# Patient Record
Sex: Male | Born: 1955 | Race: Black or African American | Hispanic: No | Marital: Married | Smoking: Former smoker
Health system: Southern US, Community
[De-identification: ages and names within clinical notes are randomized; demographics above are authoritative.]

## PROBLEM LIST (undated history)

## (undated) DIAGNOSIS — C801 Malignant (primary) neoplasm, unspecified: Secondary | ICD-10-CM

## (undated) DIAGNOSIS — N400 Enlarged prostate without lower urinary tract symptoms: Secondary | ICD-10-CM

## (undated) DIAGNOSIS — F419 Anxiety disorder, unspecified: Secondary | ICD-10-CM

## (undated) DIAGNOSIS — N390 Urinary tract infection, site not specified: Secondary | ICD-10-CM

## (undated) DIAGNOSIS — I10 Essential (primary) hypertension: Secondary | ICD-10-CM

## (undated) DIAGNOSIS — E785 Hyperlipidemia, unspecified: Secondary | ICD-10-CM

## (undated) DIAGNOSIS — N529 Male erectile dysfunction, unspecified: Secondary | ICD-10-CM

## (undated) DIAGNOSIS — G4730 Sleep apnea, unspecified: Secondary | ICD-10-CM

## (undated) DIAGNOSIS — R011 Cardiac murmur, unspecified: Secondary | ICD-10-CM

## (undated) DIAGNOSIS — E119 Type 2 diabetes mellitus without complications: Secondary | ICD-10-CM

## (undated) HISTORY — DX: Malignant (primary) neoplasm, unspecified: C80.1

## (undated) HISTORY — DX: Urinary tract infection, site not specified: N39.0

## (undated) HISTORY — DX: Cardiac murmur, unspecified: R01.1

## (undated) HISTORY — PX: COLONOSCOPY: SHX174

## (undated) HISTORY — DX: Anxiety disorder, unspecified: F41.9

## (undated) HISTORY — DX: Hyperlipidemia, unspecified: E78.5

---

## 1987-06-01 HISTORY — PX: FOOT BONE EXCISION: SUR493

## 2004-03-07 ENCOUNTER — Emergency Department (HOSPITAL_COMMUNITY): Admission: EM | Admit: 2004-03-07 | Discharge: 2004-03-07 | Payer: Self-pay

## 2004-05-31 DIAGNOSIS — C801 Malignant (primary) neoplasm, unspecified: Secondary | ICD-10-CM

## 2004-05-31 HISTORY — DX: Malignant (primary) neoplasm, unspecified: C80.1

## 2004-05-31 HISTORY — PX: BRAIN SURGERY: SHX531

## 2004-12-02 ENCOUNTER — Ambulatory Visit (HOSPITAL_BASED_OUTPATIENT_CLINIC_OR_DEPARTMENT_OTHER): Admission: RE | Admit: 2004-12-02 | Discharge: 2004-12-02 | Payer: Self-pay | Admitting: Family Medicine

## 2004-12-13 ENCOUNTER — Ambulatory Visit: Payer: Self-pay | Admitting: Internal Medicine

## 2005-04-30 ENCOUNTER — Ambulatory Visit: Admission: RE | Admit: 2005-04-30 | Discharge: 2005-07-21 | Payer: Self-pay | Admitting: Radiation Oncology

## 2005-07-16 ENCOUNTER — Ambulatory Visit (HOSPITAL_COMMUNITY): Admission: RE | Admit: 2005-07-16 | Discharge: 2005-07-16 | Payer: Self-pay | Admitting: Radiation Oncology

## 2005-08-16 ENCOUNTER — Ambulatory Visit: Admission: RE | Admit: 2005-08-16 | Discharge: 2005-08-27 | Payer: Self-pay | Admitting: Radiation Oncology

## 2005-09-16 ENCOUNTER — Ambulatory Visit: Admission: RE | Admit: 2005-09-16 | Discharge: 2005-10-11 | Payer: Self-pay | Admitting: Radiation Oncology

## 2005-09-17 ENCOUNTER — Ambulatory Visit (HOSPITAL_COMMUNITY): Admission: RE | Admit: 2005-09-17 | Discharge: 2005-09-17 | Payer: Self-pay | Admitting: Radiation Oncology

## 2005-09-17 LAB — CBC WITH DIFFERENTIAL/PLATELET
BASO%: 0.3 % (ref 0.0–2.0)
Eosinophils Absolute: 0.1 10*3/uL (ref 0.0–0.5)
MCH: 26.7 pg — ABNORMAL LOW (ref 28.0–33.4)
MCHC: 32.9 g/dL (ref 32.0–35.9)
MCV: 81.1 fL — ABNORMAL LOW (ref 81.6–98.0)
NEUT#: 3.3 10*3/uL (ref 1.5–6.5)
NEUT%: 65.4 % (ref 40.0–75.0)
RBC: 5.39 10*6/uL (ref 4.20–5.71)
RDW: 14.1 % (ref 11.2–14.6)

## 2005-09-27 LAB — COMPREHENSIVE METABOLIC PANEL
Alkaline Phosphatase: 40 U/L (ref 39–117)
CO2: 25 mEq/L (ref 19–32)
Chloride: 105 mEq/L (ref 96–112)
Glucose, Bld: 89 mg/dL (ref 70–99)
Sodium: 140 mEq/L (ref 135–145)
Total Bilirubin: 0.5 mg/dL (ref 0.3–1.2)

## 2005-09-28 LAB — CBC WITH DIFFERENTIAL/PLATELET
Basophils Absolute: 0 10*3/uL (ref 0.0–0.1)
EOS%: 0.9 % (ref 0.0–7.0)
Eosinophils Absolute: 0 10*3/uL (ref 0.0–0.5)
HCT: 44.2 % (ref 38.7–49.9)
LYMPH%: 22.9 % (ref 14.0–48.0)
MONO#: 0.3 10*3/uL (ref 0.1–0.9)
MONO%: 6.3 % (ref 0.0–13.0)
NEUT#: 3.8 10*3/uL (ref 1.5–6.5)
Platelets: 273 10*3/uL (ref 145–400)
WBC: 5.4 10*3/uL (ref 4.0–10.0)

## 2005-10-26 ENCOUNTER — Ambulatory Visit: Admission: RE | Admit: 2005-10-26 | Discharge: 2005-12-03 | Payer: Self-pay | Admitting: Radiation Oncology

## 2005-10-26 LAB — CBC WITH DIFFERENTIAL/PLATELET
EOS%: 1.1 % (ref 0.0–7.0)
LYMPH%: 20.9 % (ref 14.0–48.0)
MCH: 26.7 pg — ABNORMAL LOW (ref 28.0–33.4)
MCV: 80.8 fL — ABNORMAL LOW (ref 81.6–98.0)
MONO#: 0.6 10*3/uL (ref 0.1–0.9)
RBC: 5.28 10*6/uL (ref 4.20–5.71)
RDW: 14.9 % — ABNORMAL HIGH (ref 11.2–14.6)
WBC: 6.2 10*3/uL (ref 4.0–10.0)
lymph#: 1.3 10*3/uL (ref 0.9–3.3)

## 2005-11-22 LAB — COMPREHENSIVE METABOLIC PANEL
ALT: 30 U/L (ref 0–40)
Albumin: 4.7 g/dL (ref 3.5–5.2)
CO2: 27 mEq/L (ref 19–32)
Chloride: 104 mEq/L (ref 96–112)
Glucose, Bld: 91 mg/dL (ref 70–99)
Sodium: 141 mEq/L (ref 135–145)
Total Bilirubin: 0.4 mg/dL (ref 0.3–1.2)
Total Protein: 7 g/dL (ref 6.0–8.3)

## 2005-11-22 LAB — CBC WITH DIFFERENTIAL/PLATELET
BASO%: 0.3 % (ref 0.0–2.0)
Basophils Absolute: 0 10*3/uL (ref 0.0–0.1)
HCT: 44.2 % (ref 38.7–49.9)
MCH: 27.1 pg — ABNORMAL LOW (ref 28.0–33.4)
MCHC: 33.6 g/dL (ref 32.0–35.9)
MONO%: 10.4 % (ref 0.0–13.0)
RBC: 5.47 10*6/uL (ref 4.20–5.71)
RDW: 14.9 % — ABNORMAL HIGH (ref 11.2–14.6)
WBC: 4.9 10*3/uL (ref 4.0–10.0)
lymph#: 1.4 10*3/uL (ref 0.9–3.3)

## 2005-11-29 ENCOUNTER — Ambulatory Visit (HOSPITAL_COMMUNITY): Admission: RE | Admit: 2005-11-29 | Discharge: 2005-11-29 | Payer: Self-pay | Admitting: Radiation Oncology

## 2005-11-29 LAB — CBC WITH DIFFERENTIAL/PLATELET
EOS%: 0.8 % (ref 0.0–7.0)
MCHC: 33.9 g/dL (ref 32.0–35.9)
MCV: 80.4 fL — ABNORMAL LOW (ref 81.6–98.0)
MONO#: 0.5 10*3/uL (ref 0.1–0.9)
Platelets: 275 10*3/uL (ref 145–400)
lymph#: 1.3 10*3/uL (ref 0.9–3.3)

## 2005-12-21 ENCOUNTER — Ambulatory Visit: Admission: RE | Admit: 2005-12-21 | Discharge: 2006-01-19 | Payer: Self-pay | Admitting: Radiation Oncology

## 2005-12-21 LAB — HEPATIC FUNCTION PANEL
ALT: 30 U/L (ref 0–40)
AST: 21 U/L (ref 0–37)
Albumin: 4.7 g/dL (ref 3.5–5.2)
Bilirubin, Direct: 0.1 mg/dL (ref 0.0–0.3)
Total Bilirubin: 0.4 mg/dL (ref 0.3–1.2)

## 2005-12-21 LAB — CBC WITH DIFFERENTIAL/PLATELET
Basophils Absolute: 0 10*3/uL (ref 0.0–0.1)
Eosinophils Absolute: 0.1 10*3/uL (ref 0.0–0.5)
HGB: 15 g/dL (ref 13.0–17.1)
LYMPH%: 25.8 % (ref 14.0–48.0)
MCV: 81 fL — ABNORMAL LOW (ref 81.6–98.0)
NEUT#: 3.2 10*3/uL (ref 1.5–6.5)
NEUT%: 64.2 % (ref 40.0–75.0)
Platelets: 334 10*3/uL (ref 145–400)
RBC: 5.44 10*6/uL (ref 4.20–5.71)
RDW: 14.6 % (ref 11.2–14.6)
WBC: 5.1 10*3/uL (ref 4.0–10.0)
lymph#: 1.3 10*3/uL (ref 0.9–3.3)

## 2005-12-28 LAB — CBC WITH DIFFERENTIAL/PLATELET
Basophils Absolute: 0 10*3/uL (ref 0.0–0.1)
HCT: 42.7 % (ref 38.7–49.9)
LYMPH%: 25.4 % (ref 14.0–48.0)
MCH: 27.1 pg — ABNORMAL LOW (ref 28.0–33.4)
MONO#: 0.5 10*3/uL (ref 0.1–0.9)
NEUT#: 3.1 10*3/uL (ref 1.5–6.5)
NEUT%: 62.9 % (ref 40.0–75.0)
RDW: 14.9 % — ABNORMAL HIGH (ref 11.2–14.6)
WBC: 5 10*3/uL (ref 4.0–10.0)
lymph#: 1.3 10*3/uL (ref 0.9–3.3)

## 2006-07-02 ENCOUNTER — Emergency Department (HOSPITAL_COMMUNITY): Admission: EM | Admit: 2006-07-02 | Discharge: 2006-07-02 | Payer: Self-pay | Admitting: Emergency Medicine

## 2008-05-31 HISTORY — PX: OTHER SURGICAL HISTORY: SHX169

## 2009-03-20 DIAGNOSIS — E291 Testicular hypofunction: Secondary | ICD-10-CM | POA: Insufficient documentation

## 2009-03-24 DIAGNOSIS — M25561 Pain in right knee: Secondary | ICD-10-CM | POA: Insufficient documentation

## 2009-06-23 DIAGNOSIS — R3121 Asymptomatic microscopic hematuria: Secondary | ICD-10-CM | POA: Insufficient documentation

## 2010-06-17 DIAGNOSIS — Z008 Encounter for other general examination: Secondary | ICD-10-CM | POA: Insufficient documentation

## 2010-06-21 ENCOUNTER — Encounter: Payer: Self-pay | Admitting: Radiation Oncology

## 2010-12-25 ENCOUNTER — Ambulatory Visit: Payer: Self-pay | Admitting: Internal Medicine

## 2010-12-30 ENCOUNTER — Ambulatory Visit: Payer: Self-pay | Admitting: Internal Medicine

## 2011-01-29 ENCOUNTER — Ambulatory Visit: Payer: Self-pay | Admitting: Internal Medicine

## 2011-02-03 ENCOUNTER — Ambulatory Visit: Payer: Self-pay | Admitting: Internal Medicine

## 2011-03-01 ENCOUNTER — Ambulatory Visit: Payer: Self-pay | Admitting: Internal Medicine

## 2011-04-01 ENCOUNTER — Ambulatory Visit: Payer: Self-pay | Admitting: Internal Medicine

## 2011-05-01 ENCOUNTER — Ambulatory Visit: Payer: Self-pay | Admitting: Internal Medicine

## 2011-06-01 ENCOUNTER — Ambulatory Visit: Payer: Self-pay | Admitting: Internal Medicine

## 2011-07-06 ENCOUNTER — Ambulatory Visit: Payer: Self-pay | Admitting: Internal Medicine

## 2011-07-06 LAB — CBC CANCER CENTER
Basophil #: 0 x10 3/mm (ref 0.0–0.1)
Eosinophil #: 0 x10 3/mm (ref 0.0–0.7)
HCT: 49.4 % (ref 40.0–52.0)
Lymphocyte #: 1.9 x10 3/mm (ref 1.0–3.6)
MCHC: 32.3 g/dL (ref 32.0–36.0)
MCV: 79 fL — ABNORMAL LOW (ref 80–100)
Neutrophil #: 3.2 x10 3/mm (ref 1.4–6.5)
RDW: 15.1 % — ABNORMAL HIGH (ref 11.5–14.5)

## 2011-07-30 ENCOUNTER — Ambulatory Visit: Payer: Self-pay | Admitting: Oncology

## 2011-07-30 ENCOUNTER — Ambulatory Visit: Payer: Self-pay | Admitting: Internal Medicine

## 2011-08-06 LAB — CBC CANCER CENTER
Basophil %: 1.5 %
Eosinophil %: 0.5 %
HGB: 16.2 g/dL (ref 13.0–18.0)
Lymphocyte #: 2.1 x10 3/mm (ref 1.0–3.6)
MCH: 26 pg (ref 26.0–34.0)
MCV: 79 fL — ABNORMAL LOW (ref 80–100)
Monocyte #: 0.6 x10 3/mm (ref 0.0–0.7)
Neutrophil %: 58.9 %
RBC: 6.23 10*6/uL — ABNORMAL HIGH (ref 4.40–5.90)
WBC: 7 x10 3/mm (ref 3.8–10.6)

## 2011-08-28 ENCOUNTER — Emergency Department (INDEPENDENT_AMBULATORY_CARE_PROVIDER_SITE_OTHER)
Admission: EM | Admit: 2011-08-28 | Discharge: 2011-08-28 | Disposition: A | Discharge status: Home / Self Care | Source: Home / Self Care | Attending: Family Medicine | Admitting: Family Medicine

## 2011-08-28 ENCOUNTER — Encounter (HOSPITAL_COMMUNITY): Payer: Self-pay

## 2011-08-28 DIAGNOSIS — J069 Acute upper respiratory infection, unspecified: Secondary | ICD-10-CM

## 2011-08-28 LAB — POCT URINALYSIS DIP (DEVICE)
Bilirubin Urine: NEGATIVE
Glucose, UA: NEGATIVE mg/dL
Ketones, ur: NEGATIVE mg/dL
Specific Gravity, Urine: 1.025 (ref 1.005–1.030)

## 2011-08-28 NOTE — ED Provider Notes (Signed)
History     CSN: [id number]  Arrival date & time 08/28/11  1412   First MD Initiated Contact with Patient 08/28/11 1415      Chief Complaint  Patient presents with  . Influenza    (Consider location/radiation/quality/duration/timing/severity/associated sxs/prior treatment) HPI Comments: Leeandre presents for evaluation of feeling hot and cold, runny nose, minimal cough, and body aches. All week. He and his wife report, that they've been using over-the-counter preparations without much relief.  Patient is a 56 y.o. male presenting with URI. The history is provided by the patient.  URI The primary symptoms include fever, cough and myalgias. The current episode started 6 to 7 days ago. This is a new problem. The problem has not changed since onset. Symptoms associated with the illness include chills, congestion and rhinorrhea.    History reviewed. No pertinent past medical history.  Past Surgical History  Procedure Date  . Brain surgery     History reviewed. No pertinent family history.  History  Substance Use Topics  . Smoking status: Never Smoker   . Smokeless tobacco: Not on file  . Alcohol Use: No      Review of Systems  Constitutional: Positive for fever and chills.  HENT: Positive for congestion and rhinorrhea.   Eyes: Negative.   Respiratory: Positive for cough.   Cardiovascular: Negative.   Gastrointestinal: Negative.   Genitourinary: Negative.   Musculoskeletal: Positive for myalgias.  Skin: Negative.   Neurological: Negative.     Allergies  Review of patient's allergies indicates no known allergies.  Home Medications   Current Outpatient Rx  Name Route Sig Dispense Refill  . ASPIRIN 81 MG PO TABS Oral Take 81 mg by mouth daily.    . FENOFIBRATE 54 MG PO TABS Oral Take 54 mg by mouth daily.    . OMEGA-3 FATTY ACIDS 1000 MG PO CAPS Oral Take 2 g by mouth daily.    Marland Kitchen SIMVASTATIN 10 MG PO TABS Oral Take 10 mg by mouth at bedtime.    Marland Kitchen VALSARTAN 160 MG  PO TABS Oral Take 160 mg by mouth daily.      BP 125/79  Pulse 65  Temp(Src) 98.7 F (37.1 C) (Oral)  Resp 18  SpO2 100%  Physical Exam  Nursing note and vitals reviewed. Constitutional: He is oriented to person, place, and time. He appears well-developed and well-nourished.  HENT:  Head: Normocephalic and atraumatic.  Right Ear: Tympanic membrane is retracted.  Left Ear: Tympanic membrane is retracted.  Mouth/Throat: Uvula is midline, oropharynx is clear and moist and mucous membranes are normal.  Eyes: EOM are normal.  Neck: Normal range of motion.  Cardiovascular: Normal rate, regular rhythm, S1 normal, S2 normal and normal heart sounds.   No murmur heard. Pulmonary/Chest: Effort normal and breath sounds normal. He has no decreased breath sounds. He has no wheezes. He has no rhonchi.  Musculoskeletal: Normal range of motion.  Neurological: He is alert and oriented to person, place, and time.  Skin: Skin is warm and dry.  Psychiatric: His behavior is normal.    ED Course  Procedures (including critical care time)  Labs Reviewed  POCT URINALYSIS DIP (DEVICE) - Abnormal; Notable for the following:    Hgb urine dipstick MODERATE (*)    All other components within normal limits   No results found.   1. URI (upper respiratory infection)       MDM  Labs reviewed; advised to follow up with PCP regarding urinary sx; last prostate  exam was a year ago and he was told it was normal; continue supportive care for URI       Renaee Munda, MD 08/28/11 1640

## 2011-08-28 NOTE — Discharge Instructions (Signed)
Your urine showed a "moderate amount of hemoglobin." This may mean nothing. If your increased urination persists, please follow up with your PCP or urologist for for further evaluation. For your upper respiratory infection, I recommend aggressive fever control with acetaminophen (Tylenol) and/or ibuprofen. You may use these together, alternating them every 4 hours, or individually, every 8 hours. For example, take acetaminophen 500 to 1000 mg at 12 noon, then 600 to 800 mg of ibuprofen at 4 pm, then acetaminophen at 8 pm, etc. Also, stay hydrated with clear liquids. Return to care should your symptoms not improve, or worsen in any way.

## 2011-08-28 NOTE — ED Notes (Signed)
Pt has had cough, fever, runny nose, chills and bodyaches since Monday

## 2011-08-30 ENCOUNTER — Ambulatory Visit: Payer: Self-pay | Admitting: Internal Medicine

## 2011-08-30 ENCOUNTER — Ambulatory Visit: Payer: Self-pay | Admitting: Oncology

## 2011-09-06 ENCOUNTER — Encounter (HOSPITAL_COMMUNITY): Payer: Self-pay | Admitting: *Deleted

## 2011-09-06 ENCOUNTER — Emergency Department (HOSPITAL_COMMUNITY)
Admission: EM | Admit: 2011-09-06 | Discharge: 2011-09-06 | Disposition: A | Discharge status: Home / Self Care | Attending: Emergency Medicine | Admitting: Emergency Medicine

## 2011-09-06 DIAGNOSIS — S61012A Laceration without foreign body of left thumb without damage to nail, initial encounter: Secondary | ICD-10-CM

## 2011-09-06 DIAGNOSIS — I10 Essential (primary) hypertension: Secondary | ICD-10-CM | POA: Insufficient documentation

## 2011-09-06 DIAGNOSIS — Z7982 Long term (current) use of aspirin: Secondary | ICD-10-CM | POA: Insufficient documentation

## 2011-09-06 DIAGNOSIS — S61209A Unspecified open wound of unspecified finger without damage to nail, initial encounter: Secondary | ICD-10-CM | POA: Insufficient documentation

## 2011-09-06 DIAGNOSIS — W278XXA Contact with other nonpowered hand tool, initial encounter: Secondary | ICD-10-CM | POA: Insufficient documentation

## 2011-09-06 HISTORY — DX: Essential (primary) hypertension: I10

## 2011-09-06 MED ORDER — TETANUS-DIPHTH-ACELL PERTUSSIS 5-2.5-18.5 LF-MCG/0.5 IM SUSP
0.5000 mL | Freq: Once | INTRAMUSCULAR | Status: AC
  Administered 2011-09-06: 0.5 mL via INTRAMUSCULAR
  Filled 2011-09-06: qty 0.5

## 2011-09-06 NOTE — ED Notes (Signed)
First meeting with patient. Patient resting with NAD and waiting discharge.

## 2011-09-06 NOTE — ED Notes (Signed)
Patient complaining of laceration to left thumb; patient states that he was cutting a box open when the knife slipped and cut his finger at 10 am this morning.  Gauze removed by PA (now at bedside).  Patient does not know last tetanus shot date.  1 inch laceration noted to left thumb; no active bleeding noted.  Laceration is scabbed over and closed-up.  Patient alert and oriented x4; PERRL present.  Will continue to monitor.

## 2011-09-06 NOTE — ED Notes (Signed)
Dermabond placed at bedside for PA use; PA notified and aware.

## 2011-09-06 NOTE — Discharge Instructions (Signed)
Stitches, Staples, or Skin Adhesive Strips   Stitches (sutures), staples, and skin adhesive strips hold the skin together as it heals. They will usually be in place for 7 days or less.  HOME CARE   Wash your hands with soap and water before and after you touch your wound.   Only take medicine as told by your doctor.   Cover your wound only if your doctor told you to. Otherwise, leave it open to air.   Do not get your stitches wet or dirty. If they get dirty, dab them gently with a clean washcloth. Wet the washcloth with soapy water. Do not rub. Pat them dry gently.   Do not put medicine or medicated cream on your stitches unless your doctor told you to.   Do not take out your own stitches or staples. Skin adhesive strips will fall off by themselves.   Do not pick at the wound. Picking can cause an infection.   Do not miss your follow-up appointment.   If you have problems or questions, call your doctor.  GET HELP RIGHT AWAY IF:    You have a temperature by mouth above 102 F (38.9 C), not controlled by medicine.   You have chills.   You have redness or pain around your stitches.   There is puffiness (swelling) around your stitches.   You notice fluid (drainage) from your stitches.   There is a bad smell coming from your wound.  MAKE SURE YOU:   Understand these instructions.   Will watch your condition.   Will get help if you are not doing well or get worse.  Document Released: 03/14/2009 Document Revised: 05/06/2011 Document Reviewed: 03/14/2009  [employer] Patient Information 2012 [employer].

## 2011-09-06 NOTE — ED Provider Notes (Signed)
History     CSN: [id number]  Arrival date & time 09/06/11  2047   First MD Initiated Contact with Patient 09/06/11 2239      Chief Complaint  Patient presents with  . Laceration    (Consider location/radiation/quality/duration/timing/severity/associated sxs/prior treatment) HPI Comments: Patient here with left thumb laceration - states that the site has continued to bleed since then - occurred about 14 hours ago - denies decrease in sensation or movement - states had a meeting this morning so he did not come in sooner - tetanus is unknown  Patient is a 56 y.o. male presenting with skin laceration. The history is provided by the patient. No language interpreter was used.  Laceration  The incident occurred 6 to 12 hours ago. Pain location: left thumb. The laceration is 1 cm in size. The laceration mechanism was a a razor. The pain is at a severity of 2/10. The pain is mild. The pain has been constant since onset. He reports no foreign bodies present. His tetanus status is out of date.    Past Medical History  Diagnosis Date  . Hypertension     Past Surgical History  Procedure Date  . Brain surgery     No family history on file.  History  Substance Use Topics  . Smoking status: Never Smoker   . Smokeless tobacco: Not on file  . Alcohol Use: No      Review of Systems  Skin: Positive for wound.  All other systems reviewed and are negative.    Allergies  Review of patient's allergies indicates no known allergies.  Home Medications   Current Outpatient Rx  Name Route Sig Dispense Refill  . ASPIRIN 81 MG PO TABS Oral Take 81 mg by mouth daily.    . FENOFIBRATE 54 MG PO TABS Oral Take 54 mg by mouth daily.    . OMEGA-3 FATTY ACIDS 1000 MG PO CAPS Oral Take 2 g by mouth daily.    Marland Kitchen SIMVASTATIN 10 MG PO TABS Oral Take 10 mg by mouth at bedtime.    Marland Kitchen VALSARTAN 160 MG PO TABS Oral Take 160 mg by mouth daily.      BP 145/83  Pulse 82  Temp(Src) 98.6 F (37 C) (Oral)   Resp 17  SpO2 95%  Physical Exam  Nursing note and vitals reviewed. Constitutional: He is oriented to person, place, and time. He appears well-developed and well-nourished. No distress.  HENT:  Head: Normocephalic and atraumatic.  Right Ear: External ear normal.  Left Ear: External ear normal.  Nose: Nose normal.  Mouth/Throat: Oropharynx is clear and moist. No oropharyngeal exudate.  Eyes: Conjunctivae are normal. Pupils are equal, round, and reactive to light. No scleral icterus.  Neck: Normal range of motion. Neck supple.  Cardiovascular: Normal rate, regular rhythm and normal heart sounds.  Exam reveals no gallop and no friction rub.   No murmur heard. Pulmonary/Chest: Effort normal and breath sounds normal. No respiratory distress. He has no wheezes. He has no rales. He exhibits no tenderness.  Abdominal: Soft. Bowel sounds are normal. He exhibits no distension. There is no tenderness.  Musculoskeletal: Normal range of motion. He exhibits no edema and no tenderness.  Lymphadenopathy:    He has no cervical adenopathy.  Neurological: He is alert and oriented to person, place, and time. No cranial nerve deficit.  Skin: Skin is warm and dry. No rash noted. No erythema. No pallor.       1cm laceration to left thumb  pad - hemostatic at this time.  Psychiatric: He has a normal mood and affect. His behavior is normal. Judgment and thought content normal.    ED Course  Procedures (including critical care time)  Labs Reviewed - No data to display No results found.  LACERATION REPAIR Performed by: Patrecia Pour. Authorized by: Patrecia Pour Consent: Verbal consent obtained. Risks and benefits: risks, benefits and alternatives were discussed Consent given by: patient Patient identity confirmed: provided demographic data Prepped and Draped in normal sterile fashion Wound explored  Laceration Location: left thumb  Laceration Length: 1cm  No Foreign Bodies seen or  palpated  Anesthesia: none  Local anesthetic: none  Anesthetic total: none  Irrigation method: syringe Amount of cleaning: standard  Skin closure: dermabond and steristrips  Number of sutures: 4 1/8" steristrips  Technique: N/A  Patient tolerance: Patient tolerated the procedure well with no immediate complications.  Left thumb laceration    MDM  Patient with delayed closure of left thumb laceration - because of this and the fact that the wound is hemostatic I chose to place dermabond over the wound and place steristrips to help keep the area closed - tetanus updated.        Izola Price Bricelyn, [place] 09/06/11 2328

## 2011-09-06 NOTE — ED Notes (Signed)
The pt cut his lt thumb with a razor blade this am 1000am.  The cut has continued to bleed since then

## 2011-09-07 NOTE — ED Provider Notes (Signed)
Medical screening examination/treatment/procedure(s) were performed by non-physician practitioner and as supervising physician I was immediately available for consultation/collaboration.  Serapio Edelson R. Ryliegh Mcduffey, MD 09/07/11 2304 

## 2011-11-05 ENCOUNTER — Ambulatory Visit: Payer: Self-pay | Admitting: Oncology

## 2011-11-05 LAB — CANCER CENTER HEMOGLOBIN: HGB: 17.4 g/dL (ref 13.0–18.0)

## 2011-11-29 ENCOUNTER — Ambulatory Visit: Payer: Self-pay | Admitting: Oncology

## 2011-12-29 ENCOUNTER — Ambulatory Visit (INDEPENDENT_AMBULATORY_CARE_PROVIDER_SITE_OTHER): Admitting: *Deleted

## 2011-12-29 DIAGNOSIS — H3560 Retinal hemorrhage, unspecified eye: Secondary | ICD-10-CM | POA: Insufficient documentation

## 2011-12-29 DIAGNOSIS — H34219 Partial retinal artery occlusion, unspecified eye: Secondary | ICD-10-CM

## 2011-12-29 NOTE — Procedures (Unsigned)
CAROTID DUPLEX EXAM  INDICATION:  Retinal hemorrhage, left eye.  HISTORY: Diabetes:  No. Cardiac:  No. Hypertension:  Yes. Smoking:  Previously. Previous Surgery:  Brain surgery, cancerous tumor removed 2006. CV History:  Asymptomatic. Amaurosis Fugax No, Paresthesias No, Hemiparesis No                                      RIGHT             LEFT Brachial systolic pressure:         110               120 Brachial Doppler waveforms:         Within normal limits                Within normal limits Vertebral direction of flow:        Antegrade         Antegrade DUPLEX VELOCITIES (cm/sec) CCA peak systolic                   140               146 ECA peak systolic                   88                91 ICA peak systolic                   70                96 ICA end diastolic                   30                27 PLAQUE MORPHOLOGY:                  Heterogeneous     Heterogeneous PLAQUE AMOUNT:                      Minimal           Mild PLAQUE LOCATION:                    ICA, ECA          CCA  IMPRESSION: 1. 1-39% bilateral internal carotid artery stenosis. 2. Vertebral artery flow antegrade bilaterally.  ___________________________________________ Di Kindle. Edilia Bo, M.D.  SS/MEDQ  D:  12/29/2011  T:  12/29/2011  Job:  [id number]

## 2012-02-14 ENCOUNTER — Ambulatory Visit: Payer: Self-pay | Admitting: Oncology

## 2012-02-14 LAB — CBC CANCER CENTER
Eosinophil #: 0.1 x10 3/mm (ref 0.0–0.7)
HCT: 45.4 % (ref 40.0–52.0)
HGB: 14.5 g/dL (ref 13.0–18.0)
Lymphocyte %: 32.4 %
Monocyte #: 0.5 x10 3/mm (ref 0.2–1.0)
RDW: 15.6 % — ABNORMAL HIGH (ref 11.5–14.5)
WBC: 5.7 x10 3/mm (ref 3.8–10.6)

## 2012-02-29 ENCOUNTER — Ambulatory Visit: Payer: Self-pay | Admitting: Oncology

## 2012-03-10 ENCOUNTER — Other Ambulatory Visit: Payer: Self-pay | Admitting: Internal Medicine

## 2012-03-10 DIAGNOSIS — E041 Nontoxic single thyroid nodule: Secondary | ICD-10-CM

## 2012-03-15 ENCOUNTER — Other Ambulatory Visit

## 2012-03-17 ENCOUNTER — Ambulatory Visit
Admission: RE | Admit: 2012-03-17 | Discharge: 2012-03-17 | Disposition: A | Discharge status: Home / Self Care | Source: Ambulatory Visit | Attending: Internal Medicine | Admitting: Internal Medicine

## 2012-03-17 DIAGNOSIS — E041 Nontoxic single thyroid nodule: Secondary | ICD-10-CM

## 2012-03-20 ENCOUNTER — Other Ambulatory Visit: Payer: Self-pay | Admitting: Internal Medicine

## 2012-03-20 DIAGNOSIS — E041 Nontoxic single thyroid nodule: Secondary | ICD-10-CM

## 2012-03-30 ENCOUNTER — Other Ambulatory Visit (HOSPITAL_COMMUNITY)
Admission: RE | Admit: 2012-03-30 | Discharge: 2012-03-30 | Disposition: A | Discharge status: Home / Self Care | Source: Ambulatory Visit | Attending: Interventional Radiology | Admitting: Interventional Radiology

## 2012-03-30 ENCOUNTER — Ambulatory Visit
Admission: RE | Admit: 2012-03-30 | Discharge: 2012-03-30 | Disposition: A | Discharge status: Home / Self Care | Source: Ambulatory Visit | Attending: Internal Medicine | Admitting: Internal Medicine

## 2012-03-30 DIAGNOSIS — E049 Nontoxic goiter, unspecified: Secondary | ICD-10-CM | POA: Insufficient documentation

## 2012-03-30 DIAGNOSIS — E041 Nontoxic single thyroid nodule: Secondary | ICD-10-CM

## 2012-04-26 ENCOUNTER — Encounter (INDEPENDENT_AMBULATORY_CARE_PROVIDER_SITE_OTHER): Payer: Self-pay | Admitting: Surgery

## 2012-04-26 ENCOUNTER — Ambulatory Visit (INDEPENDENT_AMBULATORY_CARE_PROVIDER_SITE_OTHER): Admitting: Surgery

## 2012-04-26 VITALS — BP 128/88 | HR 66 | Temp 97.1°F | Resp 16 | Ht 71.0 in | Wt 240.6 lb

## 2012-04-26 DIAGNOSIS — D440 Neoplasm of uncertain behavior of thyroid gland: Secondary | ICD-10-CM | POA: Insufficient documentation

## 2012-04-26 DIAGNOSIS — D449 Neoplasm of uncertain behavior of unspecified endocrine gland: Secondary | ICD-10-CM

## 2012-04-26 NOTE — Progress Notes (Signed)
General Surgery - [facility]  Chief Complaint  Patient presents with  . New Evaluation    Evaluate left thyroid nodule with atypia - referral from Dr. Daniel Paterson    HISTORY: Patient is a 56-year-old male referred by his primary care physician for newly diagnosed thyroid neoplasm. Patient had undergone a carotid duplex study with an incidental finding of a thyroid nodule. In October 2013 he underwent a thyroid ultrasound. This showed a complex lesion in the left thyroid lobe measuring 3.6 cm in maximum dimension. Right thyroid lobe was normal. Patient underwent ultrasound-guided fine needle aspiration biopsy. This showed nuclear overlap with cytologic atypia. Patient is now referred for consideration of excision of left thyroid lobe for definitive diagnosis.  Patient has no prior history of thyroid disease. He is never been on thyroid medication. There is no family history of thyroid disease and specifically no history of thyroid cancer. There is no history of other endocrinopathy.  Patient has had no prior neck surgery. He has undergone a craniotomy in the past but at [facility] in 2006. He continues to do well from that procedure.  Past Medical History  Diagnosis Date  . Hypertension   . Cancer 2006    Brain tumor  . Heart murmur     History of heart murmur  . Hyperlipidemia      Current Outpatient Prescriptions  Medication Sig Dispense Refill  . aspirin 81 MG tablet Take 81 mg by mouth daily.      . fenofibrate 54 MG tablet Take 54 mg by mouth daily.      . fish oil-omega-3 fatty acids 1000 MG capsule Take 2 g by mouth daily.      . simvastatin (ZOCOR) 10 MG tablet Take 10 mg by mouth at bedtime.      . valsartan (DIOVAN) 160 MG tablet Take 160 mg by mouth daily.         No Known Allergies   Family History  Problem Relation Age of Onset  . Cancer Mother     Breast Cancer  . Cancer Brother 77     History   Social History   . Marital Status: Married    Spouse Name: N/A    Number of Children: N/A  . Years of Education: N/A   Social History Main Topics  . Smoking status: Former Smoker    Quit date: 04/26/1996  . Smokeless tobacco: None  . Alcohol Use: No  . Drug Use: None  . Sexually Active: None   Other Topics Concern  . None   Social History Narrative  . None     REVIEW OF SYSTEMS - PERTINENT POSITIVES ONLY: Denies tremors. Denies palpitations. Denies masses. Denies compressive symptoms.  EXAM: Filed Vitals:   04/26/12 0937  BP: 128/88  Pulse: 66  Temp: 97.1 F (36.2 C)  Resp: 16    HEENT: Well-healed craniotomy incision left parietal scalp; normocephalic; pupils equal and reactive; sclerae clear; dentition good; mucous membranes moist NECK:  Palpable soft mass mid to inferior left thyroid lobe, approximately 2-3 cm in size, mobile with swallowing; symmetric on extension; no palpable anterior or posterior cervical lymphadenopathy; no supraclavicular masses; no tenderness CHEST: clear to auscultation bilaterally without rales, rhonchi, or wheezes CARDIAC: regular rate and rhythm without significant murmur; peripheral pulses are full EXT:  non-tender without edema; no deformity NEURO: no gross focal deficits; no sign of tremor   LABORATORY RESULTS: See [facility] (CHL-Epic) for most recent results     RADIOLOGY RESULTS: See [facility] (CHL-Epic) for most recent results   IMPRESSION: Left thyroid nodule with cytologic atypia, 3.6 cm  PLAN: The patient and I discussed all of the above findings and reviewed the above studies together. I have recommended left thyroid lobectomy for definitive diagnosis. I believe there is approximately a 25% chance of malignancy. We discussed the procedure, the hospital stay, in the postoperative recovery. We discussed the potential for completion thyroidectomy in the event of malignancy. We discussed potential complications including injury to  recurrent laryngeal nerve and injury to parathyroid glands. Patient understands and wishes to proceed with surgery in the near future.  The risks and benefits of the procedure have been discussed at length with the patient.  The patient understands the proposed procedure, potential alternative treatments, and the course of recovery to be expected.  All of the patient's questions have been answered at this time.  The patient wishes to proceed with surgery.  Dyanne Yorks M. Trejan Buda, MD, FACS General & Endocrine [facility]   Visit Diagnoses: 1. Neoplasm of uncertain behavior of thyroid gland, left lobe     Primary Care Physician: PATERSON,DANIEL G, MD   

## 2012-04-26 NOTE — Patient Instructions (Signed)
  THYROID & PARATHYROID SURGERY - POST OP INSTRUCTIONS  Always review your discharge instruction sheet from the facility where your surgery was performed.  A prescription for pain medication may be given to you upon discharge.  Take your pain medication as prescribed.  If narcotic pain medicine is not needed, then you may take acetaminophen (Tylenol) or ibuprofen (Advil) as needed.  Take your usually prescribed medications unless otherwise directed.  If you need a refill on your pain medication, please contact your pharmacy. They will contact our office to request authorization.  Prescriptions will not be processed after 5 pm or on weekends.  Start with a light diet upon arrival home, such as soup and crackers or toast.  Be sure to drink plenty of fluids daily.  Resume your normal diet the day after surgery.  Most patients will experience some swelling and bruising on the chest and neck area.  Ice packs will help.  Swelling and bruising can take several days to resolve.   It is common to experience some constipation if taking pain medication after surgery.  Increasing fluid intake and taking a stool softener will usually help or prevent this problem.  A mild laxative (Milk of Magnesia or Miralax) should be taken according to package directions if there are no bowel movements after 48 hours.  You may remove your bandages 24-48 hours after surgery, and you may shower at that time.  You have steri-strips (small skin tapes) in place directly over the incision.  These strips should be left on the skin for 7-10 days and then removed.  You may resume regular (light) daily activities beginning the next day-such as daily self-care, walking, climbing stairs-gradually increasing activities as tolerated.  You may have sexual intercourse when it is comfortable.  Refrain from any heavy lifting or straining until approved by your doctor.  You may drive when you no longer are taking prescription pain medication,  you can comfortably wear a seatbelt, and you can safely maneuver your car and apply brakes.  You should see your doctor in the office for a follow-up appointment approximately two to three weeks after your surgery.  Make sure that you call for this appointment within a day or two after you arrive home to insure a convenient appointment time.  WHEN TO CALL YOUR DOCTOR: 1. Fever greater than 101.5 2. Inability to urinate 3. Nausea and/or vomiting - persistent 4. Extreme swelling or bruising 5. Continued bleeding from incision 6. Increased pain, redness, or drainage from the incision 7. Difficulty swallowing or breathing 8. Muscle cramping or spasms 9. Numbness or tingling in hands or around lips  The clinic staff is available to answer your questions during regular business hours.  Please don't hesitate to call and ask to speak to one of the nurses if you have concerns.  [email or web address]  [facility]     Office: [phone]  

## 2012-05-05 ENCOUNTER — Telehealth (INDEPENDENT_AMBULATORY_CARE_PROVIDER_SITE_OTHER): Payer: Self-pay

## 2012-05-05 ENCOUNTER — Encounter (HOSPITAL_COMMUNITY): Payer: Self-pay | Admitting: Pharmacy Technician

## 2012-05-05 NOTE — Telephone Encounter (Signed)
Pt notified of po appt.

## 2012-05-10 ENCOUNTER — Encounter (HOSPITAL_COMMUNITY): Payer: Self-pay

## 2012-05-10 ENCOUNTER — Encounter (HOSPITAL_COMMUNITY)
Admission: RE | Admit: 2012-05-10 | Discharge: 2012-05-10 | Disposition: A | Discharge status: Home / Self Care | Source: Ambulatory Visit | Attending: Surgery | Admitting: Surgery

## 2012-05-10 ENCOUNTER — Ambulatory Visit (HOSPITAL_COMMUNITY)
Admission: RE | Admit: 2012-05-10 | Discharge: 2012-05-10 | Disposition: A | Discharge status: Home / Self Care | Source: Ambulatory Visit | Attending: Surgery | Admitting: Surgery

## 2012-05-10 DIAGNOSIS — E785 Hyperlipidemia, unspecified: Secondary | ICD-10-CM | POA: Insufficient documentation

## 2012-05-10 DIAGNOSIS — Z7982 Long term (current) use of aspirin: Secondary | ICD-10-CM | POA: Insufficient documentation

## 2012-05-10 DIAGNOSIS — I10 Essential (primary) hypertension: Secondary | ICD-10-CM | POA: Insufficient documentation

## 2012-05-10 DIAGNOSIS — Z79899 Other long term (current) drug therapy: Secondary | ICD-10-CM | POA: Insufficient documentation

## 2012-05-10 DIAGNOSIS — Z01810 Encounter for preprocedural cardiovascular examination: Secondary | ICD-10-CM | POA: Insufficient documentation

## 2012-05-10 DIAGNOSIS — Z01812 Encounter for preprocedural laboratory examination: Secondary | ICD-10-CM | POA: Insufficient documentation

## 2012-05-10 DIAGNOSIS — E041 Nontoxic single thyroid nodule: Secondary | ICD-10-CM | POA: Insufficient documentation

## 2012-05-10 HISTORY — DX: Sleep apnea, unspecified: G47.30

## 2012-05-10 LAB — BASIC METABOLIC PANEL
BUN: 14 mg/dL (ref 6–23)
CO2: 26 mEq/L (ref 19–32)
Calcium: 9.7 mg/dL (ref 8.4–10.5)
Glucose, Bld: 156 mg/dL — ABNORMAL HIGH (ref 70–99)
Sodium: 139 mEq/L (ref 135–145)

## 2012-05-10 LAB — CBC
HCT: 45.6 % (ref 39.0–52.0)
Hemoglobin: 15.6 g/dL (ref 13.0–17.0)
MCH: 27.1 pg (ref 26.0–34.0)
MCV: 79.2 fL (ref 78.0–100.0)
RBC: 5.76 MIL/uL (ref 4.22–5.81)

## 2012-05-10 LAB — SURGICAL PCR SCREEN: MRSA, PCR: NEGATIVE

## 2012-05-10 NOTE — Patient Instructions (Addendum)
20 Randall Lewis  05/10/2012   Your procedure is scheduled on: 05/12/12  Report to [facility] at 0700AM.  Call this number if you have problems the morning of surgery 336-: [phone]   Remember: please bring CPAP mask and tubing   Do not eat food or drink liquids After Midnight.     Take these medicines the morning of surgery with A SIP OF WATER: zocor   Do not wear jewelry, make-up or nail polish.  Do not wear lotions, powders, or perfumes. You may wear deodorant.  Do not shave 48 hours prior to surgery. Men may shave face and neck.  Do not bring valuables to the hospital.  Contacts, dentures or bridgework may not be worn into surgery.  Leave suitcase in the car. After surgery it may be brought to your room.  For patients admitted to the hospital, checkout time is 11:00 AM the day of discharge.     Special Instructions: Shower using CHG 2 nights before surgery and the night before surgery.  If you shower the day of surgery use CHG.  Use special wash - you have one bottle of CHG for all showers.  You should use approximately 1/3 of the bottle for each shower.   Please read over the following fact sheets that you were given: MRSA Information.  [name], RN  pre op nurse call if needed [phone]    FAILURE TO FOLLOW THESE INSTRUCTIONS MAY RESULT IN CANCELLATION OF YOUR SURGERY   Patient Signature: ___________________________________________

## 2012-05-11 NOTE — Progress Notes (Signed)
Quick Note:  These results are acceptable for scheduled surgery.  Randall Vickers M. Brindley Madarang, MD, FACS [facility] Office: [phone]   ______ 

## 2012-05-12 ENCOUNTER — Ambulatory Visit (HOSPITAL_COMMUNITY): Admitting: Anesthesiology

## 2012-05-12 ENCOUNTER — Encounter (HOSPITAL_COMMUNITY): Payer: Self-pay | Admitting: Anesthesiology

## 2012-05-12 ENCOUNTER — Observation Stay (HOSPITAL_COMMUNITY)
Admission: RE | Admit: 2012-05-12 | Discharge: 2012-05-13 | Disposition: A | Discharge status: Home / Self Care | Source: Ambulatory Visit | Attending: Surgery | Admitting: Surgery

## 2012-05-12 ENCOUNTER — Encounter (HOSPITAL_COMMUNITY)
Admission: RE | Disposition: A | Discharge status: Home / Self Care | Payer: Self-pay | Source: Ambulatory Visit | Attending: Surgery

## 2012-05-12 ENCOUNTER — Encounter (HOSPITAL_COMMUNITY): Payer: Self-pay | Admitting: *Deleted

## 2012-05-12 DIAGNOSIS — D440 Neoplasm of uncertain behavior of thyroid gland: Secondary | ICD-10-CM | POA: Diagnosis present

## 2012-05-12 DIAGNOSIS — Z7982 Long term (current) use of aspirin: Secondary | ICD-10-CM | POA: Insufficient documentation

## 2012-05-12 DIAGNOSIS — Z85841 Personal history of malignant neoplasm of brain: Secondary | ICD-10-CM | POA: Insufficient documentation

## 2012-05-12 DIAGNOSIS — D34 Benign neoplasm of thyroid gland: Principal | ICD-10-CM | POA: Insufficient documentation

## 2012-05-12 DIAGNOSIS — Z79899 Other long term (current) drug therapy: Secondary | ICD-10-CM | POA: Insufficient documentation

## 2012-05-12 DIAGNOSIS — E785 Hyperlipidemia, unspecified: Secondary | ICD-10-CM | POA: Insufficient documentation

## 2012-05-12 DIAGNOSIS — I10 Essential (primary) hypertension: Secondary | ICD-10-CM | POA: Insufficient documentation

## 2012-05-12 HISTORY — PX: THYROID LOBECTOMY: SHX420

## 2012-05-12 SURGERY — LOBECTOMY, THYROID
Anesthesia: General | Site: Neck | Laterality: Left | Wound class: Clean

## 2012-05-12 MED ORDER — PROPOFOL 10 MG/ML IV BOLUS
INTRAVENOUS | Status: DC | PRN
  Administered 2012-05-12: 180 mg via INTRAVENOUS

## 2012-05-12 MED ORDER — MIDAZOLAM HCL 5 MG/5ML IJ SOLN
INTRAMUSCULAR | Status: DC | PRN
  Administered 2012-05-12: 2 mg via INTRAVENOUS

## 2012-05-12 MED ORDER — IRBESARTAN 150 MG PO TABS
150.0000 mg | ORAL_TABLET | Freq: Every day | ORAL | Status: DC
  Administered 2012-05-12 – 2012-05-13 (×2): 150 mg via ORAL
  Filled 2012-05-12 (×2): qty 1

## 2012-05-12 MED ORDER — CEFAZOLIN SODIUM-DEXTROSE 2-3 GM-% IV SOLR
2.0000 g | INTRAVENOUS | Status: AC
  Administered 2012-05-12: 2 g via INTRAVENOUS

## 2012-05-12 MED ORDER — HYDROMORPHONE HCL PF 1 MG/ML IJ SOLN
1.0000 mg | INTRAMUSCULAR | Status: DC | PRN
  Administered 2012-05-12 (×2): 1 mg via INTRAVENOUS
  Filled 2012-05-12 (×2): qty 1

## 2012-05-12 MED ORDER — HYDROMORPHONE HCL PF 1 MG/ML IJ SOLN
0.2500 mg | INTRAMUSCULAR | Status: DC | PRN
  Administered 2012-05-12 (×4): 0.5 mg via INTRAVENOUS

## 2012-05-12 MED ORDER — SUCCINYLCHOLINE CHLORIDE 20 MG/ML IJ SOLN
INTRAMUSCULAR | Status: DC | PRN
  Administered 2012-05-12: 100 mg via INTRAVENOUS

## 2012-05-12 MED ORDER — ROCURONIUM BROMIDE 100 MG/10ML IV SOLN
INTRAVENOUS | Status: DC | PRN
  Administered 2012-05-12: 30 mg via INTRAVENOUS
  Administered 2012-05-12: 10 mg via INTRAVENOUS

## 2012-05-12 MED ORDER — HYDROCODONE-ACETAMINOPHEN 5-325 MG PO TABS
1.0000 | ORAL_TABLET | ORAL | Status: DC | PRN
  Administered 2012-05-12 – 2012-05-13 (×2): 2 via ORAL
  Filled 2012-05-12 (×2): qty 2

## 2012-05-12 MED ORDER — FENTANYL CITRATE 0.05 MG/ML IJ SOLN
INTRAMUSCULAR | Status: DC | PRN
  Administered 2012-05-12: 100 ug via INTRAVENOUS
  Administered 2012-05-12 (×2): 50 ug via INTRAVENOUS

## 2012-05-12 MED ORDER — MENTHOL 3 MG MT LOZG
1.0000 | LOZENGE | OROMUCOSAL | Status: DC | PRN
Start: 2012-05-12 — End: 2012-05-13
  Administered 2012-05-13: 3 mg via ORAL
  Filled 2012-05-12: qty 9

## 2012-05-12 MED ORDER — MEPERIDINE HCL 50 MG/ML IJ SOLN: 6.2500 mg | INTRAMUSCULAR | Status: DC | PRN

## 2012-05-12 MED ORDER — NEOSTIGMINE METHYLSULFATE 1 MG/ML IJ SOLN
INTRAMUSCULAR | Status: DC | PRN
  Administered 2012-05-12: 4 mg via INTRAVENOUS

## 2012-05-12 MED ORDER — ACETAMINOPHEN 10 MG/ML IV SOLN
INTRAVENOUS | Status: DC | PRN
  Administered 2012-05-12: 1000 mg via INTRAVENOUS

## 2012-05-12 MED ORDER — ACETAMINOPHEN 10 MG/ML IV SOLN
INTRAVENOUS | Status: AC
  Filled 2012-05-12: qty 100

## 2012-05-12 MED ORDER — PROMETHAZINE HCL 25 MG/ML IJ SOLN: 6.2500 mg | INTRAMUSCULAR | Status: DC | PRN

## 2012-05-12 MED ORDER — ONDANSETRON HCL 4 MG/2ML IJ SOLN: 4.0000 mg | Freq: Four times a day (QID) | INTRAMUSCULAR | Status: DC | PRN

## 2012-05-12 MED ORDER — LACTATED RINGERS IV SOLN
INTRAVENOUS | Status: DC | PRN
  Administered 2012-05-12 (×2): via INTRAVENOUS

## 2012-05-12 MED ORDER — HYDROMORPHONE HCL PF 1 MG/ML IJ SOLN
INTRAMUSCULAR | Status: AC
  Administered 2012-05-12: 1 mg via INTRAVENOUS
  Filled 2012-05-12: qty 1

## 2012-05-12 MED ORDER — CEFAZOLIN SODIUM-DEXTROSE 2-3 GM-% IV SOLR
INTRAVENOUS | Status: AC
  Filled 2012-05-12: qty 50

## 2012-05-12 MED ORDER — INFLUENZA VIRUS VACC SPLIT PF IM SUSP
0.5000 mL | INTRAMUSCULAR | Status: AC
  Administered 2012-05-13: 0.5 mL via INTRAMUSCULAR
  Filled 2012-05-12: qty 0.5

## 2012-05-12 MED ORDER — LACTATED RINGERS IV SOLN: INTRAVENOUS | Status: DC

## 2012-05-12 MED ORDER — ONDANSETRON HCL 4 MG/2ML IJ SOLN
INTRAMUSCULAR | Status: DC | PRN
  Administered 2012-05-12: 4 mg via INTRAVENOUS

## 2012-05-12 MED ORDER — GLYCOPYRROLATE 0.2 MG/ML IJ SOLN
INTRAMUSCULAR | Status: DC | PRN
  Administered 2012-05-12: .7 mg via INTRAVENOUS

## 2012-05-12 MED ORDER — ONDANSETRON HCL 4 MG PO TABS: 4.0000 mg | ORAL_TABLET | Freq: Four times a day (QID) | ORAL | Status: DC | PRN

## 2012-05-12 MED ORDER — KCL IN DEXTROSE-NACL 20-5-0.45 MEQ/L-%-% IV SOLN
INTRAVENOUS | Status: DC
  Administered 2012-05-12: 14:00:00 via INTRAVENOUS
  Administered 2012-05-13: 75 mL via INTRAVENOUS
  Filled 2012-05-12 (×3): qty 1000

## 2012-05-12 MED ORDER — HYDROCODONE-ACETAMINOPHEN 5-325 MG PO TABS: 1.0000 | ORAL_TABLET | ORAL | Status: DC | PRN

## 2012-05-12 MED ORDER — LIDOCAINE HCL (CARDIAC) 20 MG/ML IV SOLN
INTRAVENOUS | Status: DC | PRN
  Administered 2012-05-12: 50 mg via INTRAVENOUS

## 2012-05-12 MED ORDER — BUPIVACAINE HCL (PF) 0.25 % IJ SOLN
INTRAMUSCULAR | Status: AC
  Filled 2012-05-12: qty 30

## 2012-05-12 MED ORDER — 0.9 % SODIUM CHLORIDE (POUR BTL) OPTIME
TOPICAL | Status: DC | PRN
  Administered 2012-05-12: 1000 mL

## 2012-05-12 SURGICAL SUPPLY — 40 items
APL SKNCLS STERI-STRIP NONHPOA (GAUZE/BANDAGES/DRESSINGS) ×1
ATTRACTOMAT 16X20 MAGNETIC DRP (DRAPES) ×2 IMPLANT
BENZOIN TINCTURE PRP APPL 2/3 (GAUZE/BANDAGES/DRESSINGS) ×2 IMPLANT
BLADE HEX COATED 2.75 (ELECTRODE) ×2 IMPLANT
BLADE SURG 15 STRL LF DISP TIS (BLADE) ×1 IMPLANT
BLADE SURG 15 STRL SS (BLADE) ×2
CANISTER SUCTION 2500CC (MISCELLANEOUS) ×2 IMPLANT
CHLORAPREP W/TINT 10.5 ML (MISCELLANEOUS) ×2 IMPLANT
CLIP TI MEDIUM 6 (CLIP) ×4 IMPLANT
CLIP TI WIDE RED SMALL 6 (CLIP) ×4 IMPLANT
CLOTH BEACON ORANGE TIMEOUT ST (SAFETY) ×2 IMPLANT
CLSR STERI-STRIP ANTIMIC 1/2X4 (GAUZE/BANDAGES/DRESSINGS) ×1 IMPLANT
DISSECTOR ROUND CHERRY 3/8 STR (MISCELLANEOUS) IMPLANT
DRAPE PED LAPAROTOMY (DRAPES) ×2 IMPLANT
DRESSING SURGICEL FIBRLLR 1X2 (HEMOSTASIS) ×1 IMPLANT
DRSG SURGICEL FIBRILLAR 1X2 (HEMOSTASIS) ×2
ELECT REM PT RETURN 9FT ADLT (ELECTROSURGICAL) ×2
ELECTRODE REM PT RTRN 9FT ADLT (ELECTROSURGICAL) ×1 IMPLANT
GAUZE SPONGE 4X4 16PLY XRAY LF (GAUZE/BANDAGES/DRESSINGS) ×2 IMPLANT
GLOVE SURG ORTHO 8.0 STRL STRW (GLOVE) ×2 IMPLANT
GOWN STRL NON-REIN LRG LVL3 (GOWN DISPOSABLE) ×2 IMPLANT
GOWN STRL REIN XL XLG (GOWN DISPOSABLE) ×4 IMPLANT
KIT BASIN OR (CUSTOM PROCEDURE TRAY) ×2 IMPLANT
NS IRRIG 1000ML POUR BTL (IV SOLUTION) ×2 IMPLANT
PACK BASIC VI WITH GOWN DISP (CUSTOM PROCEDURE TRAY) ×2 IMPLANT
PENCIL BUTTON HOLSTER BLD 10FT (ELECTRODE) ×2 IMPLANT
SHEARS HARMONIC 9CM CVD (BLADE) ×2 IMPLANT
SPONGE GAUZE 4X4 12PLY (GAUZE/BANDAGES/DRESSINGS) ×1 IMPLANT
STAPLER VISISTAT 35W (STAPLE) ×1 IMPLANT
STRIP CLOSURE SKIN 1/2X4 (GAUZE/BANDAGES/DRESSINGS) ×2 IMPLANT
SUT MNCRL AB 4-0 PS2 18 (SUTURE) ×2 IMPLANT
SUT SILK 2 0 (SUTURE) ×2
SUT SILK 2-0 18XBRD TIE 12 (SUTURE) ×1 IMPLANT
SUT SILK 3 0 (SUTURE)
SUT SILK 3-0 18XBRD TIE 12 (SUTURE) IMPLANT
SUT VIC AB 3-0 SH 18 (SUTURE) ×2 IMPLANT
SYR BULB IRRIGATION 50ML (SYRINGE) ×2 IMPLANT
TAPE CLOTH SURG 4X10 WHT LF (GAUZE/BANDAGES/DRESSINGS) ×1 IMPLANT
TOWEL OR 17X26 10 PK STRL BLUE (TOWEL DISPOSABLE) ×2 IMPLANT
YANKAUER SUCT BULB TIP 10FT TU (MISCELLANEOUS) ×2 IMPLANT

## 2012-05-12 NOTE — Transfer of Care (Signed)
Immediate Anesthesia Transfer of Care Note  Patient: Randall Lewis  Procedure(s) Performed: Procedure(s) (LRB) with comments: THYROID LOBECTOMY (Left) - left thyroid lobectomy  Patient Location: PACU  Anesthesia Type:General  Level of Consciousness: awake, alert  and oriented  Airway & Oxygen Therapy: Patient Spontanous Breathing and Patient connected to face mask oxygen  Post-op Assessment: Report given to PACU RN and Post -op Vital signs reviewed and stable  Post vital signs: Reviewed and stable  Complications: No apparent anesthesia complications

## 2012-05-12 NOTE — Anesthesia Preprocedure Evaluation (Addendum)
Anesthesia Evaluation  Patient identified by MRN, date of birth, ID band Patient awake    Reviewed: Allergy & Precautions, H&P , NPO status , Patient's Chart, lab work & pertinent test results  Airway Mallampati: II TM Distance: >3 FB Neck ROM: Full    Dental No notable dental hx.    Pulmonary neg pulmonary ROS, sleep apnea , former smoker,  breath sounds clear to auscultation  Pulmonary exam normal       Cardiovascular hypertension, Pt. on medications negative cardio ROS  Rhythm:Regular Rate:Normal     Neuro/Psych H/o brain tumor. In remission now negative neurological ROS  negative psych ROS   GI/Hepatic negative GI ROS, Neg liver ROS,   Endo/Other  negative endocrine ROS  Renal/GU negative Renal ROS  negative genitourinary   Musculoskeletal negative musculoskeletal ROS (+)   Abdominal   Peds negative pediatric ROS (+)  Hematology negative hematology ROS (+)   Anesthesia Other Findings   Reproductive/Obstetrics negative OB ROS                          Anesthesia Physical Anesthesia Plan  ASA: II  Anesthesia Plan: General   Post-op Pain Management:    Induction: Intravenous  Airway Management Planned: Oral ETT  Additional Equipment:   Intra-op Plan:   Post-operative Plan: Extubation in OR  Informed Consent: I have reviewed the patients History and Physical, chart, labs and discussed the procedure including the risks, benefits and alternatives for the proposed anesthesia with the patient or authorized representative who has indicated his/her understanding and acceptance.   Dental advisory given  Plan Discussed with: CRNA  Anesthesia Plan Comments:         Anesthesia Quick Evaluation

## 2012-05-12 NOTE — Progress Notes (Signed)
Placed patient on one of our Cpap machines.  Mode is auto 20-4 with a 2lpm bleed in.  Patient says that it feels comfortable.   Patient has own mask and tubing.

## 2012-05-12 NOTE — Anesthesia Postprocedure Evaluation (Signed)
  Anesthesia Post-op Note  Patient: Randall Lewis  Procedure(s) Performed: Procedure(s) (LRB): THYROID LOBECTOMY (Left)  Patient Location: PACU  Anesthesia Type: General  Level of Consciousness: awake and alert   Airway and Oxygen Therapy: Patient Spontanous Breathing  Post-op Pain: mild  Post-op Assessment: Post-op Vital signs reviewed, Patient's Cardiovascular Status Stable, Respiratory Function Stable, Patent Airway and No signs of Nausea or vomiting  Last Vitals:  Filed Vitals:   05/12/12 1315  BP: 135/79  Pulse: 51  Temp: 36.4 C  Resp: 16    Post-op Vital Signs: stable   Complications: No apparent anesthesia complications

## 2012-05-12 NOTE — Op Note (Signed)
NAME:  Randall Lewis, FACE NO.:  [id number]  MEDICAL RECORD NO.:  [id number]  LOCATION:  1534                         FACILITY:  [facility]  PHYSICIAN:  Velora Heckler, MD      DATE OF BIRTH:  [birth year]  DATE OF PROCEDURE:  05/12/2012                               OPERATIVE REPORT   PREOPERATIVE DIAGNOSIS:  Left thyroid nodule with atypia.  POSTOPERATIVE DIAGNOSIS:  Left thyroid nodule with atypia.  PROCEDURE:  Left thyroid lobectomy.  SURGEON:  Velora Heckler, MD, FACS  ANESTHESIA:  General.  ESTIMATED BLOOD LOSS:  Minimal.  PREPARATION:  ChloraPrep.  COMPLICATIONS:  None.  INDICATIONS:  The patient is a 56 year old male referred by Dr. Jarome Matin with a newly diagnosed thyroid neoplasm.  The patient had undergone a screening carotid duplex study.  This indicated evidence of a thyroid mass.  In October 2013, he underwent thyroid ultrasound showing a complex lesion in the inferior pole of the left thyroid lobe measuring 3.6 cm in maximum dimension.  Right thyroid lobe was normal. Ultrasound-guided fine-needle aspiration biopsy was performed, which showed nuclear overlap with cytologic atypia.  The patient now comes to Surgery for resection for definitive diagnosis.  BODY OF REPORT:  Procedures done in OR #1 at the [facility].  The patient was brought to the operating room, placed in a supine position on the operating room table.  Following administration of general anesthesia, the patient was positioned and then prepped and draped in the usual strict aseptic fashion.  After ascertaining that an adequate level of anesthesia had been achieved, a Kocher incision was made with a #15 blade.  Dissection was carried through subcutaneous tissues and platysma.  Hemostasis was obtained with the electrocautery. Skin flaps were elevated cephalad and caudad from the thyroid notch to the sternal notch.  A Mahorner self-retaining retractor was placed  for exposure.  Strap muscles were incised in the midline and reflected to the left.  Left thyroid lobe was exposed.  It was gently mobilized. Overlying strap muscles were somewhat adherent to the capsule probably related to the previous biopsy.  Strap muscles were reflected laterally and venous tributaries were divided between Ligaclips with the Harmonic scalpel.  Superior pole was dissected out and superior pole vessels were divided individually between small and medium Ligaclips with the Harmonic scalpel.  Inferior venous tributaries were divided between Ligaclips with the Harmonic scalpel.  Gland was rolled anteriorly. Branches of the inferior thyroid artery were divided between small Ligaclips.  Recurrent laryngeal nerve was identified and preserved. Ligament of Allyson Sabal was released with the electrocautery and the gland was mobilized anteriorly onto the trachea.  Isthmus was mobilized across the midline.  There was no significant pyramidal lobe present.  The thyroid tissue was divided with the Harmonic scalpel at the junction of the isthmus and right thyroid lobe.  The specimen was submitted in its entirety to Pathology for review.  Palpation of the right lobe shows no evidence of nodule or other abnormality.  Left neck was irrigated with warm saline.  Good hemostasis was noted. Fibrillar was placed throughout the operative field.  Strap muscles were reapproximated in the midline with  interrupted 3-0 Vicryl sutures. Platysma was closed with interrupted 3-0 Vicryl sutures.  Skin was closed with a running 4-0 Monocryl subcuticular suture.  Wound was washed and dried and benzoin and Steri-Strips were applied.  Sterile dressings were applied.  The patient was awakened from anesthesia and brought to the recovery room.  The patient tolerated the procedure well.  Velora Heckler, MD, [facility] Office: [phone]    TMG/MEDQ  D:  05/12/2012  T:  05/12/2012   Job:  [id number]  cc:   Barry Dienes. Eloise Harman, M.D. Fax: [phone]

## 2012-05-12 NOTE — Brief Op Note (Signed)
05/12/2012  10:49 AM  PATIENT:  Randall Lewis  56 y.o. male  PRE-OPERATIVE DIAGNOSIS:  thyroid neoplasm of uncertain behavior, left thyroid nodule  POST-OPERATIVE DIAGNOSIS:  same  PROCEDURE:  Procedure(s) (LRB) with comments: THYROID LOBECTOMY (Left) - left thyroid lobectomy  SURGEON:  Surgeon(s) and Role:    * Velora Heckler, MD - Primary  ANESTHESIA:   general  EBL:     BLOOD ADMINISTERED:none  DRAINS: none   LOCAL MEDICATIONS USED:  NONE  SPECIMEN:  Excision  DISPOSITION OF SPECIMEN:  PATHOLOGY  COUNTS:  YES  TOURNIQUET:  * No tourniquets in log *  DICTATION: .Other Dictation: Dictation Number [id number]  PLAN OF CARE: Admit for overnight observation  PATIENT DISPOSITION:  PACU - hemodynamically stable.   Delay start of Pharmacological VTE agent (>24hrs) due to surgical blood loss or risk of bleeding: yes  Velora Heckler, MD, FACS General & Endocrine Surgery [facility] Office: [phone]

## 2012-05-12 NOTE — H&P (View-Only) (Signed)
General Surgery [facility]  Chief Complaint  Patient presents with  . New Evaluation    Evaluate left thyroid nodule with atypia - referral from Dr. Jarome Matin    HISTORY: Patient is a 56 year old male referred by his primary care physician for newly diagnosed thyroid neoplasm. Patient had undergone a carotid duplex study with an incidental finding of a thyroid nodule. In October 2013 he underwent a thyroid ultrasound. This showed a complex lesion in the left thyroid lobe measuring 3.6 cm in maximum dimension. Right thyroid lobe was normal. Patient underwent ultrasound-guided fine needle aspiration biopsy. This showed nuclear overlap with cytologic atypia. Patient is now referred for consideration of excision of left thyroid lobe for definitive diagnosis.  Patient has no prior history of thyroid disease. He is never been on thyroid medication. There is no family history of thyroid disease and specifically no history of thyroid cancer. There is no history of other endocrinopathy.  Patient has had no prior neck surgery. He has undergone a craniotomy in the past but at [facility] in 2006. He continues to do well from that procedure.  Past Medical History  Diagnosis Date  . Hypertension   . Cancer 2006    Brain tumor  . Heart murmur     History of heart murmur  . Hyperlipidemia      Current Outpatient Prescriptions  Medication Sig Dispense Refill  . aspirin 81 MG tablet Take 81 mg by mouth daily.      . fenofibrate 54 MG tablet Take 54 mg by mouth daily.      . fish oil-omega-3 fatty acids 1000 MG capsule Take 2 g by mouth daily.      . simvastatin (ZOCOR) 10 MG tablet Take 10 mg by mouth at bedtime.      . valsartan (DIOVAN) 160 MG tablet Take 160 mg by mouth daily.         No Known Allergies   Family History  Problem Relation Age of Onset  . Cancer Mother     Breast Cancer  . Cancer Brother 30     History   Social History   . Marital Status: Married    Spouse Name: N/A    Number of Children: N/A  . Years of Education: N/A   Social History Main Topics  . Smoking status: Former Smoker    Quit date: 04/26/1996  . Smokeless tobacco: None  . Alcohol Use: No  . Drug Use: None  . Sexually Active: None   Other Topics Concern  . None   Social History Narrative  . None     REVIEW OF SYSTEMS - PERTINENT POSITIVES ONLY: Denies tremors. Denies palpitations. Denies masses. Denies compressive symptoms.  EXAM: Filed Vitals:   04/26/12 0937  BP: 128/88  Pulse: 66  Temp: 97.1 F (36.2 C)  Resp: 16    HEENT: Well-healed craniotomy incision left parietal scalp; normocephalic; pupils equal and reactive; sclerae clear; dentition good; mucous membranes moist NECK:  Palpable soft mass mid to inferior left thyroid lobe, approximately 2-3 cm in size, mobile with swallowing; symmetric on extension; no palpable anterior or posterior cervical lymphadenopathy; no supraclavicular masses; no tenderness CHEST: clear to auscultation bilaterally without rales, rhonchi, or wheezes CARDIAC: regular rate and rhythm without significant murmur; peripheral pulses are full EXT:  non-tender without edema; no deformity NEURO: no gross focal deficits; no sign of tremor   LABORATORY RESULTS: See [facility] (CHL-Epic) for most recent results  RADIOLOGY RESULTS: See [facility] (CHL-Epic) for most recent results   IMPRESSION: Left thyroid nodule with cytologic atypia, 3.6 cm  PLAN: The patient and I discussed all of the above findings and reviewed the above studies together. I have recommended left thyroid lobectomy for definitive diagnosis. I believe there is approximately a 25% chance of malignancy. We discussed the procedure, the hospital stay, in the postoperative recovery. We discussed the potential for completion thyroidectomy in the event of malignancy. We discussed potential complications including injury to  recurrent laryngeal nerve and injury to parathyroid glands. Patient understands and wishes to proceed with surgery in the near future.  The risks and benefits of the procedure have been discussed at length with the patient.  The patient understands the proposed procedure, potential alternative treatments, and the course of recovery to be expected.  All of the patient's questions have been answered at this time.  The patient wishes to proceed with surgery.  Velora Heckler, MD, FACS General & Endocrine Surgery [facility]   Visit Diagnoses: 1. Neoplasm of uncertain behavior of thyroid gland, left lobe     Primary Care Physician: Garlan Fillers, MD

## 2012-05-12 NOTE — Interval H&P Note (Signed)
History and Physical Interval Note:  05/12/2012 9:17 AM  Randall Lewis  has presented today for surgery, with the diagnosis of thyroid neoplasm of uncertain behavior.  The various methods of treatment have been discussed with the patient and family. After consideration of risks, benefits and other options for treatment, the patient has consented to    Procedure(s) (LRB) with comments: THYROID LOBECTOMY (Left) as a surgical intervention .    The patient's history has been reviewed, patient examined, no change in status, stable for surgery.  I have reviewed the patient's chart and labs.  Questions were answered to the patient's satisfaction.    Velora Heckler, MD, FACS General & Endocrine Surgery [facility] Office: [phone]   Louann Hopson Judie Petit

## 2012-05-13 NOTE — Progress Notes (Signed)
1 Day Post-Op  Subjective: Throat is a little sore.  Swallowing okay.  Objective: Vital signs in last 24 hours: Temp:  [97.3 F (36.3 C)-98.6 F (37 C)] 98 F (36.7 C) (12/14 0630) Pulse Rate:  [48-66] 66  (12/14 0630) Resp:  [6-20] 18  (12/14 0630) BP: (130-156)/(73-90) 156/90 mmHg (12/14 0630) SpO2:  [92 %-100 %] 97 % (12/14 0630) Weight:  [240 lb (108.863 kg)] 240 lb (108.863 kg) (12/13 1345) Last BM Date: 05/11/12  Intake/Output from previous day: 12/13 0701 - 12/14 0700 In: 2235 [P.O.:120; I.V.:2115] Out: 2500 [Urine:2500] Intake/Output this shift:    PE: General- In NAD Neck-wound is clean, minimal swelling  Lab Results:   Basename 05/10/12 1455  WBC 5.7  HGB 15.6  HCT 45.6  PLT 234   BMET  Basename 05/10/12 1455  NA 139  K 3.3*  CL 102  CO2 26  GLUCOSE 156*  BUN 14  CREATININE 1.12  CALCIUM 9.7   PT/INR No results found for this basename: LABPROT:2,INR:2 in the last 72 hours Comprehensive Metabolic Panel:    Component Value Date/Time   NA 139 05/10/2012 1455   K 3.3* 05/10/2012 1455   CL 102 05/10/2012 1455   CO2 26 05/10/2012 1455   BUN 14 05/10/2012 1455   CREATININE 1.12 05/10/2012 1455   GLUCOSE 156* 05/10/2012 1455   CALCIUM 9.7 05/10/2012 1455   AST 21 12/21/2005 1607   ALT 30 12/21/2005 1607   ALKPHOS 42 12/21/2005 1607   BILITOT 0.4 12/21/2005 1607   PROT 6.9 12/21/2005 1607   ALBUMIN 4.7 12/21/2005 1607     Studies/Results: No results found.  Anti-infectives: Anti-infectives     Start     Dose/Rate Route Frequency Ordered Stop   05/12/12 0704   ceFAZolin (ANCEF) IVPB 2 g/50 mL premix        2 g 100 mL/hr over 30 Minutes Intravenous On call to O.R. 05/12/12 0704 05/12/12 0936          Assessment Principal Problem:  *Neoplasm of uncertain behavior of thyroid gland, left lobe s/p left thyroid lobectomy 05/12/12-doing well    LOS: 1 day   Plan: Discharge.  Instructions given.   Randall Lewis 05/13/2012

## 2012-05-13 NOTE — Discharge Summary (Signed)
Physician Discharge Summary  Patient ID: Randall Lewis MRN: [id number] DOB/AGE: 07/08/1955 56 y.o.  Admit date: 05/12/2012 Discharge date: 05/13/2012  Admission Diagnoses:Neoplasm of uncertain behavior of thyroid gland, left lobe  Discharge Diagnoses:  Principal Problem:  *Neoplasm of uncertain behavior of thyroid gland, left lobe   Discharged Condition: good  Hospital Course: He underwent left thyroid lobectomy and tolerated it well.  He was able to be discharged on POD#1.  Discharge instructions were given to him.  Consults: None  Significant Diagnostic Studies: none   Treatments: surgery: Left thyroid lobectomy 05/12/12.  Discharge Exam: Blood pressure 156/90, pulse 66, temperature 98 F (36.7 C), temperature source Oral, resp. rate 18, height 5\' 11"  (1.803 m), weight 240 lb (108.863 kg), SpO2 97.00%.   Disposition: 01-Home or Self Care  Discharge Orders    Future Appointments: Provider: Department: Dept Phone: Center:   06/07/2012 11:15 AM Velora Heckler, MD [facility] [phone] None       Medication List     As of 05/13/2012 10:09 AM    TAKE these medications         aspirin 81 MG tablet   Take 81 mg by mouth every morning.      fenofibrate 54 MG tablet   Take 54 mg by mouth every morning.      fish oil-omega-3 fatty acids 1000 MG capsule   Take 3 g by mouth daily.      HYDROcodone-acetaminophen 5-325 MG per tablet   Commonly known as: NORCO/VICODIN   Take 1-2 tablets by mouth every 4 (four) hours as needed for pain.      simvastatin 10 MG tablet   Commonly known as: ZOCOR   Take 10 mg by mouth every morning.      valsartan 160 MG tablet   Commonly known as: DIOVAN   Take 160 mg by mouth every morning.         Signed: Adolph Pollack 05/13/2012, 10:09 AM

## 2012-05-13 NOTE — Progress Notes (Signed)
Patient is alert and oriented, vital signs are stable, incision to neck within normal limits, daughter at bedside and supportive, patient's pain in under control, discharge instructions reviewed with patient and questions and concerns answered, patient is to follow up [employer], Myrtie Hawk RN 05-13-2012 11:35am

## 2012-05-15 ENCOUNTER — Encounter (HOSPITAL_COMMUNITY): Payer: Self-pay | Admitting: Surgery

## 2012-05-16 NOTE — Progress Notes (Signed)
Quick Note:  Please contact patient and notify of benign pathology results.  Arleene Settle M. Becky Berberian, MD, FACS [facility] Office: [phone]   ______ 

## 2012-05-17 ENCOUNTER — Telehealth (INDEPENDENT_AMBULATORY_CARE_PROVIDER_SITE_OTHER): Payer: Self-pay

## 2012-05-17 NOTE — Telephone Encounter (Signed)
Pt notified of path result per Dr Gerkin's request. 

## 2012-06-07 ENCOUNTER — Ambulatory Visit (INDEPENDENT_AMBULATORY_CARE_PROVIDER_SITE_OTHER): Admitting: Surgery

## 2012-06-07 ENCOUNTER — Encounter (INDEPENDENT_AMBULATORY_CARE_PROVIDER_SITE_OTHER): Payer: Self-pay | Admitting: Surgery

## 2012-06-07 VITALS — BP 140/88 | HR 68 | Temp 97.6°F | Resp 16 | Ht 71.0 in | Wt 245.4 lb

## 2012-06-07 DIAGNOSIS — D440 Neoplasm of uncertain behavior of thyroid gland: Secondary | ICD-10-CM

## 2012-06-07 DIAGNOSIS — D449 Neoplasm of uncertain behavior of unspecified endocrine gland: Secondary | ICD-10-CM

## 2012-06-07 NOTE — Progress Notes (Signed)
General Surgery [facility]  Visit Diagnoses: 1. Neoplasm of uncertain behavior of thyroid gland, left lobe     HISTORY: Patient returns for her first postoperative visit having undergone thyroid lobectomy on 05/12/2012. Final pathology shows a follicular adenoma. There was no evidence of malignancy.  EXAM: Surgical incision is healing nicely. Minimal soft tissue swelling. No sign of seroma. No sign of infection. Voice quality is normal.  IMPRESSION: Status post thyroid lobectomy with benign final pathology  PLAN: Patient will have a TSH level checked next week. He is not currently taking thyroid hormone supplementation. He will begin applying topical creams to his incision. He will return in 6 weeks for a final wound check.  Velora Heckler, MD, FACS General & Endocrine Surgery [facility]

## 2012-06-07 NOTE — Patient Instructions (Signed)
  COCOA BUTTER & VITAMIN E CREAM  ([employer] or other brand)  Apply cocoa butter/vitamin E cream to your incision 2 - 3 times daily.  Massage cream into incision for one minute with each application.  Use sunscreen (50 SPF or higher) for first 6 months after surgery if area is exposed to sun.  You may substitute Mederma or other scar reducing creams as desired.   

## 2012-06-16 ENCOUNTER — Telehealth (INDEPENDENT_AMBULATORY_CARE_PROVIDER_SITE_OTHER): Payer: Self-pay

## 2012-06-16 NOTE — Telephone Encounter (Signed)
TSH normal to Dr Gerrit Friends for review.

## 2012-07-07 ENCOUNTER — Encounter (INDEPENDENT_AMBULATORY_CARE_PROVIDER_SITE_OTHER): Payer: Self-pay

## 2012-07-18 ENCOUNTER — Encounter (INDEPENDENT_AMBULATORY_CARE_PROVIDER_SITE_OTHER): Payer: Self-pay | Admitting: Surgery

## 2012-07-18 ENCOUNTER — Ambulatory Visit (INDEPENDENT_AMBULATORY_CARE_PROVIDER_SITE_OTHER): Admitting: Surgery

## 2012-07-18 VITALS — BP 138/76 | HR 72 | Temp 97.1°F | Resp 20 | Ht 71.0 in | Wt 245.8 lb

## 2012-07-18 DIAGNOSIS — D449 Neoplasm of uncertain behavior of unspecified endocrine gland: Secondary | ICD-10-CM

## 2012-07-18 DIAGNOSIS — D440 Neoplasm of uncertain behavior of thyroid gland: Secondary | ICD-10-CM

## 2012-07-18 NOTE — Progress Notes (Signed)
General Surgery [facility]  Visit Diagnoses: 1. Neoplasm of uncertain behavior of thyroid gland, left lobe     HISTORY: Patient is a 57 year old male who underwent thyroid lobectomy. 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 Follow-up TSH level approximately 6 weeks after surgery remains normal at 3.360. At this point he does not require thyroid hormone supplementation.  He returns today for final wound check.  EXAM: Surgical incision is healed nicely with a good cosmetic result. Minimal soft tissue swelling. Voice quality is normal.  IMPRESSION: Status post thyroid lobectomy for benign follicular adenoma  PLAN: Patient will continue to apply topical creams to his incisions. He should have a TSH level checked in 6 months. His primary care physician will then monitor his TSH level a newly.  Patient will return as needed.  Velora Heckler, MD, FACS General & Endocrine Surgery [facility]

## 2012-07-18 NOTE — Patient Instructions (Signed)
  COCOA BUTTER & VITAMIN E CREAM  ([employer] or other brand)  Apply cocoa butter/vitamin E cream to your incision 2 - 3 times daily.  Massage cream into incision for one minute with each application.  Use sunscreen (50 SPF or higher) for first 6 months after surgery if area is exposed to sun.  You may substitute Mederma or other scar reducing creams as desired.   

## 2013-02-19 ENCOUNTER — Ambulatory Visit: Payer: Self-pay | Admitting: Oncology

## 2013-02-20 LAB — CBC CANCER CENTER
HGB: 16.8 g/dL (ref 13.0–18.0)
Lymphocyte #: 2 x10 3/mm (ref 1.0–3.6)
Lymphocyte %: 32 %
MCHC: 32.9 g/dL (ref 32.0–36.0)
MCV: 79 fL — ABNORMAL LOW (ref 80–100)
Neutrophil %: 59.7 %

## 2013-02-28 ENCOUNTER — Ambulatory Visit: Payer: Self-pay | Admitting: Oncology

## 2013-03-31 ENCOUNTER — Ambulatory Visit: Payer: Self-pay | Admitting: Oncology

## 2013-08-16 ENCOUNTER — Other Ambulatory Visit: Payer: Self-pay | Admitting: Internal Medicine

## 2013-08-16 DIAGNOSIS — R3129 Other microscopic hematuria: Secondary | ICD-10-CM

## 2013-08-16 DIAGNOSIS — D4959 Neoplasm of unspecified behavior of other genitourinary organ: Secondary | ICD-10-CM

## 2013-08-20 ENCOUNTER — Inpatient Hospital Stay
Admission: RE | Admit: 2013-08-20 | Discharge: 2013-08-20 | Disposition: A | Discharge status: Home / Self Care | Payer: Self-pay | Source: Ambulatory Visit | Attending: Internal Medicine | Admitting: Internal Medicine

## 2013-08-20 ENCOUNTER — Other Ambulatory Visit: Payer: Self-pay | Admitting: Internal Medicine

## 2013-08-20 ENCOUNTER — Ambulatory Visit
Admission: RE | Admit: 2013-08-20 | Discharge: 2013-08-20 | Disposition: A | Discharge status: Home / Self Care | Source: Ambulatory Visit | Attending: Internal Medicine | Admitting: Internal Medicine

## 2013-08-20 DIAGNOSIS — D4959 Neoplasm of unspecified behavior of other genitourinary organ: Secondary | ICD-10-CM

## 2013-08-20 DIAGNOSIS — R319 Hematuria, unspecified: Secondary | ICD-10-CM

## 2013-08-20 DIAGNOSIS — R3129 Other microscopic hematuria: Secondary | ICD-10-CM

## 2013-08-20 IMAGING — CT CT ABD-PEL WO/W CM
2 of 9 series · 13 of 46 positions shown, 18 images · IV contrast (READICAT/WATER & [ID] OMNI 300)
Comparison: [DATE]

CLINICAL DATA: Renal neoplasm.  Microscopic hematuria.

EXAM:
CT ABDOMEN AND PELVIS WITHOUT AND WITH CONTRAST
TECHNIQUE: Multidetector CT imaging of the abdomen and pelvis was performed
without contrast material in one or both body regions, followed by
contrast material(s) and further sections in one or both body
regions.
CONTRAST:  125mL OMNIPAQUE IOHEXOL 300 MG/ML  SOLN

[Series 4: arterial/nephrographic · axial · arterial · 0.88mm/px · z∈[-412,-4]mm · 11 of 341 slices shown, 15 images]
[im 33/341  soft-tissue]
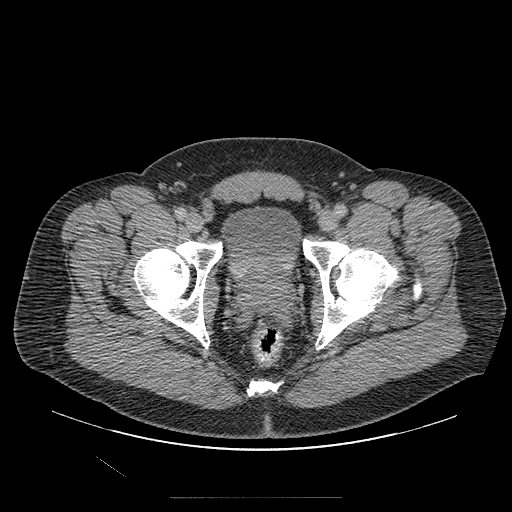
[im 33/341  bone]
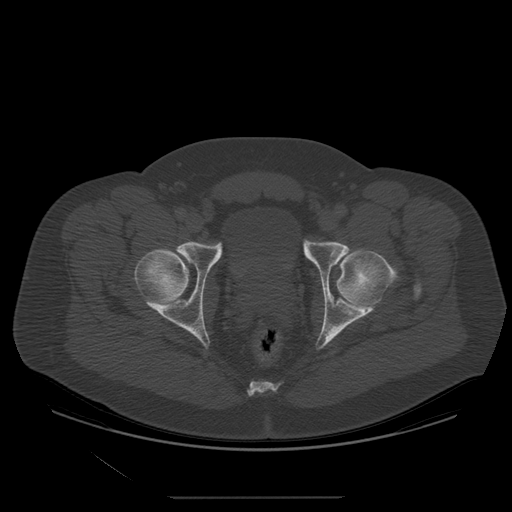
[im 65/341  soft-tissue]
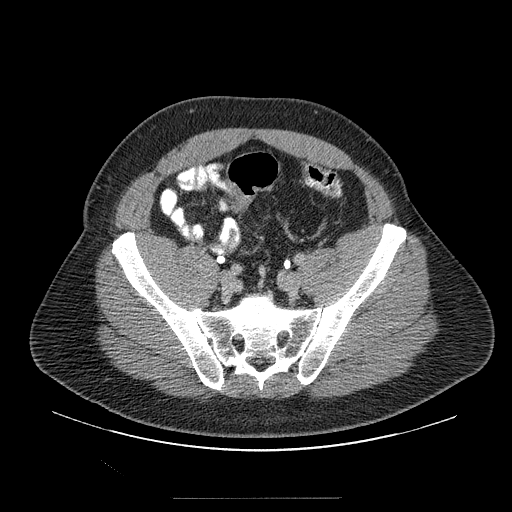
[im 98/341  soft-tissue]
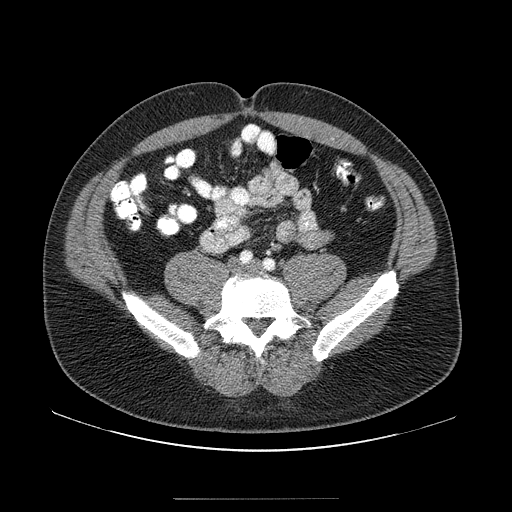
[im 130/341  soft-tissue]
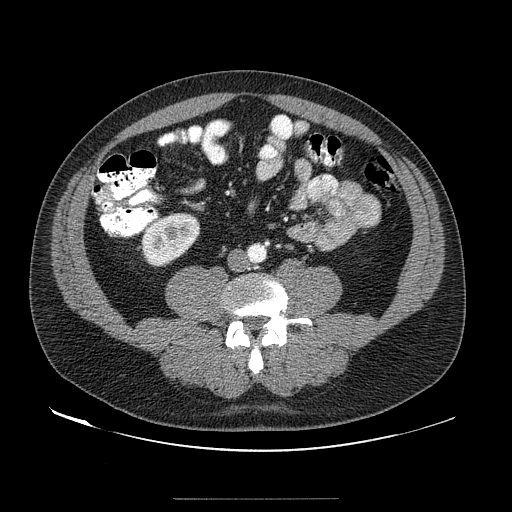
[im 179/341  soft-tissue]
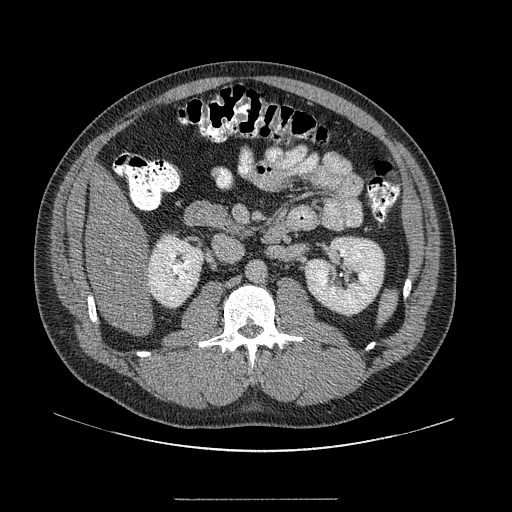
[im 211/341  soft-tissue]
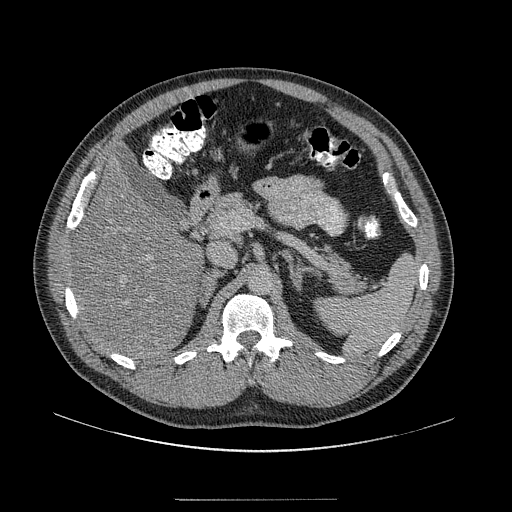
[im 243/341  soft-tissue]
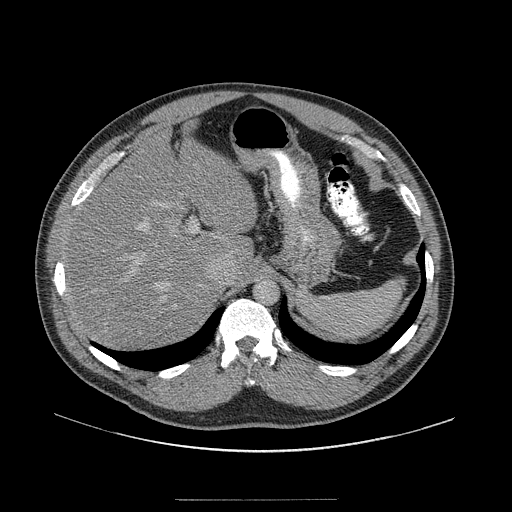
[im 276/341  soft-tissue]
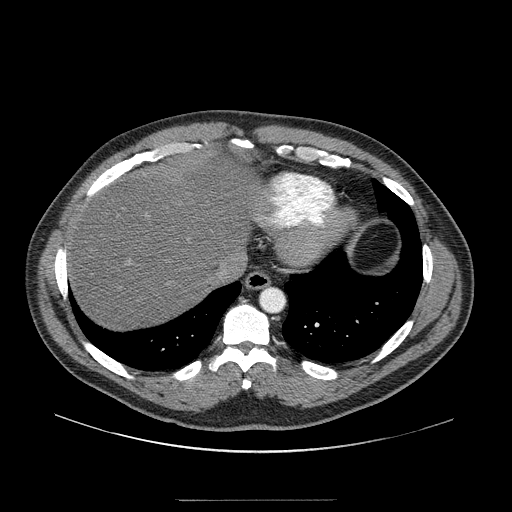
[im 276/341  lung]
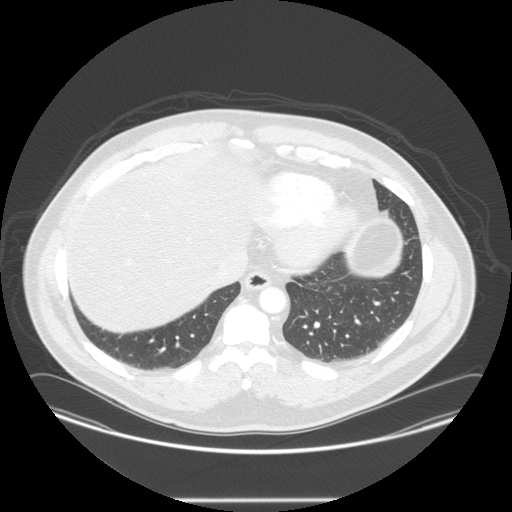
[im 292/341  lung]
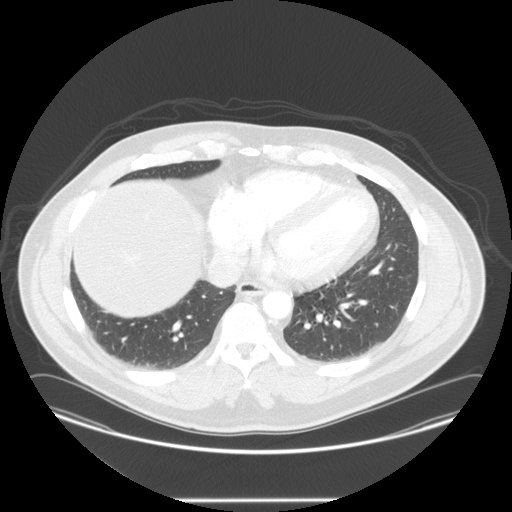
[im 308/341  soft-tissue]
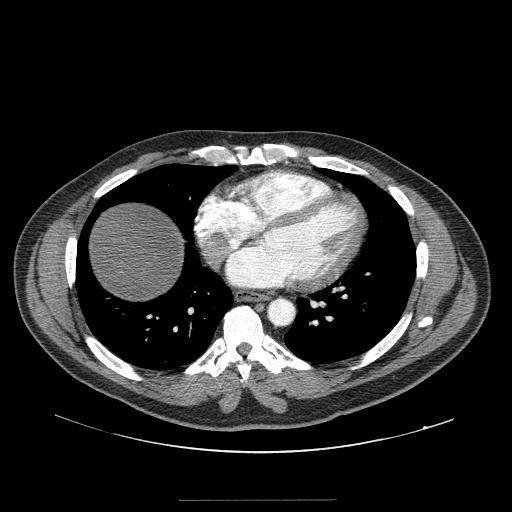
[im 308/341  lung]
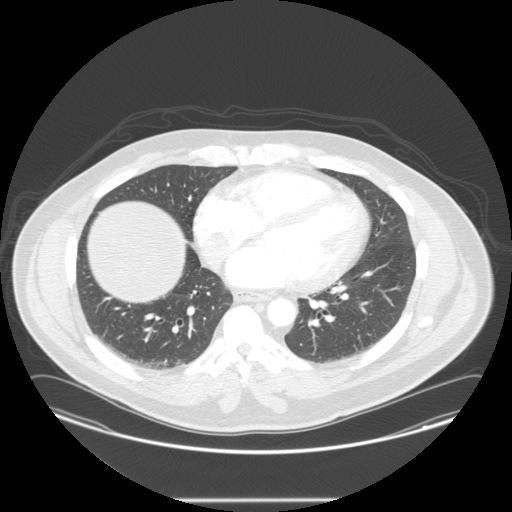
[im 308/341  bone]
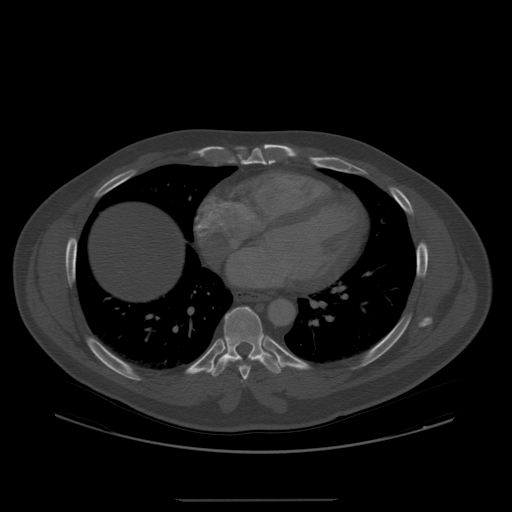
[im 324/341  lung]
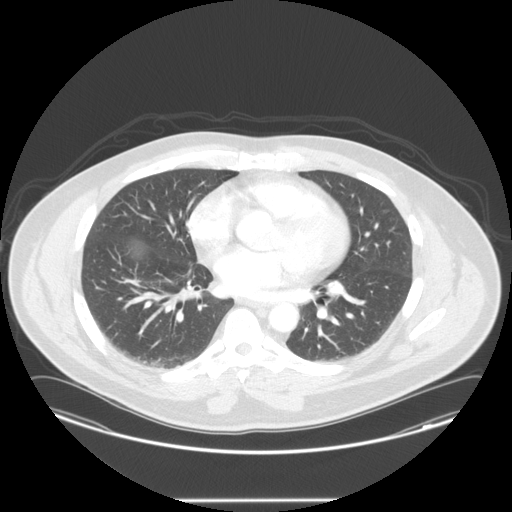

[Series 301: cor wo · coronal · 0.83mm/px · 2 of 130 slices shown, 3 images]
[im 44/130  soft-tissue]
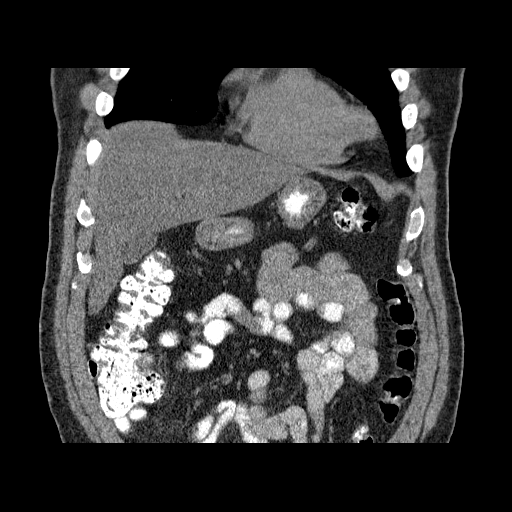
[im 44/130  bone]
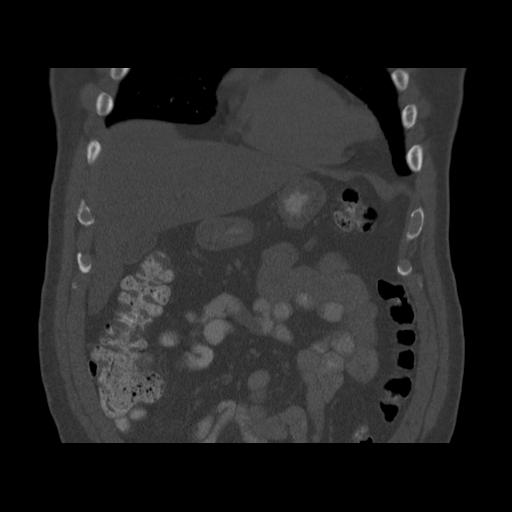
[im 87/130  soft-tissue]
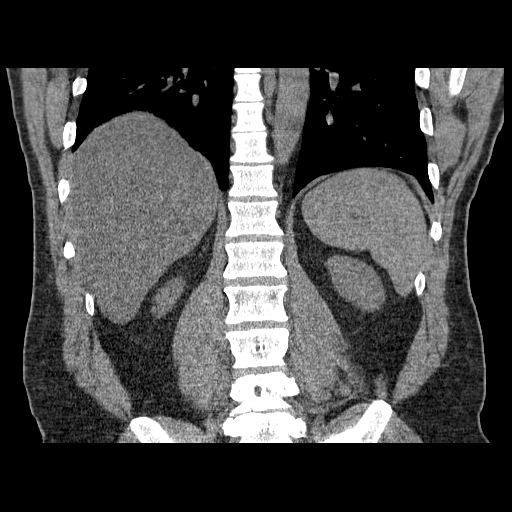

[13 of 46 positions shown; findings below may reference images not displayed]

FINDINGS: Lower Chest: Clear lung bases. Mild cardiomegaly, without
pericardial or pleural effusion. Fluid level in the lower thoracic
esophagus.

Abdomen/Pelvis: No renal calculi or other calcified lesions on
unenhanced imaging of the abdomen.

No hypervascular lesions within the liver, pancreas, or kidneys on
early post-contrast images.

Late portal venous/nephrographic phase images demonstrate moderate
to marked hepatic steatosis and hepatomegaly. 22.2 cm craniocaudal.
No focal liver lesion.

Normal spleen, stomach, pancreas, gallbladder, biliary tract,
adrenal glands. An upper/interpolar right renal lesion measures
cm and slightly greater than fluid density. Twenty-one HU on image
233/series 4. Similar in size to on the prior exam.

Interpolar right-sided renal lesion posteriorly measures 1.5 cm.
Approximately 34 HU prior to contrast on image 93/series 2. Between
62 and 67 HU after contrast on images 96/series 4 and 233/series 4.
This lesion is similar in size on [DATE].

Lower pole right-sided renal lesion measures 63 HU on image
248/series 4. Too small to entirely characterize, at approximately
1.1 cm.

A too small to characterize interpolar left renal lesion.

No retroperitoneal or retrocrural adenopathy. Normal colon,
appendix, and terminal ileum. Normal small bowel without abdominal
ascites. No pelvic adenopathy. Normal urinary bladder. Moderate
prostatomegaly.

Bones/Musculoskeletal: Near complete fusion of the bilateral
sacroiliac joints. Likely degenerative. Congenitally short lumbar
pedicles. Degenerative disc disease at the L4-5 level.
IMPRESSION: 1. Interi and lower polar right renal lesions which are enhancing,
suspicious for solid neoplasms. Given presence on multiple exams,
including back to [DATE], benign or indolent lesions are
favored. Consider dedicated definitive characterization with pre and
post contrast abdominal MRI. Alternatively, given the clinical
history of brain tumor and chemotherapy/radiation therapy, imaging
surveillance with CT may be reasonable.
2. Hepatic steatosis and hepatomegaly.
3. Esophageal air fluid level suggests dysmotility or
gastroesophageal reflux.
4. An upper to interpolar right renal lesion is favored to represent
a minimally complex cyst. This would also be better evaluated by on
MRI.

## 2013-08-20 MED ORDER — IOHEXOL 300 MG/ML  SOLN
125.0000 mL | Freq: Once | INTRAMUSCULAR | Status: AC | PRN
  Administered 2013-08-20: 125 mL via INTRAVENOUS

## 2013-08-23 ENCOUNTER — Other Ambulatory Visit: Payer: Self-pay | Admitting: Internal Medicine

## 2013-08-23 DIAGNOSIS — D49519 Neoplasm of unspecified behavior of unspecified kidney: Secondary | ICD-10-CM

## 2013-08-27 ENCOUNTER — Ambulatory Visit
Admission: RE | Admit: 2013-08-27 | Discharge: 2013-08-27 | Disposition: A | Discharge status: Home / Self Care | Source: Ambulatory Visit | Attending: Internal Medicine | Admitting: Internal Medicine

## 2013-08-27 DIAGNOSIS — D49519 Neoplasm of unspecified behavior of unspecified kidney: Secondary | ICD-10-CM

## 2013-08-27 MED ORDER — GADOBENATE DIMEGLUMINE 529 MG/ML IV SOLN
20.0000 mL | Freq: Once | INTRAVENOUS | Status: AC | PRN
  Administered 2013-08-27: 20 mL via INTRAVENOUS

## 2014-03-26 ENCOUNTER — Ambulatory Visit: Payer: Self-pay | Admitting: Oncology

## 2014-03-29 ENCOUNTER — Ambulatory Visit: Payer: Self-pay | Admitting: Oncology

## 2014-03-31 ENCOUNTER — Ambulatory Visit: Payer: Self-pay | Admitting: Oncology

## 2014-09-25 DIAGNOSIS — R202 Paresthesia of skin: Secondary | ICD-10-CM | POA: Insufficient documentation

## 2015-03-27 ENCOUNTER — Other Ambulatory Visit: Payer: Self-pay | Admitting: Oncology

## 2015-03-27 ENCOUNTER — Other Ambulatory Visit: Payer: Self-pay | Admitting: *Deleted

## 2015-03-27 DIAGNOSIS — C711 Malignant neoplasm of frontal lobe: Secondary | ICD-10-CM

## 2015-03-27 DIAGNOSIS — C719 Malignant neoplasm of brain, unspecified: Secondary | ICD-10-CM

## 2015-03-31 ENCOUNTER — Inpatient Hospital Stay: Admitting: Oncology

## 2015-03-31 ENCOUNTER — Inpatient Hospital Stay

## 2015-04-04 ENCOUNTER — Ambulatory Visit
Admission: RE | Admit: 2015-04-04 | Discharge: 2015-04-04 | Disposition: A | Discharge status: Home / Self Care | Source: Ambulatory Visit | Attending: Oncology | Admitting: Oncology

## 2015-04-04 DIAGNOSIS — C711 Malignant neoplasm of frontal lobe: Secondary | ICD-10-CM | POA: Diagnosis not present

## 2015-04-09 ENCOUNTER — Ambulatory Visit: Admitting: Oncology

## 2015-04-09 ENCOUNTER — Other Ambulatory Visit

## 2015-04-16 ENCOUNTER — Other Ambulatory Visit

## 2015-04-16 ENCOUNTER — Ambulatory Visit: Admitting: Oncology

## 2015-05-07 ENCOUNTER — Inpatient Hospital Stay: Admitting: Oncology

## 2015-05-07 ENCOUNTER — Inpatient Hospital Stay

## 2015-05-14 ENCOUNTER — Inpatient Hospital Stay: Attending: Oncology

## 2015-05-14 ENCOUNTER — Inpatient Hospital Stay (HOSPITAL_BASED_OUTPATIENT_CLINIC_OR_DEPARTMENT_OTHER): Admitting: Oncology

## 2015-05-14 VITALS — BP 149/81 | HR 60 | Temp 96.5°F | Resp 16 | Wt 245.2 lb

## 2015-05-14 DIAGNOSIS — Z803 Family history of malignant neoplasm of breast: Secondary | ICD-10-CM | POA: Insufficient documentation

## 2015-05-14 DIAGNOSIS — E785 Hyperlipidemia, unspecified: Secondary | ICD-10-CM | POA: Insufficient documentation

## 2015-05-14 DIAGNOSIS — Z808 Family history of malignant neoplasm of other organs or systems: Secondary | ICD-10-CM | POA: Diagnosis not present

## 2015-05-14 DIAGNOSIS — Z7984 Long term (current) use of oral hypoglycemic drugs: Secondary | ICD-10-CM | POA: Insufficient documentation

## 2015-05-14 DIAGNOSIS — Z79899 Other long term (current) drug therapy: Secondary | ICD-10-CM | POA: Insufficient documentation

## 2015-05-14 DIAGNOSIS — Z85841 Personal history of malignant neoplasm of brain: Secondary | ICD-10-CM | POA: Insufficient documentation

## 2015-05-14 DIAGNOSIS — Z7982 Long term (current) use of aspirin: Secondary | ICD-10-CM | POA: Diagnosis not present

## 2015-05-14 DIAGNOSIS — Z9221 Personal history of antineoplastic chemotherapy: Secondary | ICD-10-CM | POA: Diagnosis not present

## 2015-05-14 DIAGNOSIS — G4730 Sleep apnea, unspecified: Secondary | ICD-10-CM | POA: Diagnosis not present

## 2015-05-14 DIAGNOSIS — D751 Secondary polycythemia: Secondary | ICD-10-CM | POA: Insufficient documentation

## 2015-05-14 DIAGNOSIS — Z87891 Personal history of nicotine dependence: Secondary | ICD-10-CM

## 2015-05-14 DIAGNOSIS — R011 Cardiac murmur, unspecified: Secondary | ICD-10-CM

## 2015-05-14 DIAGNOSIS — C711 Malignant neoplasm of frontal lobe: Secondary | ICD-10-CM

## 2015-05-14 DIAGNOSIS — I10 Essential (primary) hypertension: Secondary | ICD-10-CM | POA: Insufficient documentation

## 2015-05-14 DIAGNOSIS — Z923 Personal history of irradiation: Secondary | ICD-10-CM | POA: Insufficient documentation

## 2015-05-14 LAB — CBC WITH DIFFERENTIAL/PLATELET
BASOS ABS: 0 10*3/uL (ref 0–0.1)
Basophils Relative: 1 %
Eosinophils Absolute: 0 10*3/uL (ref 0–0.7)
Eosinophils Relative: 1 %
HEMATOCRIT: 53.1 % — AB (ref 40.0–52.0)
Hemoglobin: 17.2 g/dL (ref 13.0–18.0)
LYMPHS PCT: 32 %
Lymphs Abs: 1.9 10*3/uL (ref 1.0–3.6)
MCH: 25.3 pg — ABNORMAL LOW (ref 26.0–34.0)
MCHC: 32.3 g/dL (ref 32.0–36.0)
MCV: 78.4 fL — AB (ref 80.0–100.0)
MONO ABS: 0.3 10*3/uL (ref 0.2–1.0)
Monocytes Relative: 6 %
NEUTROS ABS: 3.5 10*3/uL (ref 1.4–6.5)
Neutrophils Relative %: 60 %
Platelets: 200 10*3/uL (ref 150–440)
RBC: 6.78 MIL/uL — AB (ref 4.40–5.90)
RDW: 15.8 % — ABNORMAL HIGH (ref 11.5–14.5)
WBC: 5.8 10*3/uL (ref 3.8–10.6)

## 2015-05-31 NOTE — Progress Notes (Signed)
[place]  [phone] [phone]  ID: Randall Lewis OB: 01/15/1956  MR#: [id number]  [id number]  Patient Care Team: Leanna Battles, MD as PCP - General (Internal Medicine)  CHIEF COMPLAINT:  Chief Complaint  Patient presents with  . Anaplastic oligodendroglioma    INTERVAL HISTORY:  Patient returns to clinic today for routine yearly evaluation and discussion of his MRI results.  He continues to feel well and remains asymptomatic.  He denies any recent fevers or illnesses.  He has a good appetite and denies weight loss.  He has no neurologic complaints.  He denies any chest pain or shortness of breath.  He denies any nausea, vomiting, constipation, or diarrhea.  He has no urinary complaints.  Patient offers no specific complaints today.   REVIEW OF SYSTEMS:   Review of Systems  Constitutional: Negative for fever and malaise/fatigue.  Respiratory: Negative.   Cardiovascular: Negative.   Gastrointestinal: Negative.   Musculoskeletal: Negative.   Neurological: Negative.  Negative for weakness.    As per HPI. Otherwise, a complete review of systems is negatve.  PAST MEDICAL HISTORY: Past Medical History  Diagnosis Date  . Hypertension   . Heart murmur     History of heart murmur  . Hyperlipidemia   . Sleep apnea   . 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 Brain surgery    . Foot bone excision  1989  . Right knee meniscus repair  2010  . Thyroid lobectomy  05/12/2012    Procedure: THYROID LOBECTOMY;  Surgeon: Earnstine Regal, MD;  Location: [facility];  Service: General;  Laterality: Left;  left thyroid lobectomy    FAMILY HISTORY Family History  Problem Relation Age of Onset  . Cancer Mother     Breast Cancer  . Cancer Brother 58  . Birth defects Son        ADVANCED DIRECTIVES:    HEALTH  MAINTENANCE: Social History  Substance Use Topics  . Smoking status: Former Smoker -- 1.00 packs/day for 23 years    Types: Cigarettes    Quit date: 04/26/1996  . Smokeless tobacco: Never Used  . Alcohol Use: No     Colonoscopy:  PAP:  Bone density:  Lipid panel:  Allergies  Allergen Reactions  . Gadolinium Derivatives Nausea And Vomiting    Per pt , always get sick with gado and never offered anti nausea meds.    Current Outpatient Prescriptions  Medication Sig Dispense Refill  . aspirin 81 MG tablet Take 81 mg by mouth every morning.     . fish oil-omega-3 fatty acids 1000 MG capsule Take 3 g by mouth daily.     . INVOKANA 100 MG TABS tablet     . metFORMIN (GLUCOPHAGE-XR) 500 MG 24 hr tablet     . simvastatin (ZOCOR) 10 MG tablet Take 10 mg by mouth every morning. Reported on 05/14/2015    . valsartan-hydrochlorothiazide (DIOVAN-HCT) 320-12.5 MG tablet     . Vitamin D, Ergocalciferol, (DRISDOL) 50000 UNITS CAPS capsule     . fenofibrate 54 MG tablet Take 54 mg by mouth every morning. Reported on 05/14/2015    . valsartan (DIOVAN) 160 MG tablet Take 160 mg by mouth every morning. Reported on 05/14/2015     No current facility-administered medications for this visit.    OBJECTIVE: Filed Vitals:   05/14/15 1550  BP: 149/81  Pulse: 60  Temp: 96.5 F (35.8 C)  Resp: 16     Body mass index is 34.21 kg/(m^2).    ECOG FS:0 - Asymptomatic  General: Well-developed, well-nourished, no acute distress. Eyes: Pink conjunctiva, anicteric sclera. Lungs: Clear to auscultation bilaterally. Heart: Regular rate and rhythm. No rubs, murmurs, or gallops. Abdomen: Soft, nontender, nondistended. No organomegaly noted, normoactive bowel sounds. Musculoskeletal: No edema, cyanosis, or clubbing. Neuro: Alert, answering all questions appropriately. Cranial nerves grossly intact. Skin: No rashes or petechiae noted. Psych: Normal affect.   LAB RESULTS:  Lab Results  Component Value  Date   NA 139 05/10/2012   K 3.3* 05/10/2012   CL 102 05/10/2012   CO2 26 05/10/2012   GLUCOSE 156* 05/10/2012   BUN 14 05/10/2012   CREATININE 1.12 05/10/2012   CALCIUM 9.7 05/10/2012   PROT 6.9 12/21/2005   ALBUMIN 4.7 12/21/2005   AST 21 12/21/2005   ALT 30 12/21/2005   ALKPHOS 42 12/21/2005   BILITOT 0.4 12/21/2005   GFRNONAA 72* 05/10/2012   GFRAA 84* 05/10/2012    Lab Results  Component Value Date   WBC 5.8 05/14/2015   NEUTROABS 3.5 05/14/2015   HGB 17.2 05/14/2015   HCT 53.1* 05/14/2015   MCV 78.4* 05/14/2015   PLT 200 05/14/2015     STUDIES:  April 04, 2015: Noncontrast MRI the brain: Noncontrast examination demonstrating no features strongly suggestive of recurrent tumor. See discussion above.  No results found.  ASSESSMENT: Anaplastic oligodendroglioma  PLAN:    1.  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  MRI of the brain reviewed independently and reported as above with no evidence of recurrence. Patient is now 10 years removed from completing his treatments, therefore no further imaging or follow-up is necessary.  2. Secondary polycythemia: Patient's hemoglobin is within normal limits today at 17.2.  He does not require phlebotomy. Secondary to testosterone injections which patient is no longer taking.   Patient expressed understanding and was in agreement with this plan. He also understands that He can call clinic at any time with any questions, concerns, or complaints.    [name], MD   05/31/2015 11:31 AM

## 2015-07-10 ENCOUNTER — Other Ambulatory Visit: Payer: Self-pay | Admitting: Urology

## 2015-07-23 NOTE — Patient Instructions (Addendum)
YOUR PROCEDURE IS SCHEDULED ON : 07/29/15  REPORT TO [facility] MAIN ENTRANCE FOLLOW SIGNS TO EAST ELEVATOR - GO TO 3rd FLOOR CHECK IN AT 3 EAST NURSES STATION (SHORT STAY) AT: 5:30 AM  CALL THIS NUMBER IF YOU HAVE PROBLEMS THE MORNING OF SURGERY [phone]  REMEMBER:ONLY 1 PER PERSON MAY GO TO SHORT STAY WITH YOU TO GET READY THE MORNING OF YOUR SURGERY  DO NOT EAT FOOD OR DRINK LIQUIDS AFTER MIDNIGHT  TAKE THESE MEDICINES THE MORNING OF SURGERY: SIMVASTATIN  BRING C-PAP TUBING AND MASK TO HOSPITAL  YOU MAY NOT HAVE ANY METAL ON YOUR BODY INCLUDING HAIR PINS AND PIERCING'S. DO NOT WEAR JEWELRY, MAKEUP, LOTIONS, POWDERS OR PERFUMES. DO NOT WEAR NAIL POLISH. DO NOT SHAVE 48 HRS PRIOR TO SURGERY. MEN MAY SHAVE FACE AND NECK.  DO NOT Gans. [facility] IS NOT RESPONSIBLE FOR VALUABLES.  CONTACTS, DENTURES OR PARTIALS MAY NOT BE WORN TO SURGERY. LEAVE SUITCASE IN CAR. CAN BE BROUGHT TO ROOM AFTER SURGERY.  PATIENTS DISCHARGED THE DAY OF SURGERY WILL NOT BE ALLOWED TO DRIVE HOME.  PLEASE READ OVER THE FOLLOWING INSTRUCTION SHEETS _________________________________________________________________________________                                          [facility] - PREPARING FOR SURGERY  Before surgery, you can play an important role.  Because skin is not sterile, your skin needs to be as free of germs as possible.  You can reduce the number of germs on your skin by washing with CHG (chlorahexidine gluconate) soap before surgery.  CHG is an antiseptic cleaner which kills germs and bonds with the skin to continue killing germs even after washing. Please DO NOT use if you have an allergy to CHG or antibacterial soaps.  If your skin becomes reddened/irritated stop using the CHG and inform your nurse when you arrive at Short Stay. Do not shave (including legs and underarms) for at least 48 hours prior to the first CHG shower.  You may shave your  face. Please follow these instructions carefully:   1.  Shower with CHG Soap the night before surgery and the  morning of Surgery.   2.  If you choose to wash your hair, wash your hair first as usual with your  normal  Shampoo.   3.  After you shampoo, rinse your hair and body thoroughly to remove the  shampoo.                                         4.  Use CHG as you would any other liquid soap.  You can apply chg directly  to the skin and wash . Gently wash with scrungie or clean wascloth    5.  Apply the CHG Soap to your body ONLY FROM THE NECK DOWN.   Do not use on open                           Wound or open sores. Avoid contact with eyes, ears mouth and genitals (private parts).                        Genitals (  private parts) with your normal soap.              6.  Wash thoroughly, paying special attention to the area where your surgery  will be performed.   7.  Thoroughly rinse your body with warm water from the neck down.   8.  DO NOT shower/wash with your normal soap after using and rinsing off  the CHG Soap .                9.  Pat yourself dry with a clean towel.             10.  Wear clean night clothes to bed after shower             11.  Place clean sheets on your bed the night of your first shower and do not  sleep with pets.  Day of Surgery : Do not apply any lotions/deodorants the morning of surgery.  Please wear clean clothes to the hospital/surgery center.  FAILURE TO FOLLOW THESE INSTRUCTIONS MAY RESULT IN THE CANCELLATION OF YOUR SURGERY    PATIENT SIGNATURE_________________________________  ______________________________________________________________________

## 2015-07-24 ENCOUNTER — Encounter (HOSPITAL_COMMUNITY)
Admission: RE | Admit: 2015-07-24 | Discharge: 2015-07-24 | Disposition: A | Discharge status: Home / Self Care | Source: Ambulatory Visit | Attending: Urology | Admitting: Urology

## 2015-07-24 ENCOUNTER — Other Ambulatory Visit: Payer: Self-pay | Admitting: Urology

## 2015-07-24 ENCOUNTER — Encounter (HOSPITAL_COMMUNITY): Payer: Self-pay

## 2015-07-24 DIAGNOSIS — N529 Male erectile dysfunction, unspecified: Secondary | ICD-10-CM | POA: Diagnosis not present

## 2015-07-24 DIAGNOSIS — I10 Essential (primary) hypertension: Secondary | ICD-10-CM | POA: Insufficient documentation

## 2015-07-24 DIAGNOSIS — Z01818 Encounter for other preprocedural examination: Secondary | ICD-10-CM | POA: Insufficient documentation

## 2015-07-24 DIAGNOSIS — Z01812 Encounter for preprocedural laboratory examination: Secondary | ICD-10-CM | POA: Diagnosis not present

## 2015-07-24 DIAGNOSIS — R9431 Abnormal electrocardiogram [ECG] [EKG]: Secondary | ICD-10-CM | POA: Diagnosis not present

## 2015-07-24 HISTORY — DX: Type 2 diabetes mellitus without complications: E11.9

## 2015-07-24 HISTORY — DX: Benign prostatic hyperplasia without lower urinary tract symptoms: N40.0

## 2015-07-24 HISTORY — DX: Male erectile dysfunction, unspecified: N52.9

## 2015-07-24 LAB — BASIC METABOLIC PANEL
ANION GAP: 8 (ref 5–15)
BUN: 15 mg/dL (ref 6–20)
CALCIUM: 9.1 mg/dL (ref 8.9–10.3)
CO2: 27 mmol/L (ref 22–32)
Chloride: 101 mmol/L (ref 101–111)
Creatinine, Ser: 1.07 mg/dL (ref 0.61–1.24)
Glucose, Bld: 87 mg/dL (ref 65–99)
POTASSIUM: 4.2 mmol/L (ref 3.5–5.1)
Sodium: 136 mmol/L (ref 135–145)

## 2015-07-24 LAB — CBC
HEMATOCRIT: 53.7 % — AB (ref 39.0–52.0)
HEMOGLOBIN: 17.7 g/dL — AB (ref 13.0–17.0)
MCH: 26.3 pg (ref 26.0–34.0)
MCHC: 33 g/dL (ref 30.0–36.0)
MCV: 79.9 fL (ref 78.0–100.0)
Platelets: 196 10*3/uL (ref 150–400)
RBC: 6.72 MIL/uL — AB (ref 4.22–5.81)
RDW: 14.8 % (ref 11.5–15.5)
WBC: 5.7 10*3/uL (ref 4.0–10.5)

## 2015-07-25 LAB — HEMOGLOBIN A1C
HEMOGLOBIN A1C: 6.7 % — AB (ref 4.8–5.6)
Mean Plasma Glucose: 146 mg/dL

## 2015-07-28 MED ORDER — GENTAMICIN SULFATE 40 MG/ML IJ SOLN
460.0000 mg | INTRAVENOUS | Status: AC
  Administered 2015-07-29: 460 mg via INTRAVENOUS
  Filled 2015-07-28: qty 11.5

## 2015-07-28 MED ORDER — VANCOMYCIN HCL 10 G IV SOLR
1500.0000 mg | INTRAVENOUS | Status: AC
  Administered 2015-07-29 (×2): 1500 mg via INTRAVENOUS
  Filled 2015-07-28: qty 1500

## 2015-07-28 NOTE — H&P (Signed)
Active Problems Problems  1. Asymptomatic microscopic hematuria (R31.21) 2. Benign prostate hyperplasia (N40.0) 3. Erectile dysfunction due to arterial insufficiency (N52.01) 4. Hypogonadism, testicular (E29.1) 5. Increased urinary frequency (R35.0) 6. 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 He is doing well but has some chronic frequency with occasional hesitancy. He has a new finding of 3-10 RBC's on his UA today. On reviewed of his past imaging, he has a history of enhancing right renal lesions that were stable on scans in 2015.   Past Medical History Problems  1. 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 History of cardiac murmur (Z86.79) 3. History of depression (Z86.59) 4. History of diabetes mellitus (Z86.39) 5. History of hypercholesterolemia (Z86.39) 6. History of hypertension (Z86.79) 7. History of sleep apnea (Z86.69) 8. 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 History of Brain Surgery 2. History of Foot Surgery 3. History of Lymphadenectomy  Current Meds 1. Aspirin 81 MG TABS;  Therapy: (Recorded:05Dec2016) to Recorded 2. Fish Oil CAPS;  Therapy: (Recorded:05Dec2016) to Recorded 3. Invokana 100 MG Oral Tablet;  Therapy: (Recorded:05Dec2016) to Recorded 4. MetFORMIN HCl - 500 MG Oral Tablet;  Therapy: (Recorded:05Dec2016) to Recorded 5. Simvastatin 20 MG Oral Tablet;  Therapy: (Recorded:05Dec2016) to Recorded 6. Valsartan 320 MG Oral Tablet;  Therapy: (Recorded:05Dec2016) to Recorded 7. Vitamin D TABS;  Therapy: (Recorded:05Dec2016) to Recorded  Allergies Medication  1. Contrast Media Ready-Box MISC  Family History Problems  1. Family history of Emphysema/COPD : Father 2. Family history of diabetes mellitus (Z83.3) : Brother 3. Family history of myocardial infarction  (Z82.49) : Brother  Social History Problems  1. Denied: History of Alcohol use 2. Caffeine use (F15.90)   2-3 per day 3. Former smoker [phone])   1 ppd for 23 years, quit 20 years ago. 4. Married 5. Number of children   2 daughters 6. Occupation   Clinical biochemist  Past and social history reviewed and updated.   Review of Systems  Genitourinary: urinary frequency.  Gastrointestinal: no diarrhea and no constipation.  Constitutional: no recent weight loss.  Cardiovascular: no chest pain.  Respiratory: no shortness of breath.    Vitals Vital Signs [Data Includes: Last 1 Day]  Recorded: 23Feb2017 08:31AM  Blood Pressure: 118 / 70 Temperature: 97.9 F Heart Rate: 56  Physical Exam Constitutional: Well nourished and well developed . No acute distress.  Pulmonary: No respiratory distress and normal respiratory rhythm and effort.  Cardiovascular: Heart rate and rhythm are normal . No peripheral edema.  Abdomen: The abdomen is mildly obese. The abdomen is soft and nontender. No masses are palpated. No CVA tenderness. No hernias are palpable. No hepatosplenomegaly noted.  Genitourinary: Examination of the penis demonstrates balanitis (minimal erythema dorsally), but no discharge, no masses, no lesions and a normal meatus. The penis is uncircumcised. The scrotum is without lesions. The right epididymis is palpably normal and non-tender. The left epididymis is palpably normal and non-tender. The right testis is atrophic, but non-tender and without masses. The left testis is atrophic, but non-tender and without masses. the atrophy is mild.  Lymphatics: The femoral and inguinal nodes are not enlarged or tender.  Skin: Normal skin turgor, no visible rash and no visible skin lesions.  Neuro/Psych:. Mood and affect are appropriate.    Results/Data   The following images/tracing/specimen were independently visualized:  I have reviewed  his CT films from today. He has a right renal cyst  and small stable hyperdense right lower pole lesions that were evaluated with CT and MRI in 2015 and are unchanged.  The following clinical lab reports were reviewed:  UA reviewed. Selected Results  BUN & CREATININE [id number] 08:59AM Irine Seal  SPECIMEN TYPE: BLOOD   Test Name Result Flag Reference  CREATININE 1.10 mg/dL  0.50-1.50  BUN 16 mg/dL  6-30  Est GFR, African American 85 mL/min  >=60  Est GFR, NonAfrican American 74 mL/min  >=60  PERFORMED AT:        [facility]                      [place]                      , [place] [address]   THE ESTIMATED GFR IS A CALCULATION VALID FOR ADULTS (>=39 YEARS OLD) THAT USES THE CKD-EPI ALGORITHM TO ADJUST FOR AGE AND SEX. IT IS   NOT TO BE USED FOR CHILDREN, PREGNANT WOMEN, HOSPITALIZED PATIENTS,    PATIENTS ON DIALYSIS, OR WITH RAPIDLY CHANGING KIDNEY FUNCTION. ACCORDING TO THE NKDEP, EGFR >89 IS NORMAL, 60-89 SHOWS MILD IMPAIRMENT, 30-59 SHOWS MODERATE IMPAIRMENT, 15-29 SHOWS SEVERE IMPAIRMENT AND <15 IS ESRD.   UA With REFLEX [id number] 08:23AM Irine Seal  SPECIMEN TYPE: CLEAN CATCH   Test Name Result Flag Reference  COLOR YELLOW  YELLOW  ** PLEASE NOTE CHANGE IN UNIT OF MEASURE AND REFERENCE RANGE(S). **  APPEARANCE CLEAR  CLEAR  SPECIFIC GRAVITY 1.020  1.001-1.035  pH 6.0  5.0-8.0  GLUCOSE 3+ A NEGATIVE  BILIRUBIN NEGATIVE  NEGATIVE  KETONE NEGATIVE  NEGATIVE  BLOOD 2+ A NEGATIVE  PROTEIN NEGATIVE  NEGATIVE  NITRITE NEGATIVE  NEGATIVE  LEUKOCYTE ESTERASE NEGATIVE  NEGATIVE  SQUAMOUS EPITHELIAL/HPF NONE SEEN HPF  <=5  WBC NONE SEEN WBC/HPF  <=5  RBC 3-10 RBC/HPF A <=2  BACTERIA NONE SEEN HPF  NONE SEEN  CRYSTALS NONE SEEN HPF  NONE SEEN  CASTS NONE SEEN LPF  NONE SEEN  Other     SPERMATOZOA PRESENT.  Yeast NONE SEEN HPF  NONE SEEN    24 Jul 2015 8:23 AM  UA With REFLEX    COLOR YELLOW     APPEARANCE CLEAR     SPECIFIC GRAVITY 1.020     pH 6.0     GLUCOSE 3+     BILIRUBIN NEGATIVE      KETONE NEGATIVE     BLOOD 2+     PROTEIN NEGATIVE     NITRITE NEGATIVE     LEUKOCYTE ESTERASE NEGATIVE     WBC NONE SEEN     RBC 3-10     SQUAMOUS EPITHELIAL/HPF NONE SEEN     BACTERIA NONE SEEN     CRYSTALS NONE SEEN     CASTS NONE SEEN     Yeast NONE SEEN     Other  Assessment Assessed  1. Asymptomatic microscopic hematuria (R31.21) 2. Erectile dysfunction due to arterial insufficiency (N52.01) 3. 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 Benign prostate hyperplasia (N40.0)  He had a new finding of microhematuria today and CT scanning shows an enlarged prostate but no bladder lesions. He has stable right renal lesions.  He is otherwise ok to proceed with the IPP placement.   Plan Asymptomatic microscopic hematuria  1. AU CT-HEMATURIA PROTOCOL; Status:In Progress - Specimen/Data Collected;  Done:  [id number] 09:46AM 2. VENIPUNCTURE; Status:Complete;   Done: [id number]  I will add cystoscopy to the procedure list for tuesday to complete the hematuria w/u and reviewed that procedure with him.   I reviewed the risks and postoperative restrictions for the IPP placement as well.  We will proceed on Tuesday.   Discussion/Summary CC: Dr. Janie Morning

## 2015-07-29 ENCOUNTER — Encounter (HOSPITAL_COMMUNITY)
Admission: RE | Disposition: A | Discharge status: Home / Self Care | Payer: Self-pay | Source: Ambulatory Visit | Attending: Urology

## 2015-07-29 ENCOUNTER — Ambulatory Visit (HOSPITAL_COMMUNITY): Admitting: Anesthesiology

## 2015-07-29 ENCOUNTER — Observation Stay (HOSPITAL_COMMUNITY)
Admission: RE | Admit: 2015-07-29 | Discharge: 2015-07-30 | Disposition: A | Discharge status: Home / Self Care | Source: Ambulatory Visit | Attending: Urology | Admitting: Urology

## 2015-07-29 ENCOUNTER — Encounter (HOSPITAL_COMMUNITY): Payer: Self-pay | Admitting: Anesthesiology

## 2015-07-29 DIAGNOSIS — R3129 Other microscopic hematuria: Secondary | ICD-10-CM | POA: Diagnosis not present

## 2015-07-29 DIAGNOSIS — Z87891 Personal history of nicotine dependence: Secondary | ICD-10-CM | POA: Insufficient documentation

## 2015-07-29 DIAGNOSIS — Z7984 Long term (current) use of oral hypoglycemic drugs: Secondary | ICD-10-CM | POA: Diagnosis not present

## 2015-07-29 DIAGNOSIS — G4730 Sleep apnea, unspecified: Secondary | ICD-10-CM | POA: Diagnosis not present

## 2015-07-29 DIAGNOSIS — D3001 Benign neoplasm of right kidney: Secondary | ICD-10-CM | POA: Insufficient documentation

## 2015-07-29 DIAGNOSIS — Z7982 Long term (current) use of aspirin: Secondary | ICD-10-CM | POA: Diagnosis not present

## 2015-07-29 DIAGNOSIS — Z85841 Personal history of malignant neoplasm of brain: Secondary | ICD-10-CM | POA: Insufficient documentation

## 2015-07-29 DIAGNOSIS — E7800 Pure hypercholesterolemia, unspecified: Secondary | ICD-10-CM | POA: Insufficient documentation

## 2015-07-29 DIAGNOSIS — R350 Frequency of micturition: Secondary | ICD-10-CM | POA: Insufficient documentation

## 2015-07-29 DIAGNOSIS — E291 Testicular hypofunction: Secondary | ICD-10-CM | POA: Insufficient documentation

## 2015-07-29 DIAGNOSIS — Z23 Encounter for immunization: Secondary | ICD-10-CM | POA: Insufficient documentation

## 2015-07-29 DIAGNOSIS — N401 Enlarged prostate with lower urinary tract symptoms: Secondary | ICD-10-CM | POA: Insufficient documentation

## 2015-07-29 DIAGNOSIS — Z79899 Other long term (current) drug therapy: Secondary | ICD-10-CM | POA: Diagnosis not present

## 2015-07-29 DIAGNOSIS — E119 Type 2 diabetes mellitus without complications: Secondary | ICD-10-CM | POA: Insufficient documentation

## 2015-07-29 DIAGNOSIS — N5201 Erectile dysfunction due to arterial insufficiency: Principal | ICD-10-CM | POA: Insufficient documentation

## 2015-07-29 DIAGNOSIS — N529 Male erectile dysfunction, unspecified: Secondary | ICD-10-CM | POA: Diagnosis present

## 2015-07-29 DIAGNOSIS — I10 Essential (primary) hypertension: Secondary | ICD-10-CM | POA: Diagnosis not present

## 2015-07-29 HISTORY — PX: CYSTOSCOPY: SHX5120

## 2015-07-29 HISTORY — PX: PENILE PROSTHESIS IMPLANT: SHX240

## 2015-07-29 LAB — GLUCOSE, CAPILLARY
GLUCOSE-CAPILLARY: 127 mg/dL — AB (ref 65–99)
GLUCOSE-CAPILLARY: 117 mg/dL — AB (ref 65–99)
GLUCOSE-CAPILLARY: 137 mg/dL — AB (ref 65–99)
GLUCOSE-CAPILLARY: 157 mg/dL — AB (ref 65–99)
Glucose-Capillary: 128 mg/dL — ABNORMAL HIGH (ref 65–99)

## 2015-07-29 SURGERY — INSERTION, PENILE PROSTHESIS
Anesthesia: General

## 2015-07-29 MED ORDER — INSULIN ASPART 100 UNIT/ML ~~LOC~~ SOLN
0.0000 [IU] | Freq: Three times a day (TID) | SUBCUTANEOUS | Status: DC
  Administered 2015-07-29 – 2015-07-30 (×2): 2 [IU] via SUBCUTANEOUS

## 2015-07-29 MED ORDER — PROPOFOL 10 MG/ML IV BOLUS
INTRAVENOUS | Status: DC | PRN
  Administered 2015-07-29: 200 mg via INTRAVENOUS
  Administered 2015-07-29: 50 mg via INTRAVENOUS
  Administered 2015-07-29: 100 mg via INTRAVENOUS

## 2015-07-29 MED ORDER — VALSARTAN-HYDROCHLOROTHIAZIDE 320-12.5 MG PO TABS: 1.0000 | ORAL_TABLET | Freq: Every day | ORAL | Status: DC

## 2015-07-29 MED ORDER — EPHEDRINE SULFATE 50 MG/ML IJ SOLN
INTRAMUSCULAR | Status: DC | PRN
  Administered 2015-07-29: 25 mg via INTRAVENOUS

## 2015-07-29 MED ORDER — SODIUM CHLORIDE 0.9 % IR SOLN
Status: DC | PRN
  Administered 2015-07-29: 1000 mL

## 2015-07-29 MED ORDER — MIDAZOLAM HCL 2 MG/2ML IJ SOLN
INTRAMUSCULAR | Status: AC
  Filled 2015-07-29: qty 2

## 2015-07-29 MED ORDER — SENNA 8.6 MG PO TABS
1.0000 | ORAL_TABLET | Freq: Two times a day (BID) | ORAL | Status: DC
  Administered 2015-07-29 – 2015-07-30 (×3): 8.6 mg via ORAL
  Filled 2015-07-29 (×3): qty 1

## 2015-07-29 MED ORDER — ONDANSETRON HCL 4 MG/2ML IJ SOLN
INTRAMUSCULAR | Status: AC
  Filled 2015-07-29: qty 2

## 2015-07-29 MED ORDER — STERILE WATER FOR IRRIGATION IR SOLN
Status: DC | PRN
  Administered 2015-07-29: 5 mL

## 2015-07-29 MED ORDER — MEPERIDINE HCL 50 MG/ML IJ SOLN: 6.2500 mg | INTRAMUSCULAR | Status: DC | PRN

## 2015-07-29 MED ORDER — FENTANYL CITRATE (PF) 100 MCG/2ML IJ SOLN
25.0000 ug | INTRAMUSCULAR | Status: DC | PRN
  Administered 2015-07-29 (×4): 25 ug via INTRAVENOUS

## 2015-07-29 MED ORDER — ONDANSETRON HCL 4 MG/2ML IJ SOLN
INTRAMUSCULAR | Status: DC | PRN
  Administered 2015-07-29: 4 mg via INTRAVENOUS

## 2015-07-29 MED ORDER — LIDOCAINE HCL (CARDIAC) 20 MG/ML IV SOLN
INTRAVENOUS | Status: DC | PRN
  Administered 2015-07-29: 100 mg via INTRAVENOUS

## 2015-07-29 MED ORDER — NEOSTIGMINE METHYLSULFATE 10 MG/10ML IV SOLN
INTRAVENOUS | Status: AC
  Filled 2015-07-29: qty 1

## 2015-07-29 MED ORDER — PROMETHAZINE HCL 25 MG/ML IJ SOLN: 6.2500 mg | INTRAMUSCULAR | Status: DC | PRN

## 2015-07-29 MED ORDER — SODIUM CHLORIDE 0.9 % IR SOLN
Status: DC | PRN
  Administered 2015-07-29: 200 mL

## 2015-07-29 MED ORDER — CHLORHEXIDINE GLUCONATE 4 % EX LIQD: Freq: Once | CUTANEOUS | Status: DC

## 2015-07-29 MED ORDER — INFLUENZA VAC SPLIT QUAD 0.5 ML IM SUSY
0.5000 mL | PREFILLED_SYRINGE | INTRAMUSCULAR | Status: AC
  Administered 2015-07-30: 0.5 mL via INTRAMUSCULAR
  Filled 2015-07-29 (×2): qty 0.5

## 2015-07-29 MED ORDER — EPHEDRINE SULFATE 50 MG/ML IJ SOLN
INTRAMUSCULAR | Status: AC
  Filled 2015-07-29: qty 1

## 2015-07-29 MED ORDER — DOCUSATE SODIUM 100 MG PO CAPS
100.0000 mg | ORAL_CAPSULE | Freq: Two times a day (BID) | ORAL | Status: DC
  Administered 2015-07-29 – 2015-07-30 (×3): 100 mg via ORAL
  Filled 2015-07-29 (×3): qty 1

## 2015-07-29 MED ORDER — IRBESARTAN 300 MG PO TABS
300.0000 mg | ORAL_TABLET | Freq: Every day | ORAL | Status: DC
Start: 2015-07-29 — End: 2015-07-30
  Administered 2015-07-29 – 2015-07-30 (×2): 300 mg via ORAL
  Filled 2015-07-29 (×2): qty 1

## 2015-07-29 MED ORDER — FENTANYL CITRATE (PF) 250 MCG/5ML IJ SOLN
INTRAMUSCULAR | Status: AC
  Filled 2015-07-29: qty 5

## 2015-07-29 MED ORDER — MORPHINE SULFATE (PF) 2 MG/ML IV SOLN
2.0000 mg | INTRAVENOUS | Status: DC | PRN
  Administered 2015-07-29 (×2): 2 mg via INTRAVENOUS
  Filled 2015-07-29 (×2): qty 1

## 2015-07-29 MED ORDER — HYDROCHLOROTHIAZIDE 12.5 MG PO CAPS
12.5000 mg | ORAL_CAPSULE | Freq: Every day | ORAL | Status: DC
  Administered 2015-07-29 – 2015-07-30 (×2): 12.5 mg via ORAL
  Filled 2015-07-29 (×2): qty 1

## 2015-07-29 MED ORDER — DEXTROSE-NACL 5-0.45 % IV SOLN
INTRAVENOUS | Status: DC
  Administered 2015-07-29 – 2015-07-30 (×3): via INTRAVENOUS

## 2015-07-29 MED ORDER — LACTATED RINGERS IV SOLN: INTRAVENOUS | Status: DC

## 2015-07-29 MED ORDER — OXYBUTYNIN CHLORIDE 5 MG PO TABS: 5.0000 mg | ORAL_TABLET | Freq: Three times a day (TID) | ORAL | Status: DC | PRN

## 2015-07-29 MED ORDER — OXYCODONE HCL 5 MG PO TABS
5.0000 mg | ORAL_TABLET | ORAL | Status: DC | PRN
  Administered 2015-07-29: 5 mg via ORAL
  Administered 2015-07-29 – 2015-07-30 (×3): 10 mg via ORAL
  Filled 2015-07-29 (×2): qty 2
  Filled 2015-07-29: qty 1
  Filled 2015-07-29: qty 2

## 2015-07-29 MED ORDER — LACTATED RINGERS IV SOLN
INTRAVENOUS | Status: DC
  Administered 2015-07-29 (×2): via INTRAVENOUS

## 2015-07-29 MED ORDER — INSULIN ASPART 100 UNIT/ML ~~LOC~~ SOLN: 0.0000 [IU] | Freq: Every day | SUBCUTANEOUS | Status: DC

## 2015-07-29 MED ORDER — FENTANYL CITRATE (PF) 100 MCG/2ML IJ SOLN
INTRAMUSCULAR | Status: DC | PRN
  Administered 2015-07-29 (×5): 50 ug via INTRAVENOUS

## 2015-07-29 MED ORDER — SODIUM CHLORIDE 0.9 % IR SOLN
Status: AC
  Filled 2015-07-29 (×2): qty 1

## 2015-07-29 MED ORDER — DEXAMETHASONE SODIUM PHOSPHATE 10 MG/ML IJ SOLN
INTRAMUSCULAR | Status: AC
  Filled 2015-07-29: qty 1

## 2015-07-29 MED ORDER — MIDAZOLAM HCL 5 MG/5ML IJ SOLN
INTRAMUSCULAR | Status: DC | PRN
  Administered 2015-07-29: 2 mg via INTRAVENOUS

## 2015-07-29 MED ORDER — ONDANSETRON HCL 4 MG/2ML IJ SOLN: 4.0000 mg | INTRAMUSCULAR | Status: DC | PRN

## 2015-07-29 MED ORDER — PROPOFOL 10 MG/ML IV BOLUS
INTRAVENOUS | Status: AC
  Filled 2015-07-29: qty 40

## 2015-07-29 MED ORDER — FENTANYL CITRATE (PF) 100 MCG/2ML IJ SOLN
INTRAMUSCULAR | Status: AC
  Filled 2015-07-29: qty 2

## 2015-07-29 MED ORDER — GLYCOPYRROLATE 0.2 MG/ML IJ SOLN
INTRAMUSCULAR | Status: AC
  Filled 2015-07-29: qty 3

## 2015-07-29 MED ORDER — AMOXICILLIN-POT CLAVULANATE 875-125 MG PO TABS
1.0000 | ORAL_TABLET | Freq: Two times a day (BID) | ORAL | Status: DC
  Administered 2015-07-29 – 2015-07-30 (×2): 1 via ORAL
  Filled 2015-07-29 (×2): qty 1

## 2015-07-29 SURGICAL SUPPLY — 64 items
APL SKNCLS STERI-STRIP NONHPOA (GAUZE/BANDAGES/DRESSINGS)
BAG URINE DRAINAGE (UROLOGICAL SUPPLIES) ×3 IMPLANT
BAG URO CATCHER STRL LF (MISCELLANEOUS) ×1 IMPLANT
BANDAGE COBAN STERILE 2 (GAUZE/BANDAGES/DRESSINGS) ×2 IMPLANT
BENZOIN TINCTURE PRP APPL 2/3 (GAUZE/BANDAGES/DRESSINGS) ×1 IMPLANT
BLADE HEX COATED 2.75 (ELECTRODE) ×3 IMPLANT
BLADE SURG 15 STRL LF DISP TIS (BLADE) ×2 IMPLANT
BLADE SURG 15 STRL SS (BLADE) ×6
BNDG GAUZE ELAST 4 BULKY (GAUZE/BANDAGES/DRESSINGS) ×2 IMPLANT
CATH FOLEY 2WAY SLVR  5CC 16FR (CATHETERS) ×2
CATH FOLEY 2WAY SLVR 5CC 16FR (CATHETERS) ×1 IMPLANT
CATH ROBINSON RED A/P 16FR (CATHETERS) IMPLANT
CATH URET 5FR 28IN OPEN ENDED (CATHETERS) IMPLANT
CHLORAPREP W/TINT 26ML (MISCELLANEOUS) ×2 IMPLANT
CLOSURE WOUND 1/2 X4 (GAUZE/BANDAGES/DRESSINGS)
CLOTH BEACON ORANGE TIMEOUT ST (SAFETY) ×3 IMPLANT
COVER MAYO STAND STRL (DRAPES) ×3 IMPLANT
COVER SURGICAL LIGHT HANDLE (MISCELLANEOUS) ×3 IMPLANT
DISSECTOR ROUND CHERRY 3/8 STR (MISCELLANEOUS) ×3 IMPLANT
DRAIN CHANNEL 10F 3/8 F FF (DRAIN) ×2 IMPLANT
DRAPE LAPAROTOMY T 102X78X121 (DRAPES) ×3 IMPLANT
DRAPE UTILITY XL STRL (DRAPES) ×3 IMPLANT
DRSG TEGADERM 4X4.75 (GAUZE/BANDAGES/DRESSINGS) ×5 IMPLANT
ELECT REM PT RETURN 9FT ADLT (ELECTROSURGICAL) ×3
ELECTRODE REM PT RTRN 9FT ADLT (ELECTROSURGICAL) ×1 IMPLANT
EVACUATOR SILICONE 100CC (DRAIN) ×2 IMPLANT
GAUZE SPONGE 4X4 12PLY STRL (GAUZE/BANDAGES/DRESSINGS) IMPLANT
GLOVE SURG SS PI 8.0 STRL IVOR (GLOVE) IMPLANT
GOWN STRL REUS W/TWL XL LVL3 (GOWN DISPOSABLE) ×6 IMPLANT
KIT BASIN OR (CUSTOM PROCEDURE TRAY) ×3 IMPLANT
KIT TITAN ASSEMBLY (Erectile Restoration) ×2 IMPLANT
KIT TITAN ASSEMBLY STANDARD (Erectile Restoration) ×1 IMPLANT
KIT TITAN ASSEMBLY STD (Erectile Restoration) IMPLANT
LIQUID BAND (GAUZE/BANDAGES/DRESSINGS) ×2 IMPLANT
MANIFOLD NEPTUNE II (INSTRUMENTS) ×3 IMPLANT
NS IRRIG 1000ML POUR BTL (IV SOLUTION) ×1 IMPLANT
PACK CYSTO (CUSTOM PROCEDURE TRAY) ×3 IMPLANT
PACK GENERAL/GYN (CUSTOM PROCEDURE TRAY) ×3 IMPLANT
PLUG CATH AND CAP STER (CATHETERS) ×3 IMPLANT
PROS TITAN INFRA 0 ANG 20CM (Erectile Restoration) ×3 IMPLANT
PROSTHESIS TTN INFR 0 ANG 20CM (Erectile Restoration) IMPLANT
RESERVOIR TITAN 12.5CC W/VLV ×2 IMPLANT
RETRACTOR WILSON SYSTEM (INSTRUMENTS) IMPLANT
SET CYSTO W/LG BORE CLAMP LF (SET/KITS/TRAYS/PACK) ×2 IMPLANT
SOL PREP POV-IOD 4OZ 10% (MISCELLANEOUS) ×3 IMPLANT
SOL PREP PROV IODINE SCRUB 4OZ (MISCELLANEOUS) ×1 IMPLANT
SPONGE DRAIN TRACH 4X4 STRL 2S (GAUZE/BANDAGES/DRESSINGS) ×2 IMPLANT
SPONGE LAP 4X18 X RAY DECT (DISPOSABLE) IMPLANT
STRIP CLOSURE SKIN 1/2X4 (GAUZE/BANDAGES/DRESSINGS) ×1 IMPLANT
SUPPORT SCROTAL LG STRP (MISCELLANEOUS) ×1 IMPLANT
SUPPORTER ATHLETIC LG (MISCELLANEOUS)
SURGILUBE 3G PEEL PACK STRL (MISCELLANEOUS) ×5 IMPLANT
SUT CHROMIC 3 0 SH 27 (SUTURE) ×5 IMPLANT
SUT ETHILON 3 0 PS 1 (SUTURE) ×2 IMPLANT
SUT MNCRL AB 4-0 PS2 18 (SUTURE) ×3 IMPLANT
SUT PDS AB 2-0 CT2 27 (SUTURE) ×12 IMPLANT
SUT VIC AB 2-0 UR6 27 (SUTURE) ×4 IMPLANT
SYR 20CC LL (SYRINGE) ×3 IMPLANT
SYR 50ML LL SCALE MARK (SYRINGE) ×6 IMPLANT
TOWEL OR 17X26 10 PK STRL BLUE (TOWEL DISPOSABLE) ×3 IMPLANT
TOWEL OR NON WOVEN STRL DISP B (DISPOSABLE) ×3 IMPLANT
TUBING CONNECTING 10 (TUBING) ×1 IMPLANT
TUBING CONNECTING 10' (TUBING)
WATER STERILE IRR 1500ML POUR (IV SOLUTION) ×3 IMPLANT

## 2015-07-29 NOTE — Anesthesia Procedure Notes (Signed)
Procedure Name: LMA Insertion Date/Time: 07/29/2015 7:34 AM Performed by: Wanita Chamberlain Pre-anesthesia Checklist: Patient identified, Emergency Drugs available, Suction available, Patient being monitored and Timeout performed Patient Re-evaluated:Patient Re-evaluated prior to inductionOxygen Delivery Method: Circle system utilized Preoxygenation: Pre-oxygenation with 100% oxygen Intubation Type: IV induction Ventilation: Two handed mask ventilation required LMA: LMA inserted LMA Size: 5.0 Number of attempts: 1 Placement Confirmation: breath sounds checked- equal and bilateral,  CO2 detector,  positive ETCO2 and ETT inserted through vocal cords under direct vision Tube secured with: Tape Dental Injury: Teeth and Oropharynx as per pre-operative assessment

## 2015-07-29 NOTE — Anesthesia Preprocedure Evaluation (Addendum)
Anesthesia Evaluation  Patient identified by MRN, date of birth, ID band Patient awake    Reviewed: Allergy & Precautions, H&P , NPO status , Patient's Chart, lab work & pertinent test results  Airway Mallampati: II  TM Distance: <3 FB Neck ROM: Full    Dental no notable dental hx. (+) Teeth Intact, Dental Advisory Given   Pulmonary neg pulmonary ROS, sleep apnea and Continuous Positive Airway Pressure Ventilation , former smoker,    Pulmonary exam normal breath sounds clear to auscultation       Cardiovascular hypertension, Pt. on medications negative cardio ROS Normal cardiovascular exam Rhythm:Regular Rate:Normal     Neuro/Psych H/o brain tumor. In remission now negative neurological ROS  negative psych ROS   GI/Hepatic negative GI ROS, Neg liver ROS,   Endo/Other  diabetes, Type 2, Oral Hypoglycemic Agents  Renal/GU negative Renal ROS  negative genitourinary   Musculoskeletal negative musculoskeletal ROS (+)   Abdominal   Peds negative pediatric ROS (+)  Hematology negative hematology ROS (+)   Anesthesia Other Findings   Reproductive/Obstetrics negative OB ROS                           Anesthesia Physical  Anesthesia Plan  ASA: II  Anesthesia Plan: General   Post-op Pain Management:    Induction: Intravenous  Airway Management Planned: Oral ETT and LMA  Additional Equipment:   Intra-op Plan:   Post-operative Plan: Extubation in OR  Informed Consent: I have reviewed the patients History and Physical, chart, labs and discussed the procedure including the risks, benefits and alternatives for the proposed anesthesia with the patient or authorized representative who has indicated his/her understanding and acceptance.   Dental advisory given  Plan Discussed with: CRNA  Anesthesia Plan Comments:         Anesthesia Quick Evaluation

## 2015-07-29 NOTE — Progress Notes (Signed)
Patient ambulated around 150 feet this shift- tolerated well. Pain well managed. Also sat up in chair for a while this afternoon. O2 Sats stable on 2L/[place].

## 2015-07-29 NOTE — Interval H&P Note (Signed)
History and Physical Interval Note:  07/29/2015 7:20 AM  Randall Lewis  has presented today for surgery, with the diagnosis of ERECTILE DYSFUNCTION  The various methods of treatment have been discussed with the patient and family. After consideration of risks, benefits and other options for treatment, the patient has consented to  Procedure(s): PENILE PROSTHESIS (N/A) FLEXIBLE CYSTOSCOPY (N/A) as a surgical intervention .  The patient's history has been reviewed, patient examined, no change in status, stable for surgery.  I have reviewed the patient's chart and labs.  Questions were answered to the patient's satisfaction.     [name]

## 2015-07-29 NOTE — Anesthesia Postprocedure Evaluation (Signed)
Anesthesia Post Note  Patient: Randall Lewis  Procedure(s) Performed: Procedure(s) (LRB): PENILE PROSTHESIS (N/A) FLEXIBLE CYSTOSCOPY (N/A)  Patient location during evaluation: PACU Anesthesia Type: General Level of consciousness: awake and alert Pain management: pain level controlled Vital Signs Assessment: post-procedure vital signs reviewed and stable Respiratory status: spontaneous breathing, nonlabored ventilation, respiratory function stable and patient connected to nasal cannula oxygen Cardiovascular status: blood pressure returned to baseline and stable Postop Assessment: no signs of nausea or vomiting Anesthetic complications: no    Last Vitals:  Filed Vitals:   07/29/15 1215 07/29/15 1231  BP: 142/65 137/78  Pulse: 66 65  Temp: 36.4 C 36.5 C  Resp: 18 14    Last Pain:  Filed Vitals:   07/29/15 1307  PainSc: 5                  Montez Hageman

## 2015-07-29 NOTE — Transfer of Care (Signed)
Immediate Anesthesia Transfer of Care Note  Patient: Randall Lewis  Procedure(s) Performed: Procedure(s): PENILE PROSTHESIS (N/A) FLEXIBLE CYSTOSCOPY (N/A)  Patient Location: PACU  Anesthesia Type:General  Level of Consciousness: awake, alert , oriented and patient cooperative  Airway & Oxygen Therapy: Patient Spontanous Breathing and Patient connected to face mask oxygen  Post-op Assessment: Report given to RN and Post -op Vital signs reviewed and stable  Post vital signs: Reviewed and stable BP 139/62 75-16 Sa O2 99%  Last Vitals:  Filed Vitals:   07/29/15 0545  BP: 138/83  Pulse: 61  Temp: 36.4 C  Resp: 16    Complications: No apparent anesthesia complications

## 2015-07-29 NOTE — Op Note (Signed)
Preoperative diagnosis:  1. Erectile dysfunction, vasculogenic 2. Microscopic Hematuria  Postoperative diagnosis: 1. Same 2. Same  Procedure(s): 1. Inflatable penile prosthesis 2. Cystoscopy  Surgeon: Dr.[name] Jeffie Pollock, MD Resident: Dr. Jearld Adjutant, MD  Anesthesia: General  Complications: None  EBL: 25 ml  Specimens: None  Intraoperative findings: Cystoscopy showed bilobar hypertrophy with small median lobe. There was mild trabeculation of the bladder with no lesions. Bilateral UOs were in place and anatomic.  Proximal corpora measured 12 cm, distal corpora measured 10 cm bilaterally.  Indication: 60 y.o. male with ED and microscopic hematuria here for IPP and cystoscopy.  Description of procedure: The patient was brought into the OR and placed in supine position. Anesthesia was induced and the patient completed his Vancomycin and Gent antibiotics. The patient was prepped and draped for flexible cystoscopy. We performed a time-out prior to starting the procedure.  We performed flexible cystoscopy with findings described above. At the conclusion of the cystoscopy we re-prepped and draped the patient with an extended prep including betadine and chlorhexidine to ensure maximal prevention of infection. A foley catheter was placed in the field.   We began by making a suprpubic incision and carried this down to the pubic bone. We were able to bluntly dissect out the corpora bilaterally and expose the tunica. 2 2-0 PDS stay sutures were placed in each corpora and an incision was made between the sutures. Gentle dilation of the corpora was performed with the [facility] and we were able to measure the corpora as described above.  We turned our attention to the reservoir and made a 2 cm vertical incision in the fascia covering the rectus. We were then able to make a pocket between the rectus and the peritoneum for placement of the reservoir in a midline location. The reservoir was inflated with  about 85 ml of sterile water and the fascia closed over top with 2-0 vicryl.   We turned our attention to the left corpora and placed a 20 cm prosthesis with 1 1/2 cm rear tip extender via a Furlow and keith needle through the glans paying careful attention not to injure the urethra. This was repeated on the right side. Both prosthetics seated well and the penis had good shape on test inflation. The corporotomies were then closed with the previously placed PDS.   A dartos pouch was created with blunt dissection to the right hemiscrotum and the pump was placed in the scrotum with the deflation button palpable facing out. The tubing was then attached between the reservoir and prosthesis and the pump was used to inflate the prosthesis that worked Magazine features editor. The prosthesis was then deflated and left partially full for recovery.  We made a separate stab incision to place a JP drain. We then closed the overlying scarpa's with chromic and we closed the skin with monocryl in a subcuticular fashion. This was dressed with Dermabond, Dermabond was used on the 2 glanular wounds from the keith needles. The penis and scrotum were then wrapped in [facility] fashion to prevent swelling.  Plan Admit to urology overnight for observation.

## 2015-07-30 DIAGNOSIS — N5201 Erectile dysfunction due to arterial insufficiency: Secondary | ICD-10-CM | POA: Diagnosis not present

## 2015-07-30 LAB — BASIC METABOLIC PANEL
ANION GAP: 9 (ref 5–15)
BUN: 10 mg/dL (ref 6–20)
CO2: 28 mmol/L (ref 22–32)
Calcium: 9 mg/dL (ref 8.9–10.3)
Chloride: 102 mmol/L (ref 101–111)
Creatinine, Ser: 0.91 mg/dL (ref 0.61–1.24)
GFR calc Af Amer: >60 mL/min (ref >60)
GFR calc non Af Amer: >60 mL/min (ref >60)
GLUCOSE: 143 mg/dL — AB (ref 65–99)
POTASSIUM: 3.7 mmol/L (ref 3.5–5.1)
Sodium: 139 mmol/L (ref 135–145)

## 2015-07-30 LAB — HEMOGLOBIN AND HEMATOCRIT, BLOOD
HCT: 51.1 % (ref 39.0–52.0)
Hemoglobin: 17.3 g/dL — ABNORMAL HIGH (ref 13.0–17.0)

## 2015-07-30 LAB — GLUCOSE, CAPILLARY: GLUCOSE-CAPILLARY: 130 mg/dL — AB (ref 65–99)

## 2015-07-30 MED ORDER — OXYCODONE HCL 5 MG PO TABS: 5.0000 mg | ORAL_TABLET | Freq: Four times a day (QID) | ORAL | Status: DC | PRN

## 2015-07-30 MED ORDER — SENNA 8.6 MG PO TABS: 1.0000 | ORAL_TABLET | Freq: Two times a day (BID) | ORAL | Status: DC

## 2015-07-30 MED ORDER — AMOXICILLIN-POT CLAVULANATE 875-125 MG PO TABS: 1.0000 | ORAL_TABLET | Freq: Two times a day (BID) | ORAL | Status: DC

## 2015-07-30 MED ORDER — DOCUSATE SODIUM 100 MG PO CAPS: 100.0000 mg | ORAL_CAPSULE | Freq: Two times a day (BID) | ORAL | Status: DC

## 2015-07-30 NOTE — Discharge Summary (Signed)
Date of admission: 07/29/2015  Date of discharge: 07/30/2015  Admission diagnosis: Erectile dysfunction   Discharge diagnosis: Same  Secondary diagnoses: Micro Hematuria  History and Physical: For full details, please see admission history and physical. Briefly, Randall Lewis is a 60 y.o. year old patient with ED here for IPP and flexible cystoscopy.   Hospital Course: The patient underwent IPP and cysto on 07/29/15. He had a normal post operative course. JP output, initially high, came down appropriately and was removed prior to discharge. The patient's foley was removed POD#1 and he voided appropriately. His pain was controlled and he tolerated a regular diet and so was discharged on POD#1.  Laboratory values:   Recent Labs  07/30/15 0447  HGB 17.3*  HCT 51.1    Recent Labs  07/30/15 0447  CREATININE 0.91    Disposition: Home  Discharge instruction: The patient was instructed to be ambulatory but told to refrain from heavy lifting, strenuous activity, or driving.   Discharge medications:    Medication List    TAKE these medications        amoxicillin-clavulanate 875-125 MG tablet  Commonly known as:  AUGMENTIN  Take 1 tablet by mouth every 12 (twelve) hours.     aspirin 81 MG tablet  Take 81 mg by mouth every morning.     docusate sodium 100 MG capsule  Commonly known as:  COLACE  Take 1 capsule (100 mg total) by mouth 2 (two) times daily.     fenofibrate 54 MG tablet  Take 54 mg by mouth every morning. Reported on 05/14/2015     fish oil-omega-3 fatty acids 1000 MG capsule  Take 3 g by mouth daily.     INVOKANA 100 MG Tabs tablet  Generic drug:  canagliflozin  Take 100 mg by mouth daily.     metFORMIN 500 MG 24 hr tablet  Commonly known as:  GLUCOPHAGE-XR  Take 500 mg by mouth daily.     oxyCODONE 5 MG immediate release tablet  Commonly known as:  Oxy IR/ROXICODONE  Take 1-2 tablets (5-10 mg total) by mouth every 6 (six) hours as needed for moderate  pain.     senna 8.6 MG Tabs tablet  Commonly known as:  SENOKOT  Take 1 tablet (8.6 mg total) by mouth 2 (two) times daily.     simvastatin 20 MG tablet  Commonly known as:  ZOCOR  Take 20 mg by mouth daily.     valsartan-hydrochlorothiazide 320-12.5 MG tablet  Commonly known as:  DIOVAN-HCT  Take 1 tablet by mouth daily.     Vitamin D (Ergocalciferol) 50000 units Caps capsule  Commonly known as:  DRISDOL  Take 50,000 Units by mouth every 7 (seven) days.        Followup: as scheduled next week

## 2015-07-30 NOTE — Care Management Note (Signed)
Case Management Note  Patient Details  Name: Randall Lewis MRN: [id number] Date of Birth: 1956-01-15  Subjective/Objective: 60 y/o m admitted w/erectile dysfunction. From home.                   Action/Plan:d/c home no needs or orders.   Expected Discharge Date:                  Expected Discharge Plan:  Home/Self Care  In-House Referral:     Discharge planning Services  CM Consult  Post Acute Care Choice:    Choice offered to:     DME Arranged:    DME Agency:     HH Arranged:    [place] Agency:     Status of Service:  Completed, signed off  Medicare Important Message Given:    Date Medicare IM Given:    Medicare IM give by:    Date Additional Medicare IM Given:    Additional Medicare Important Message give by:     If discussed at [place] of Stay Meetings, dates discussed:    Additional Comments:  Dessa Phi, RN 07/30/2015, 9:48 AM

## 2015-07-30 NOTE — Progress Notes (Signed)
Patient voided 150cc of clear yellow urine after foley catheter removal. Denies his bladder feeling full. Patient is stable for discharge. Discharge instructions and medications have been reviewed with the patient and all questions answered.   Othella Boyer [facility] 07/30/2015 11:57 AM

## 2015-08-02 ENCOUNTER — Encounter (HOSPITAL_COMMUNITY): Payer: Self-pay

## 2015-08-02 ENCOUNTER — Emergency Department (HOSPITAL_COMMUNITY)
Admission: EM | Admit: 2015-08-02 | Discharge: 2015-08-02 | Disposition: A | Discharge status: Home / Self Care | Attending: Emergency Medicine | Admitting: Emergency Medicine

## 2015-08-02 DIAGNOSIS — E119 Type 2 diabetes mellitus without complications: Secondary | ICD-10-CM | POA: Diagnosis not present

## 2015-08-02 DIAGNOSIS — Z7982 Long term (current) use of aspirin: Secondary | ICD-10-CM | POA: Diagnosis not present

## 2015-08-02 DIAGNOSIS — Z9889 Other specified postprocedural states: Secondary | ICD-10-CM | POA: Insufficient documentation

## 2015-08-02 DIAGNOSIS — Z86011 Personal history of benign neoplasm of the brain: Secondary | ICD-10-CM | POA: Insufficient documentation

## 2015-08-02 DIAGNOSIS — R339 Retention of urine, unspecified: Secondary | ICD-10-CM | POA: Insufficient documentation

## 2015-08-02 DIAGNOSIS — Z7984 Long term (current) use of oral hypoglycemic drugs: Secondary | ICD-10-CM | POA: Insufficient documentation

## 2015-08-02 DIAGNOSIS — Z9981 Dependence on supplemental oxygen: Secondary | ICD-10-CM | POA: Insufficient documentation

## 2015-08-02 DIAGNOSIS — G4730 Sleep apnea, unspecified: Secondary | ICD-10-CM | POA: Diagnosis not present

## 2015-08-02 DIAGNOSIS — R338 Other retention of urine: Secondary | ICD-10-CM

## 2015-08-02 DIAGNOSIS — I10 Essential (primary) hypertension: Secondary | ICD-10-CM | POA: Diagnosis not present

## 2015-08-02 DIAGNOSIS — Z87891 Personal history of nicotine dependence: Secondary | ICD-10-CM | POA: Diagnosis not present

## 2015-08-02 DIAGNOSIS — Z79899 Other long term (current) drug therapy: Secondary | ICD-10-CM | POA: Insufficient documentation

## 2015-08-02 DIAGNOSIS — N4889 Other specified disorders of penis: Secondary | ICD-10-CM | POA: Diagnosis not present

## 2015-08-02 DIAGNOSIS — R011 Cardiac murmur, unspecified: Secondary | ICD-10-CM | POA: Diagnosis not present

## 2015-08-02 DIAGNOSIS — Z87438 Personal history of other diseases of male genital organs: Secondary | ICD-10-CM | POA: Insufficient documentation

## 2015-08-02 DIAGNOSIS — E785 Hyperlipidemia, unspecified: Secondary | ICD-10-CM | POA: Insufficient documentation

## 2015-08-02 LAB — URINALYSIS, ROUTINE W REFLEX MICROSCOPIC
BILIRUBIN URINE: NEGATIVE
KETONES UR: NEGATIVE mg/dL
Leukocytes, UA: NEGATIVE
Nitrite: NEGATIVE
PH: 5.5 (ref 5.0–8.0)
Protein, ur: NEGATIVE mg/dL
SPECIFIC GRAVITY, URINE: 1.026 (ref 1.005–1.030)

## 2015-08-02 LAB — URINE MICROSCOPIC-ADD ON

## 2015-08-02 MED ORDER — LIDOCAINE HCL 2 % EX GEL
1.0000 "application " | Freq: Once | CUTANEOUS | Status: AC
  Administered 2015-08-02: 1 via URETHRAL
  Filled 2015-08-02: qty 11

## 2015-08-02 MED ORDER — TAMSULOSIN HCL 0.4 MG PO CAPS: 0.4000 mg | ORAL_CAPSULE | Freq: Every day | ORAL | Status: DC

## 2015-08-02 NOTE — Discharge Instructions (Signed)
Acute Urinary Retention, Male °Acute urinary retention is the temporary inability to urinate. °This is a common problem in older men. As men age their prostates become larger and block the flow of urine from the bladder. This is usually a problem that has come on gradually.  °HOME CARE INSTRUCTIONS °If you are sent home with a Foley catheter and a drainage system, you will need to discuss the best course of action with your health care provider. While the catheter is in, maintain a good intake of fluids. Keep the drainage bag emptied and lower than your catheter. This is so that contaminated urine will not flow back into your bladder, which could lead to a urinary tract infection. °There are two main types of drainage bags. One is a large bag that usually is used at night. It has a good capacity that will allow you to sleep through the night without having to empty it. The second type is called a leg bag. It has a smaller capacity, so it needs to be emptied more frequently. However, the main advantage is that it can be attached by a leg strap and can go underneath your clothing, allowing you the freedom to move about or leave your home. °Only take over-the-counter or prescription medicines for pain, discomfort, or fever as directed by your health care provider.  °SEEK MEDICAL CARE IF: °· You develop a low-grade fever. °· You experience spasms or leakage of urine with the spasms. °SEEK IMMEDIATE MEDICAL CARE IF:  °· You develop chills or fever. °· Your catheter stops draining urine. °· Your catheter falls out. °· You start to develop increased bleeding that does not respond to rest and increased fluid intake. °MAKE SURE YOU: °· Understand these instructions. °· Will watch your condition. °· Will get help right away if you are not doing well or get worse. °  °This information is not intended to replace advice given to you by your health care provider. Make sure you discuss any questions you have with your health care  provider. °  °Document Released: 08/23/2000 Document Revised: 10/01/2014 Document Reviewed: 10/26/2012 °Elsevier Interactive Patient Education ©2016 [employer] ° °

## 2015-08-02 NOTE — ED Notes (Signed)
He states he underwent penile prosthesis implantation earlier this week.  He states the operation and its aftermath have gone well.  He states that he was constipated, however; and took a saline laxative, which made him have a formed b.m.  He states he has been unable to void since having this b.m. And is quite uncomfortable.  He is pacing in the room.

## 2015-08-02 NOTE — ED Provider Notes (Signed)
CSN: [id number]     Arrival date & time 08/02/15  1516 History   First MD Initiated Contact with Patient 08/02/15 1546     Chief Complaint  Patient presents with  . Urinary Retention     (Consider location/radiation/quality/duration/timing/severity/associated sxs/prior Treatment) HPI Comments: Started 1 day ago, unable to pass urine after a bowel movement  Patient is a 60 y.o. male presenting with male genitourinary complaint. The history is provided by the patient.  Male GU Problem Presenting symptoms: no dysuria   Presenting symptoms comment:  Urinary retention Context: spontaneously   Relieved by:  Nothing Worsened by:  Nothing tried Ineffective treatments:  None tried Associated symptoms: penile swelling (mildly swelling since surgery)   Associated symptoms: no abdominal pain, no fever, no hematuria and no vomiting   Risk factors comment:  Penile prosthesis implant surgery   Past Medical History  Diagnosis Date  . Hypertension   . Heart murmur     History of heart murmur  . Hyperlipidemia   . BPH (benign prostatic hyperplasia)   . ED (erectile dysfunction)   . Diabetes mellitus without complication ([place])   . Sleep apnea     USES C-PAP  . 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 Foot bone excision  1989  . Right knee meniscus repair  2010  . Thyroid lobectomy  05/12/2012    Procedure: THYROID LOBECTOMY;  Surgeon: Earnstine Regal, MD;  Location: [facility] ORS;  Service: General;  Laterality: Left;  left thyroid lobectomy  . Brain surgery  2006    BRAIN TUMOR  - CANCER  . Penile prosthesis implant N/A 07/29/2015    Procedure: PENILE PROSTHESIS;  Surgeon: Irine Seal, MD;  Location: [facility] ORS;  Service: Urology;  Laterality: N/A;  . Cystoscopy N/A 07/29/2015    Procedure: FLEXIBLE CYSTOSCOPY;  Surgeon: Irine Seal, MD;  Location: [facility] ORS;  Service: Urology;  Laterality: N/A;   Family  History  Problem Relation Age of Onset  . Cancer Mother     Breast Cancer  . Cancer Brother 5  . Birth defects Son    Social History  Substance Use Topics  . Smoking status: Former Smoker -- 1.00 packs/day for 23 years    Types: Cigarettes    Quit date: 04/26/1996  . Smokeless tobacco: Never Used  . Alcohol Use: No    Review of Systems  Constitutional: Negative for fever.  Gastrointestinal: Negative for vomiting and abdominal pain.  Genitourinary: Positive for penile swelling (mildly swelling since surgery). Negative for dysuria and hematuria.  All other systems reviewed and are negative.     Allergies  Gadolinium derivatives  Home Medications   Prior to Admission medications   Medication Sig Start Date End Date Taking? Authorizing Provider  amoxicillin-clavulanate (AUGMENTIN) 875-125 MG tablet Take 1 tablet by mouth every 12 (twelve) hours. 07/30/15   Christell Faith, MD  aspirin 81 MG tablet Take 81 mg by mouth every morning.     Historical Provider, MD  docusate sodium (COLACE) 100 MG capsule Take 1 capsule (100 mg total) by mouth 2 (two) times daily. 07/30/15   Christell Faith, MD  fenofibrate 54 MG tablet Take 54 mg by mouth every morning. Reported on 05/14/2015    Historical Provider, MD  fish oil-omega-3 fatty acids 1000 MG capsule Take 3 g by mouth daily.     Historical Provider, MD  Anastasio Auerbach  100 MG TABS tablet Take 100 mg by mouth daily.  04/12/15   Historical Provider, MD  metFORMIN (GLUCOPHAGE-XR) 500 MG 24 hr tablet Take 500 mg by mouth daily.  04/09/15   Historical Provider, MD  oxyCODONE (OXY IR/ROXICODONE) 5 MG immediate release tablet Take 1-2 tablets (5-10 mg total) by mouth every 6 (six) hours as needed for moderate pain. 07/30/15   Christell Faith, MD  senna (SENOKOT) 8.6 MG TABS tablet Take 1 tablet (8.6 mg total) by mouth 2 (two) times daily. 07/30/15   Christell Faith, MD  simvastatin (ZOCOR) 20 MG tablet Take 20 mg by mouth daily. 05/16/15   Historical Provider, MD   valsartan-hydrochlorothiazide (DIOVAN-HCT) 320-12.5 MG tablet Take 1 tablet by mouth daily.  04/19/15   Historical Provider, MD  Vitamin D, Ergocalciferol, (DRISDOL) 50000 UNITS CAPS capsule Take 50,000 Units by mouth every 7 (seven) days.  04/19/15   Historical Provider, MD   BP 149/96 mmHg  Pulse 101  Temp(Src) 98.2 F (36.8 C) (Oral)  Resp 18  SpO2 98% Physical Exam  Constitutional: He is oriented to person, place, and time. He appears well-developed and well-nourished. No distress.  HENT:  Head: Normocephalic and atraumatic.  Eyes: Conjunctivae are normal.  Neck: Neck supple. No tracheal deviation present.  Cardiovascular: Normal rate and regular rhythm.   Pulmonary/Chest: Effort normal. No respiratory distress.  Abdominal: Soft. He exhibits no distension.  Genitourinary:  Penis s/p palpable implantation. Minimal scrotal edema, no blood at meatus. Suprapubic surgical incision c/d/i.  Neurological: He is alert and oriented to person, place, and time.  Skin: Skin is warm and dry.  Psychiatric: He has a normal mood and affect.  Vitals reviewed.   ED Course  Procedures (including critical care time) Labs Review Labs Reviewed  URINALYSIS, ROUTINE W REFLEX MICROSCOPIC (NOT AT [facility]) - Abnormal; Notable for the following:    Glucose, UA >1000 (*)    Hgb urine dipstick MODERATE (*)    All other components within normal limits  URINE MICROSCOPIC-ADD ON - Abnormal; Notable for the following:    Squamous Epithelial / LPF 0-5 (*)    Bacteria, UA RARE (*)    All other components within normal limits  URINE CULTURE    Imaging Review No results found. I have personally reviewed and evaluated these images and lab results as part of my medical decision-making.   EKG Interpretation None      MDM   Final diagnoses:  Acute urinary retention   60 y.o. male presents with Acute urinary retention after having a bowel movement yesterday. He has not made any urine since that time.  He has been placing ice on his groin for a recent surgery placing a penile implant. The Foley was removed after procedure without any difficulty and patient was spontaneously voiding at that time. Foley catheter placed here without difficulty with good relief of symptoms. Acute retention is likely related to local inflammation following surgical manipulation and cystoscopy. Will start on Flomax and the Foley in place until follow-up on Tuesday. Patient recommended to get in touch with his urology team regarding this issue. Urine does not appear infected, sent for culture so this can be followed up on an outpatient basis. Plan to follow up with PCP as needed and return precautions discussed for worsening or new concerning symptoms.    Leo Grosser, MD 08/02/15 [phone]

## 2015-08-04 LAB — URINE CULTURE: Culture: NO GROWTH

## 2015-10-23 DIAGNOSIS — Z0000 Encounter for general adult medical examination without abnormal findings: Secondary | ICD-10-CM | POA: Insufficient documentation

## 2015-10-30 DIAGNOSIS — D751 Secondary polycythemia: Secondary | ICD-10-CM | POA: Insufficient documentation

## 2015-11-04 ENCOUNTER — Other Ambulatory Visit: Payer: Self-pay | Admitting: Internal Medicine

## 2015-11-04 DIAGNOSIS — Z136 Encounter for screening for cardiovascular disorders: Secondary | ICD-10-CM

## 2015-11-17 ENCOUNTER — Ambulatory Visit
Admission: RE | Admit: 2015-11-17 | Discharge: 2015-11-17 | Disposition: A | Discharge status: Home / Self Care | Source: Ambulatory Visit | Attending: Internal Medicine | Admitting: Internal Medicine

## 2015-11-17 DIAGNOSIS — Z136 Encounter for screening for cardiovascular disorders: Secondary | ICD-10-CM

## 2016-04-27 ENCOUNTER — Other Ambulatory Visit (HOSPITAL_COMMUNITY): Payer: Self-pay | Admitting: Internal Medicine

## 2016-04-27 DIAGNOSIS — H3560 Retinal hemorrhage, unspecified eye: Secondary | ICD-10-CM

## 2016-04-28 ENCOUNTER — Ambulatory Visit (HOSPITAL_COMMUNITY)
Admission: RE | Admit: 2016-04-28 | Discharge: 2016-04-28 | Disposition: A | Discharge status: Home / Self Care | Source: Ambulatory Visit | Attending: Vascular Surgery | Admitting: Vascular Surgery

## 2016-04-28 DIAGNOSIS — H3560 Retinal hemorrhage, unspecified eye: Secondary | ICD-10-CM | POA: Diagnosis not present

## 2016-04-28 DIAGNOSIS — I6523 Occlusion and stenosis of bilateral carotid arteries: Secondary | ICD-10-CM | POA: Diagnosis not present

## 2016-04-28 LAB — VAS US CAROTID
LCCADDIAS: -21 cm/s
LCCAPSYS: 129 cm/s
LEFT ECA DIAS: -11 cm/s
Left CCA dist sys: -106 cm/s
Left CCA prox dias: 22 cm/s
Left ICA dist dias: -23 cm/s
Left ICA dist sys: -61 cm/s
Left ICA prox dias: 13 cm/s
Left ICA prox sys: 62 cm/s
RCCADSYS: -48 cm/s
RCCAPSYS: 149 cm/s
RIGHT CCA MID DIAS: 31 cm/s
RIGHT ECA DIAS: -23 cm/s
Right CCA prox dias: 25 cm/s

## 2016-05-13 ENCOUNTER — Ambulatory Visit

## 2016-05-17 ENCOUNTER — Ambulatory Visit: Admitting: Oncology

## 2016-11-01 DIAGNOSIS — F4321 Adjustment disorder with depressed mood: Secondary | ICD-10-CM | POA: Insufficient documentation

## 2017-07-05 DIAGNOSIS — M25511 Pain in right shoulder: Secondary | ICD-10-CM | POA: Insufficient documentation

## 2017-11-11 DIAGNOSIS — Z1389 Encounter for screening for other disorder: Secondary | ICD-10-CM | POA: Insufficient documentation

## 2018-11-21 DIAGNOSIS — E786 Lipoprotein deficiency: Secondary | ICD-10-CM | POA: Insufficient documentation

## 2019-03-23 DIAGNOSIS — Z85841 Personal history of malignant neoplasm of brain: Secondary | ICD-10-CM | POA: Insufficient documentation

## 2019-03-23 DIAGNOSIS — D496 Neoplasm of unspecified behavior of brain: Secondary | ICD-10-CM | POA: Insufficient documentation

## 2019-04-01 ENCOUNTER — Encounter (HOSPITAL_COMMUNITY): Payer: Self-pay

## 2019-04-01 ENCOUNTER — Emergency Department (HOSPITAL_COMMUNITY)
Admission: EM | Admit: 2019-04-01 | Discharge: 2019-04-02 | Disposition: A | Discharge status: Home / Self Care | Attending: Emergency Medicine | Admitting: Emergency Medicine

## 2019-04-01 ENCOUNTER — Other Ambulatory Visit: Payer: Self-pay

## 2019-04-01 DIAGNOSIS — R42 Dizziness and giddiness: Secondary | ICD-10-CM | POA: Diagnosis present

## 2019-04-01 DIAGNOSIS — Z7982 Long term (current) use of aspirin: Secondary | ICD-10-CM | POA: Diagnosis not present

## 2019-04-01 DIAGNOSIS — D751 Secondary polycythemia: Secondary | ICD-10-CM

## 2019-04-01 DIAGNOSIS — Z79899 Other long term (current) drug therapy: Secondary | ICD-10-CM | POA: Insufficient documentation

## 2019-04-01 DIAGNOSIS — I10 Essential (primary) hypertension: Secondary | ICD-10-CM | POA: Diagnosis not present

## 2019-04-01 DIAGNOSIS — Z85841 Personal history of malignant neoplasm of brain: Secondary | ICD-10-CM | POA: Diagnosis not present

## 2019-04-01 DIAGNOSIS — H81399 Other peripheral vertigo, unspecified ear: Secondary | ICD-10-CM

## 2019-04-01 DIAGNOSIS — E119 Type 2 diabetes mellitus without complications: Secondary | ICD-10-CM | POA: Insufficient documentation

## 2019-04-01 DIAGNOSIS — Z87891 Personal history of nicotine dependence: Secondary | ICD-10-CM | POA: Insufficient documentation

## 2019-04-01 DIAGNOSIS — N289 Disorder of kidney and ureter, unspecified: Secondary | ICD-10-CM

## 2019-04-01 DIAGNOSIS — Z7984 Long term (current) use of oral hypoglycemic drugs: Secondary | ICD-10-CM | POA: Insufficient documentation

## 2019-04-01 LAB — BASIC METABOLIC PANEL
Anion gap: 10 (ref 5–15)
BUN: 20 mg/dL (ref 8–23)
CO2: 26 mmol/L (ref 22–32)
Calcium: 9.5 mg/dL (ref 8.9–10.3)
Chloride: 102 mmol/L (ref 98–111)
Creatinine, Ser: 1.42 mg/dL — ABNORMAL HIGH (ref 0.61–1.24)
GFR calc Af Amer: >60 mL/min (ref >60)
GFR calc non Af Amer: 53 mL/min — ABNORMAL LOW (ref >60)
Glucose, Bld: 127 mg/dL — ABNORMAL HIGH (ref 70–99)
Potassium: 3.8 mmol/L (ref 3.5–5.1)
Sodium: 138 mmol/L (ref 135–145)

## 2019-04-01 LAB — CBG MONITORING, ED: Glucose-Capillary: 128 mg/dL — ABNORMAL HIGH (ref 70–99)

## 2019-04-01 LAB — CBC
HCT: 54.6 % — ABNORMAL HIGH (ref 39.0–52.0)
Hemoglobin: 18.6 g/dL — ABNORMAL HIGH (ref 13.0–17.0)
MCH: 27 pg (ref 26.0–34.0)
MCHC: 34.1 g/dL (ref 30.0–36.0)
MCV: 79.2 fL — ABNORMAL LOW (ref 80.0–100.0)
Platelets: 217 10*3/uL (ref 150–400)
RBC: 6.89 MIL/uL — ABNORMAL HIGH (ref 4.22–5.81)
RDW: 17 % — ABNORMAL HIGH (ref 11.5–15.5)
WBC: 6.4 10*3/uL (ref 4.0–10.5)
nRBC: 0 % (ref 0.0–0.2)

## 2019-04-01 MED ORDER — SODIUM CHLORIDE 0.9% FLUSH: 3.0000 mL | Freq: Once | INTRAVENOUS | Status: DC

## 2019-04-01 NOTE — ED Triage Notes (Signed)
Pt states that for the past 45 minutes he has been having dizziness, felt like he was going to have a syncopal episode and some nausea.

## 2019-04-02 MED ORDER — MECLIZINE HCL 25 MG PO TABS: 25.0000 mg | ORAL_TABLET | Freq: Three times a day (TID) | ORAL | 0 refills | Status: DC | PRN

## 2019-04-02 NOTE — Discharge Instructions (Addendum)
Your blood work showed an elevation of creatinine (a blood test of your kidneys to 1.42, and an elevation of your hemoglobin (the part of your blood that carries oxygen) to 18.6. Please discuss these results with your primary care provider. You may need additional testing, or referral to a specialist.

## 2019-04-02 NOTE — ED Provider Notes (Signed)
[facility] EMERGENCY DEPARTMENT Provider Note   CSN: [id number] Arrival date & time: 04/01/19  2214 He has history of hypertension, diabetes, hyperlipidemia and comes in following an episode of dizziness at home.   History   Chief Complaint Chief Complaint  Patient presents with  . Dizziness    HPI Randall Lewis is a 63 y.o. male.   The history is provided by the patient.  Dizziness He has history of hypertension, diabetes, hyperlipidemia and comes in following an episode of dizziness at home.  He was watching television at about 9:30 PM when he noted dizziness and that the television seemed to be moving.  Dizziness was worse with head movement, better if he stayed still.  There was associated nausea.  When he tried to walk, he noticed he was off balance.  Symptoms resolved at about 1:30 AM.  He denies headache and denies ear pain or tinnitus or hearing loss.  He has never had similar symptoms before.  He does have history of a brain tumor which was treated with radiation and has been cured.  Past Medical History:  Diagnosis Date  . BPH (benign prostatic hyperplasia)   . Cancer [facility]) 2006   Brain tumor, in remission after chemo radiation and surgery  . Diabetes mellitus without complication ([place])   . ED (erectile dysfunction)   . Heart murmur    History of heart murmur  . Hyperlipidemia   . Hypertension   . Sleep apnea    USES C-PAP    Patient Active Problem List   Diagnosis Date Noted  . Erectile dysfunction 07/29/2015  . 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 BRAIN SURGERY  2006   BRAIN TUMOR  - CANCER  . CYSTOSCOPY N/A 07/29/2015   Procedure: FLEXIBLE CYSTOSCOPY;  Surgeon: Irine Seal, MD;  Location: [facility] ORS;  Service: Urology;  Laterality: N/A;  . FOOT BONE EXCISION  1989  . PENILE PROSTHESIS  IMPLANT N/A 07/29/2015   Procedure: PENILE PROSTHESIS;  Surgeon: Irine Seal, MD;  Location: [facility];  Service: Urology;  Laterality: N/A;  . right knee meniscus repair  2010  . THYROID LOBECTOMY  05/12/2012   Procedure: THYROID LOBECTOMY;  Surgeon: Earnstine Regal, MD;  Location: [facility];  Service: General;  Laterality: Left;  left thyroid lobectomy        Home Medications    Prior to Admission medications   Medication Sig Start Date End Date Taking? Authorizing Provider  amoxicillin-clavulanate (AUGMENTIN) 875-125 MG tablet Take 1 tablet by mouth every 12 (twelve) hours. Patient taking differently: Take 1 tablet by mouth every 12 (twelve) hours. Started 03/01 for for 9 days 07/30/15   Christell Faith, MD  aspirin 81 MG tablet Take 81 mg by mouth every morning.     [provider]  docusate sodium (COLACE) 100 MG capsule Take 1 capsule (100 mg total) by mouth 2 (two) times daily. 07/30/15   Christell Faith, MD  fish oil-omega-3 fatty acids 1000 MG capsule Take 2 g by mouth daily.     [provider]  INVOKANA 100 MG TABS tablet Take 100 mg by mouth daily.  04/12/15   [provider]  metFORMIN (GLUCOPHAGE-XR) 500 MG 24 hr tablet Take 500 mg by mouth daily.  04/09/15   [provider]  oxyCODONE (OXY IR/ROXICODONE) 5 MG immediate  release tablet Take 1-2 tablets (5-10 mg total) by mouth every 6 (six) hours as needed for moderate pain. 07/30/15   Christell Faith, MD  senna (SENOKOT) 8.6 MG TABS tablet Take 1 tablet (8.6 mg total) by mouth 2 (two) times daily. 07/30/15   Christell Faith, MD  simvastatin (ZOCOR) 20 MG tablet Take 20 mg by mouth daily. 05/16/15   [provider]  tamsulosin (FLOMAX) 0.4 MG CAPS capsule Take 1 capsule (0.4 mg total) by mouth daily. 08/02/15   Leo Grosser, MD  valsartan-hydrochlorothiazide (DIOVAN-HCT) 320-12.5 MG tablet Take 1 tablet by mouth daily.  04/19/15   [provider]  Vitamin D, Ergocalciferol, (DRISDOL) 50000  UNITS CAPS capsule Take 50,000 Units by mouth every Friday.  04/19/15   [provider]    Family History Family History  Problem Relation Age of Onset  . Cancer Mother        Breast Cancer  . Cancer Brother 45  . Birth defects Son     Social History Social History   Tobacco Use  . Smoking status: Former Smoker    Packs/day: 1.00    Years: 23.00    Pack years: 23.00    Types: Cigarettes    Quit date: 04/26/1996    Years since quitting: 22.9  . Smokeless tobacco: Never Used  Substance Use Topics  . Alcohol use: No  . Drug use: No     Allergies   Gadolinium derivatives   Review of Systems Review of Systems  Neurological: Positive for dizziness.  All other systems reviewed and are negative.    Physical Exam Updated Vital Signs BP 127/77 (BP Location: Left Arm)   Pulse (!) 57   Temp 97.6 F (36.4 C) (Oral)   Resp 18   SpO2 100%   Physical Exam Vitals signs and nursing note reviewed.    63 year old male, resting comfortably and in no acute distress. Vital signs are normal. Oxygen saturation is 100%, which is normal. Head is normocephalic and atraumatic. PERRLA, EOMI. Oropharynx is clear. Neck is nontender and supple without adenopathy or JVD.  There are no carotid bruits. Back is nontender and there is no CVA tenderness. Lungs are clear without rales, wheezes, or rhonchi. Chest is nontender. Heart has regular rate and rhythm without murmur. Abdomen is soft, flat, nontender without masses or hepatosplenomegaly and peristalsis is normoactive. Extremities have no cyanosis or edema, full range of motion is present. Skin is warm and dry without rash. Neurologic: Mental status is normal, cranial nerves are intact, there are no motor or sensory deficits.  There is no nystagmus.  Dizziness is not reproduced by passive head movement.  ED Treatments / Results  Labs (all labs ordered are listed, but only abnormal results are displayed) Labs Reviewed   BASIC METABOLIC PANEL - Abnormal; Notable for the following components:      Result Value   Glucose, Bld 127 (*)    Creatinine, Ser 1.42 (*)    GFR calc non Af Amer 53 (*)    All other components within normal limits  CBC - Abnormal; Notable for the following components:   RBC 6.89 (*)    Hemoglobin 18.6 (*)    HCT 54.6 (*)    MCV 79.2 (*)    RDW 17.0 (*)    All other components within normal limits  CBG MONITORING, ED - Abnormal; Notable for the following components:   Glucose-Capillary 128 (*)    All other components within normal  limits  URINALYSIS, ROUTINE W REFLEX MICROSCOPIC  CBG MONITORING, ED    EKG EKG Interpretation  Date/Time:  Sunday April 01 2019 22:29:40 EST Ventricular Rate:  53 PR Interval:  194 QRS Duration: 88 QT Interval:  436 QTC Calculation: 409 R Axis:   5 Text Interpretation: Sinus bradycardia Nonspecific ST and T wave abnormality Abnormal ECG When compared with ECG of 07/24/2015, No significant change was found Confirmed by Xanthe Couillard ([id number]) on 04/02/2019 1:33:22 AM  Procedures Procedures   Medications Ordered in ED Medications  sodium chloride flush (NS) 0.9 % injection 3 mL (has no administration in time range)     Initial Impression / Assessment and Plan / ED Course  I have reviewed the triage vital signs and the nursing notes.  Pertinent lab results that were available during my care of the patient were reviewed by me and considered in my medical decision making (see chart for details).  Dizziness which appears to be peripheral vertigo based on history.  Symptoms have resolved, and no abnormal findings on neurologic exam in the ED.  ECG is unchanged from baseline.  Labs do show an increase in creatinine over baseline, and an increase in hemoglobin.  Last creatinine was in March 2017 and had been normal, creatinine today is elevated to 1.42.  Hemoglobin is elevated to 18.6, and prior hemoglobins have been elevated to the range of 17.3 with  last one on record from March 2017.  Polycythemia would put him at increased risk for stroke.  He will need to follow-up with his PCP regarding his polycythemia and renal insufficiency.  However, since symptoms have resolved, no indication for imaging today.  He is discharged with prescription for meclizine.  Final Clinical Impressions(s) / ED Diagnoses   Final diagnoses:  Peripheral vertigo, unspecified laterality  Polycythemia  Renal insufficiency    ED Discharge Orders         Ordered    meclizine (ANTIVERT) 25 MG tablet  3 times daily PRN     11 /02/20 [id number]           Delora Fuel, MD [id number]

## 2019-04-06 ENCOUNTER — Ambulatory Visit (HOSPITAL_COMMUNITY): Admission: RE | Admit: 2019-04-06 | Source: Ambulatory Visit

## 2019-04-06 ENCOUNTER — Other Ambulatory Visit (HOSPITAL_COMMUNITY): Payer: Self-pay | Admitting: Internal Medicine

## 2019-04-06 ENCOUNTER — Other Ambulatory Visit: Payer: Self-pay | Admitting: Internal Medicine

## 2019-04-06 DIAGNOSIS — R42 Dizziness and giddiness: Secondary | ICD-10-CM

## 2019-04-10 ENCOUNTER — Ambulatory Visit (HOSPITAL_COMMUNITY)
Admission: RE | Admit: 2019-04-10 | Discharge: 2019-04-10 | Disposition: A | Discharge status: Home / Self Care | Source: Ambulatory Visit | Attending: Internal Medicine | Admitting: Internal Medicine

## 2019-04-10 ENCOUNTER — Other Ambulatory Visit: Payer: Self-pay

## 2019-04-10 DIAGNOSIS — R42 Dizziness and giddiness: Secondary | ICD-10-CM | POA: Insufficient documentation

## 2019-04-10 MED ORDER — GADOBUTROL 1 MMOL/ML IV SOLN
10.0000 mL | Freq: Once | INTRAVENOUS | Status: AC | PRN
  Administered 2019-04-10: 10 mL via INTRAVENOUS

## 2019-04-12 ENCOUNTER — Encounter: Payer: Self-pay | Admitting: Hematology

## 2019-04-12 ENCOUNTER — Telehealth: Payer: Self-pay | Admitting: Hematology

## 2019-04-12 NOTE — Telephone Encounter (Signed)
Received a new hem referral from Dr. Philip Aspen at [facility] for polycythemia. [name] is a former pt of Dr. Grayland Ormond who wanted to be seen in [facility] for care. He has been scheduled to see Dr. Irene Limbo on 11/30 ay 10am. Pt has been made aware to arrive 15 minutes early. Letter mailed.

## 2019-04-12 NOTE — Telephone Encounter (Signed)
error 

## 2019-04-30 ENCOUNTER — Other Ambulatory Visit: Payer: Self-pay

## 2019-04-30 ENCOUNTER — Inpatient Hospital Stay

## 2019-04-30 ENCOUNTER — Telehealth: Payer: Self-pay | Admitting: Hematology

## 2019-04-30 ENCOUNTER — Inpatient Hospital Stay: Attending: Hematology | Admitting: Hematology

## 2019-04-30 VITALS — BP 109/75 | HR 70 | Temp 98.0°F | Resp 18 | Ht 71.0 in | Wt 243.5 lb

## 2019-04-30 DIAGNOSIS — R011 Cardiac murmur, unspecified: Secondary | ICD-10-CM | POA: Diagnosis not present

## 2019-04-30 DIAGNOSIS — E119 Type 2 diabetes mellitus without complications: Secondary | ICD-10-CM | POA: Diagnosis not present

## 2019-04-30 DIAGNOSIS — Z7982 Long term (current) use of aspirin: Secondary | ICD-10-CM | POA: Insufficient documentation

## 2019-04-30 DIAGNOSIS — Z803 Family history of malignant neoplasm of breast: Secondary | ICD-10-CM | POA: Diagnosis not present

## 2019-04-30 DIAGNOSIS — D751 Secondary polycythemia: Secondary | ICD-10-CM | POA: Diagnosis present

## 2019-04-30 DIAGNOSIS — Z87891 Personal history of nicotine dependence: Secondary | ICD-10-CM | POA: Insufficient documentation

## 2019-04-30 DIAGNOSIS — Z79899 Other long term (current) drug therapy: Secondary | ICD-10-CM | POA: Diagnosis not present

## 2019-04-30 DIAGNOSIS — N400 Enlarged prostate without lower urinary tract symptoms: Secondary | ICD-10-CM | POA: Diagnosis not present

## 2019-04-30 DIAGNOSIS — E785 Hyperlipidemia, unspecified: Secondary | ICD-10-CM | POA: Insufficient documentation

## 2019-04-30 DIAGNOSIS — G4730 Sleep apnea, unspecified: Secondary | ICD-10-CM | POA: Diagnosis not present

## 2019-04-30 LAB — CMP (CANCER CENTER ONLY)
ALT: 38 U/L (ref 0–44)
AST: 24 U/L (ref 15–41)
Albumin: 4.3 g/dL (ref 3.5–5.0)
Alkaline Phosphatase: 62 U/L (ref 38–126)
Anion gap: 12 (ref 5–15)
BUN: 19 mg/dL (ref 8–23)
CO2: 30 mmol/L (ref 22–32)
Calcium: 10.3 mg/dL (ref 8.9–10.3)
Chloride: 100 mmol/L (ref 98–111)
Creatinine: 1.28 mg/dL — ABNORMAL HIGH (ref 0.61–1.24)
GFR, Est AFR Am: >60 mL/min (ref >60)
GFR, Estimated: 60 mL/min — ABNORMAL LOW (ref >60)
Glucose, Bld: 108 mg/dL — ABNORMAL HIGH (ref 70–99)
Potassium: 4.5 mmol/L (ref 3.5–5.1)
Sodium: 142 mmol/L (ref 135–145)
Total Bilirubin: 0.8 mg/dL (ref 0.3–1.2)
Total Protein: 7.5 g/dL (ref 6.5–8.1)

## 2019-04-30 LAB — CBC WITH DIFFERENTIAL/PLATELET
Abs Immature Granulocytes: 0.01 10*3/uL (ref 0.00–0.07)
Basophils Absolute: 0 10*3/uL (ref 0.0–0.1)
Basophils Relative: 0 %
Eosinophils Absolute: 0 10*3/uL (ref 0.0–0.5)
Eosinophils Relative: 1 %
HCT: 56.6 % — ABNORMAL HIGH (ref 39.0–52.0)
Hemoglobin: 18.4 g/dL — ABNORMAL HIGH (ref 13.0–17.0)
Immature Granulocytes: 0 %
Lymphocytes Relative: 34 %
Lymphs Abs: 2.2 10*3/uL (ref 0.7–4.0)
MCH: 25.8 pg — ABNORMAL LOW (ref 26.0–34.0)
MCHC: 32.5 g/dL (ref 30.0–36.0)
MCV: 79.3 fL — ABNORMAL LOW (ref 80.0–100.0)
Monocytes Absolute: 0.5 10*3/uL (ref 0.1–1.0)
Monocytes Relative: 7 %
Neutro Abs: 3.7 10*3/uL (ref 1.7–7.7)
Neutrophils Relative %: 58 %
Platelets: 231 10*3/uL (ref 150–400)
RBC: 7.14 MIL/uL — ABNORMAL HIGH (ref 4.22–5.81)
RDW: 17.1 % — ABNORMAL HIGH (ref 11.5–15.5)
WBC: 6.5 10*3/uL (ref 4.0–10.5)
nRBC: 0 % (ref 0.0–0.2)

## 2019-04-30 NOTE — Patient Instructions (Signed)
Thank you for choosing [facility] to provide your oncology and hematology care.   Should you have questions after your visit to the [facility] ([facility]), please contact this office at [phone] between 8:30 AM and 4:30 PM.  Voice mails left after 4:00 PM may not be returned until the following business day.  Calls received after 4:30 PM will be answered by an off-site Nurse Triage Line.    Prescription Refills:  Please have your pharmacy contact us directly for most prescription requests.  Contact the office directly for refills of narcotics (pain medications). Allow 48-72 hours for refills.  Appointments: Please contact the [facility] scheduling department [phone] for questions regarding [facility] appointment scheduling.  Contact the schedulers with any scheduling changes so that your appointment can be rescheduled in a timely manner.   Central Scheduling for [facility] [phone] - Call to schedule procedures such as PET scans, CT scans, MRI, Ultrasound, etc.  To afford each patient quality time with our providers, please arrive 30 minutes before your scheduled appointment time.  If you arrive late for your appointment, you may be asked to reschedule.  We strive to give you quality time with our providers, and arriving late affects you and other patients whose appointments are after yours. If you are a no show for multiple scheduled visits, you may be dismissed from the clinic at the providers discretion.     Resources: [facility] Social Workers [phone] for additional information on assistance programs --Anne Cunningham/Abigail Elmore  [place] DSS  [phone]: Information regarding food stamps, Medicaid, and utility assistance [employer] [phone]   [place] [employer]'s shared-ride transportation service for eligible riders who have a disability that prevents them from riding the fixed route bus.   Medicare Rights Center 800-333-4114 Helps people with  Medicare understand their rights and benefits, navigate the Medicare system, and secure the quality healthcare they deserve [employer] 800-227-2345 Assists patients locate various types of support and financial assistance Cancer Care: 1-800-813-HOPE (4673) Provides financial assistance, online support groups, medication/co-pay assistance.   Transportation Assistance for appointments at [facility]: Transportation Coordinator [phone]  Again, thank you for choosing  Cancer Center for your care.       

## 2019-04-30 NOTE — Telephone Encounter (Signed)
Scheduled appt per 11/30 los.  Spoke with patient and he is aware of his appt date and time,

## 2019-04-30 NOTE — Progress Notes (Signed)
HEMATOLOGY/ONCOLOGY CONSULTATION NOTE  Date of Service: 04/30/2019  Patient Care Team: Leanna Battles, MD as PCP - General (Internal Medicine)  CHIEF COMPLAINTS/PURPOSE OF CONSULTATION:  Polycythemia  HISTORY OF PRESENTING ILLNESS:   Randall Lewis is a wonderful 63 y.o. 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 The pt reports that he is doing well overall.   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 This continued to be closely monitored until 2016. Pt took testosterone injections during his treatment, around 2015-2016, and he was asked to discontinue. Dr. Sharlett Iles is currently in charge of follow-up. Recently, he was set to have repeat scans. Due to his dizziness they also did lab work, which is where is polycythemia was picked up. Pt denies any issues with seizures or taking any seizure medication. He is no longer experiencing vertigo and the cause was never deduced. He took Meclizine one time but has not needed it since.   Pt was diagnosed with sleep apnea around 2003-2005 and continues to use his CPAP every night. Pt does not use it when he naps, which he does occasionally for 15-20 minutes throughout the day. His last sleep study was conducted within the last year and his sleep settings have been regularly updated.   Pt quit smoking in 1997 and had previously smoked for 23 years. He has not been diagnosed with COPD or Emphysema and does not need to use any inhalers. He denies having any heart issues but his brother did pass away from a heart attack at age 59. Pt's diabetes has been well-controlled and he has been taking Invokana for about 4-5 years. Pt has continued to take his water pills. Pt drinks a few caffeinated sodas per day or 1-2 cups of coffee and about 5 cups of water per day.   He has not felt any differently in the last 6-12  months. Pt denies any chest pain, shortness of breath, or discoloration of limbs. He does experience some itching, that has not worsened recently. He denies any sudden vision changes. Pt's weight has been fairly steady.   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 MGMT IMMUNOHISTOCHEMISTRY:NEGATIVE (1% OF TUMOR CELLS EXHIBIT STAINING)."  Most recent lab results (04/06/2019) of CBC is as follows: WBC at 4.51K, Lymphs Abs at 1.5K, Eos Abs at 0.0K, Baso Abs at 0.0K, Lymphs Rel at 33.8, Eos Rel at 0.5, Baso Rel at 0.7, RBC at 7.0, Hgb at 18.0, HCT at 57.9, MCV at 82.6, MCH at 25.7, MCHC at 31.1, RDW at 13.6, PLT at 198K, MPV at 7.3, Neutro Abs at 2.6K, Neutro Rel at 58.2, Mono Abs at 0.3K, Mono Rel at 6.8, Glucose at 190,BUN at 17, Creatinine at 1.0, GFR Est AFR Am at 91.6, Sodium at 139, Potassium at 4.0, Chloride at 104, CO2 at 20, Calcium at 9.0.  On review of systems, pt reports vertigo, itching, and denies chest pain, SOB, discoloration of limbs, sudden vision changes, mouth sores, unexpected weight loss, abdominal pain and any other symptoms.   On PMHx the pt reports Sleep Apnea, Diabetes, HTN, Anaplastic Oligodendroglioma (2006). On Social Hx the pt reports he is a previous smoker of 23 years (quit in 1997)  On Family Hx the pt reports a brother who passed from a heart attack  at 45  MEDICAL HISTORY:  Past Medical History:  Diagnosis Date  . BPH (benign prostatic hyperplasia)   . Cancer [facility]) 2006   Brain tumor, in remission after chemo radiation and surgery  . Diabetes mellitus without complication ([place])   . ED (erectile dysfunction)   . Heart murmur    History of heart murmur  . Hyperlipidemia   . Hypertension   . Sleep apnea    USES C-PAP    SURGICAL HISTORY: Past Surgical History:  Procedure Laterality Date  . BRAIN SURGERY  2006    BRAIN TUMOR  - CANCER  . CYSTOSCOPY N/A 07/29/2015   Procedure: FLEXIBLE CYSTOSCOPY;  Surgeon: Irine Seal, MD;  Location: [facility];  Service: Urology;  Laterality: N/A;  . FOOT BONE EXCISION  1989  . PENILE PROSTHESIS IMPLANT N/A 07/29/2015   Procedure: PENILE PROSTHESIS;  Surgeon: Irine Seal, MD;  Location: [facility];  Service: Urology;  Laterality: N/A;  . right knee meniscus repair  2010  . THYROID LOBECTOMY  05/12/2012   Procedure: THYROID LOBECTOMY;  Surgeon: Earnstine Regal, MD;  Location: [facility];  Service: General;  Laterality: Left;  left thyroid lobectomy    SOCIAL HISTORY: Social History   Socioeconomic History  . Marital status: Married    Spouse name: Not on file  . Number of children: Not on file  . Years of education: Not on file  . Highest education level: Not on file  Occupational History  . Not on file  Social Needs  . Financial resource strain: Not on file  . Food insecurity    Worry: Not on file    Inability: Not on file  . Transportation needs    Medical: Not on file    Non-medical: Not on file  Tobacco Use  . Smoking status: Former Smoker    Packs/day: 1.00    Years: 23.00    Pack years: 23.00    Types: Cigarettes    Quit date: 04/26/1996    Years since quitting: 23.0  . Smokeless tobacco: Never Used  Substance and Sexual Activity  . Alcohol use: No  . Drug use: No  . Sexual activity: Not on file  Lifestyle  . Physical activity    Days per week: Not on file    Minutes per session: Not on file  . Stress: Not on file  Relationships  . Social Herbalist on phone: Not on file    Gets together: Not on file    Attends religious service: Not on file    Active member of club or organization: Not on file    Attends meetings of clubs or organizations: Not on file    Relationship status: Not on file  . Intimate partner violence    Fear of current or ex partner: Not on file    Emotionally abused: Not on file    Physically abused: Not on file     Forced sexual activity: Not on file  Other Topics Concern  . Not on file  Social History Narrative  . Not on file    FAMILY HISTORY: Family History  Problem Relation Age of Onset  . Cancer Mother        Breast Cancer  . Cancer Brother 44  . Birth defects Son     ALLERGIES:  is allergic to gadolinium derivatives.  MEDICATIONS:  Current Outpatient Medications  Medication Sig Dispense Refill  . aspirin 81 MG tablet Take 81 mg by mouth every  morning.     . docusate sodium (COLACE) 100 MG capsule Take 1 capsule (100 mg total) by mouth 2 (two) times daily. 10 capsule 0  . fish oil-omega-3 fatty acids 1000 MG capsule Take 2 g by mouth daily.     . INVOKANA 100 MG TABS tablet Take 100 mg by mouth daily.     . meclizine (ANTIVERT) 25 MG tablet Take 1 tablet (25 mg total) by mouth 3 (three) times daily as needed for dizziness. 30 tablet 0  . metFORMIN (GLUCOPHAGE-XR) 500 MG 24 hr tablet Take 500 mg by mouth daily.     Marland Kitchen oxyCODONE (OXY IR/ROXICODONE) 5 MG immediate release tablet Take 1-2 tablets (5-10 mg total) by mouth every 6 (six) hours as needed for moderate pain. 20 tablet 0  . senna (SENOKOT) 8.6 MG TABS tablet Take 1 tablet (8.6 mg total) by mouth 2 (two) times daily. 120 each 0  . simvastatin (ZOCOR) 20 MG tablet Take 20 mg by mouth daily.    . tamsulosin (FLOMAX) 0.4 MG CAPS capsule Take 1 capsule (0.4 mg total) by mouth daily. 14 capsule 0  . valsartan-hydrochlorothiazide (DIOVAN-HCT) 320-12.5 MG tablet Take 1 tablet by mouth daily.     . Vitamin D, Ergocalciferol, (DRISDOL) 50000 UNITS CAPS capsule Take 50,000 Units by mouth every Friday.      No current facility-administered medications for this visit.     REVIEW OF SYSTEMS:    10 Point review of Systems was done is negative except as noted above.  PHYSICAL EXAMINATION: ECOG PERFORMANCE STATUS: 0 - Asymptomatic  . Vitals:   04/30/19 1015  BP: 109/75  Pulse: 70  Resp: 18  Temp: 98 F (36.7 C)  SpO2: 98%   Filed  Weights   04/30/19 1015  Weight: 243 lb 8 oz (110.5 kg)   .Body mass index is 33.96 kg/m.  GENERAL:alert, in no acute distress and comfortable SKIN: no acute rashes, no significant lesions EYES: conjunctiva are pink and non-injected, sclera anicteric OROPHARYNX: MMM, no exudates, no oropharyngeal erythema or ulceration NECK: supple, no JVD LYMPH:  no palpable lymphadenopathy in the cervical, axillary or inguinal regions LUNGS: clear to auscultation b/l with normal respiratory effort HEART: regular rate & rhythm ABDOMEN:  normoactive bowel sounds , non tender, not distended. Extremity: no pedal edema PSYCH: alert & oriented x 3 with fluent speech NEURO: no focal motor/sensory deficits  LABORATORY DATA:  I have reviewed the data as listed  . CBC Latest Ref Rng & Units 04/30/2019 04/01/2019 07/30/2015  WBC 4.0 - 10.5 K/uL 6.5 6.4 -  Hemoglobin 13.0 - 17.0 g/dL 18.4(H) 18.6(H) 17.3(H)  Hematocrit 39.0 - 52.0 % 56.6(H) 54.6(H) 51.1  Platelets 150 - 400 K/uL 231 217 -    . CMP Latest Ref Rng & Units 04/30/2019 04/01/2019 07/30/2015  Glucose 70 - 99 mg/dL 108(H) 127(H) 143(H)  BUN 8 - 23 mg/dL 19 20 10   Creatinine 0.61 - 1.24 mg/dL 1.28(H) 1.42(H) 0.91  Sodium 135 - 145 mmol/L 142 138 139  Potassium 3.5 - 5.1 mmol/L 4.5 3.8 3.7  Chloride 98 - 111 mmol/L 100 102 102  CO2 22 - 32 mmol/L 30 26 28   Calcium 8.9 - 10.3 mg/dL 10.3 9.5 9.0  Total Protein 6.5 - 8.1 g/dL 7.5 - -  Total Bilirubin 0.3 - 1.2 mg/dL 0.8 - -  Alkaline Phos 38 - 126 U/L 62 - -  AST 15 - 41 U/L 24 - -  ALT 0 - 44 U/L 38 - -  11/07/2018 04/06/2019  RBC  6.8 7  Hgb  17.9 18  HCT  57.3 57.9   Component     Latest Ref Rng & Units 05/14/2015 07/24/2015 07/30/2015 04/01/2019  RBC     4.22 - 5.81 MIL/uL 6.78 (H) 6.72 (H)  6.89 (H)  Hemoglobin     13.0 - 17.0 g/dL 17.2 17.7 (H) 17.3 (H) 18.6 (H)  HCT     39.0 - 52.0 % 53.1 (H) 53.7 (H) 51.1 54.6 (H)   04/15/2005 MRI Function Brain ([facility]):      RADIOGRAPHIC  STUDIES: I have personally reviewed the radiological images as listed and agreed with the findings in the report. Mr Jeri Cos Wo Contrast  Result Date: 04/10/2019 CLINICAL DATA:  Brain tumor follow-up EXAM: MRI HEAD WITHOUT AND WITH CONTRAST TECHNIQUE: Multiplanar, multiecho pulse sequences of the brain and surrounding structures were obtained without and with intravenous contrast. CONTRAST:  76mL GADAVIST GADOBUTROL 1 MMOL/ML IV SOLN COMPARISON:  Brain MRI 04/04/2015 FINDINGS: Brain: No acute abnormality. The inferior left frontal lobe resection site is unchanged with unchanged surrounding hyperintense T2-weighted signal. The remainder of the brain parenchyma shows normal signal. Multifocal microhemorrhage at the resection site is again demonstrated. There is no abnormal contrast enhancement at the resection site. Vascular: Normal flow voids Skull and upper cervical spine: Remote left temporal craniotomy. Sinuses/Orbits: Negative Other: None IMPRESSION: Unchanged examination without residual or recurrent tumor. Electronically Signed   By: Ulyses Jarred M.D.   On: 04/10/2019 21:51    ASSESSMENT & PLAN:   63 yo with   1) Polycythemia PLAN: -Discussed patient's most recent labs from 04/06/2019, WBC at 4.51K, Lymphs Abs at 1.5K, Eos Abs at 0.0K, Baso Abs at 0.0K, Lymphs Rel at 33.8, Eos Rel at 0.5, Baso Rel at 0.7, RBC at 7.0, Hgb at 18.0, HCT at 57.9, MCV at 82.6, MCH at 25.7, MCHC at 31.1, RDW at 13.6, PLT at 198K, MPV at 7.3, Neutro Abs at 2.6K, Neutro Rel at 58.2, Mono Abs at 0.3K, Mono Rel at 6.8, Glucose at 190,BUN at 17, Creatinine at 1.0, GFR Est AFR Am at 91.6, Sodium at 139, Potassium at 4.0, Chloride at 104, CO2 at 20, Calcium at 9.0. -Discussed that Polycythemia could be due to a reactive process or a primary bone marrow problem -Discussed the increased risk of blood clots due to Polycythemia Vera if there were the final diagnosis  -Does not appear to be a primary bone marrow problem - most  likely due to dehydration or medication related (Invokana, diuretics) -Pt has no splenomegaly  -Recommended that the pt drink at least 48-64 oz of water each day.  -Will order JAK2 mutation testing to evaluate for clonal erythropoiesis. -Will get additional labs today  -Will see back in 2 weeks via phone  FOLLOW UP: -Labs today -Phone visit with Dr Irene Limbo in 2 weeks  . Orders Placed This Encounter  Procedures  . CBC with Differential/Platelet    Standing Status:   Future    Number of Occurrences:   1    Standing Expiration Date:   06/03/2020  . CMP ([place] only)    Standing Status:   Future    Number of Occurrences:   1    Standing Expiration Date:   04/29/2020  . JAK2 (including V617F and Exon 12), MPL, and CALR-Next Generation Sequencing    Standing Status:   Future    Number of Occurrences:   1    Standing Expiration Date:  04/29/2020  . Erythropoietin    Standing Status:   Future    Number of Occurrences:   1    Standing Expiration Date:   04/29/2020    All of the patients questions were answered with apparent satisfaction. The patient knows to call the clinic with any problems, questions or concerns.  I spent 30 counseling the patient face to face. The total time spent in the appointment was 45 minutes and more than 50% was on counseling and direct patient cares.    Sullivan Lone MD [place] AAHIVMS [facility] Hematology/Oncology Physician [facility]  (Office):       [phone] (Work cell):  [phone] (Fax):           [phone]  04/30/2019 5:01 PM  I, Yevette Edwards, am acting as a scribe for Dr. Sullivan Lone.   .I have reviewed the above documentation for accuracy and completeness, and I agree with the above. Brunetta Genera MD

## 2019-05-01 LAB — ERYTHROPOIETIN: Erythropoietin: 11.6 m[IU]/mL (ref 2.6–18.5)

## 2019-05-09 LAB — JAK2 (INCLUDING V617F AND EXON 12), MPL,& CALR-NEXT GEN SEQ

## 2019-05-15 ENCOUNTER — Telehealth: Payer: Self-pay | Admitting: Hematology

## 2019-05-15 NOTE — Telephone Encounter (Signed)
Confirmed telephone visit on 12/16 and verified pt's info

## 2019-05-16 ENCOUNTER — Telehealth: Payer: Self-pay | Admitting: Hematology

## 2019-05-16 ENCOUNTER — Inpatient Hospital Stay: Attending: Hematology | Admitting: Hematology

## 2019-05-16 DIAGNOSIS — Z923 Personal history of irradiation: Secondary | ICD-10-CM | POA: Insufficient documentation

## 2019-05-16 DIAGNOSIS — Z9221 Personal history of antineoplastic chemotherapy: Secondary | ICD-10-CM | POA: Diagnosis not present

## 2019-05-16 DIAGNOSIS — Z87891 Personal history of nicotine dependence: Secondary | ICD-10-CM | POA: Insufficient documentation

## 2019-05-16 DIAGNOSIS — D751 Secondary polycythemia: Secondary | ICD-10-CM | POA: Insufficient documentation

## 2019-05-16 NOTE — Progress Notes (Signed)
HEMATOLOGY/ONCOLOGY CONSULTATION NOTE  Date of Service: 05/16/2019  Patient Care Team: Leanna Battles, MD as PCP - General (Internal Medicine)  CHIEF COMPLAINTS/PURPOSE OF CONSULTATION:  Polycythemia  HISTORY OF PRESENTING ILLNESS:   Randall Lewis is a wonderful 63 y.o. 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 The pt reports that he is doing well overall.   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 This continued to be closely monitored until 2016. Pt took testosterone injections during his treatment, around 2015-2016, and he was asked to discontinue. Dr. Sharlett Iles is currently in charge of follow-up. Recently, he was set to have repeat scans. Due to his dizziness they also did lab work, which is where is polycythemia was picked up. Pt denies any issues with seizures or taking any seizure medication. He is no longer experiencing vertigo and the cause was never deduced. He took Meclizine one time but has not needed it since.   Pt was diagnosed with sleep apnea around 2003-2005 and continues to use his CPAP every night. Pt does not use it when he naps, which he does occasionally for 15-20 minutes throughout the day. His last sleep study was conducted within the last year and his sleep settings have been regularly updated.   Pt quit smoking in 1997 and had previously smoked for 23 years. He has not been diagnosed with COPD or Emphysema and does not need to use any inhalers. He denies having any heart issues but his brother did pass away from a heart attack at age 88. Pt's diabetes has been well-controlled and he has been taking Invokana for about 4-5 years. Pt has continued to take his water pills. Pt drinks a few caffeinated sodas per day or 1-2 cups of coffee and about 5 cups of water per day.   He has not felt any differently in the last 6-12  months. Pt denies any chest pain, shortness of breath, or discoloration of limbs. He does experience some itching, that has not worsened recently. He denies any sudden vision changes. Pt's weight has been fairly steady.   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 MGMT IMMUNOHISTOCHEMISTRY:NEGATIVE (1% OF TUMOR CELLS EXHIBIT STAINING)."  Most recent lab results (04/06/2019) of CBC is as follows: WBC at 4.51K, Lymphs Abs at 1.5K, Eos Abs at 0.0K, Baso Abs at 0.0K, Lymphs Rel at 33.8, Eos Rel at 0.5, Baso Rel at 0.7, RBC at 7.0, Hgb at 18.0, HCT at 57.9, MCV at 82.6, MCH at 25.7, MCHC at 31.1, RDW at 13.6, PLT at 198K, MPV at 7.3, Neutro Abs at 2.6K, Neutro Rel at 58.2, Mono Abs at 0.3K, Mono Rel at 6.8, Glucose at 190,BUN at 17, Creatinine at 1.0, GFR Est AFR Am at 91.6, Sodium at 139, Potassium at 4.0, Chloride at 104, CO2 at 20, Calcium at 9.0.  On review of systems, pt reports vertigo, itching, and denies chest pain, SOB, discoloration of limbs, sudden vision changes, mouth sores, unexpected weight loss, abdominal pain and any other symptoms.   On PMHx the pt reports Sleep Apnea, Diabetes, HTN, Anaplastic Oligodendroglioma (2006). On Social Hx the pt reports he is a previous smoker of 23 years (quit in 1997)  On Family Hx the pt reports a brother who passed from a heart attack  at 48  INTERVAL HISTORY:   I connected with  Randall Lewis on 05/16/19 by telephone and verified that I am speaking with the correct person using two identifiers.   I discussed the limitations of evaluation and management by telemedicine. The patient expressed understanding and agreed to proceed.  Other persons participating in the visit and their role in the encounter:     -Yevette Edwards, Medical Scribe  Patient's location: Home Provider's location: [facility] at [place] is a wonderful 63 y.o. male who is here for evaluation and management of polycythemia. The patient's last visit with [place] was on 04/30/2019. The pt reports that he is doing well overall.  The pt reports that he has felt well in the interim. He has had no new concerns. Pt had his latest sleep study done this year and has no history of COPD and Emphysema. He unsure if he's ever had a lung function test performed. Pt has continued to take Asprin.   Of note since the patient's last visit, pt has had JAK2 Mutation Testing completed on 04/30/2019 with results revealing "No Mutations Identified".  Lab results (04/30/19) of CBC w/diff and CMP is as follows: all values are WNL except for RBC at 7.14, Hgb at 18.4, HCT at 56.6, MCV at 79.3, MCH at 25.8, RDW at 17.1, Glucose at 108, Creatinine at 1.28. 04/30/2019 Erythropoietin at 11.6  On review of systems, pt denies any other symptoms.   MEDICAL HISTORY:  Past Medical History:  Diagnosis Date  . BPH (benign prostatic hyperplasia)   . Cancer [facility]) 2006   Brain tumor, in remission after chemo radiation and surgery  . Diabetes mellitus without complication ([place])   . ED (erectile dysfunction)   . Heart murmur    History of heart murmur  . Hyperlipidemia   . Hypertension   . Sleep apnea    USES C-PAP    SURGICAL HISTORY: Past Surgical History:  Procedure Laterality Date  . BRAIN SURGERY  2006   BRAIN TUMOR  - CANCER  . CYSTOSCOPY N/A 07/29/2015   Procedure: FLEXIBLE CYSTOSCOPY;  Surgeon: Irine Seal, MD;  Location: [facility];  Service: Urology;  Laterality: N/A;  . FOOT BONE EXCISION  1989  . PENILE PROSTHESIS IMPLANT N/A 07/29/2015   Procedure: PENILE PROSTHESIS;  Surgeon: Irine Seal, MD;  Location: [facility];  Service: Urology;  Laterality: N/A;  . right knee meniscus repair  2010  . THYROID LOBECTOMY  05/12/2012   Procedure: THYROID LOBECTOMY;  Surgeon: Earnstine Regal, MD;  Location: [facility];  Service: General;   Laterality: Left;  left thyroid lobectomy    SOCIAL HISTORY: Social History   Socioeconomic History  . Marital status: Married    Spouse name: Not on file  . Number of children: Not on file  . Years of education: Not on file  . Highest education level: Not on file  Occupational History  . Not on file  Tobacco Use  . Smoking status: Former Smoker    Packs/day: 1.00    Years: 23.00    Pack years: 23.00    Types: Cigarettes    Quit date: 04/26/1996    Years since quitting: 23.0  . Smokeless tobacco: Never Used  Substance and Sexual Activity  . Alcohol use: No  . Drug use: No  . Sexual activity: Not on file  Other Topics Concern  . Not on file  Social History Narrative  . Not on file  Social Determinants of Health   Financial Resource Strain:   . Difficulty of Paying Living Expenses: Not on file  Food Insecurity:   . Worried About Charity fundraiser in the Last Year: Not on file  . Ran Out of Food in the Last Year: Not on file  Transportation Needs:   . Lack of Transportation (Medical): Not on file  . Lack of Transportation (Non-Medical): Not on file  Physical Activity:   . Days of Exercise per Week: Not on file  . Minutes of Exercise per Session: Not on file  Stress:   . Feeling of Stress : Not on file  Social Connections:   . Frequency of Communication with Friends and Family: Not on file  . Frequency of Social Gatherings with Friends and Family: Not on file  . Attends Religious Services: Not on file  . Active Member of Clubs or Organizations: Not on file  . Attends Archivist Meetings: Not on file  . Marital Status: Not on file  Intimate Partner Violence:   . Fear of Current or Ex-Partner: Not on file  . Emotionally Abused: Not on file  . Physically Abused: Not on file  . Sexually Abused: Not on file    FAMILY HISTORY: Family History  Problem Relation Age of Onset  . Cancer Mother        Breast Cancer  . Cancer Brother 34  . Birth defects  Son     ALLERGIES:  is allergic to gadolinium derivatives.  MEDICATIONS:  Current Outpatient Medications  Medication Sig Dispense Refill  . aspirin 81 MG tablet Take 81 mg by mouth every morning.     . docusate sodium (COLACE) 100 MG capsule Take 1 capsule (100 mg total) by mouth 2 (two) times daily. 10 capsule 0  . fish oil-omega-3 fatty acids 1000 MG capsule Take 2 g by mouth daily.     . INVOKANA 100 MG TABS tablet Take 100 mg by mouth daily.     . meclizine (ANTIVERT) 25 MG tablet Take 1 tablet (25 mg total) by mouth 3 (three) times daily as needed for dizziness. 30 tablet 0  . metFORMIN (GLUCOPHAGE-XR) 500 MG 24 hr tablet Take 500 mg by mouth daily.     Marland Kitchen oxyCODONE (OXY IR/ROXICODONE) 5 MG immediate release tablet Take 1-2 tablets (5-10 mg total) by mouth every 6 (six) hours as needed for moderate pain. 20 tablet 0  . senna (SENOKOT) 8.6 MG TABS tablet Take 1 tablet (8.6 mg total) by mouth 2 (two) times daily. 120 each 0  . simvastatin (ZOCOR) 20 MG tablet Take 20 mg by mouth daily.    . tamsulosin (FLOMAX) 0.4 MG CAPS capsule Take 1 capsule (0.4 mg total) by mouth daily. 14 capsule 0  . valsartan-hydrochlorothiazide (DIOVAN-HCT) 320-12.5 MG tablet Take 1 tablet by mouth daily.     . Vitamin D, Ergocalciferol, (DRISDOL) 50000 UNITS CAPS capsule Take 50,000 Units by mouth every Friday.      No current facility-administered medications for this visit.    REVIEW OF SYSTEMS:   A 10+ POINT REVIEW OF SYSTEMS WAS OBTAINED including neurology, dermatology, psychiatry, cardiac, respiratory, lymph, extremities, GI, GU, Musculoskeletal, constitutional, breasts, reproductive, HEENT.  All pertinent positives are noted in the HPI.  All others are negative.   PHYSICAL EXAMINATION: ECOG PERFORMANCE STATUS: 0 - Asymptomatic  . There were no vitals filed for this visit. There were no vitals filed for this visit. .There is no height or weight on  file to calculate BMI.  Telehealth  visit  LABORATORY DATA:  I have reviewed the data as listed  . CBC Latest Ref Rng & Units 04/30/2019 04/01/2019 07/30/2015  WBC 4.0 - 10.5 K/uL 6.5 6.4 -  Hemoglobin 13.0 - 17.0 g/dL 18.4(H) 18.6(H) 17.3(H)  Hematocrit 39.0 - 52.0 % 56.6(H) 54.6(H) 51.1  Platelets 150 - 400 K/uL 231 217 -    . CMP Latest Ref Rng & Units 04/30/2019 04/01/2019 07/30/2015  Glucose 70 - 99 mg/dL 108(H) 127(H) 143(H)  BUN 8 - 23 mg/dL 19 20 10   Creatinine 0.61 - 1.24 mg/dL 1.28(H) 1.42(H) 0.91  Sodium 135 - 145 mmol/L 142 138 139  Potassium 3.5 - 5.1 mmol/L 4.5 3.8 3.7  Chloride 98 - 111 mmol/L 100 102 102  CO2 22 - 32 mmol/L 30 26 28   Calcium 8.9 - 10.3 mg/dL 10.3 9.5 9.0  Total Protein 6.5 - 8.1 g/dL 7.5 - -  Total Bilirubin 0.3 - 1.2 mg/dL 0.8 - -  Alkaline Phos 38 - 126 U/L 62 - -  AST 15 - 41 U/L 24 - -  ALT 0 - 44 U/L 38 - -    04/30/2019 JAK2, MPL, CALR:     11/07/2018 04/06/2019  RBC  6.8 7  Hgb  17.9 18  HCT  57.3 57.9   Component     Latest Ref Rng & Units 05/14/2015 07/24/2015 07/30/2015 04/01/2019  RBC     4.22 - 5.81 MIL/uL 6.78 (H) 6.72 (H)  6.89 (H)  Hemoglobin     13.0 - 17.0 g/dL 17.2 17.7 (H) 17.3 (H) 18.6 (H)  HCT     39.0 - 52.0 % 53.1 (H) 53.7 (H) 51.1 54.6 (H)   04/15/2005 MRI Function Brain ([facility]):      RADIOGRAPHIC STUDIES: I have personally reviewed the radiological images as listed and agreed with the findings in the report. No results found.  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 PLAN: -Discussed pt labwork, 04/30/19;  all values are WNL except for RBC at 7.14, Hgb at 18.4, HCT at 56.6, MCV at 79.3, MCH at 25.8, RDW at 17.1, Glucose at 108, Creatinine at 1.28. -Discussed 04/30/2019 Erythropoietin WNL at 11.6 -Discussed 04/30/2019 JAK2, MPL, CALR mutation testing revealed "No Mutations Identified".  -Labs suggest Secondary Polycythemia  -Most likely cause of secondary  polycythemia is medication (Invokana, diuretics) and dehydration -Recommend pt stay well hydrated and drink at least 2 liters of water per day -Recommend pt be switched from Invokana to another blood glucose controlling medication -Recommend pt be taken off diuretics HCTif possible  -Recommend pt be considered for PFTs -- will defer to PCP -Recommended pt f/u with Dr. Philip Aspen to discuss medication alternatives -Will see back in 3 months with labs   FOLLOW UP: RTC with Dr Irene Limbo with labs in 3 months   Orders Placed This Encounter  Procedures  . CBC with Differential/Platelet    Standing Status:   Future    Standing Expiration Date:   06/19/2020  . CMP ([place] only)    Standing Status:   Future    Standing Expiration Date:   05/15/2020    The total time spent in the appt was 15 minutes and more than 50% was on counseling and direct patient cares.  All of the patient's questions were answered with apparent satisfaction. The patient knows to call the clinic with any problems,  questions or concerns.    Sullivan Lone MD [place] AAHIVMS [facility] Hematology/Oncology Physician [facility]  (Office):       [phone] (Work cell):  [phone] (Fax):           [phone]  05/16/2019 4:26 PM  I, Yevette Edwards, am acting as a scribe for Dr. Sullivan Lone.   .I have reviewed the above documentation for accuracy and completeness, and I agree with the above. Brunetta Genera MD

## 2019-05-16 NOTE — Telephone Encounter (Signed)
Scheduled appt per 12/16 los.  Spoke with pt and he is aware of his appt date and time.

## 2019-08-04 ENCOUNTER — Ambulatory Visit: Attending: Internal Medicine

## 2019-08-04 DIAGNOSIS — Z23 Encounter for immunization: Secondary | ICD-10-CM | POA: Insufficient documentation

## 2019-08-04 NOTE — Progress Notes (Signed)
   Covid-19 Vaccination Clinic  Name:  YOUSUF Lewis    MRN: [id number] DOB: [birth year]  08/04/2019  [name] was observed post Covid-19 immunization for 15 minutes without incident. He was provided with Vaccine Information Sheet and instruction to access the V-Safe system.   [name] was instructed to call 911 with any severe reactions post vaccine: Marland Kitchen Difficulty breathing  . Swelling of face and throat  . A fast heartbeat  . A bad rash all over body  . Dizziness and weakness   Immunizations Administered    Name Date Dose VIS Date Route   Pfizer COVID-19 Vaccine 08/04/2019 10:36 AM 0.3 mL 05/11/2019 Intramuscular   Manufacturer: [place]   Lot: [id number]   [place]: [id number]

## 2019-08-14 ENCOUNTER — Inpatient Hospital Stay: Attending: Hematology

## 2019-08-14 ENCOUNTER — Inpatient Hospital Stay (HOSPITAL_BASED_OUTPATIENT_CLINIC_OR_DEPARTMENT_OTHER): Admitting: Hematology

## 2019-08-14 ENCOUNTER — Other Ambulatory Visit: Payer: Self-pay

## 2019-08-14 VITALS — BP 116/71 | HR 61 | Temp 97.9°F | Resp 18 | Ht 71.0 in | Wt 242.5 lb

## 2019-08-14 DIAGNOSIS — R011 Cardiac murmur, unspecified: Secondary | ICD-10-CM | POA: Diagnosis not present

## 2019-08-14 DIAGNOSIS — E119 Type 2 diabetes mellitus without complications: Secondary | ICD-10-CM | POA: Diagnosis not present

## 2019-08-14 DIAGNOSIS — E785 Hyperlipidemia, unspecified: Secondary | ICD-10-CM | POA: Insufficient documentation

## 2019-08-14 DIAGNOSIS — Z7982 Long term (current) use of aspirin: Secondary | ICD-10-CM | POA: Diagnosis not present

## 2019-08-14 DIAGNOSIS — N400 Enlarged prostate without lower urinary tract symptoms: Secondary | ICD-10-CM | POA: Insufficient documentation

## 2019-08-14 DIAGNOSIS — Z87891 Personal history of nicotine dependence: Secondary | ICD-10-CM | POA: Diagnosis not present

## 2019-08-14 DIAGNOSIS — I10 Essential (primary) hypertension: Secondary | ICD-10-CM | POA: Insufficient documentation

## 2019-08-14 DIAGNOSIS — Z79899 Other long term (current) drug therapy: Secondary | ICD-10-CM | POA: Diagnosis not present

## 2019-08-14 DIAGNOSIS — D751 Secondary polycythemia: Secondary | ICD-10-CM

## 2019-08-14 DIAGNOSIS — G4730 Sleep apnea, unspecified: Secondary | ICD-10-CM | POA: Insufficient documentation

## 2019-08-14 LAB — CMP (CANCER CENTER ONLY)
ALT: 31 U/L (ref 0–44)
AST: 22 U/L (ref 15–41)
Albumin: 4.2 g/dL (ref 3.5–5.0)
Alkaline Phosphatase: 61 U/L (ref 38–126)
Anion gap: 13 (ref 5–15)
BUN: 18 mg/dL (ref 8–23)
CO2: 29 mmol/L (ref 22–32)
Calcium: 10 mg/dL (ref 8.9–10.3)
Chloride: 100 mmol/L (ref 98–111)
Creatinine: 1.23 mg/dL (ref 0.61–1.24)
GFR, Est AFR Am: >60 mL/min (ref >60)
GFR, Estimated: >60 mL/min (ref >60)
Glucose, Bld: 80 mg/dL (ref 70–99)
Potassium: 4.1 mmol/L (ref 3.5–5.1)
Sodium: 142 mmol/L (ref 135–145)
Total Bilirubin: 0.4 mg/dL (ref 0.3–1.2)
Total Protein: 7.4 g/dL (ref 6.5–8.1)

## 2019-08-14 LAB — CBC WITH DIFFERENTIAL/PLATELET
Abs Immature Granulocytes: 0.01 10*3/uL (ref 0.00–0.07)
Basophils Absolute: 0 10*3/uL (ref 0.0–0.1)
Basophils Relative: 1 %
Eosinophils Absolute: 0 10*3/uL (ref 0.0–0.5)
Eosinophils Relative: 1 %
HCT: 53.9 % — ABNORMAL HIGH (ref 39.0–52.0)
Hemoglobin: 17.9 g/dL — ABNORMAL HIGH (ref 13.0–17.0)
Immature Granulocytes: 0 %
Lymphocytes Relative: 36 %
Lymphs Abs: 2.5 10*3/uL (ref 0.7–4.0)
MCH: 26.1 pg (ref 26.0–34.0)
MCHC: 33.2 g/dL (ref 30.0–36.0)
MCV: 78.7 fL — ABNORMAL LOW (ref 80.0–100.0)
Monocytes Absolute: 0.6 10*3/uL (ref 0.1–1.0)
Monocytes Relative: 9 %
Neutro Abs: 3.7 10*3/uL (ref 1.7–7.7)
Neutrophils Relative %: 53 %
Platelets: 217 10*3/uL (ref 150–400)
RBC: 6.85 MIL/uL — ABNORMAL HIGH (ref 4.22–5.81)
RDW: 15.6 % — ABNORMAL HIGH (ref 11.5–15.5)
WBC: 6.8 10*3/uL (ref 4.0–10.5)
nRBC: 0 % (ref 0.0–0.2)

## 2019-08-14 NOTE — Progress Notes (Signed)
HEMATOLOGY/ONCOLOGY CLINIC NOTE  Date of Service: 08/14/2019  Patient Care Team: Leanna Battles, MD as PCP - General (Internal Medicine)  CHIEF COMPLAINTS/PURPOSE OF CONSULTATION:  Polycythemia  HISTORY OF PRESENTING ILLNESS:   Randall Lewis is a wonderful 64 y.o. 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 The pt reports that he is doing well overall.   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 This continued to be closely monitored until 2016. Pt took testosterone injections during his treatment, around 2015-2016, and he was asked to discontinue. Dr. Sharlett Iles is currently in charge of follow-up. Recently, he was set to have repeat scans. Due to his dizziness they also did lab work, which is where is polycythemia was picked up. Pt denies any issues with seizures or taking any seizure medication. He is no longer experiencing vertigo and the cause was never deduced. He took Meclizine one time but has not needed it since.   Pt was diagnosed with sleep apnea around 2003-2005 and continues to use his CPAP every night. Pt does not use it when he naps, which he does occasionally for 15-20 minutes throughout the day. His last sleep study was conducted within the last year and his sleep settings have been regularly updated.   Pt quit smoking in 1997 and had previously smoked for 23 years. He has not been diagnosed with COPD or Emphysema and does not need to use any inhalers. He denies having any heart issues but his brother did pass away from a heart attack at age 20. Pt's diabetes has been well-controlled and he has been taking Invokana for about 4-5 years. Pt has continued to take his water pills. Pt drinks a few caffeinated sodas per day or 1-2 cups of coffee and about 5 cups of water per day.   He has not felt any differently in the last 6-12 months.  Pt denies any chest pain, shortness of breath, or discoloration of limbs. He does experience some itching, that has not worsened recently. He denies any sudden vision changes. Pt's weight has been fairly steady.   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 MGMT IMMUNOHISTOCHEMISTRY:NEGATIVE (1% OF TUMOR CELLS EXHIBIT STAINING)."  Most recent lab results (04/06/2019) of CBC is as follows: WBC at 4.51K, Lymphs Abs at 1.5K, Eos Abs at 0.0K, Baso Abs at 0.0K, Lymphs Rel at 33.8, Eos Rel at 0.5, Baso Rel at 0.7, RBC at 7.0, Hgb at 18.0, HCT at 57.9, MCV at 82.6, MCH at 25.7, MCHC at 31.1, RDW at 13.6, PLT at 198K, MPV at 7.3, Neutro Abs at 2.6K, Neutro Rel at 58.2, Mono Abs at 0.3K, Mono Rel at 6.8, Glucose at 190,BUN at 17, Creatinine at 1.0, GFR Est AFR Am at 91.6, Sodium at 139, Potassium at 4.0, Chloride at 104, CO2 at 20, Calcium at 9.0.  On review of systems, pt reports vertigo, itching, and denies chest pain, SOB, discoloration of limbs, sudden vision changes, mouth sores, unexpected weight loss, abdominal pain and any other symptoms.   On PMHx the pt reports Sleep Apnea, Diabetes, HTN, Anaplastic Oligodendroglioma (2006). On Social Hx the pt reports he is a previous smoker of 23 years (quit in 1997)  On Family Hx the pt reports a brother who passed from a heart attack  at 69  INTERVAL HISTORY:   Randall Lewis is a wonderful 64 y.o. male who is here for evaluation and management of polycythemia. The patient's last visit with [place] was on 05/16/2019. The pt reports that he is doing well overall.  The pt reports that he has had no recent medication changes. Pt is currently drinking 3-4, 10 oz glasses of water per day. Pt has received his first dose of the COVID19 vaccine and tolerated it well. He has his second dose scheduled for 03/22.   Lab results today  (08/14/19) of CBC w/diff and CMP is as follows: all values are WNL except for RBC at 6.85, Hgb at 17.9, HCT at 53.9, MCV at 78.7, RDW at 15.6.  On review of systems, pt denies headaches, vision changes, abdominal pain and any other symptoms.   MEDICAL HISTORY:  Past Medical History:  Diagnosis Date  . BPH (benign prostatic hyperplasia)   . Cancer [facility]) 2006   Brain tumor, in remission after chemo radiation and surgery  . Diabetes mellitus without complication ([place])   . ED (erectile dysfunction)   . Heart murmur    History of heart murmur  . Hyperlipidemia   . Hypertension   . Sleep apnea    USES C-PAP    SURGICAL HISTORY: Past Surgical History:  Procedure Laterality Date  . BRAIN SURGERY  2006   BRAIN TUMOR  - CANCER  . CYSTOSCOPY N/A 07/29/2015   Procedure: FLEXIBLE CYSTOSCOPY;  Surgeon: Irine Seal, MD;  Location: [facility];  Service: Urology;  Laterality: N/A;  . FOOT BONE EXCISION  1989  . PENILE PROSTHESIS IMPLANT N/A 07/29/2015   Procedure: PENILE PROSTHESIS;  Surgeon: Irine Seal, MD;  Location: [facility];  Service: Urology;  Laterality: N/A;  . right knee meniscus repair  2010  . THYROID LOBECTOMY  05/12/2012   Procedure: THYROID LOBECTOMY;  Surgeon: Earnstine Regal, MD;  Location: [facility];  Service: General;  Laterality: Left;  left thyroid lobectomy    SOCIAL HISTORY: Social History   Socioeconomic History  . Marital status: Married    Spouse name: Not on file  . Number of children: Not on file  . Years of education: Not on file  . Highest education level: Not on file  Occupational History  . Not on file  Tobacco Use  . Smoking status: Former Smoker    Packs/day: 1.00    Years: 23.00    Pack years: 23.00    Types: Cigarettes    Quit date: 04/26/1996    Years since quitting: 23.3  . Smokeless tobacco: Never Used  Substance and Sexual Activity  . Alcohol use: No  . Drug use: No  . Sexual activity: Not on file  Other Topics Concern  . Not on file  Social History  Narrative  . Not on file   Social Determinants of Health   Financial Resource Strain:   . Difficulty of Paying Living Expenses:   Food Insecurity:   . Worried About Charity fundraiser in the Last Year:   . Arboriculturist in the Last Year:   Transportation Needs:   . Film/video editor (Medical):   Marland Kitchen Lack of Transportation (Non-Medical):   Physical Activity:   . Days of Exercise per Week:   . Minutes of Exercise per Session:   Stress:   . Feeling of Stress :   Social Connections:   . Frequency of Communication with Friends and Family:   . Frequency of  Social Gatherings with Friends and Family:   . Attends Religious Services:   . Active Member of Clubs or Organizations:   . Attends Archivist Meetings:   Marland Kitchen Marital Status:   Intimate Partner Violence:   . Fear of Current or Ex-Partner:   . Emotionally Abused:   Marland Kitchen Physically Abused:   . Sexually Abused:     FAMILY HISTORY: Family History  Problem Relation Age of Onset  . Cancer Mother        Breast Cancer  . Cancer Brother 75  . Birth defects Son     ALLERGIES:  is allergic to gadolinium derivatives.  MEDICATIONS:  Current Outpatient Medications  Medication Sig Dispense Refill  . aspirin 81 MG tablet Take 81 mg by mouth every morning.     . fish oil-omega-3 fatty acids 1000 MG capsule Take 2 g by mouth daily.     . INVOKANA 100 MG TABS tablet Take 100 mg by mouth daily.     . metFORMIN (GLUCOPHAGE-XR) 500 MG 24 hr tablet Take 500 mg by mouth daily.     Marland Kitchen senna (SENOKOT) 8.6 MG TABS tablet Take 1 tablet (8.6 mg total) by mouth 2 (two) times daily. 120 each 0  . simvastatin (ZOCOR) 20 MG tablet Take 20 mg by mouth daily.    . tamsulosin (FLOMAX) 0.4 MG CAPS capsule Take 1 capsule (0.4 mg total) by mouth daily. 14 capsule 0  . valsartan-hydrochlorothiazide (DIOVAN-HCT) 320-12.5 MG tablet Take 1 tablet by mouth daily.     . Vitamin D, Ergocalciferol, (DRISDOL) 50000 UNITS CAPS capsule Take 50,000 Units by  mouth every Friday.     . docusate sodium (COLACE) 100 MG capsule Take 1 capsule (100 mg total) by mouth 2 (two) times daily. (Patient not taking: Reported on 08/14/2019) 10 capsule 0  . meclizine (ANTIVERT) 25 MG tablet Take 1 tablet (25 mg total) by mouth 3 (three) times daily as needed for dizziness. (Patient not taking: Reported on 08/14/2019) 30 tablet 0   No current facility-administered medications for this visit.    REVIEW OF SYSTEMS:   A 10+ POINT REVIEW OF SYSTEMS WAS OBTAINED including neurology, dermatology, psychiatry, cardiac, respiratory, lymph, extremities, GI, GU, Musculoskeletal, constitutional, breasts, reproductive, HEENT.  All pertinent positives are noted in the HPI.  All others are negative.   PHYSICAL EXAMINATION: ECOG PERFORMANCE STATUS: 0 - Asymptomatic  . Vitals:   08/14/19 1516  BP: 116/71  Pulse: 61  Resp: 18  Temp: 97.9 F (36.6 C)  SpO2: 99%   Filed Weights   08/14/19 1516  Weight: 242 lb 8 oz (110 kg)   .Body mass index is 33.82 kg/m.  GENERAL:alert, in no acute distress and comfortable SKIN: no acute rashes, no significant lesions EYES: conjunctiva are pink and non-injected, sclera anicteric OROPHARYNX: MMM, no exudates, no oropharyngeal erythema or ulceration NECK: supple, no JVD LYMPH:  no palpable lymphadenopathy in the cervical, axillary or inguinal regions LUNGS: clear to auscultation b/l with normal respiratory effort HEART: regular rate & rhythm ABDOMEN:  normoactive bowel sounds , non tender, not distended. No palpable hepatosplenomegaly.  Extremity: no pedal edema PSYCH: alert & oriented x 3 with fluent speech NEURO: no focal motor/sensory deficits  LABORATORY DATA:  I have reviewed the data as listed  . CBC Latest Ref Rng & Units 08/14/2019 04/30/2019 04/01/2019  WBC 4.0 - 10.5 K/uL 6.8 6.5 6.4  Hemoglobin 13.0 - 17.0 g/dL 17.9(H) 18.4(H) 18.6(H)  Hematocrit 39.0 - 52.0 % 53.9(H) 56.6(H)  54.6(H)  Platelets 150 - 400 K/uL 217  231 217    . CMP Latest Ref Rng & Units 08/14/2019 04/30/2019 04/01/2019  Glucose 70 - 99 mg/dL 80 108(H) 127(H)  BUN 8 - 23 mg/dL 18 19 20   Creatinine 0.61 - 1.24 mg/dL 1.23 1.28(H) 1.42(H)  Sodium 135 - 145 mmol/L 142 142 138  Potassium 3.5 - 5.1 mmol/L 4.1 4.5 3.8  Chloride 98 - 111 mmol/L 100 100 102  CO2 22 - 32 mmol/L 29 30 26   Calcium 8.9 - 10.3 mg/dL 10.0 10.3 9.5  Total Protein 6.5 - 8.1 g/dL 7.4 7.5 -  Total Bilirubin 0.3 - 1.2 mg/dL 0.4 0.8 -  Alkaline Phos 38 - 126 U/L 61 62 -  AST 15 - 41 U/L 22 24 -  ALT 0 - 44 U/L 31 38 -    04/30/2019 JAK2, MPL, CALR:     11/07/2018 04/06/2019  RBC  6.8 7  Hgb  17.9 18  HCT  57.3 57.9   Component     Latest Ref Rng & Units 05/14/2015 07/24/2015 07/30/2015 04/01/2019  RBC     4.22 - 5.81 MIL/uL 6.78 (H) 6.72 (H)  6.89 (H)  Hemoglobin     13.0 - 17.0 g/dL 17.2 17.7 (H) 17.3 (H) 18.6 (H)  HCT     39.0 - 52.0 % 53.1 (H) 53.7 (H) 51.1 54.6 (H)   04/15/2005 MRI Function Brain ([facility]):      RADIOGRAPHIC STUDIES: I have personally reviewed the radiological images as listed and agreed with the findings in the report. No results found.  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 PLAN: -Discussed pt labwork today, 08/14/19; blood counts slightly improved, RBC, Hgb, and HCT are still elevated, blood chemistries are steady -Advised pt that for secondary polycythemia we would not typically do repeated therapeutic phlebotomies. -Advised pt that to improve his counts we would treat the underlying cause of polycythemia. Most likely medication related.  -Would recommend pt discussed with PCP about discontinuing Invokana and HCTZ -If Invokana and HCTZ are discontinued and HCT is still elevated would recommend pt receive PFT's to r/o chronic lung disease -Recommended that the pt continue to eat well, drink at least 48-64 oz of water each day, and walk 20-30  minutes each day. Water needs may be increased due to diuretic.  -Recommend pt f/u as scheduled for the second dose of the COVID19 vaccine -Recommend pt f/u with Dr. Philip Aspen to discuss medication alternatives -Will allow Dr. Philip Aspen to continue to follow labs -Will see back as needed    FOLLOW UP: RTC with PCP . We will see him back as needed  The total time spent in the appt was 20 minutes and more than 50% was on counseling and direct patient cares.  All of the patient's questions were answered with apparent satisfaction. The patient knows to call the clinic with any problems, questions or concerns.    Sullivan Lone MD [place] AAHIVMS [facility] Hematology/Oncology Physician [facility]  (Office):       [phone] (Work cell):  [phone] (Fax):           [phone]  08/14/2019 4:17 PM  I, Yevette Edwards, am acting as a scribe for Dr. Sullivan Lone.   .I have reviewed the above documentation for accuracy and completeness, and I agree with the above. Brunetta Genera MD

## 2019-08-25 ENCOUNTER — Ambulatory Visit: Attending: Internal Medicine

## 2019-08-25 DIAGNOSIS — Z23 Encounter for immunization: Secondary | ICD-10-CM

## 2019-08-25 NOTE — Progress Notes (Signed)
   Covid-19 Vaccination Clinic  Name:  Randall Lewis    MRN: [id number] DOB: Mar 25, 1956  08/25/2019  Randall Lewis was observed post Covid-19 immunization for 15 minutes without incident. He was provided with Vaccine Information Sheet and instruction to access the V-Safe system.   Randall Lewis was instructed to call 911 with any severe reactions post vaccine: Marland Kitchen Difficulty breathing  . Swelling of face and throat  . A fast heartbeat  . A bad rash all over body  . Dizziness and weakness   Immunizations Administered    Name Date Dose VIS Date Route   Pfizer COVID-19 Vaccine 08/25/2019 11:55 AM 0.3 mL 05/11/2019 Intramuscular   Manufacturer: [place]   Lot: [id number]   [place]: [id number]

## 2019-12-04 ENCOUNTER — Encounter: Payer: Self-pay | Admitting: Gastroenterology

## 2020-01-15 ENCOUNTER — Other Ambulatory Visit: Payer: Self-pay

## 2020-01-15 ENCOUNTER — Ambulatory Visit (AMBULATORY_SURGERY_CENTER): Payer: Self-pay | Admitting: *Deleted

## 2020-01-15 VITALS — Ht 71.0 in | Wt 244.0 lb

## 2020-01-15 DIAGNOSIS — Z1211 Encounter for screening for malignant neoplasm of colon: Secondary | ICD-10-CM

## 2020-01-15 MED ORDER — NA SULFATE-K SULFATE-MG SULF 17.5-3.13-1.6 GM/177ML PO SOLN: 1.0000 | Freq: Once | ORAL | 0 refills | Status: AC

## 2020-01-15 NOTE — Progress Notes (Signed)

## 2020-01-29 ENCOUNTER — Encounter: Payer: Self-pay | Admitting: Gastroenterology

## 2020-01-29 ENCOUNTER — Ambulatory Visit (AMBULATORY_SURGERY_CENTER): Admitting: Gastroenterology

## 2020-01-29 ENCOUNTER — Other Ambulatory Visit: Payer: Self-pay

## 2020-01-29 VITALS — BP 129/74 | HR 54 | Temp 97.9°F | Resp 16 | Ht 71.0 in | Wt 244.0 lb

## 2020-01-29 DIAGNOSIS — Z1211 Encounter for screening for malignant neoplasm of colon: Secondary | ICD-10-CM | POA: Diagnosis not present

## 2020-01-29 DIAGNOSIS — D128 Benign neoplasm of rectum: Secondary | ICD-10-CM

## 2020-01-29 DIAGNOSIS — D122 Benign neoplasm of ascending colon: Secondary | ICD-10-CM

## 2020-01-29 MED ORDER — SODIUM CHLORIDE 0.9 % IV SOLN: 500.0000 mL | Freq: Once | INTRAVENOUS | Status: DC

## 2020-01-29 NOTE — Progress Notes (Signed)
Pt. Reports no change in his medical or surgical history since his pre-visit 01/15/2020.

## 2020-01-29 NOTE — Progress Notes (Signed)
PT taken to PACU. Monitors in place. VSS. Report given to RN. 

## 2020-01-29 NOTE — Patient Instructions (Signed)
Read all of the handouts given to you by your recovery room nurse.  Thank-you for choosing us for your healthcare needs today.  YOU HAD AN ENDOSCOPIC PROCEDURE TODAY AT THE [facility]:   Refer to the procedure report that was given to you for any specific questions about what was found during the examination.  If the procedure report does not answer your questions, please call your gastroenterologist to clarify.  If you requested that your care partner not be given the details of your procedure findings, then the procedure report has been included in a sealed envelope for you to review at your convenience later.  YOU SHOULD EXPECT: Some feelings of bloating in the abdomen. Passage of more gas than usual.  Walking can help get rid of the air that was put into your GI tract during the procedure and reduce the bloating. If you had a lower endoscopy (such as a colonoscopy or flexible sigmoidoscopy) you may notice spotting of blood in your stool or on the toilet paper. If you underwent a bowel prep for your procedure, you may not have a normal bowel movement for a few days.  Please Note:  You might notice some irritation and congestion in your nose or some drainage.  This is from the oxygen used during your procedure.  There is no need for concern and it should clear up in a day or so.  SYMPTOMS TO REPORT IMMEDIATELY:   Following lower endoscopy (colonoscopy or flexible sigmoidoscopy):  Excessive amounts of blood in the stool  Significant tenderness or worsening of abdominal pains  Swelling of the abdomen that is new, acute  Fever of 100F or higher   For urgent or emergent issues, a gastroenterologist can be reached at any hour by calling [phone]. Do not use MyChart messaging for urgent concerns.    DIET:  We do recommend a small meal at first, but then you may proceed to your regular diet.  Drink plenty of fluids but you should avoid alcoholic beverages for 24 hours. Try to  increase the fiber in your diet, and drink plenty of water.  ACTIVITY:  You should plan to take it easy for the rest of today and you should NOT DRIVE or use heavy machinery until tomorrow (because of the sedation medicines used during the test).    FOLLOW UP: Our staff will call the number listed on your records 48-72 hours following your procedure to check on you and address any questions or concerns that you may have regarding the information given to you following your procedure. If we do not reach you, we will leave a message.  We will attempt to reach you two times.  During this call, we will ask if you have developed any symptoms of COVID 19. If you develop any symptoms (ie: fever, flu-like symptoms, shortness of breath, cough etc.) before then, please call [phone].  If you test positive for Covid 19 in the 2 weeks post procedure, please call and report this information to us.    If any biopsies were taken you will be contacted by phone or by letter within the next 1-3 weeks.  Please call us at [phone] if you have not heard about the biopsies in 3 weeks.    SIGNATURES/CONFIDENTIALITY: You and/or your care partner have signed paperwork which will be entered into your electronic medical record.  These signatures attest to the fact that that the information above on your After Visit Summary has been reviewed   reviewed and is understood.  Full responsibility of the confidentiality of this discharge information lies with you and/or your care-partner. 

## 2020-01-29 NOTE — Op Note (Signed)
[facility] Patient Name: Randall Lewis Procedure Date: 01/29/2020 9:09 AM MRN: [id number] Endoscopist: Mallie Mussel L. Loletha Carrow , MD Age: 64 Referring MD:  Date of Birth: 11-Sep-1955 Gender: Male Account #: [id number] Procedure:                Colonoscopy Indications:              Screening for colorectal malignant neoplasm                            (reportedly no polyps on last colonoscopy 10 years                            ago) Medicines:                Monitored Anesthesia Care Procedure:                Pre-Anesthesia Assessment:                           - Prior to the procedure, a History and Physical                            was performed, and patient medications and                            allergies were reviewed. The patient's tolerance of                            previous anesthesia was also reviewed. The risks                            and benefits of the procedure and the sedation                            options and risks were discussed with the patient.                            All questions were answered, and informed consent                            was obtained. Prior Anticoagulants: The patient has                            taken no previous anticoagulant or antiplatelet                            agents except for aspirin. ASA Grade Assessment: II                            - A patient with mild systemic disease. After                            reviewing the risks and benefits, the patient was  deemed in satisfactory condition to undergo the                            procedure.                           After obtaining informed consent, the colonoscope                            was passed under direct vision. Throughout the                            procedure, the patient's blood pressure, pulse, and                            oxygen saturations were monitored continuously. The                            Colonoscope was  introduced through the anus and                            advanced to the the cecum, identified by                            appendiceal orifice and ileocecal valve. The                            colonoscopy was performed without difficulty. The                            patient tolerated the procedure well. The quality                            of the bowel preparation was excellent. The                            ileocecal valve, appendiceal orifice, and rectum                            were photographed. Scope In: 9:17:29 AM Scope Out: 9:35:43 AM Scope Withdrawal Time: 0 hours 14 minutes 38 seconds  Total Procedure Duration: 0 hours 18 minutes 14 seconds  Findings:                 The perianal and digital rectal examinations were                            normal.                           Multiple diverticula were found in the left colon                            and right colon.  Two sessile polyps were found in the distal rectum                            and proximal ascending colon. The polyps were                            diminutive in size. These polyps were removed with                            a cold snare. Resection and retrieval were complete.                           Internal hemorrhoids were found. The hemorrhoids                            were small.                           The exam was otherwise without abnormality on                            direct and retroflexion views. Complications:            No immediate complications. Estimated Blood Loss:     Estimated blood loss was minimal. Impression:               - Diverticulosis in the left colon and in the right                            colon.                           - Two diminutive polyps in the distal rectum and in                            the proximal ascending colon, removed with a cold                            snare. Resected and retrieved.                            - Internal hemorrhoids.                           - The examination was otherwise normal on direct                            and retroflexion views. Recommendation:           - Patient has a contact number available for                            emergencies. The signs and symptoms of potential                            delayed complications were discussed with the  patient. Return to normal activities tomorrow.                            Written discharge instructions were provided to the                            patient.                           - Resume previous diet.                           - Continue present medications.                           - Await pathology results.                           - Repeat colonoscopy is recommended for                            surveillance. The colonoscopy date will be                            determined after pathology results from today's                            exam become available for review. Daniele Dillow L. Loletha Carrow, MD 01/29/2020 9:40:12 AM This report has been signed electronically.

## 2020-01-31 ENCOUNTER — Telehealth: Payer: Self-pay | Admitting: *Deleted

## 2020-01-31 NOTE — Telephone Encounter (Signed)
No answer/busy signal for post procedure follow up.

## 2020-01-31 NOTE — Telephone Encounter (Signed)
°  Follow up Call-  Call back number 01/29/2020  Post procedure Call Back phone  # [phone]  Permission to leave phone message Yes  Some recent data might be hidden     Patient questions:  Do you have a fever, pain , or abdominal swelling? No. Pain Score  0 *  Have you tolerated food without any problems? Yes.    Have you been able to return to your normal activities? Yes.    Do you have any questions about your discharge instructions: Diet   No. Medications  No. Follow up visit  No.  Do you have questions or concerns about your Care? No.  Actions: * If pain score is 4 or above: No action needed, pain <4.  1. Have you developed a fever since your procedure? no  2.   Have you had an respiratory symptoms (SOB or cough) since your procedure? no  3.   Have you tested positive for COVID 19 since your procedure no  4.   Have you had any family members/close contacts diagnosed with the COVID 19 since your procedure?  no   If yes to any of these questions please route to Joylene John, RN and Joella Prince, RN

## 2020-02-05 ENCOUNTER — Encounter: Payer: Self-pay | Admitting: Gastroenterology

## 2021-02-05 ENCOUNTER — Encounter: Payer: Self-pay | Admitting: Neurology

## 2021-02-05 ENCOUNTER — Other Ambulatory Visit: Payer: Self-pay

## 2021-02-05 ENCOUNTER — Ambulatory Visit (INDEPENDENT_AMBULATORY_CARE_PROVIDER_SITE_OTHER): Admitting: Neurology

## 2021-02-05 VITALS — BP 137/84 | HR 62 | Ht 71.0 in | Wt 251.0 lb

## 2021-02-05 DIAGNOSIS — G4719 Other hypersomnia: Secondary | ICD-10-CM

## 2021-02-05 DIAGNOSIS — G4733 Obstructive sleep apnea (adult) (pediatric): Secondary | ICD-10-CM

## 2021-02-05 DIAGNOSIS — F515 Nightmare disorder: Secondary | ICD-10-CM | POA: Insufficient documentation

## 2021-02-05 DIAGNOSIS — C719 Malignant neoplasm of brain, unspecified: Secondary | ICD-10-CM | POA: Diagnosis not present

## 2021-02-05 DIAGNOSIS — Z9989 Dependence on other enabling machines and devices: Secondary | ICD-10-CM

## 2021-02-05 MED ORDER — MELATONIN 3 MG PO TABS
3.0000 mg | ORAL_TABLET | Freq: Every day | ORAL | 0 refills | Status: DC
Start: 2021-02-05 — End: 2022-02-08

## 2021-02-05 NOTE — Progress Notes (Signed)
Former sleep study not done at [place] in 2020. Followed for sleep by [facility] in [place].     SLEEP MEDICINE CLINIC    Provider:  Larey Seat, MD  Primary Care Physician:  Leanna Battles, [place] [id number]     Referring Provider: Leanna Battles, [place],  [place] [id number]          Chief Complaint according to patient   Patient presents with:     New Patient (Initial Visit)           HISTORY OF PRESENT ILLNESS:  Randall Lewis is a 65 y.o. year old 29 or African American male patient seen here as a referral on 02/05/2021 from Dr Philip Aspen for a transfer of sleep care- but is an active patient at [facility] in [place], last sleep study 05-2018.  Machine is  only 64 years old. .  Chief concern according to patient :  sleep was great on CPAP and has now changed, I have such vivid lucid  dreams that I can't rest and feel not refreshed. No sleep paralysis since childhood, Easily falling asleep and easy to go to dream stages.  He reports acting out dreams. Kicking, running, but not yelling in his sleep.    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    The patient had the first sleep study in the year 2005  with a result of an AHI ( Apnea Hypopnea index)  of OSA. Dr. Buel Ream office provided 2 copies the last sleep study is dated 06-27-2018 and describes the patient at 65 year old referred for reevaluation of obstructive sleep apnea.  History of depression, history of brain tumor.  The patient had an AHI of 32.5 which is a  significant degree of apnea.  In rem sleep or dream sleep his AHI rose to 70/h and in non-REM sleep only 22.4/h.  Apnea was of course more severe while sleeping in supine AHI of 45.8/h.  Nadir of oxygenation was 72%.  Total time of oxygen saturation under 90% was 39.5 minutes.  This would be considered prolonged.  Cardiac monitoring showed normal sinus rhythm at the time.  The patient was invited for a CPAP with titration the titration table shows pressures beginning at only 5 cmH2O during which the patient slept 173.2 minutes with 52% sleep efficiency and an AHI of 3.1 increasing the pressure step-by-step to final 8 cmH2O provided 100% sleep efficiency total sleep time 137 minutes and an AHI of 0.4 so this was revealed sleep spoke.  I was able to review a recent download 30 out of 30 days with 100% compliance by days 97% compliance of CPAP use 5 hours average user time 7 hours 10 minutes at night.  CPAP is set to 8 cm water pressure this is not an autotitrator air leak test 2.8 L/min very low for a nasal CPAP user and excellent result.  Residual AHI is 1.4.  So we will nonrestorative sleep quality is unlikely to be related to apnea.  I reviewed the medication I think that the only medication that may interfere with the patient's screen activity  could be bupropion or Wellbutrin which she takes that only 100 mg in the morning so I do not think there is a larger effect possible.     Sleep relevant medical history: Nocturia 2-4 , vertigo, Parasomnia , brain tumor, operatively and radiation and chemo. [employer].     Family medical /sleep history: brother and nephew on CPAP with OSA,    Social history:  Patient is working as Clinical biochemist, Primary school teacher, and lives in a household with spouse  adult children.  Tobacco use quit 25 years ago- cigarettes,  ETOH use ; rare , Caffeine intake in form of Coffee( 2 cups ) Soda( 1 a day) Tea ( or 1 a day ) or energy drinks.      Sleep habits are as  follows: The patient's dinner time is between 6.30 PM. The patient goes to bed at 11 PM but is often asleep watching TV in the den before going to bed-  and continues to sleep for 6-7 hours, wakes for many bathroom breaks, the first time at 2 AM.   The preferred sleep position is supine and sideways, not prone- , with the support of 2 pillows. Dreams are reportedly frequent/vivid. Nasal CPAP, N mask.  6.30-7  AM is the usual rise time. The patient wakes up spontaneously. He/ She reports not feeling refreshed or restored in AM, with symptoms such as dry mouth, and residual fatigue. Naps are taken frequently, lasting from 15 to 20 minutes and are more refreshing than nocturnal sleep.    Review of Systems: Out of a complete 14 system review, the patient complains of only the following symptoms, and all other reviewed systems are negative.:  Fatigue, sleepiness , snoring, fragmented sleep.  PARASOMNIA with vivid dream enactment.    How likely are you to doze in the following situations: 0 = not likely, 1 = slight chance, 2 = moderate chance, 3 = high chance   Sitting and Reading? Watching Television? Sitting inactive in a public place (theater or meeting)? As a passenger in a car for an hour without a break? Lying down in the afternoon when circumstances permit? Sitting and talking to someone? Sitting quietly after lunch without alcohol? In a car, while stopped for a few minutes in traffic?   Total = 14/ 24 points   FSS endorsed at 14/ 63 points.   GERIATRIC DEPRESSION SCORE 9/ 15 !!!  Social History   Socioeconomic History   Marital status: Married    Spouse name: [name]   Number of children: 2   Years of education: Not on file   Highest education level: Master's degree (e.g., MA, MS, MEng, MEd, MSW, MBA)  Occupational History   Not on file  Tobacco Use   Smoking status: Former    Packs/day: 1.00    Years: 23.00    Pack years: 23.00    Types: Cigarettes    Quit date:  04/26/1996    Years since quitting: 24.7   Smokeless tobacco: Never  Substance and Sexual Activity   Alcohol use: No   Drug use: No   Sexual activity: Not on file  Other Topics Concern   Not on file  Social History Narrative   Lives with wife   Right handed   Caffeine: 2 cups of coffee and 1 glass of tea/soda a day   Social Determinants of Health   Financial Resource Strain: Not on file  Food Insecurity: Not on file  Transportation Needs: Not on file  Physical Activity: Not on file  Stress: Not on file  Social Connections: Not on file    Family History  Problem Relation Age of Onset   Cancer Mother        Breast Cancer   Cancer Brother 71   Heart attack Brother    Heart attack Brother    Birth defects Son    Colon cancer Neg Hx    Colon polyps Neg Hx    Esophageal cancer Neg Hx    Stomach cancer Neg Hx    Rectal cancer Neg Hx     Past Medical History:  Diagnosis Date   BPH (benign prostatic hyperplasia)    Cancer ([place] [facility]) 2006   Brain tumor, in remission after chemo radiation and surgery   Diabetes mellitus without complication ([facility])    ED (erectile dysfunction)    Heart murmur    History of heart murmur   Hyperlipidemia    Hypertension    Sleep apnea    USES C-PAP    Past Surgical History:  Procedure Laterality Date   BRAIN SURGERY  2006   BRAIN TUMOR  - CANCER   COLONOSCOPY     CYSTOSCOPY N/A 07/29/2015   Procedure: FLEXIBLE CYSTOSCOPY;  Surgeon: Irine Seal, MD;  Location: [facility];  Service: Urology;  Laterality: N/A;   FOOT BONE EXCISION  1989   PENILE PROSTHESIS IMPLANT N/A 07/29/2015   Procedure: PENILE PROSTHESIS;  Surgeon: Irine Seal, MD;  Location: [facility];  Service: Urology;  Laterality: N/A;   right knee meniscus repair  2010   THYROID LOBECTOMY  05/12/2012   Procedure: THYROID LOBECTOMY;  Surgeon: Earnstine Regal, MD;  Location: [facility];  Service: General;  Laterality: Left;  left thyroid lobectomy     Current Outpatient Medications on File Prior  to Visit  Medication Sig Dispense Refill   aspirin 81 MG tablet Take 81 mg by mouth every morning.      buPROPion (WELLBUTRIN SR) 100 MG 12 hr tablet Take 100 mg by mouth every morning.     docusate sodium (COLACE) 100 MG capsule Take 1 capsule (100 mg total) by mouth 2 (two) times daily. (Patient not taking: Reported on 01/29/2020) 10 capsule 0   fish oil-omega-3 fatty acids 1000 MG capsule Take 2 g by mouth daily.      meclizine (ANTIVERT) 25 MG tablet Take 1 tablet (25 mg total) by mouth 3 (three) times daily as needed for dizziness. (Patient not taking: Reported on 01/29/2020) 30 tablet 0   metFORMIN (GLUCOPHAGE-XR) 500 MG 24 hr tablet Take 500 mg by mouth daily.      pioglitazone (ACTOS) 15 MG tablet Take 15 mg by mouth daily.     senna (SENOKOT) 8.6 MG TABS tablet Take 1 tablet (8.6 mg total) by mouth 2 (two) times daily. (Patient not taking: Reported on 01/15/2020) 120 each 0   simvastatin (ZOCOR) 20 MG tablet Take 20 mg by mouth daily.     valsartan-hydrochlorothiazide (DIOVAN-HCT) 320-12.5 MG tablet Take 1 tablet by mouth daily.      Vitamin D, Ergocalciferol, (DRISDOL) 50000 UNITS CAPS capsule Take 50,000 Units by mouth every Friday.      No current facility-administered medications on file prior to visit.    Allergies  Allergen Reactions   Gadolinium Derivatives Nausea And Vomiting    Per pt , always get sick with gado and never offered anti nausea meds.    Physical exam:  Today's Vitals   02/05/21 0836  BP: 137/84  Pulse: 62  Weight: 251 lb (113.9 kg)  Height: 5' 11"  (7.425 m)   Body mass index is 35.01 kg/m.   Wt Readings from Last 3 Encounters:  02/05/21 251 lb (113.9 kg)  01/29/20 244 lb (110.7 kg)  01/15/20 244 lb (110.7 kg)     Ht Readings from Last 3 Encounters:  02/05/21 5' 11"  (1.803 m)  01/29/20 5' 11"  (1.803 m)  01/15/20 5' 11"  (1.803 m)      General: The patient is awake, alert and appears not in acute distress. The patient is well groomed. Head:  Normocephalic, atraumatic. Neck is supple. Mallampati ,3 plus   neck circumference:18.5 inches . Nasal airflow  patent.  Retrognathia is seen.  Dental status: / Cardiovascular:  Regular rate and cardiac rhythm by pulse,  without distended neck veins. Respiratory: Lungs are clear to auscultation.  Skin:  Without evidence of ankle edema, or rash. Trunk: The patient's posture is erect.   Neurologic exam : The patient is awake and alert, oriented to place and time.   Memory subjective described as intact.  Attention span & concentration ability appears normal.  Speech is fluent,  without  dysarthria, dysphonia or aphasia.  Mood and affect are appropriate.   Cranial nerves: no loss of smell or taste reported  Pupils are equal and briskly reactive to light. Funduscopic exam deferred. .  Extraocular movements in vertical and horizontal planes were intact and without nystagmus. No Diplopia. Visual fields by finger perimetry are intact. Hearing was intact to soft voice and finger rubbing.    Facial sensation intact to fine touch.  Facial motor strength is symmetric and tongue and uvula move midline. Bilateral ptosis,  Neck ROM : rotation, tilt and flexion extension were normal for age and shoulder shrug was symmetrical.    Motor exam:  Symmetric bulk, tone and ROM.   Normal tone without cog wheeling, symmetric grip strength .   Sensory:  Fine touch and vibration was normal. At knee and hands, reduced at ankles.   Proprioception tested in the upper extremities was normal.   Coordination: Rapid alternating movements in the fingers/hands were of normal speed.  The Finger-to-nose maneuver was intact without evidence of ataxia, dysmetria or tremor.   Gait and station: Patient could rise unassisted from a seated position, walked without assistive device.  Stance is of normal width/ base and the patient turned with 3 steps.  Toe and heel walk were deferred.  Deep tendon reflexes: in the  upper  and lower extremities are symmetric and intact.  Babinski response was deferred.        After spending a total time of  60  minutes face to face and including additional time for physical and neurologic examination, review of laboratory studies,  personal review of imaging studies, reports and results of other testing and review of referral information / records as far as provided in visit, I have established the following assessments:  1) the patient's sleep apnea is excellently treated and he is an active patient of another sleep clinic.  His AHI is 1.4 and his last sleep study was not even 3 years ago. Ambien made him hallucinate.  I doubt that apnea contributes in any way to Mr. Tamer less restorative sleep given the high resolution on current low pressure CPAP.  What I want to see is if he has developed REM behavior disorder REM behavior disorder can be a result of medication, of a degenerative brain process, and sometimes does not early  morning sign of Parkinson's.  However usually REM behavior precedes any development of Parkinson's disease by about 10 years.  I would need to correlate his sleep to PLM's so my goal is to invite him for sleep study on CPAP watch if his REM sleep on CPAP is correlating with erratic limb movements or increased muscle tone.  And if that is the case we will treat him with melatonin and a REM suppressant medication.  If this is not the case and his AHI is still under 5/h I would let him stay the next day for an M SLT to follow a multiple mean sleep latency test.  I also will send for an HLA narcolepsy test today.   My Plan is to proceed with:  REM sleep behavior disorder PSG do be preformed in lab while on CPAP at 8 cm water.     1) start melatonin 5 mg at night. OTC  2) depression is clinically present, I will not wean Wellbutrin.    I would like to thank Leanna Battles, MD and Leanna Battles, [place],  [place] [address] for allowing me  to meet with and to take care of this pleasant patient.   In short, OLLY SHINER is presenting with parasomnia and vivid dreams.  I plan to follow up either personally or through our NP within 2-4 month.   CC: I will share my notes with PCP. Marland Kitchen  Electronically signed by: Larey Seat, MD 02/05/2021 9:24 AM  [facility] Board certified by The [employer] of Sleep Medicine and Diplomate of the [employer] of Sleep Medicine. Board certified In Neurology through the [employer], Fellow of the [employer] of Neurology. Medical Director of [facility].

## 2021-03-18 ENCOUNTER — Ambulatory Visit (INDEPENDENT_AMBULATORY_CARE_PROVIDER_SITE_OTHER): Admitting: Neurology

## 2021-03-18 ENCOUNTER — Other Ambulatory Visit: Payer: Self-pay

## 2021-03-18 DIAGNOSIS — Z9989 Dependence on other enabling machines and devices: Secondary | ICD-10-CM

## 2021-03-18 DIAGNOSIS — G4750 Parasomnia, unspecified: Secondary | ICD-10-CM

## 2021-03-18 DIAGNOSIS — G4733 Obstructive sleep apnea (adult) (pediatric): Secondary | ICD-10-CM | POA: Diagnosis not present

## 2021-03-18 DIAGNOSIS — G478 Other sleep disorders: Secondary | ICD-10-CM

## 2021-03-20 DIAGNOSIS — G4750 Parasomnia, unspecified: Secondary | ICD-10-CM | POA: Insufficient documentation

## 2021-03-20 DIAGNOSIS — G478 Other sleep disorders: Secondary | ICD-10-CM | POA: Insufficient documentation

## 2021-03-20 NOTE — Addendum Note (Signed)
Addended by: Larey Seat on: 03/20/2021 04:06 PM   Modules accepted: Orders

## 2021-03-20 NOTE — Procedures (Signed)
PATIENT'S NAME:  Randall Lewis, Randall Lewis DOB:      01-Aug-1955      MR#:    [id number]     DATE OF RECORDING: 03/18/2021 M. Saunders Revel REFERRING M.D.:  Leanna Battles, MD Study Performed:   CPAP  Titration HISTORY:  Randall Lewis, a  65 y.o. 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  The CPAP Machine is not yet 65 years old.  He has a past medical history of BPH, Brain tumor, stage 3, Glioblastoma- (2006), Diabetes mellitus without complication [facility]), ED (erectile dysfunction), Heart murmur, Hyperlipidemia, Hypertension, and Sleep apnea. I was able to review a recent CPAP download: Use on 30 out of 30 days with 100% compliance, and 97% compliance by hours: average user time is 7 hours 10 minutes at night. CPAP is set to 8 cm water pressure (This is not an auto-titrator) air leak @ 2.8 L/min -very low for a nasal CPAP user and excellent result.  Residual AHI is only 1.4/h.  =His non-restorative sleep quality is unlikely to be related to apnea.    Sleep relevant medical history: Nocturia 2-4, vertigo, Parasomnia, GBM 3 brain tumor, treated operatively and followed by radiation and chemotherapy at [facility].   Patient states: "My Sleep was great on CPAP but has now changed, I have such vivid lucid dreams that I can't rest and feel not refreshed". No sleep paralysis since childhood, he is easily falling asleep and easy to go to dream stages.  He reports acting out dreams and is kicking, running, but not yelling .   The patient endorsed the Epworth Sleepiness Scale at 14 points.   The patient's weight 251 pounds with a height of 71 (inches), resulting in a BMI of 35.2 kg/m2. The patient's neck circumference measured 18.5 inches.  CURRENT MEDICATIONS: Aspirin,  Wellbutrin, Colace, fish oil, Antivert, Glucophage, Actos, Senokot, Zocor, Diovan-HCT, Drisdol   PROCEDURE:  This is a multichannel digital polysomnogram utilizing the SomnoStar 11.2 system.  Electrodes and sensors were applied and monitored per AASM Specifications.   EEG, EOG, Chin and Limb EMG, were sampled at 200 Hz.  ECG, Snore and Nasal Pressure, Thermal Airflow, Respiratory Effort, CPAP Flow and Pressure, Oximetry was sampled at 50 Hz. Digital video and audio were recorded.      CPAP was initiated with a mask of patient's choice (large AirFit N20)  at 8 cmH20 with heated humidity per AASM split night standards and pressure was advanced to 9/cmH20 because of snoring and desaturations.   At a PAP pressure of 9 cmH20, there was a reduction of the AHI to 1.4/h.  Lights Out was at 21:41 and Lights On at 05:27. Total recording time (TRT) was 467 minutes, with a total sleep time (TST) of 338.5 minutes. The patient's sleep latency was 27.5 minutes. REM latency was 128 minutes.  The sleep efficiency was 72.5 %.    SLEEP ARCHITECTURE: WASO (Wake after sleep onset) was 99.5 minutes.  There were 14.5 minutes in Stage N1, 175.5 minutes Stage N2, 2 minutes Stage N3 and 146.5 minutes in Stage REM.   The percentage of Stage N1 was 4.3%, Stage N2 was 51.8%, Stage N3 was 0.6% and Stage R (REM sleep) was 43.3%. All REM sleep was seen  under 9 cm water pressure CPAP.    RESPIRATORY ANALYSIS:  There was a total of 7 respiratory events: 0 obstructive apneas, 1 central apnea and 2 mixed apneas with a total of 3 apneas and an apnea index (AHI) of .5 /hour. There were 4 hypopneas with a hypopnea index of 0.7/hour.  The total APNEA/HYPOPNEA INDEX (AHI) was 1.2 /hour.  6 events occurred in REM sleep and 1 events in NREM. The REM AHI was 2.5 /hour versus a non-REM AHI of 0.3 /hour.   The patient spent all 338.5 minutes of total sleep time in the supine position. OXYGEN SATURATION & C02:  The baseline 02 saturation was 94%,  with the lowest being 90%. Time spent below 89% saturation equaled 0 minutes.  The arousals were noted as: 36 were spontaneous, 0 were associated with PLMs, 0 were associated with respiratory events. The patient had a total of 0 Periodic Limb Movements.  Audio and video analysis did not show any abnormal or unusual movements, behaviors, phonations or vocalizations.  Snoring was noted under the beginning pressure of 8 cm water CPAP. EKG was in keeping with regular but bradycardic regular rhythm.  EEG did not indicate abnormal or epileptiform activity.    DIAGNOSIS: No evidence of parasomnia activity. A very unusual sleep architecture with high REM sleep proportion was noted and no apnea interrupted REM sleep.  Obstructive Sleep Apnea was controlled under 8 cm water pressure, but snoring was not. Increase to only 9 cm water pressure allowed for REM sleep rebounding and improved sleep efficiency. There was no hypoxemia.  EKG / heart rate was slow for much of the night.  See screen shot attached.    PLANS/RECOMMENDATIONS: Patient's CPAP settings were slightly altered, to a final 9 cm water, he will continue to use his current machine under new settings, and his preferred mask in large size, an AirFit N20.  Follow-up yearly with NP in our sleep clinic for supplies and for a new machine in in 2025.  A follow up appointment will be scheduled with our NPs in the Sleep Clinic at [facility].   Please call [phone] with any questions.      I certify that I have reviewed the entire raw data recording prior to the issuance of this report in accordance with the Standards of Accreditation of the American Academy of Sleep Medicine (AASM)   Larey Seat, M.D. Market researcher, [facility] at [employer]

## 2021-03-20 NOTE — Progress Notes (Signed)
DIAGNOSIS: No evidence of parasomnia activity. A very unusual sleep architecture with high REM sleep proportion was noted and no apnea interrupted REM sleep.  1. Obstructive Sleep Apnea was controlled under 8 cm water pressure, but snoring was not. Increase to only 9 cm water pressure allowed for REM sleep rebounding and improved sleep efficiency. 2. There was no hypoxemia.  3. EKG / heart rate was slow for much of the night.  See screen shot attached.    PLANS/RECOMMENDATIONS: 1. Patient's CPAP settings were slightly altered, to a final 9 cm water, he will continue to use his current machine under new settings, and his preferred mask in large size, an AirFit N20.  Follow-up yearly with NP in our sleep clinic for supplies and for a new machine in in 2025.

## 2021-03-23 ENCOUNTER — Telehealth: Payer: Self-pay | Admitting: *Deleted

## 2021-03-23 NOTE — Telephone Encounter (Signed)
Called and spoke with pt about results. He verbalized understanding. I scheduled yearly f/u for 02/08/22 at 1:30p w/ Dr. Brett Fairy. Faxed orders to [facility] at [phone]. Received fax confirmation.

## 2021-03-23 NOTE — Telephone Encounter (Signed)
-----   Message from Larey Seat, MD sent at 03/20/2021  4:06 PM EDT ----- DIAGNOSIS: No evidence of parasomnia activity. A very unusual sleep architecture with high REM sleep proportion was noted and no apnea interrupted REM sleep.  1. Obstructive Sleep Apnea was controlled under 8 cm water pressure, but snoring was not. Increase to only 9 cm water pressure allowed for REM sleep rebounding and improved sleep efficiency. 2. There was no hypoxemia.  3. EKG / heart rate was slow for much of the night.  See screen shot attached.    PLANS/RECOMMENDATIONS: 1. Patient's CPAP settings were slightly altered, to a final 9 cm water, he will continue to use his current machine under new settings, and his preferred mask in large size, an AirFit N20.  Follow-up yearly with NP in our sleep clinic for supplies and for a new machine in in 2025.

## 2021-04-22 ENCOUNTER — Other Ambulatory Visit: Payer: Self-pay | Admitting: Internal Medicine

## 2021-04-22 DIAGNOSIS — R519 Headache, unspecified: Secondary | ICD-10-CM

## 2021-05-20 ENCOUNTER — Ambulatory Visit
Admission: RE | Admit: 2021-05-20 | Discharge: 2021-05-20 | Disposition: A | Discharge status: Home / Self Care | Source: Ambulatory Visit | Attending: Internal Medicine | Admitting: Internal Medicine

## 2021-05-20 DIAGNOSIS — R519 Headache, unspecified: Secondary | ICD-10-CM

## 2021-05-20 MED ORDER — GADOBUTROL 1 MMOL/ML IV SOLN
10.0000 mL | Freq: Once | INTRAVENOUS | Status: AC | PRN
  Administered 2021-05-20: 12:00:00 10 mL via INTRAVENOUS

## 2021-10-08 ENCOUNTER — Ambulatory Visit: Admitting: Orthopedic Surgery

## 2021-10-08 ENCOUNTER — Ambulatory Visit (INDEPENDENT_AMBULATORY_CARE_PROVIDER_SITE_OTHER)

## 2021-10-08 DIAGNOSIS — M1712 Unilateral primary osteoarthritis, left knee: Secondary | ICD-10-CM | POA: Diagnosis not present

## 2021-10-08 DIAGNOSIS — M25561 Pain in right knee: Secondary | ICD-10-CM | POA: Diagnosis not present

## 2021-10-08 DIAGNOSIS — M25562 Pain in left knee: Secondary | ICD-10-CM

## 2021-10-10 ENCOUNTER — Encounter: Payer: Self-pay | Admitting: Orthopedic Surgery

## 2021-10-10 NOTE — Progress Notes (Signed)
? ?Office Visit Note ?  ?Patient: Randall Lewis           ?Date of Birth: Nov 30, 1955           ?MRN: [id number] ?Visit Date: 10/08/2021 ?             ?Requested by: Donnajean Lopes, MD ?[address] ?[place] [address] ?PCP: Donnajean Lopes, MD ? ?Chief Complaint  ?Patient presents with  ? Right Knee - Pain  ? ? ? ? ?HPI: ?Patient is a 66 year old gentleman with osteoarthritis both knees recently the left knee has been more painful.  He states that he had an injection of the left knee about 2 months ago without relief.  Patient has been using a compression sleeve which she feels helps and has been using Aleve. ? ?Assessment & Plan: ?Visit Diagnoses:  ?1. Right knee pain, unspecified chronicity   ?2. Unilateral primary osteoarthritis, left knee   ? ? ?Plan: Discussed patient's treatment options with strengthening anti-inflammatories steroid injections versus gel injections.  Discussed total knee arthroplasty.  Patient states he would like to continue with conservative therapy and strengthening exercises were demonstrated.  He will use Aleve 2 p.o. twice daily. ? ?Follow-Up Instructions: Return if symptoms worsen or fail to improve.  ? ?Ortho Exam ? ?Patient is alert, oriented, no adenopathy, well-dressed, normal affect, normal respiratory effort. ?Examination patient has good range of motion of the left knee collaterals and cruciates are stable.  He has tenderness to palpation of the medial joint line there are no mechanical symptoms with flexion and rotation.  There is no effusion no redness no cellulitis. ? ?Imaging: ?No results found. ?No images are attached to the encounter. ? ?Labs: ?Lab Results  ?Component Value Date  ? HGBA1C 6.7 (H) 07/24/2015  ? REPTSTATUS 08/04/2015 FINAL 08/02/2015  ? CULT  08/02/2015  ?  NO GROWTH 2 DAYS ?Performed at [facility] ?  ? ? ? ?Lab Results  ?Component Value Date  ? ALBUMIN 4.2 08/14/2019  ? ALBUMIN 4.3 04/30/2019  ? ALBUMIN 4.7 12/21/2005  ? ? ?No  results found for: MG ?No results found for: VD25OH ? ?No results found for: PREALBUMIN ? ?  Latest Ref Rng & Units 08/14/2019  ?  2:38 PM 04/30/2019  ? 11:26 AM 04/01/2019  ? 10:35 PM  ?CBC EXTENDED  ?WBC 4.0 - 10.5 K/uL 6.8   6.5   6.4    ?RBC 4.22 - 5.81 MIL/uL 6.85   7.14   6.89    ?Hemoglobin 13.0 - 17.0 g/dL 17.9   18.4   18.6    ?HCT 39.0 - 52.0 % 53.9   56.6   54.6    ?Platelets 150 - 400 K/uL 217   231   217    ?NEUT# 1.7 - 7.7 K/uL 3.7   3.7     ?Lymph# 0.7 - 4.0 K/uL 2.5   2.2     ? ? ? ?There is no height or weight on file to calculate BMI. ? ?Orders:  ?Orders Placed This Encounter  ?Procedures  ? XR Knee 1-2 Views Left  ? ?No orders of the defined types were placed in this encounter. ? ? ? Procedures: ?No procedures performed ? ?Clinical Data: ?No additional findings. ? ?ROS: ? ?All other systems negative, except as noted in the HPI. ?Review of Systems ? ?Objective: ?Vital Signs: There were no vitals taken for this visit. ? ?Specialty Comments:  ?No specialty comments available. ? ?[place]  History: ?Patient Active Problem List  ? Diagnosis Date Noted  ? Parasomnia associated with another health condition 03/20/2021  ? Non-restorative sleep 03/20/2021  ? Nightmares REM-sleep type 02/05/2021  ? Excessive daytime sleepiness 02/05/2021  ? OSA on CPAP 02/05/2021  ? 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 Erectile dysfunction 07/29/2015  ? 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 Neoplasm of uncertain behavior of thyroid gland, left lobe 04/26/2012  ? ?Past Medical History:  ?Diagnosis Date  ? BPH (benign prostatic hyperplasia)   ? Cancer [facility]) 2006  ? Brain tumor, in remission after chemo radiation and surgery  ? Diabetes mellitus without complication ([place])   ? ED (erectile dysfunction)   ? Heart murmur   ? History of heart murmur  ? Hyperlipidemia   ? Hypertension   ? Sleep apnea   ? USES C-PAP  ?  ?Family History  ?Problem Relation Age of Onset  ? Cancer Mother   ?     Breast  Cancer  ? Cancer Brother 22  ? Heart attack Brother   ? Heart attack Brother   ? Birth defects Son   ? Colon cancer Neg Hx   ? Colon polyps Neg Hx   ? Esophageal cancer Neg Hx   ? Stomach cancer Neg Hx   ? Rectal cancer Neg Hx   ?  ?Past Surgical History:  ?Procedure Laterality Date  ? BRAIN SURGERY  2006  ? BRAIN TUMOR  - CANCER  ? COLONOSCOPY    ? CYSTOSCOPY N/A 07/29/2015  ? Procedure: FLEXIBLE CYSTOSCOPY;  Surgeon: Irine Seal, MD;  Location: [facility];  Service: Urology;  Laterality: N/A;  ? FOOT BONE EXCISION  1989  ? PENILE PROSTHESIS IMPLANT N/A 07/29/2015  ? Procedure: PENILE PROSTHESIS;  Surgeon: Irine Seal, MD;  Location: [facility];  Service: Urology;  Laterality: N/A;  ? right knee meniscus repair  2010  ? THYROID LOBECTOMY  05/12/2012  ? Procedure: THYROID LOBECTOMY;  Surgeon: Earnstine Regal, MD;  Location: [facility];  Service: General;  Laterality: Left;  left thyroid lobectomy  ? ?Social History  ? ?Occupational History  ? Not on file  ?Tobacco Use  ? Smoking status: Former  ?  Packs/day: 1.00  ?  Years: 23.00  ?  Pack years: 23.00  ?  Types: Cigarettes  ?  Quit date: 04/26/1996  ?  Years since quitting: 25.4  ? Smokeless tobacco: Never  ?Substance and Sexual Activity  ? Alcohol use: No  ? Drug use: No  ? Sexual activity: Not on file  ? ? ? ? ? ?

## 2022-02-08 ENCOUNTER — Encounter: Payer: Self-pay | Admitting: Neurology

## 2022-02-08 ENCOUNTER — Ambulatory Visit: Admitting: Neurology

## 2022-02-08 VITALS — BP 142/81 | HR 76 | Ht 71.0 in | Wt 248.4 lb

## 2022-02-08 DIAGNOSIS — G4733 Obstructive sleep apnea (adult) (pediatric): Secondary | ICD-10-CM | POA: Diagnosis not present

## 2022-02-08 DIAGNOSIS — G4710 Hypersomnia, unspecified: Secondary | ICD-10-CM | POA: Diagnosis not present

## 2022-02-08 DIAGNOSIS — C719 Malignant neoplasm of brain, unspecified: Secondary | ICD-10-CM

## 2022-02-08 DIAGNOSIS — Z9989 Dependence on other enabling machines and devices: Secondary | ICD-10-CM

## 2022-02-08 NOTE — Patient Instructions (Signed)
Quality Sleep Information, Adult Quality sleep is important for your mental and physical health. It also improves your quality of life. Quality sleep means you: Are asleep for most of the time you are in bed. Fall asleep within 30 minutes. Wake up no more than once a night. Are awake for no longer than 20 minutes if you do wake up during the night. Most adults need 7-8 hours of quality sleep each night. How can poor sleep affect me? If you do not get enough quality sleep, you may have: Mood swings. Daytime sleepiness. Decreased alertness, reaction time, and concentration. Sleep disorders, such as insomnia and sleep apnea. Difficulty with: Solving problems. Coping with stress. Paying attention. These issues may affect your performance and productivity at work, school, and home. Lack of sleep may also put you at higher risk for accidents, suicide, and risky behaviors. If you do not get quality sleep, you may also be at higher risk for several health problems, including: Infections. Type 2 diabetes. Heart disease. High blood pressure. Obesity. Worsening of long-term conditions, like arthritis, kidney disease, depression, Parkinson's disease, and epilepsy. What actions can I take to get more quality sleep? Sleep schedule and routine Stick to a sleep schedule. Go to sleep and wake up at about the same time each day. Do not try to sleep less on weekdays and make up for lost sleep on weekends. This does not work. Limit naps during the day to 30 minutes or less. Do not take naps in the late afternoon. Make time to relax before bed. Reading, listening to music, or taking a hot bath promotes quality sleep. Make your bedroom a place that promotes quality sleep. Keep your bedroom dark, quiet, and at a comfortable room temperature. Make sure your bed is comfortable. Avoid using electronic devices that give off bright blue light for 30 minutes before bedtime. Your brain perceives bright blue light  as sunlight. This includes television, phones, and computers. If you are lying awake in bed for longer than 20 minutes, get up and do a relaxing activity until you feel sleepy. Lifestyle     Try to get at least 30 minutes of exercise on most days. Do not exercise 2-3 hours before going to bed. Do not use any products that contain nicotine or tobacco. These products include cigarettes, chewing tobacco, and vaping devices, such as e-cigarettes. If you need help quitting, ask your health care provider. Do not drink caffeinated beverages for at least 8 hours before going to bed. Coffee, tea, and some sodas contain caffeine. Do not drink alcohol or eat large meals close to bedtime. Try to get at least 30 minutes of sunlight every day. Morning sunlight is best. Medical concerns Work with your health care provider to treat medical conditions that may affect sleeping, such as: Nasal obstruction. Snoring. Sleep apnea and other sleep disorders. Talk to your health care provider if you think any of your prescription medicines may cause you to have difficulty falling or staying asleep. If you have sleep problems, talk with a sleep consultant. If you think you have a sleep disorder, talk with your health care provider about getting evaluated by a specialist. Where to find more information Sleep Foundation: sleepfoundation.org American Academy of Sleep Medicine: aasm.org Centers for Disease Control and Prevention (CDC): StoreMirror.com.cy Contact a health care provider if: You have trouble getting to sleep or staying asleep. You often wake up very early in the morning and cannot get back to sleep. You have daytime sleepiness. You  have daytime sleep attacks of suddenly falling asleep and sudden muscle weakness (narcolepsy). You have a tingling sensation in your legs with a strong urge to move your legs (restless legs syndrome). You stop breathing briefly during sleep (sleep apnea). You think you have a sleep  disorder or are taking a medicine that is affecting your quality of sleep. Summary Most adults need 7-8 hours of quality sleep each night. Getting enough quality sleep is important for your mental and physical health. Make your bedroom a place that promotes quality sleep, and avoid things that may cause you to have poor sleep, such as alcohol, caffeine, smoking, or large meals. Talk to your health care provider if you have trouble falling asleep or staying asleep. This information is not intended to replace advice given to you by your health care provider. Make sure you discuss any questions you have with your health care provider. Document Revised: 09/09/2021 Document Reviewed: 09/09/2021 Elsevier Patient Education  2023 Elsevier Inc.  

## 2022-02-08 NOTE — Progress Notes (Signed)
Former sleep study not done at [facility] in 2020. Followed for sleep by [facility] in [place].     SLEEP MEDICINE CLINIC    Provider:  Larey Seat, MD  Primary Care Physician:  Donnajean Lopes, [place] [address]     Referring Provider: Donnajean Lopes, [place],  [place] [address]          Chief Complaint according to patient   Patient presents with:     New Patient (Initial Visit)     Anaplastic glioma, followed at [facility],  was on CPAP when referred here-       HISTORY OF PRESENT ILLNESS:  Randall Lewis is a 66 y.o. year old Black or African American full time working patient - he works on Landscape architect and crawl spaces. He is seen here in a RV on  02/08/2022 , And  to follow up on a sleep test .  DIAGNOSIS: No evidence of parasomnia activity. A very unusual sleep architecture with high REM sleep proportion was noted and no apnea interrupted REM sleep.  1.        Obstructive Sleep Apnea was controlled under 8 cm water pressure, but snoring was not. Increase to only 9 cm water pressure allowed for REM sleep rebounding and improved sleep efficiency. 2.        There was no hypoxemia.  3.        EKG / heart rate was slow for much of the night. See screen shot attached.      PLANS/RECOMMENDATIONS: 1.        Patient's CPAP settings were slightly altered, to a final 9 cm water, he will continue to use his current machine under new settings, and his preferred mask in large size, an AirFit N20. He was asked to continue to use I his current machine, settings at 6-12 cm water pressure with 3 EPR, has been 100% compliant, and residual AHI is 2.2/h.   He will be due for a new machine in in 2025. He still has an elevated Epworth score- while apnea is very well controlled .      02-05-2021- Originally seen in Consultation from Dr Philip Aspen for a transfer of sleep care- but is an active patient at [facility] in [place],  last sleep study 05-2018.  Machine is only 65 years old. Chief concern according to patient :  sleep was great on CPAP and has now changed, I have such vivid lucid  dreams that I can't rest and feel not refreshed. No sleep paralysis since childhood, Easily falling asleep and easy to go to dream stages.  He reports acting out dreams. Kicking, running, but not yelling in his sleep.    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    The patient had the first sleep study in the year 2005  with a result of an AHI ( Apnea Hypopnea index)  of OSA. Dr. Buel Ream office provided 2 copies the last sleep study is dated 06-27-2018 and describes the patient at 66 year old referred for reevaluation of obstructive sleep apnea.  History of depression, history of  brain tumor.  The patient had an AHI of 32.5 which is a significant degree of apnea.  In rem sleep or dream sleep his AHI rose to 70/h and in non-REM sleep only 22.4/h.  Apnea was of course more severe while sleeping in supine AHI of 45.8/h.  N[place]ir of oxygenation was 72%.  Total time of oxygen saturation under 90% was 39.5 minutes.  This would be considered prolonged.  Cardiac monitoring showed normal sinus rhythm at the time.  The patient was invited for a CPAP with titration the titration table shows pressures beginning at only 5 cmH2O during which the patient slept 173.2 minutes with 52% sleep efficiency and an AHI of 3.1 increasing the pressure step-by-step to final 8 cmH2O provided 100% sleep efficiency total sleep time 137 minutes and an AHI of 0.4 so this was revealed sleep  spoke.  I was able to review a recent download 30 out of 30 days with 100% compliance by days 97% compliance of CPAP use 5 hours average user time 7 hours 10 minutes at night.  CPAP is set to 8 cm water pressure this is not an autotitrator air leak test 2.8 L/min very low for a nasal CPAP user and excellent result.  Residual AHI is 1.4.  So we will nonrestorative sleep quality is unlikely to be related to apnea.  I reviewed the medication I think that the only medication that may interfere with the patient's screen activity could be bupropion or Wellbutrin which she takes that only 100 mg in the morning so I do not think there is a larger effect possible.     Sleep relevant medical history: Nocturia 2-4 , active dreams-  Parasomnia , brain tumor, operatively and radiation and chemo. [employer].    Family medical /sleep history: brother and nephew on CPAP with OSA,    Social history:  Patient is working as Clinical biochemist, Primary school teacher, and lives in a household with spouse  adult children.  Tobacco use quit 25 years ago- cigarettes,  ETOH use ; rare , Caffeine intake in form of Coffee( 2 cups ) Soda( 1 a day) Tea ( or 1 a day ) or energy drinks.    Sleep habits are as follows: The patient's dinner time is between 6.30 PM. The patient goes to bed at 11 PM but is often asleep watching TV in the den before going to bed-  and continues to sleep for 6-7 hours, wakes for many bathroom breaks, the first time at 2 AM.   The preferred sleep position is supine and sideways, not prone- , with the support of 2 pillows. Dreams are reportedly frequent/vivid. Nasal CPAP, N mask.  6.30-7  AM is the usual rise time. The patient wakes up spontaneously. He/ She reports not feeling refreshed or restored in AM, with symptoms such as dry mouth, and residual fatigue. Naps are taken frequently, lasting from 15 to 20 minutes and are more refreshing than nocturnal sleep.    Review of Systems: Out of a  complete 14 system review, the patient complains of only the following symptoms, and all other reviewed systems are negative.:  Fatigue, sleepiness , snoring, fragmented sleep.  PARASOMNIA with vivid dream enactment.    How likely are you to doze in the following situations: 0 = not likely, 1 = slight chance, 2 = moderate chance, 3 = high chance   Sitting and Reading? Watching Television? Sitting inactive in a public place (theater or meeting)? As a passenger in  a car for an hour without a break? Lying down in the afternoon when circumstances permit? Sitting and talking to someone? Sitting quietly after lunch without alcohol? In a car, while stopped for a few minutes in traffic?   Total = 13/ 24 points , elevated    FSS endorsed at 41/ 63 points. High   GERIATRIC DEPRESSION SCORE  2 / 15 now down from 9/ 15 points. He is now on depression medications.   Social History   Socioeconomic History   Marital status: Married    Spouse name: [name]   Number of children: 2   Years of education: Not on file   Highest education level: Master's degree (e.g., MA, MS, MEng, MEd, MSW, MBA)  Occupational History   Not on file  Tobacco Use   Smoking status: Former    Packs/day: 1.00    Years: 23.00    Total pack years: 23.00    Types: Cigarettes    Quit date: 04/26/1996    Years since quitting: 25.8   Smokeless tobacco: Never  Substance and Sexual Activity   Alcohol use: No   Drug use: No   Sexual activity: Not on file  Other Topics Concern   Not on file  Social History Narrative   Lives with wife   Right handed   Caffeine: 2 cups of coffee and 1 glass of tea/soda a day   Social Determinants of Health   Financial Resource Strain: Not on file  Food Insecurity: Not on file  Transportation Needs: Not on file  Physical Activity: Not on file  Stress: Not on file  Social Connections: Not on file    Family History  Problem Relation Age of Onset   Cancer Mother        Breast  Cancer   Cancer Brother 35   Heart attack Brother    Heart attack Brother    Birth defects Son    Colon cancer Neg Hx    Colon polyps Neg Hx    Esophageal cancer Neg Hx    Stomach cancer Neg Hx    Rectal cancer Neg Hx     Past Medical History:  Diagnosis Date   BPH (benign prostatic hyperplasia)    Cancer ([place]) 2006   Brain tumor, in remission after chemo radiation and surgery   Diabetes mellitus without complication ([place])    ED (erectile dysfunction)    Heart murmur    History of heart murmur   Hyperlipidemia    Hypertension    Sleep apnea    USES C-PAP    Past Surgical History:  Procedure Laterality Date   BRAIN SURGERY  2006   BRAIN TUMOR  - CANCER   COLONOSCOPY     CYSTOSCOPY N/A 07/29/2015   Procedure: FLEXIBLE CYSTOSCOPY;  Surgeon: Irine Seal, MD;  Location: [facility];  Service: Urology;  Laterality: N/A;   FOOT BONE EXCISION  1989   PENILE PROSTHESIS IMPLANT N/A 07/29/2015   Procedure: PENILE PROSTHESIS;  Surgeon: Irine Seal, MD;  Location: [facility];  Service: Urology;  Laterality: N/A;   right knee meniscus repair  2010   THYROID LOBECTOMY  05/12/2012   Procedure: THYROID LOBECTOMY;  Surgeon: Earnstine Regal, MD;  Location: [facility];  Service: General;  Laterality: Left;  left thyroid lobectomy     Current Outpatient Medications on File Prior to Visit  Medication Sig Dispense Refill   amLODipine (NORVASC) 5 MG tablet Take 5 mg by mouth daily.     aspirin  81 MG tablet Take 81 mg by mouth every morning.      buPROPion (WELLBUTRIN SR) 100 MG 12 hr tablet Take 100 mg by mouth every morning.     fish oil-omega-3 fatty acids 1000 MG capsule Take 2 g by mouth daily.      losartan-hydrochlorothiazide (HYZAAR) 100-12.5 MG tablet Take 1 tablet by mouth daily.     melatonin 3 MG TABS tablet Take 3 mg by mouth as needed.     pioglitazone (ACTOS) 15 MG tablet Take 15 mg by mouth daily.     simvastatin (ZOCOR) 20 MG tablet Take 20 mg by mouth daily.     Vitamin D, Ergocalciferol,  (DRISDOL) 50000 UNITS CAPS capsule Take 50,000 Units by mouth every Friday.      No current facility-administered medications on file prior to visit.    Allergies  Allergen Reactions   Gadolinium Derivatives Nausea And Vomiting    Per pt , always get sick with gado and never offered anti nausea meds.    Physical exam:  Today's Vitals   02/08/22 1319  BP: (!) 142/81  Pulse: 76  Weight: 248 lb 6.4 oz (112.7 kg)  Height: '5\' 11"'$  (1.803 m)   Body mass index is 34.64 kg/m.   Wt Readings from Last 3 Encounters:  02/08/22 248 lb 6.4 oz (112.7 kg)  02/05/21 251 lb (113.9 kg)  01/29/20 244 lb (110.7 kg)     Ht Readings from Last 3 Encounters:  02/08/22 '5\' 11"'$  (1.803 m)  02/05/21 '5\' 11"'$  (1.803 m)  01/29/20 '5\' 11"'$  (1.803 m)      General: The patient is awake, alert and appears not in acute distress. The patient is well groomed. Head: Normocephalic, atraumatic. Neck is supple. Mallampati ,3 plus   neck circumference:18.5 inches . Nasal airflow  patent.  Retrognathia is seen.  Dental status: / Cardiovascular:  Regular rate and cardiac rhythm by pulse,  without distended neck veins. Respiratory: Lungs are clear to auscultation.  Skin:  Without evidence of ankle edema, or rash. Trunk: The patient's posture is erect.   Neurologic exam : The patient is awake and alert, oriented to place and time.   Memory subjective described as intact.  Attention span & concentration ability appears normal.  Speech is fluent,  without  dysarthria, dysphonia or aphasia.  Mood and affect are appropriate.   Cranial nerves: no loss of smell or taste reported  Pupils are equal and briskly reactive to light. Funduscopic exam deferred. Marland Kitchen  Hearing was intact to soft voice.    Facial sensation intact to fine touch.  Facial motor strength is symmetric and tongue and uvula move midline. Bilateral ptosis,  Neck ROM : rotation, tilt and flexion extension were normal for age and shoulder shrug was  symmetrical.    Motor exam:  Symmetric bulk, tone and ROM.   Normal tone without cog- wheeling, symmetric grip strength .   Sensory:  Fine touch and vibration was normal. At knee and hands, reduced at ankles.   Proprioception tested in the upper extremities was normal.   Coordination: Rapid alternating movements in the fingers/hands were of normal speed.  The Finger-to-nose maneuver was intact without evidence of ataxia, dysmetria or tremor.   Deep tendon reflexes: in the  upper and lower extremities are symmetric and intact.  Babinski response was deferred.        After spending a total time of  15 minutes face to face and including additional time for physical and neurologic  examination, review of laboratory studies,  personal review of imaging studies, reports and results of other testing and review of referral information / records as far as provided in visit, I have established the following assessments:  1) the patient's sleep apnea is excellently treated and he is an active patient of another sleep clinic.  His AHI is 2.2 and his last sleep study was not even 3 years ago. Ambien made him hallucinate.   I doubt that apnea contributes in any way to Mr. Branscome less restorative sleep given the high resolution on current low pressure CPAP. His auto set is till in the same settings as last seen- his sleep study cont firmed apnea and its resolution on CPAP. He had a very high percentage of REM sleep.  2) depression is no longer clinically present, on antidepressive medication.   3)  brain tumor history- this may have influenced the REM sleep percentage by itself.     I would like to thank Donnajean Lopes, MD and Donnajean Lopes, [place],  [place] [address] for allowing me to meet with and to take care of this pleasant patient.   In short, Randall Lewis is presenting with parasomnia and vivid dreams, and some daytime sleepiness on well controlled apnea. .    NPRV once a year with Epworth score and compliance data .   CC: I will share my notes with PCP.  Electronically signed by: Larey Seat, MD 02/08/2022 1:42 PM  [facility] Board certified by The [employer] of Sleep Medicine and Diplomate of the [employer] of Sleep Medicine. Board certified In Neurology through the [place], Fellow of the [employer] of Neurology. Medical Director of [employer].

## 2022-06-22 DIAGNOSIS — E291 Testicular hypofunction: Secondary | ICD-10-CM | POA: Diagnosis not present

## 2022-06-22 DIAGNOSIS — I10 Essential (primary) hypertension: Secondary | ICD-10-CM | POA: Diagnosis not present

## 2022-06-22 DIAGNOSIS — F4321 Adjustment disorder with depressed mood: Secondary | ICD-10-CM | POA: Diagnosis not present

## 2022-06-22 DIAGNOSIS — G4733 Obstructive sleep apnea (adult) (pediatric): Secondary | ICD-10-CM | POA: Diagnosis not present

## 2022-06-22 DIAGNOSIS — Z85841 Personal history of malignant neoplasm of brain: Secondary | ICD-10-CM | POA: Diagnosis not present

## 2022-06-22 DIAGNOSIS — E1151 Type 2 diabetes mellitus with diabetic peripheral angiopathy without gangrene: Secondary | ICD-10-CM | POA: Diagnosis not present

## 2022-07-07 DIAGNOSIS — H2513 Age-related nuclear cataract, bilateral: Secondary | ICD-10-CM | POA: Diagnosis not present

## 2022-07-07 DIAGNOSIS — E119 Type 2 diabetes mellitus without complications: Secondary | ICD-10-CM | POA: Diagnosis not present

## 2022-07-07 DIAGNOSIS — H348322 Tributary (branch) retinal vein occlusion, left eye, stable: Secondary | ICD-10-CM | POA: Diagnosis not present

## 2022-07-07 DIAGNOSIS — H31092 Other chorioretinal scars, left eye: Secondary | ICD-10-CM | POA: Diagnosis not present

## 2022-10-19 ENCOUNTER — Ambulatory Visit (HOSPITAL_COMMUNITY)
Admission: RE | Admit: 2022-10-19 | Discharge: 2022-10-19 | Disposition: A | Discharge status: Home / Self Care | Payer: Medicare PPO | Source: Ambulatory Visit | Attending: Internal Medicine | Admitting: Internal Medicine

## 2022-10-19 ENCOUNTER — Other Ambulatory Visit (HOSPITAL_COMMUNITY): Payer: Self-pay | Admitting: Internal Medicine

## 2022-10-19 DIAGNOSIS — R600 Localized edema: Secondary | ICD-10-CM

## 2022-10-19 NOTE — Progress Notes (Signed)
Right lower extremity venous duplex has been completed. Preliminary results can be found in CV Proc through chart review.  Results were faxed to Dr. Eloise Harman.  10/19/22 1:02 PM Olen Cordial RVT

## 2022-12-10 DIAGNOSIS — G4733 Obstructive sleep apnea (adult) (pediatric): Secondary | ICD-10-CM | POA: Diagnosis not present

## 2023-02-07 ENCOUNTER — Ambulatory Visit (INDEPENDENT_AMBULATORY_CARE_PROVIDER_SITE_OTHER): Payer: Medicare PPO | Admitting: Adult Health

## 2023-02-07 ENCOUNTER — Encounter: Payer: Self-pay | Admitting: Adult Health

## 2023-02-07 VITALS — BP 138/84 | HR 62 | Ht 71.0 in | Wt 245.6 lb

## 2023-02-07 DIAGNOSIS — G4733 Obstructive sleep apnea (adult) (pediatric): Secondary | ICD-10-CM | POA: Diagnosis not present

## 2023-02-07 NOTE — Patient Instructions (Addendum)
Your Plan:  Continue nightly use of CPAP with ensuring greater than 4 hours per night  Continue routine follow-up with DME company for any needed supplies or CPAP related concerns     Follow-up in 1 year or call earlier if needed      Thank you for coming to see [place] at [facility]. I hope we have been able to provide you high quality care today.  You may receive a patient satisfaction survey over the next few weeks. We would appreciate your feedback and comments so that we may continue to improve ourselves and the health of our patients.

## 2023-02-07 NOTE — Progress Notes (Signed)
[facility] [address] [place]. [place] [address] [phone]       OFFICE FOLLOW UP NOTE  [name] Date of Birth:  14-Mar-1956 Medical Record Number:  [id number]    Primary neurologist: Dr. Vickey Huger Reason for visit: CPAP follow-up    SUBJECTIVE:   CHIEF COMPLAINT:  Chief Complaint  Patient presents with   Follow-up    Patient in room #8 and alone. Patient states he been using his CPAP every night and doing well, no new concerns.   Follow-up visit:  Prior visit: 02/08/2022 with Dr. Vickey Huger  Brief HPI:   Randall Lewis is a 67 y.o. male who was initially seen by Dr. Vickey Huger on 02/05/2021 for transfer of care regarding known sleep apnea on CPAP therapy, initially diagnosed 2005. PMH signet frequent for stage III glioblastoma (2006), DM, HTN, HLD and OSA.  Prior sleep study 2020, sleep initially improved on CPAP but c/o change in sleep including vivid lucid dreams, not feeling refreshed and acting out dreams.  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  Repeat sleep study with CPAP titration 02/2021 with slight alteration to settings to set pressure of 9, no evidence of parasomnia activity, unusual sleep architecture with high REM sleep proportion and no apnea and recommend REM sleep.   At prior visit, compliance report showed excellent treated apnea.  Low suspicion apnea contributing to less restorative sleep given high resolution on current blood pressure of CPAP, noted hx of brain tumor which may influence REM sleep percentage.   Interval history:  Compliance report shows excellent usage and optimal residual AHI. Current use of nasal mask, tolerating well.  Routinely followed by [facility] in [place], [place] but is requesting a transfer of care to a DME in [place]. ESS 12/24.  No questions or concerns today.         ROS:   14 system review of systems performed and negative with exception of those listed in HPI  PMH:   Past Medical History:  Diagnosis Date   BPH (benign prostatic hyperplasia)    Cancer (HCC) 2006   Brain tumor, in remission after chemo radiation and surgery   Diabetes mellitus without complication (HCC)    ED (erectile dysfunction)    Heart murmur    History of heart murmur   Hyperlipidemia    Hypertension    Sleep apnea    USES C-PAP    PSH:  Past Surgical History:  Procedure Laterality Date   BRAIN SURGERY  2006   BRAIN TUMOR  - CANCER   COLONOSCOPY     CYSTOSCOPY N/A 07/29/2015   Procedure: FLEXIBLE CYSTOSCOPY;  Surgeon: Bjorn Pippin, MD;  Location: [facility];  Service: Urology;  Laterality: N/A;   FOOT BONE EXCISION  1989   PENILE PROSTHESIS IMPLANT N/A 07/29/2015   Procedure: PENILE PROSTHESIS;  Surgeon: Bjorn Pippin, MD;  Location: [facility];  Service: Urology;  Laterality: N/A;   right knee meniscus repair  2010   THYROID LOBECTOMY  05/12/2012   Procedure: THYROID LOBECTOMY;  Surgeon: Velora Heckler, MD;  Location: [facility];  Service: General;  Laterality: Left;  left thyroid lobectomy    Social History:  Social History   Socioeconomic History   Marital status: Married    Spouse name: Randall Lewis   Number of children: 2   Years of education: Not on file   Highest education level: Master's degree (e.g., MA, MS, MEng, MEd, MSW, MBA)  Occupational History   Not on  file  Tobacco Use   Smoking status: Former    Current packs/day: 0.00    Average packs/day: 1 pack/day for 23.0 years (23.0 ttl pk-yrs)    Types: Cigarettes    Start date: 04/26/1973    Quit date: 04/26/1996    Years since quitting: 26.8   Smokeless tobacco: Never  Substance and Sexual Activity   Alcohol use: No   Drug use: No   Sexual activity: Not on file  Other Topics Concern   Not on file  Social History Narrative   Lives with wife   Right handed   Caffeine: 2 cups of coffee and 1 glass of tea/soda a day   Social Determinants of Health   Financial Resource Strain: Not on file  Food Insecurity: Not on  file  Transportation Needs: Not on file  Physical Activity: Not on file  Stress: Not on file  Social Connections: Not on file  Intimate Partner Violence: Not on file    Family History:  Family History  Problem Relation Age of Onset   Cancer Mother        Breast Cancer   Cancer Brother 80   Heart attack Brother    Heart attack Brother    Birth defects Son    Colon cancer Neg Hx    Colon polyps Neg Hx    Esophageal cancer Neg Hx    Stomach cancer Neg Hx    Rectal cancer Neg Hx     Medications:   Current Outpatient Medications on File Prior to Visit  Medication Sig Dispense Refill   amLODipine (NORVASC) 5 MG tablet Take 5 mg by mouth daily.     aspirin 81 MG tablet Take 81 mg by mouth every morning.      buPROPion (WELLBUTRIN SR) 100 MG 12 hr tablet Take 100 mg by mouth every morning.     fish oil-omega-3 fatty acids 1000 MG capsule Take 2 g by mouth daily.      losartan-hydrochlorothiazide (HYZAAR) 100-12.5 MG tablet Take 1 tablet by mouth daily.     melatonin 3 MG TABS tablet Take 3 mg by mouth as needed.     metFORMIN (GLUCOPHAGE-XR) 500 MG 24 hr tablet Take 500 mg by mouth 2 (two) times daily.     pioglitazone (ACTOS) 15 MG tablet Take 15 mg by mouth daily.     simvastatin (ZOCOR) 20 MG tablet Take 20 mg by mouth daily.     Vitamin D, Ergocalciferol, (DRISDOL) 50000 UNITS CAPS capsule Take 50,000 Units by mouth every Friday.      No current facility-administered medications on file prior to visit.    Allergies:   Allergies  Allergen Reactions   Sildenafil Other (See Comments)   Gadolinium Derivatives Nausea And Vomiting    Per pt , always get sick with gado and never offered anti nausea meds.      OBJECTIVE:  Physical Exam  Vitals:   02/07/23 1356  BP: 138/84  Pulse: 62  Weight: 245 lb 9.6 oz (111.4 kg)  Height: 5\' 11"  (1.803 m)   Body mass index is 34.25 kg/m. No results found.   General: well developed, well nourished, very pleasant middle-age  male, seated, in no evident distress Head: head normocephalic and atraumatic.   Neck: supple with no carotid or supraclavicular bruits Cardiovascular: regular rate and rhythm, no murmurs Musculoskeletal: no deformity Skin:  no rash/petichiae Vascular:  Normal pulses all extremities   Neurologic Exam Mental Status: Awake and fully alert. Oriented to  place and time. Recent and remote memory intact. Attention span, concentration and fund of knowledge appropriate. Mood and affect appropriate.  Cranial Nerves: Pupils equal, briskly reactive to light. Extraocular movements full without nystagmus. Visual fields full to confrontation. Hearing intact. Facial sensation intact. Face, tongue, palate moves normally and symmetrically.  Motor: Normal bulk and tone. Normal strength in all tested extremity muscles Sensory.: intact to touch , pinprick , position and vibratory sensation.  Coordination: Rapid alternating movements normal in all extremities. Finger-to-nose and heel-to-shin performed accurately bilaterally. Gait and Station: Arises from chair without difficulty. Stance is normal. Gait demonstrates normal stride length and balance without use of AD. Reflexes: 1+ and symmetric. Toes downgoing.         ASSESSMENT/PLAN: SAMRATH PELT is a 68 y.o. year old male    OSA on CPAP : Compliance report shows satisfactory usage with optimal residual AHI.  Continue current pressure setting. Due for new machine 2025, will plan on repeat HST at that time in order to obtain new CPAP machine. Will request transfer of care to [facility] in [place] per patient request.  Discussed continued nightly usage with ensuring greater than 4 hours nightly for optimal benefit and per insurance purposes.  Continue to follow with DME company for any needed supplies or CPAP related concerns     Follow up in 1 year or call earlier if needed   CC:  PCP: Garlan Fillers, MD    I spent 25 minutes of  face-to-face and non-face-to-face time with patient.  This included previsit chart review, lab review, study review, order entry, electronic health record documentation, patient education regarding diagnosis of sleep apnea with review and discussion of compliance report and answered all other questions to patient's satisfaction   Ihor Austin, [facility]  [facility] [address] [place], [place] [address]  Phone [phone] Fax [phone] Note: This document was prepared with digital dictation and possible smart phrase technology. Any transcriptional errors that result from this process are unintentional.

## 2023-02-08 ENCOUNTER — Telehealth: Payer: Self-pay

## 2023-04-02 ENCOUNTER — Emergency Department (HOSPITAL_COMMUNITY)
Admission: EM | Admit: 2023-04-02 | Discharge: 2023-04-03 | Disposition: A | Discharge status: Home / Self Care | Payer: Medicare PPO | Attending: Emergency Medicine | Admitting: Emergency Medicine

## 2023-04-02 ENCOUNTER — Emergency Department (HOSPITAL_COMMUNITY): Payer: Medicare PPO

## 2023-04-02 ENCOUNTER — Encounter (HOSPITAL_COMMUNITY): Payer: Self-pay | Admitting: Emergency Medicine

## 2023-04-02 ENCOUNTER — Other Ambulatory Visit: Payer: Self-pay

## 2023-04-02 DIAGNOSIS — R519 Headache, unspecified: Secondary | ICD-10-CM | POA: Diagnosis present

## 2023-04-02 DIAGNOSIS — Z7984 Long term (current) use of oral hypoglycemic drugs: Secondary | ICD-10-CM | POA: Diagnosis not present

## 2023-04-02 DIAGNOSIS — G9389 Other specified disorders of brain: Secondary | ICD-10-CM | POA: Diagnosis not present

## 2023-04-02 DIAGNOSIS — Z7982 Long term (current) use of aspirin: Secondary | ICD-10-CM | POA: Insufficient documentation

## 2023-04-02 DIAGNOSIS — I10 Essential (primary) hypertension: Secondary | ICD-10-CM | POA: Diagnosis not present

## 2023-04-02 DIAGNOSIS — E119 Type 2 diabetes mellitus without complications: Secondary | ICD-10-CM | POA: Diagnosis not present

## 2023-04-02 DIAGNOSIS — Z79899 Other long term (current) drug therapy: Secondary | ICD-10-CM | POA: Insufficient documentation

## 2023-04-02 NOTE — ED Triage Notes (Signed)
Pt arrived via POV with daughter presenting with hypertension, bp at home 213/97, pt reports dizziness, Pt did fall today. Denies hitting head and LOC. Denies pain or injury from fall. Denies chest pain. Had bp meds at 1800. Pt is A&Ox4.

## 2023-04-02 NOTE — ED Provider Triage Note (Signed)
Emergency Medicine Provider Triage Evaluation Note  Randall Lewis , a 67 y.o. male  was evaluated in triage.  Pt complains of hypertension with readings at home as high as 213/97 with associated lightheadedness, intermittent headache.  Patient does report a fall earlier today but denies hitting his head and denies loss of consciousness.  He denies pain or injury from the fall, denies chest pain, shortness of breath.  Patient took his blood pressure medication this evening at 6:00.  Review of Systems  Positive: Negative:   Physical Exam  BP (!) 180/84 (BP Location: Left Arm)   Pulse 63   Temp 98 F (36.7 C) (Oral)   Resp 15   Ht 5\' 11"  (1.803 m)   Wt 112 kg   SpO2 95%   BMI 34.45 kg/m  Gen:   Awake, no distress   Resp:  Normal effort  MSK:   Moves extremities without difficulty  Other:    Medical Decision Making  Medically screening exam initiated at 11:52 PM.  Appropriate orders placed.  Maye Hides was informed that the remainder of the evaluation will be completed by another provider, this initial triage assessment does not replace that evaluation, and the importance of remaining in the ED until their evaluation is complete.     Darrick Grinder, PA-C 04/02/23 2353

## 2023-04-03 ENCOUNTER — Telehealth (HOSPITAL_COMMUNITY): Payer: Self-pay | Admitting: Emergency Medicine

## 2023-04-03 DIAGNOSIS — G9389 Other specified disorders of brain: Secondary | ICD-10-CM | POA: Diagnosis not present

## 2023-04-03 LAB — CBC
HCT: 49.7 % (ref 39.0–52.0)
Hemoglobin: 16.6 g/dL (ref 13.0–17.0)
MCH: 26.8 pg (ref 26.0–34.0)
MCHC: 33.4 g/dL (ref 30.0–36.0)
MCV: 80.2 fL (ref 80.0–100.0)
Platelets: 219 10*3/uL (ref 150–400)
RBC: 6.2 MIL/uL — ABNORMAL HIGH (ref 4.22–5.81)
RDW: 16.2 % — ABNORMAL HIGH (ref 11.5–15.5)
WBC: 6.5 10*3/uL (ref 4.0–10.5)
nRBC: 0 % (ref 0.0–0.2)

## 2023-04-03 LAB — BASIC METABOLIC PANEL
Anion gap: 7 (ref 5–15)
BUN: 19 mg/dL (ref 8–23)
CO2: 22 mmol/L (ref 22–32)
Calcium: 8.8 mg/dL — ABNORMAL LOW (ref 8.9–10.3)
Chloride: 104 mmol/L (ref 98–111)
Creatinine, Ser: 0.92 mg/dL (ref 0.61–1.24)
GFR, Estimated: >60 mL/min (ref >60)
Glucose, Bld: 150 mg/dL — ABNORMAL HIGH (ref 70–99)
Potassium: 3.5 mmol/L (ref 3.5–5.1)
Sodium: 133 mmol/L — ABNORMAL LOW (ref 135–145)

## 2023-04-03 LAB — URINALYSIS, ROUTINE W REFLEX MICROSCOPIC
Bacteria, UA: NONE SEEN
Bilirubin Urine: NEGATIVE
Glucose, UA: >=500 mg/dL — AB
Ketones, ur: NEGATIVE mg/dL
Leukocytes,Ua: NEGATIVE
Nitrite: NEGATIVE
Protein, ur: NEGATIVE mg/dL
Specific Gravity, Urine: 1.017 (ref 1.005–1.030)
pH: 6 (ref 5.0–8.0)

## 2023-04-03 LAB — TROPONIN I (HIGH SENSITIVITY): Troponin I (High Sensitivity): 7 ng/L (ref <18)

## 2023-04-03 MED ORDER — DEXAMETHASONE 2 MG PO TABS: 2.0000 mg | ORAL_TABLET | Freq: Two times a day (BID) | ORAL | 0 refills | Status: AC

## 2023-04-03 MED ORDER — DEXAMETHASONE 2 MG PO TABS
2.0000 mg | ORAL_TABLET | Freq: Once | ORAL | Status: AC
  Administered 2023-04-03: 2 mg via ORAL
  Filled 2023-04-03: qty 1

## 2023-04-03 MED ORDER — DEXAMETHASONE 2 MG PO TABS: 2.0000 mg | ORAL_TABLET | Freq: Two times a day (BID) | ORAL | 0 refills | Status: DC

## 2023-04-03 NOTE — ED Provider Notes (Signed)
[place] EMERGENCY DEPARTMENT AT [facility] Provider Note   CSN: [id number] Arrival date & time: 04/02/23  2250     History  Chief Complaint  Patient presents with   Hypertension   Dizziness    Randall Lewis is a 67 y.o. male.  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  Patient states that since Tuesday he has had lightheadedness when standing.  He states that he had a mild headache which resolved with [employer] on Tuesday and a repeat headache earlier this afternoon.  He did fall earlier in the day, unclear as to why the patient fell.  He denies hitting his head or losing consciousness during the fall.  Patient is also concerned because his blood pressure was high when checked at home earlier today, reported to be 213/97.  Upon arrival blood pressure was 180/84.  He denies chest pain, abdominal pain, shortness of breath, nausea, vomiting, urinary symptoms.   Hypertension  Dizziness      Home Medications Prior to Admission medications   Medication Sig Start Date End Date Taking? Authorizing Provider  amLODipine (NORVASC) 5 MG tablet Take 5 mg by mouth daily.   Yes [provider]  dexamethasone (DECADRON) 2 MG tablet Take 1 tablet (2 mg total) by mouth 2 (two) times daily with a meal for 15 days. 04/03/23 04/18/23 Yes Darrick Grinder, PA-C  losartan-hydrochlorothiazide (HYZAAR) 100-12.5 MG tablet Take 1 tablet by mouth daily.   Yes [provider]  aspirin 81 MG tablet Take 81 mg by mouth every morning.     [provider]  buPROPion (WELLBUTRIN SR) 100 MG 12 hr tablet Take 100 mg by mouth every morning. 11/29/19   [provider]  fish oil-omega-3 fatty acids 1000 MG capsule Take 2 g by mouth daily.      [provider]  melatonin 3 MG TABS tablet Take 3 mg by mouth as needed.    [provider]  metFORMIN (GLUCOPHAGE-XR) 500 MG 24 hr tablet Take 500 mg by mouth 2 (two) times daily. 01/17/23   [provider]  pioglitazone (ACTOS) 15 MG tablet Take 15 mg by mouth daily. 11/26/19   [provider]  simvastatin (ZOCOR) 20 MG tablet Take 20 mg by mouth daily. 05/16/15   [provider]  Vitamin D, Ergocalciferol, (DRISDOL) 50000 UNITS CAPS capsule Take 50,000 Units by mouth every Friday.  04/19/15   [provider]      Allergies    Sildenafil and Gadolinium derivatives    Review of Systems   Review of Systems  Neurological:  Positive for dizziness.    Physical Exam Updated Vital Signs BP (!) 169/95   Pulse (!) 56   Temp 98.2 F (36.8 C) (Oral)   Resp 15   Ht 5\' 11"  (1.803 m)   Wt 112 kg   SpO2 94%   BMI 34.45 kg/m  Physical Exam Vitals and nursing note reviewed.  Constitutional:      General: He is not in acute distress.    Appearance: He is well-developed.  HENT:     Head: Normocephalic and atraumatic.  Eyes:     Conjunctiva/sclera: Conjunctivae normal.     Pupils: Pupils are equal, round, and reactive to light.  Cardiovascular:     Rate and Rhythm: Normal rate and regular  rhythm.  Pulmonary:     Effort: Pulmonary effort is normal. No respiratory distress.     Breath sounds: Normal breath sounds.  Abdominal:     Palpations: Abdomen is soft.     Tenderness: There is no abdominal tenderness.  Musculoskeletal:        General: No swelling.     Cervical back: Neck supple.  Skin:    General: Skin is warm and dry.     Capillary Refill: Capillary refill takes less than 2 seconds.  Neurological:     General: No focal deficit present.     Mental Status: He is alert and oriented to person, place, and time.     Cranial Nerves: No cranial nerve deficit.     Sensory: No sensory deficit.     Motor: No weakness.   Psychiatric:        Mood and Affect: Mood normal.     ED Results / Procedures / Treatments   Labs (all labs ordered are listed, but only abnormal results are displayed) Labs Reviewed  BASIC METABOLIC PANEL - Abnormal; Notable for the following components:      Result Value   Sodium 133 (*)    Glucose, Bld 150 (*)    Calcium 8.8 (*)    All other components within normal limits  CBC - Abnormal; Notable for the following components:   RBC 6.20 (*)    RDW 16.2 (*)    All other components within normal limits  URINALYSIS, ROUTINE W REFLEX MICROSCOPIC  TROPONIN I (HIGH SENSITIVITY)    EKG EKG Interpretation Date/Time:  Saturday April 02 2023 23:07:05 EDT Ventricular Rate:  65 PR Interval:  191 QRS Duration:  82 QT Interval:  430 QTC Calculation: 448 R Axis:   -9  Text Interpretation: Sinus rhythm Probable left atrial enlargement Probable left ventricular hypertrophy Nonspecific T abnormalities, inferior leads Confirmed by Tilden Fossa [phone]) on 04/02/2023 11:12:15 PM  Radiology CT Head Wo Contrast  Result Date: 04/03/2023 CLINICAL DATA:  Initial evaluation for acute headache, dizziness. EXAM: CT HEAD WITHOUT CONTRAST TECHNIQUE: Contiguous axial images were obtained from the base of the skull through the vertex without intravenous contrast. RADIATION DOSE REDUCTION: This exam was performed according to the departmental dose-optimization program which includes automated exposure control, adjustment of the mA and/or kV according to patient size and/or use of iterative reconstruction technique. COMPARISON:  Prior MRI from 05/20/2021. FINDINGS: Brain: Sequelae of prior left craniotomy with chronic encephalomalacia within the underlying left frontal lobe, presumably related to prior tumor resection. 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 Few scattered internal  punctate hyperdensities noted, which could potentially reflect superimposed blood products. Extensive vasogenic edema within the adjacent right cerebral hemisphere with partial effacement of the right lateral ventricle. Associated 4 mm of right-to-left shift. No hydrocephalus or trapping. Basilar cisterns remain patent. Note made of a 9 mm hyperdense focus at the left globus pallidus, favored to reflect dystrophic calcification/mineralization. No other acute intracranial hemorrhage. No other acute large vessel territory infarct. No extra-axial fluid collection. Vascular: No abnormal hyperdense vessel. Skull: Post craniotomy changes on the left. Calvarium otherwise intact. No acute scalp soft tissue abnormality. Sinuses/Orbits: Globes and orbital soft tissues within normal limits. Paranasal sinuses are largely clear. No mastoid effusion. Other: None. IMPRESSION: 1. 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 Few  scattered internal punctate hyperdensities noted, which could potentially reflect superimposed blood products. Extensive vasogenic edema within the adjacent right cerebral hemisphere with associated 4 mm of right-to-left shift. No hydrocephalus or trapping. 2. Sequelae of prior left craniotomy with chronic encephalomalacia within the underlying left frontal lobe. 3. 9 mm hyperdense focus at the left globus pallidus, favored to reflect dystrophic calcification/mineralization. Critical Value/emergent results were called by telephone at the time of interpretation on 04/03/2023 at 12:27 am to provider [facility] , who verbally acknowledged these results. Electronically Signed   By: Rise Mu M.D.   On: 04/03/2023 00:29    Procedures Procedures    Medications Ordered in ED Medications  dexamethasone (DECADRON) tablet 2 mg (has no administration in time range)    ED Course/ Medical Decision Making/ A&P                                  Medical Decision Making Amount and/or Complexity of Data Reviewed Labs: ordered. Radiology: ordered.   This patient presents to the ED for concern of hypertension, lightheadedness, headache, this involves an extensive number of treatment options, and is a complaint that carries with it a high risk of complications and morbidity.  The differential diagnosis includes intracranial abnormality, hypertensive emergency, dysrhythmia, others   Co morbidities that complicate the patient evaluation  History of cancer, hypertension   Additional history obtained:  Additional history obtained from family at bedside External records from outside source obtained and reviewed including oncology notes showing previous MRI with no progression of tumor in 2022   Lab Tests:  I Ordered, and personally interpreted labs.  The pertinent results include: Grossly unremarkable BMP, CBC, initial troponin 7.  UA in progress   Imaging Studies ordered:  I ordered imaging studies including CT head without contrast I independently visualized and interpreted imaging which showed  1. 4.3 x 5.9 x 4.3 cm heterogeneous mass positioned at the right  parieto-occipital region, concerning for recurrent CNS neoplasm. Few  scattered internal punctate hyperdensities noted, which could  potentially reflect superimposed blood products. Extensive vasogenic  edema within the adjacent right cerebral hemisphere with associated  4 mm of right-to-left shift. No hydrocephalus or trapping.  2. Sequelae of prior left craniotomy with chronic encephalomalacia  within the underlying left frontal lobe.  3. 9 mm hyperdense focus at the left globus pallidus, favored to  reflect dystrophic calcification/mineralization.   I agree with the radiologist interpretation   Cardiac Monitoring: / EKG:  The patient was maintained on a cardiac monitor.  I personally viewed and interpreted the cardiac monitored  which showed an underlying rhythm of: Sinus rhythm   Consultations Obtained:  I requested consultation with the Alli Cosantino, PA-C,  and discussed lab and imaging findings as well as pertinent plan - they recommend: Decadron 2 mg twice daily, follow-up with Dr. Maurice Small   Problem List / ED Course / Critical interventions / Medication management   I ordered medication including Decadron  Reevaluation of the patient after these medicines showed that the patient stayed the same I have reviewed the patients home medicines and have made adjustments as needed   Test / Admission - Considered:  Patient with what appears to be recurrent CNS mass/neoplasm.  Patient will need further evaluation and management by neurosurgery.  On-call neurosurgery provider feels that patient may discharge home and follow-up as an outpatient since patient is able to safely ambulate.  Patient was  witnessed ambulating without any difficulty to the restroom.  Decadron prescribed a recommendation of neurosurgery.  Follow-up information and return precautions provided.  No indication for further emergent workup or admission.         Final Clinical Impression(s) / ED Diagnoses Final diagnoses:  Brain mass  Hypertension, unspecified type    Rx / DC Orders ED Discharge Orders          Ordered    dexamethasone (DECADRON) 2 MG tablet  2 times daily with meals        04/03/23 0136              Darrick Grinder, PA-C 04/03/23 0140    Tilden Fossa, MD 04/03/23 0422

## 2023-04-03 NOTE — Discharge Instructions (Signed)
Your imaging today was concerning for a heterogenous mass noted in the brain.  It is important that you call neurosurgery on Monday morning to schedule an appointment for further evaluation and management.  At the recommendations of neurosurgery I have prescribed a steroid tablet to be taken twice daily.  If you develop any life-threatening symptoms such as fainting, inability to ambulate safely, or shortness of breath please return to the emergency department immediately.

## 2023-04-03 NOTE — Telephone Encounter (Cosign Needed)
Staff reportedly received call from patient stating that he needed his pharmacy changed for his decadron. Same has been changed per patients recommendation.

## 2023-04-04 ENCOUNTER — Other Ambulatory Visit: Payer: Self-pay | Admitting: Radiation Therapy

## 2023-04-05 DIAGNOSIS — D496 Neoplasm of unspecified behavior of brain: Secondary | ICD-10-CM | POA: Diagnosis not present

## 2023-04-05 DIAGNOSIS — Z91041 Radiographic dye allergy status: Secondary | ICD-10-CM | POA: Diagnosis not present

## 2023-04-06 DIAGNOSIS — Z85841 Personal history of malignant neoplasm of brain: Secondary | ICD-10-CM | POA: Diagnosis not present

## 2023-04-06 DIAGNOSIS — G9389 Other specified disorders of brain: Secondary | ICD-10-CM | POA: Diagnosis not present

## 2023-04-06 DIAGNOSIS — G936 Cerebral edema: Secondary | ICD-10-CM | POA: Diagnosis not present

## 2023-04-06 DIAGNOSIS — D496 Neoplasm of unspecified behavior of brain: Secondary | ICD-10-CM | POA: Diagnosis not present

## 2023-04-07 ENCOUNTER — Encounter: Payer: Self-pay | Admitting: Genetic Counselor

## 2023-04-11 ENCOUNTER — Inpatient Hospital Stay: Payer: Medicare PPO | Attending: Internal Medicine

## 2023-04-11 DIAGNOSIS — E782 Mixed hyperlipidemia: Secondary | ICD-10-CM | POA: Insufficient documentation

## 2023-04-11 DIAGNOSIS — N401 Enlarged prostate with lower urinary tract symptoms: Secondary | ICD-10-CM | POA: Insufficient documentation

## 2023-04-12 DIAGNOSIS — E119 Type 2 diabetes mellitus without complications: Secondary | ICD-10-CM | POA: Diagnosis not present

## 2023-04-12 DIAGNOSIS — Z87891 Personal history of nicotine dependence: Secondary | ICD-10-CM | POA: Diagnosis not present

## 2023-04-12 DIAGNOSIS — Z01818 Encounter for other preprocedural examination: Secondary | ICD-10-CM | POA: Diagnosis not present

## 2023-04-12 DIAGNOSIS — D496 Neoplasm of unspecified behavior of brain: Secondary | ICD-10-CM | POA: Diagnosis not present

## 2023-04-12 DIAGNOSIS — E782 Mixed hyperlipidemia: Secondary | ICD-10-CM | POA: Diagnosis not present

## 2023-04-12 DIAGNOSIS — G4733 Obstructive sleep apnea (adult) (pediatric): Secondary | ICD-10-CM | POA: Diagnosis not present

## 2023-04-12 DIAGNOSIS — I10 Essential (primary) hypertension: Secondary | ICD-10-CM | POA: Diagnosis not present

## 2023-04-12 DIAGNOSIS — H5340 Unspecified visual field defects: Secondary | ICD-10-CM | POA: Diagnosis not present

## 2023-04-12 DIAGNOSIS — N401 Enlarged prostate with lower urinary tract symptoms: Secondary | ICD-10-CM | POA: Diagnosis not present

## 2023-04-12 DIAGNOSIS — C711 Malignant neoplasm of frontal lobe: Secondary | ICD-10-CM | POA: Diagnosis not present

## 2023-04-18 DIAGNOSIS — N401 Enlarged prostate with lower urinary tract symptoms: Secondary | ICD-10-CM | POA: Diagnosis not present

## 2023-04-18 DIAGNOSIS — I10 Essential (primary) hypertension: Secondary | ICD-10-CM | POA: Diagnosis not present

## 2023-04-18 DIAGNOSIS — D496 Neoplasm of unspecified behavior of brain: Secondary | ICD-10-CM | POA: Diagnosis not present

## 2023-04-18 DIAGNOSIS — Z85841 Personal history of malignant neoplasm of brain: Secondary | ICD-10-CM | POA: Diagnosis not present

## 2023-04-18 DIAGNOSIS — Z483 Aftercare following surgery for neoplasm: Secondary | ICD-10-CM | POA: Diagnosis not present

## 2023-04-18 DIAGNOSIS — Z923 Personal history of irradiation: Secondary | ICD-10-CM | POA: Diagnosis not present

## 2023-04-18 DIAGNOSIS — G4733 Obstructive sleep apnea (adult) (pediatric): Secondary | ICD-10-CM | POA: Diagnosis not present

## 2023-04-18 DIAGNOSIS — E119 Type 2 diabetes mellitus without complications: Secondary | ICD-10-CM | POA: Diagnosis not present

## 2023-04-18 DIAGNOSIS — E785 Hyperlipidemia, unspecified: Secondary | ICD-10-CM | POA: Diagnosis not present

## 2023-04-18 DIAGNOSIS — C711 Malignant neoplasm of frontal lobe: Secondary | ICD-10-CM | POA: Diagnosis not present

## 2023-04-18 DIAGNOSIS — F329 Major depressive disorder, single episode, unspecified: Secondary | ICD-10-CM | POA: Diagnosis not present

## 2023-04-18 DIAGNOSIS — Z9221 Personal history of antineoplastic chemotherapy: Secondary | ICD-10-CM | POA: Diagnosis not present

## 2023-04-18 DIAGNOSIS — C718 Malignant neoplasm of overlapping sites of brain: Secondary | ICD-10-CM | POA: Diagnosis not present

## 2023-04-18 DIAGNOSIS — E782 Mixed hyperlipidemia: Secondary | ICD-10-CM | POA: Diagnosis not present

## 2023-04-18 DIAGNOSIS — C712 Malignant neoplasm of temporal lobe: Secondary | ICD-10-CM | POA: Insufficient documentation

## 2023-04-19 ENCOUNTER — Telehealth: Payer: Self-pay

## 2023-04-19 NOTE — Telephone Encounter (Signed)
Transition Care Management Unsuccessful Follow-up Telephone Call  Date of discharge and from where:  04/03/2023 [facility]  Attempts:  1st Attempt  Reason for unsuccessful TCM follow-up call:  No answer/busy  Maurizio Geno [facility]  [facility], [facility] Resource Care Guide Direct Dial: [phone]  Website: [email or web address]

## 2023-04-19 NOTE — Telephone Encounter (Signed)
Transition Care Management Unsuccessful Follow-up Telephone Call  Date of discharge and from where:  04/03/2023 [facility]  Attempts:  2nd Attempt  Reason for unsuccessful TCM follow-up call:  Left voice message  Kymberlie Brazeau [facility]  [facility], [facility] Resource Care Guide Direct Dial: [phone]  Website: [email or web address]

## 2023-04-20 DIAGNOSIS — N3000 Acute cystitis without hematuria: Secondary | ICD-10-CM | POA: Diagnosis not present

## 2023-04-22 ENCOUNTER — Encounter (HOSPITAL_COMMUNITY): Payer: Self-pay

## 2023-04-22 ENCOUNTER — Emergency Department (HOSPITAL_COMMUNITY)
Admission: EM | Admit: 2023-04-22 | Discharge: 2023-04-23 | Disposition: A | Discharge status: Home / Self Care | Payer: Medicare PPO | Attending: Emergency Medicine | Admitting: Emergency Medicine

## 2023-04-22 DIAGNOSIS — I10 Essential (primary) hypertension: Secondary | ICD-10-CM | POA: Diagnosis not present

## 2023-04-22 DIAGNOSIS — C719 Malignant neoplasm of brain, unspecified: Secondary | ICD-10-CM | POA: Diagnosis not present

## 2023-04-22 DIAGNOSIS — F4321 Adjustment disorder with depressed mood: Secondary | ICD-10-CM | POA: Diagnosis not present

## 2023-04-22 DIAGNOSIS — Z85841 Personal history of malignant neoplasm of brain: Secondary | ICD-10-CM | POA: Diagnosis not present

## 2023-04-22 DIAGNOSIS — G4733 Obstructive sleep apnea (adult) (pediatric): Secondary | ICD-10-CM | POA: Diagnosis not present

## 2023-04-22 DIAGNOSIS — E1151 Type 2 diabetes mellitus with diabetic peripheral angiopathy without gangrene: Secondary | ICD-10-CM | POA: Diagnosis not present

## 2023-04-22 DIAGNOSIS — R339 Retention of urine, unspecified: Secondary | ICD-10-CM | POA: Diagnosis not present

## 2023-04-22 DIAGNOSIS — Z7982 Long term (current) use of aspirin: Secondary | ICD-10-CM | POA: Diagnosis not present

## 2023-04-22 DIAGNOSIS — N390 Urinary tract infection, site not specified: Secondary | ICD-10-CM | POA: Diagnosis not present

## 2023-04-22 DIAGNOSIS — Z79899 Other long term (current) drug therapy: Secondary | ICD-10-CM | POA: Insufficient documentation

## 2023-04-22 LAB — I-STAT CHEM 8, ED
BUN: 22 mg/dL (ref 8–23)
Calcium, Ion: 1.02 mmol/L — ABNORMAL LOW (ref 1.15–1.40)
Chloride: 95 mmol/L — ABNORMAL LOW (ref 98–111)
Creatinine, Ser: 1.1 mg/dL (ref 0.61–1.24)
Glucose, Bld: 204 mg/dL — ABNORMAL HIGH (ref 70–99)
HCT: 46 % (ref 39.0–52.0)
Hemoglobin: 15.6 g/dL (ref 13.0–17.0)
Potassium: 4.2 mmol/L (ref 3.5–5.1)
Sodium: 127 mmol/L — ABNORMAL LOW (ref 135–145)
TCO2: 24 mmol/L (ref 22–32)

## 2023-04-22 LAB — URINALYSIS, ROUTINE W REFLEX MICROSCOPIC
Bacteria, UA: NONE SEEN
Bilirubin Urine: NEGATIVE
Glucose, UA: 150 mg/dL — AB
Ketones, ur: NEGATIVE mg/dL
Nitrite: NEGATIVE
Protein, ur: NEGATIVE mg/dL
Specific Gravity, Urine: 1.006 (ref 1.005–1.030)
pH: 5 (ref 5.0–8.0)

## 2023-04-22 NOTE — Discharge Instructions (Addendum)
Call to schedule a follow-up appointment with urology as well as a voiding trial for removal of your Foley catheter. Follow up with your primary doctor in the interim.

## 2023-04-22 NOTE — ED Notes (Signed)
I-stat lactic in mini lab at this time

## 2023-04-22 NOTE — ED Triage Notes (Addendum)
Pt reports since surgery on Monday been having pain with urination. Today patient started having dribbling and unable to have a steady urine flow since last night.  Pt started on Macrobid on Wednesday w/o relief. Hx of enlarged prostate  C/o lower abd pain, tender on palpitation.

## 2023-04-22 NOTE — ED Provider Notes (Signed)
[place] EMERGENCY DEPARTMENT AT [facility] Provider Note   CSN: [id number] Arrival date & time: 04/22/23  2134     History {Add pertinent medical, surgical, social history, OB history to HPI:1} No chief complaint on file.   Randall Lewis is a 67 y.o. male.  67 year old male presents to the emergency department for evaluation of dysuria and decreased urinary stream.  He is 4 days s/p craniotomy at [facility] for brain mass. States that he started to have dysuria post procedure. Was started on Macrobid on Wednesday. Has noticed issues voiding today; only able to dribble w/o steady urinary flow. No hematuria, clots, fever, vomiting. Does c/o some associated suprapubic pressure-like discomfort which is aggravated by palpation.  The history is provided by the patient. No language interpreter was used.       Home Medications Prior to Admission medications   Medication Sig Start Date End Date Taking? Authorizing Provider  amLODipine (NORVASC) 5 MG tablet Take 5 mg by mouth daily.    [provider]  aspirin 81 MG tablet Take 81 mg by mouth every morning.     [provider]  buPROPion (WELLBUTRIN SR) 100 MG 12 hr tablet Take 100 mg by mouth every morning. 11/29/19   [provider]  fish oil-omega-3 fatty acids 1000 MG capsule Take 2 g by mouth daily.     [provider]  losartan-hydrochlorothiazide (HYZAAR) 100-12.5 MG tablet Take 1 tablet by mouth daily.    [provider]  melatonin 3 MG TABS tablet Take 3 mg by mouth as needed.    [provider]  metFORMIN (GLUCOPHAGE-XR) 500 MG 24 hr tablet Take 500 mg by mouth 2 (two) times daily. 01/17/23   [provider]  pioglitazone (ACTOS) 15 MG tablet Take 15 mg by mouth daily. 11/26/19   [provider]  simvastatin (ZOCOR) 20 MG tablet Take 20 mg by mouth daily. 05/16/15   [provider]  Vitamin D, Ergocalciferol, (DRISDOL) 50000 UNITS CAPS capsule  Take 50,000 Units by mouth every Friday.  04/19/15   [provider]      Allergies    Sildenafil and Gadolinium derivatives    Review of Systems   Review of Systems Ten systems reviewed and are negative for acute change, except as noted in the HPI.    Physical Exam Updated Vital Signs BP (!) 210/105   Pulse 89   Temp 98 F (36.7 C)   Resp 20   SpO2 100%   Physical Exam Vitals and nursing note reviewed.  Constitutional:      General: He is not in acute distress.    Appearance: He is well-developed. He is not diaphoretic.     Comments: Nontoxic appearing  HENT:     Head: Normocephalic and atraumatic.     Comments: R sided craniotomy flap appears C/D/I. Eyes:     General: No scleral icterus.    Conjunctiva/sclera: Conjunctivae normal.  Pulmonary:     Effort: Pulmonary effort is normal. No respiratory distress.     Comments: Respirations even and unlabored Musculoskeletal:        General: Normal range of motion.     Cervical back: Normal range of motion.  Skin:    General: Skin is warm and dry.     Coloration: Skin is not pale.     Findings: No erythema or rash.  Neurological:     Mental Status: He is alert and oriented to person, place, and time.  Comments: Independently ambulating, pacing, around exam room.  Psychiatric:        Behavior: Behavior normal.     ED Results / Procedures / Treatments   Labs (all labs ordered are listed, but only abnormal results are displayed) Labs Reviewed  URINE CULTURE  URINALYSIS, ROUTINE W REFLEX MICROSCOPIC  I-STAT CHEM 8, ED    EKG None  Radiology No results found.  Procedures Procedures  {Document cardiac monitor, telemetry assessment procedure when appropriate:1}  Medications Ordered in ED Medications - No data to display  ED Course/ Medical Decision Making/ A&P Clinical Course as of 04/22/23 2237  [facility] Apr 22, 2023  2220 Bladder scan [KH]    Clinical Course User Index [KH] Antony Madura,  PA-C   {   Click here for ABCD2, HEART and other calculatorsREFRESH Note before signing :1}                              Medical Decision Making Amount and/or Complexity of Data Reviewed Labs: ordered.   ***  {Document critical care time when appropriate:1} {Document review of labs and clinical decision tools ie heart score, Chads2Vasc2 etc:1}  {Document your independent review of radiology images, and any outside records:1} {Document your discussion with family members, caretakers, and with consultants:1} {Document social determinants of health affecting pt's care:1} {Document your decision making why or why not admission, treatments were needed:1} Final Clinical Impression(s) / ED Diagnoses Final diagnoses:  None    Rx / DC Orders ED Discharge Orders     None

## 2023-04-23 NOTE — ED Notes (Signed)
Patient verbalized he feels relief from when he came in and feels a lot better. Placed leg bag on patient for d/c and also educated patient on care and instructions on how to use leg bag. Patient and wife at bedside verbalized understanding of teaching. Patient vitals obtained IV discontinued and patient d/c'd at this time.

## 2023-04-23 NOTE — ED Notes (Signed)
2100 cc's removed from patient catheter bag at this time

## 2023-04-24 LAB — URINE CULTURE: Culture: <10000 — AB

## 2023-04-29 DIAGNOSIS — C718 Malignant neoplasm of overlapping sites of brain: Secondary | ICD-10-CM | POA: Diagnosis not present

## 2023-05-02 DIAGNOSIS — R338 Other retention of urine: Secondary | ICD-10-CM | POA: Diagnosis not present

## 2023-05-02 DIAGNOSIS — N401 Enlarged prostate with lower urinary tract symptoms: Secondary | ICD-10-CM | POA: Diagnosis not present

## 2023-05-02 DIAGNOSIS — C719 Malignant neoplasm of brain, unspecified: Secondary | ICD-10-CM | POA: Diagnosis not present

## 2023-05-02 DIAGNOSIS — C711 Malignant neoplasm of frontal lobe: Secondary | ICD-10-CM | POA: Diagnosis not present

## 2023-05-03 DIAGNOSIS — Z4802 Encounter for removal of sutures: Secondary | ICD-10-CM | POA: Diagnosis not present

## 2023-05-03 DIAGNOSIS — E1151 Type 2 diabetes mellitus with diabetic peripheral angiopathy without gangrene: Secondary | ICD-10-CM | POA: Diagnosis not present

## 2023-05-03 DIAGNOSIS — R339 Retention of urine, unspecified: Secondary | ICD-10-CM | POA: Diagnosis not present

## 2023-05-03 DIAGNOSIS — I10 Essential (primary) hypertension: Secondary | ICD-10-CM | POA: Diagnosis not present

## 2023-05-03 DIAGNOSIS — C719 Malignant neoplasm of brain, unspecified: Secondary | ICD-10-CM | POA: Diagnosis not present

## 2023-05-03 DIAGNOSIS — R338 Other retention of urine: Secondary | ICD-10-CM | POA: Diagnosis not present

## 2023-05-03 DIAGNOSIS — Z85841 Personal history of malignant neoplasm of brain: Secondary | ICD-10-CM | POA: Diagnosis not present

## 2023-05-03 DIAGNOSIS — N401 Enlarged prostate with lower urinary tract symptoms: Secondary | ICD-10-CM | POA: Diagnosis not present

## 2023-05-04 DIAGNOSIS — R339 Retention of urine, unspecified: Secondary | ICD-10-CM | POA: Diagnosis not present

## 2023-05-05 ENCOUNTER — Other Ambulatory Visit: Payer: Self-pay | Admitting: Radiation Therapy

## 2023-05-05 DIAGNOSIS — C719 Malignant neoplasm of brain, unspecified: Secondary | ICD-10-CM

## 2023-05-09 ENCOUNTER — Other Ambulatory Visit: Payer: Self-pay | Admitting: *Deleted

## 2023-05-09 ENCOUNTER — Other Ambulatory Visit: Payer: Self-pay | Admitting: Radiation Therapy

## 2023-05-09 ENCOUNTER — Inpatient Hospital Stay
Admission: RE | Admit: 2023-05-09 | Discharge: 2023-05-09 | Disposition: A | Discharge status: Home / Self Care | Payer: Self-pay | Source: Ambulatory Visit | Attending: Internal Medicine | Admitting: Internal Medicine

## 2023-05-09 DIAGNOSIS — C719 Malignant neoplasm of brain, unspecified: Secondary | ICD-10-CM

## 2023-05-09 NOTE — Progress Notes (Signed)
Requested images for MRI Brains dated 04/18/2023 and 04/06/2023 from [place].

## 2023-05-10 DIAGNOSIS — C718 Malignant neoplasm of overlapping sites of brain: Secondary | ICD-10-CM | POA: Diagnosis not present

## 2023-05-10 DIAGNOSIS — D496 Neoplasm of unspecified behavior of brain: Secondary | ICD-10-CM | POA: Diagnosis not present

## 2023-05-16 ENCOUNTER — Inpatient Hospital Stay: Payer: Medicare PPO | Attending: Internal Medicine

## 2023-05-16 DIAGNOSIS — Z91041 Radiographic dye allergy status: Secondary | ICD-10-CM | POA: Diagnosis not present

## 2023-05-16 DIAGNOSIS — C711 Malignant neoplasm of frontal lobe: Secondary | ICD-10-CM | POA: Insufficient documentation

## 2023-05-16 DIAGNOSIS — C718 Malignant neoplasm of overlapping sites of brain: Secondary | ICD-10-CM | POA: Diagnosis not present

## 2023-05-16 DIAGNOSIS — Z87891 Personal history of nicotine dependence: Secondary | ICD-10-CM | POA: Insufficient documentation

## 2023-05-16 DIAGNOSIS — D496 Neoplasm of unspecified behavior of brain: Secondary | ICD-10-CM | POA: Diagnosis not present

## 2023-05-16 DIAGNOSIS — C719 Malignant neoplasm of brain, unspecified: Secondary | ICD-10-CM | POA: Diagnosis not present

## 2023-05-16 DIAGNOSIS — Z803 Family history of malignant neoplasm of breast: Secondary | ICD-10-CM | POA: Insufficient documentation

## 2023-05-16 DIAGNOSIS — Z9221 Personal history of antineoplastic chemotherapy: Secondary | ICD-10-CM | POA: Insufficient documentation

## 2023-05-17 DIAGNOSIS — C718 Malignant neoplasm of overlapping sites of brain: Secondary | ICD-10-CM | POA: Diagnosis not present

## 2023-05-17 DIAGNOSIS — R338 Other retention of urine: Secondary | ICD-10-CM | POA: Diagnosis not present

## 2023-05-17 DIAGNOSIS — N401 Enlarged prostate with lower urinary tract symptoms: Secondary | ICD-10-CM | POA: Diagnosis not present

## 2023-05-18 NOTE — Progress Notes (Signed)
Location/Histology of Brain Tumor: Right temporoparietal Lesion   Patient presented to the ER in early November 2024 with complaints of a fall at home due to dizziness and high blood pressure.  Imaging obtained revealed a 4.0 x 4.5 x 4.2 cm right temporoparietal heterogeneously enhancing brain tumor.  Pathology: 04/18/2023     Past or anticipated interventions, if any, per neurosurgery:  Dr. Zachery Conch- [facility] -Craniotomy with excision supratentorial brain tumor resection 04/18/2023   Past or anticipated interventions, if any, per medical oncology:  Dr. Barbaraann Cao 05/19/2023   Dose of Decadron, if applicable: No  Recent neurologic symptoms, if any:  Seizures: No Headaches: No Nausea: None Dizziness/ataxia: No Difficulty with hand coordination:  Focal numbness/weakness: No Visual deficits/changes: No Confusion/Memory deficits: No    SAFETY ISSUES: Prior radiation? Left Frontal Brain, 6 weeks Pacemaker/ICD? No Possible current pregnancy? N/a Is the patient on methotrexate? 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

## 2023-05-19 ENCOUNTER — Inpatient Hospital Stay: Payer: Medicare PPO | Admitting: Internal Medicine

## 2023-05-19 ENCOUNTER — Other Ambulatory Visit: Payer: Self-pay

## 2023-05-19 ENCOUNTER — Other Ambulatory Visit (HOSPITAL_COMMUNITY): Payer: Self-pay

## 2023-05-19 ENCOUNTER — Encounter: Payer: Self-pay | Admitting: Internal Medicine

## 2023-05-19 VITALS — BP 116/68 | HR 72 | Temp 97.9°F | Resp 20 | Wt 235.2 lb

## 2023-05-19 DIAGNOSIS — Z9221 Personal history of antineoplastic chemotherapy: Secondary | ICD-10-CM | POA: Diagnosis not present

## 2023-05-19 DIAGNOSIS — C719 Malignant neoplasm of brain, unspecified: Secondary | ICD-10-CM

## 2023-05-19 DIAGNOSIS — Z87891 Personal history of nicotine dependence: Secondary | ICD-10-CM | POA: Diagnosis not present

## 2023-05-19 DIAGNOSIS — Z803 Family history of malignant neoplasm of breast: Secondary | ICD-10-CM | POA: Diagnosis not present

## 2023-05-19 DIAGNOSIS — C711 Malignant neoplasm of frontal lobe: Secondary | ICD-10-CM | POA: Diagnosis not present

## 2023-05-19 DIAGNOSIS — D496 Neoplasm of unspecified behavior of brain: Secondary | ICD-10-CM | POA: Diagnosis not present

## 2023-05-19 MED ORDER — TEMOZOLOMIDE 140 MG PO CAPS
140.0000 mg | ORAL_CAPSULE | Freq: Every day | ORAL | 0 refills | Status: DC
Start: 2023-05-26 — End: 2023-07-11
  Filled 2023-05-20: qty 42, 42d supply, fill #0

## 2023-05-19 MED ORDER — ONDANSETRON HCL 8 MG PO TABS
8.0000 mg | ORAL_TABLET | Freq: Three times a day (TID) | ORAL | 1 refills | Status: DC | PRN
  Filled 2023-05-19: qty 30, 10d supply, fill #0

## 2023-05-19 MED ORDER — TEMOZOLOMIDE 20 MG PO CAPS
20.0000 mg | ORAL_CAPSULE | Freq: Every day | ORAL | 0 refills | Status: DC
Start: 2023-05-26 — End: 2023-07-11
  Filled 2023-05-20: qty 42, 42d supply, fill #0

## 2023-05-19 NOTE — Progress Notes (Signed)

## 2023-05-19 NOTE — Progress Notes (Signed)
[facility] [address]  [place], [place] [address] [phone]   New Patient Evaluation  Date of Service: 05/19/23 Patient Name: Randall Lewis Patient MRN: [id number] Patient DOB: Apr 01, 1956 Provider: Henreitta Leber, MD  Identifying Statement:  Randall Lewis is a 67 y.o. male with right frontal glioblastoma who presents for initial consultation and evaluation.    Referring Provider: Garlan Fillers, MD [address] [place],  [place] [address]  Oncologic History: Anaplastic oligodendroglioma of frontal lobe (CMS/HHS-HCC)  04/16/2005 Surgery  Craniotomy for resection of left frontal lesion. 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 Lost to follow up after 03/18/2010  05/02/2023 Presentation  Presentation to the [facility] at [facility]. Recommendations pending final pathology. Likely XRT/Temozolomide as this is a high grade glioma. 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 Pathology reveals Infiltrating High Grade Glioma pending final molecular studies for integrated diagnosis.  Biomarkers:  MGMT Unknown.  IDH 1/2 Wild type.  EGFR Amplified  TERT "Mutated   History of Present Illness: The patient's records from the referring physician were obtained and reviewed and the patient interviewed to confirm this HPI.  Randall Lewis presents  for follow up after craniotomy, resection of recurrent primary brain tumor at [facility] on 04/18/23.  He tolerated surgery well, no ill effects.  He is functionally intact, no further headaches, no seizures.  Recently increased blood pressure medication with his PCP.  Has visit with radiation oncology upcoming as well.  Medications: Current Outpatient Medications on File Prior to Visit  Medication Sig Dispense Refill   amLODipine (NORVASC) 5 MG tablet Take 5 mg by mouth daily.     aspirin 81 MG tablet Take 81 mg by mouth every morning.      buPROPion (WELLBUTRIN SR) 100 MG 12 hr tablet Take 100 mg by mouth every morning.     fish oil-omega-3 fatty acids 1000 MG capsule Take 2 g by mouth daily.      losartan-hydrochlorothiazide (HYZAAR) 100-12.5 MG tablet Take 1 tablet by mouth daily.     melatonin 3 MG TABS tablet Take 3 mg by mouth as needed.     metFORMIN (GLUCOPHAGE-XR) 500 MG 24 hr tablet Take 500 mg by mouth 2 (two) times daily.     pioglitazone (ACTOS) 15 MG tablet Take 15 mg by mouth daily.     simvastatin (ZOCOR) 20 MG tablet Take 20 mg by mouth daily.     Vitamin D, Ergocalciferol, (DRISDOL) 50000 UNITS CAPS capsule Take 50,000 Units by mouth every Friday.      No current facility-administered medications on file prior to visit.    Allergies:  Allergies  Allergen Reactions   Sildenafil Other (See Comments) and Nausea Only   Gadolinium Derivatives Nausea And Vomiting    Per pt , always get sick with gado and never offered anti nausea meds.   Past Medical History:  Past Medical History:  Diagnosis Date   BPH (benign prostatic hyperplasia)  Cancer [facility]) 2006   Brain tumor, in remission after chemo radiation and surgery   Diabetes mellitus without complication [facility])    ED (erectile dysfunction)    Heart murmur    History of heart murmur   Hyperlipidemia    Hypertension    Sleep apnea    USES C-PAP   Past Surgical History:  Past Surgical History:  Procedure Laterality Date    BRAIN SURGERY  2006   BRAIN TUMOR  - CANCER   COLONOSCOPY     CYSTOSCOPY N/A 07/29/2015   Procedure: FLEXIBLE CYSTOSCOPY;  Surgeon: Bjorn Pippin, MD;  Location: [facility] ORS;  Service: Urology;  Laterality: N/A;   FOOT BONE EXCISION  1989   PENILE PROSTHESIS IMPLANT N/A 07/29/2015   Procedure: PENILE PROSTHESIS;  Surgeon: Bjorn Pippin, MD;  Location: [facility];  Service: Urology;  Laterality: N/A;   right knee meniscus repair  2010   THYROID LOBECTOMY  05/12/2012   Procedure: THYROID LOBECTOMY;  Surgeon: Velora Heckler, MD;  Location: [facility];  Service: General;  Laterality: Left;  left thyroid lobectomy   Social History:  Social History   Socioeconomic History   Marital status: Married    Spouse name: [name]   Number of children: 2   Years of education: Not on file   Highest education level: Master's degree (e.g., MA, MS, MEng, MEd, MSW, MBA)  Occupational History   Not on file  Tobacco Use   Smoking status: Former    Current packs/day: 0.00    Average packs/day: 1 pack/day for 23.0 years (23.0 ttl pk-yrs)    Types: Cigarettes    Start date: 04/26/1973    Quit date: 04/26/1996    Years since quitting: 27.0   Smokeless tobacco: Never  Substance and Sexual Activity   Alcohol use: No   Drug use: No   Sexual activity: Not on file  Other Topics Concern   Not on file  Social History Narrative   Lives with wife   Right handed   Caffeine: 2 cups of coffee and 1 glass of tea/soda a day   Social Drivers of Corporate investment banker Strain: Low Risk  (05/01/2023)   Received from [facility]   Overall Financial Resource Strain (CARDIA)    Difficulty of Paying Living Expenses: Not hard at all  Food Insecurity: No Food Insecurity (05/01/2023)   Received from [facility]   Hunger Vital Sign    Worried About Running Out of Food in the Last Year: Never true    Ran Out of Food in the Last Year: Never true  Transportation Needs: No Transportation Needs  (05/01/2023)   Received from [facility]    In the past 12 months, has lack of transportation kept you from medical appointments or from getting medications?: No    Lack of Transportation (Non-Medical): No  Physical Activity: Not on file  Stress: Not on file  Social Connections: Not on file  Intimate Partner Violence: Not on file   Family History:  Family History  Problem Relation Age of Onset   Breast cancer Mother    Cancer Brother 57   Heart attack Brother    Heart attack Brother    Birth defects Son    Colon cancer Neg Hx    Colon polyps Neg Hx    Esophageal cancer Neg Hx    Stomach cancer Neg Hx    Rectal cancer Neg Hx  Review of Systems: Constitutional: Doesn't report fevers, chills or abnormal weight loss Eyes: Doesn't report blurriness of vision Ears, nose, mouth, throat, and face: Doesn't report sore throat Respiratory: Doesn't report cough, dyspnea or wheezes Cardiovascular: Doesn't report palpitation, chest discomfort  Gastrointestinal:  Doesn't report nausea, constipation, diarrhea GU: Doesn't report incontinence Skin: Doesn't report skin rashes Neurological: Per HPI Musculoskeletal: Doesn't report joint pain Behavioral/Psych: Doesn't report anxiety  Physical Exam: Vitals:   05/19/23 1528  BP: 116/68  Pulse: 72  Resp: 20  Temp: 97.9 F (36.6 C)  SpO2: 97%   KPS: 90. General: Alert, cooperative, pleasant, in no acute distress Head: Normal EENT: No conjunctival injection or scleral icterus.  Lungs: Resp effort normal Cardiac: Regular rate Abdomen: Non-distended abdomen Skin: No rashes cyanosis or petechiae. Extremities: No clubbing or edema  Neurologic Exam: Mental Status: Awake, alert, attentive to examiner. Oriented to self and environment. Language is fluent with intact comprehension.  Cranial Nerves: Visual acuity is grossly normal. Visual fields are full. Extra-ocular movements intact. No ptosis.  Face is symmetric Motor: Tone and bulk are normal. Power is full in both arms and legs. Reflexes are symmetric, no pathologic reflexes present.  Sensory: Intact to light touch Gait: Normal.   Labs: I have reviewed the data as listed    Component Value Date/Time   NA 127 (L) 04/22/2023 2300   K 4.2 04/22/2023 2300   CL 95 (L) 04/22/2023 2300   CO2 22 04/03/2023 0019   GLUCOSE 204 (H) 04/22/2023 2300   BUN 22 04/22/2023 2300   CREATININE 1.10 04/22/2023 2300   CREATININE 1.23 08/14/2019 1438   CREATININE 1.31 (H) 02/25/2012 1438   CALCIUM 8.8 (L) 04/03/2023 0019   PROT 7.4 08/14/2019 1438   ALBUMIN 4.2 08/14/2019 1438   AST 22 08/14/2019 1438   ALT 31 08/14/2019 1438   ALKPHOS 61 08/14/2019 1438   BILITOT 0.4 08/14/2019 1438   GFRNONAA >60 04/03/2023 0019   GFRNONAA >60 08/14/2019 1438   GFRNONAA >60 02/25/2012 1438   GFRAA >60 08/14/2019 1438   GFRAA >60 02/25/2012 1438   Lab Results  Component Value Date   WBC 6.5 04/03/2023   NEUTROABS 3.7 08/14/2019   HGB 15.6 04/22/2023   HCT 46.0 04/22/2023   MCV 80.2 04/03/2023   PLT 219 04/03/2023    Imaging: MRI BRAIN INTRAOPERATIVE WITH CONTRAST   INDICATION: post resection. is there residual tumor?, D49.6 Neoplasm of  unspecified behavior of brain (CMS/HHS-HCC)   COMPARISON: April 06, 2023   TECHNIQUE/PROTOCOL: Standard adult brain protocol without and with IV  contrast.   FINDINGS:  Status post right frontal craniectomy. Expected pneumocephalus. Expected  fluid within the resection cavity and within the craniectomy site.   Right temporoparietal resection cavity with post-operative intrinsically T1  hyperintense foci along the posterior and anteroinferior aspects. No  nodular enhancement. Linear marginal enhancement, especially along the  superior aspect, is non-specific.   5 mm of leftward midline shift. Local regional mass effect on the right  lateral ventricle. Right temporal convexity extra-axial gas  measuring 1.3  cm in thickness. Left anterior frontal encephalomalacia.   Limited assessment for acute findings given limited protocol.   IMPRESSION:  Post-operative changes status post right temporoparietal mass resection  with internal blood products but without definite evidence of residual  tumor.   Pathology: A.  Brain, right temporoparietal lesion, resection:   Glioblastoma, IDH-wildtype, CNS WHO grade 4. See comments.   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 The 2006 neoplasm was diagnosed as anaplastic oligodendroglioma based on 1p/19q FISH results. Whole-arm chromosome testing and IDH sequencing were not available in 2006, so that diagnosis would ideally be reconfirmed using 2021 WHO criteria and newly-available tests. Unfortunately, we have found tissue blocks older than 10 years to be unsuitable for DNA and RNA analysis. However, we will attempt testing on the 2006 blocks to see if we can get more information; if we are successful those results will be reported separately with an update to this report. The 2006 neoplasm was treated with  XRT and concurrent temozolamide ending in 2007 followed by adjuvant temozolamide ending in 2008. Given the time elapsed since then, a radiation-induced glioma (RIG) was considered. However, the molecular profile of this new glioma does not support a radiation-induced glioma (i.e. it has no TP53 mutation, it has a TERT promoter mutation, and it has limited chromothripsis of chromosome 7 and 13 rather than the more diffuse damage expected in RIGs [PMID: [id number]].  This new neoplasm could therefore represent either a separate neoplastic process or a recurrence with progression of the 2006 neoplasm. If it is a separate neoplasm it best fits the profile of a glioblastoma. A spontaneous progression of the 2006 lesion seems less likely given the clinical behavior, so overall this is best considered as a new-onset glioblastoma for the purposes of treatment while we wait for more molecular information. The patient's case was discussed with RN Holzmacher and Dr. Morrie Sheldon via secure email on 12.2.24 - 12.4.24.   Assessment/Plan Glioblastoma, IDH-wildtype (HCC)  We appreciate the opportunity to participate in the care of Randall Lewis.   We had an extensive conversation with him regarding pathology, prognosis, and available treatment pathways for glioblastoma.    We ultimately recommended proceeding with course of intensity modulated radiation therapy and concurrent daily Temozolomide.  Radiation will be administered Mon-Fri over 6 weeks, Temodar will be dosed at 75mg /m2 to be given daily over 42 days.  We reviewed side effects of temodar, including fatigue, nausea/vomiting, constipation, and cytopenias.  Informed consent was verbally obtained at bedside to proceed with oral chemotherapy.  Chemotherapy should be held for the following:  ANC less than 1,000  Platelets less than 100,000  LFT or creatinine greater than 2x ULN  If clinical concerns/contraindications develop  Every 2 weeks during  radiation, labs will be checked accompanied by a clinical evaluation in the brain tumor clinic. Screening for potential clinical trials was performed and discussed using eligibility criteria for active protocols at [facility], loco-regional tertiary centers, as well as national database available on [email or web address].[email or web address].    The patient is not a candidate for a research protocol at this time due to no suitable study identified.   We spent twenty additional minutes teaching regarding the natural history,  biology, and historical experience in the treatment of brain tumors. We then discussed in detail the current recommendations for therapy focusing on the mode of administration, mechanism of action, anticipated toxicities, and quality of life issues associated with this plan. We also provided teaching sheets for the patient to take home as an additional resource.  All questions were answered. The patient knows to call the clinic with any problems, questions or concerns. No barriers to learning were detected.  The total time spent in the encounter was 60 minutes and more than 50% was on counseling and review of test results   Henreitta Leber, MD Medical Director of Neuro-Oncology [facility] 05/19/23 3:36 PM

## 2023-05-20 ENCOUNTER — Telehealth: Payer: Self-pay | Admitting: Pharmacy Technician

## 2023-05-20 ENCOUNTER — Other Ambulatory Visit: Payer: Self-pay

## 2023-05-20 ENCOUNTER — Ambulatory Visit
Admission: RE | Admit: 2023-05-20 | Discharge: 2023-05-20 | Disposition: A | Discharge status: Home / Self Care | Payer: Medicare PPO | Source: Ambulatory Visit | Attending: Radiation Oncology | Admitting: Radiation Oncology

## 2023-05-20 ENCOUNTER — Other Ambulatory Visit (HOSPITAL_COMMUNITY): Payer: Self-pay

## 2023-05-20 ENCOUNTER — Other Ambulatory Visit: Payer: Self-pay | Admitting: Pharmacy Technician

## 2023-05-20 ENCOUNTER — Telehealth: Payer: Self-pay

## 2023-05-20 ENCOUNTER — Encounter: Payer: Self-pay | Admitting: Internal Medicine

## 2023-05-20 VITALS — BP 118/76 | HR 65 | Temp 97.7°F | Resp 18 | Ht 71.0 in

## 2023-05-20 DIAGNOSIS — Z923 Personal history of irradiation: Secondary | ICD-10-CM | POA: Diagnosis not present

## 2023-05-20 DIAGNOSIS — G4730 Sleep apnea, unspecified: Secondary | ICD-10-CM | POA: Diagnosis not present

## 2023-05-20 DIAGNOSIS — E119 Type 2 diabetes mellitus without complications: Secondary | ICD-10-CM | POA: Diagnosis not present

## 2023-05-20 DIAGNOSIS — Z79630 Long term (current) use of alkylating agent: Secondary | ICD-10-CM | POA: Diagnosis not present

## 2023-05-20 DIAGNOSIS — Z7984 Long term (current) use of oral hypoglycemic drugs: Secondary | ICD-10-CM | POA: Insufficient documentation

## 2023-05-20 DIAGNOSIS — E785 Hyperlipidemia, unspecified: Secondary | ICD-10-CM | POA: Insufficient documentation

## 2023-05-20 DIAGNOSIS — N400 Enlarged prostate without lower urinary tract symptoms: Secondary | ICD-10-CM | POA: Diagnosis not present

## 2023-05-20 DIAGNOSIS — I10 Essential (primary) hypertension: Secondary | ICD-10-CM | POA: Insufficient documentation

## 2023-05-20 DIAGNOSIS — Z9221 Personal history of antineoplastic chemotherapy: Secondary | ICD-10-CM | POA: Diagnosis not present

## 2023-05-20 DIAGNOSIS — Z87891 Personal history of nicotine dependence: Secondary | ICD-10-CM | POA: Insufficient documentation

## 2023-05-20 DIAGNOSIS — Z803 Family history of malignant neoplasm of breast: Secondary | ICD-10-CM | POA: Diagnosis not present

## 2023-05-20 DIAGNOSIS — C712 Malignant neoplasm of temporal lobe: Secondary | ICD-10-CM

## 2023-05-20 DIAGNOSIS — Z79899 Other long term (current) drug therapy: Secondary | ICD-10-CM | POA: Diagnosis not present

## 2023-05-20 NOTE — Progress Notes (Signed)
Patient counseled in telephone encounter opened on 05/20/23.  Bethel Born, PharmD Hematology/Oncology Clinical Pharmacist [facility] [phone]

## 2023-05-20 NOTE — Progress Notes (Signed)
Radiation Oncology         [phone] ________________________________  Name: Randall Lewis        MRN: [id number]  Date of Service: 05/20/2023 DOB: 1955-10-18  [id number], Barry Dienes, MD  Barbaraann Cao Georgeanna Lea, MD     REFERRING PHYSICIAN: Henreitta Leber, MD   DIAGNOSIS: The encounter diagnosis was Glioblastoma of temporal lobe [facility]).  High grade glioma; s/p surgical resection  HISTORY OF PRESENT ILLNESS: Randall Lewis is a 67 y.o. male seen at the request of Dr. Barbaraann Cao.  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  He underwent surgical resection on 04/16/05 and underwent concurrent Temozolomide and radiation therapy on 05/11/05 - 06/22/2005 followed by maintenance Temozolomide completed on 08/10/2006. He was subsequently lost to follow-up after 03/18/2010.   Most recently, the patient presented to the ER with concerns of high blood pressure. He states his systolic was in the 230s. CT of the head revealed a 5.9 cm mass in the parieto-occipital region.  Subsequent MRI of the brain patient underwent surgical resection on 04/18/2023.  Final pathology revealed infiltrating high-grade glioma. He met with Dr. Barbaraann Cao on 05/19/2023 and decided to proceed with daily Temozolomide x 42 days given with a course of radiation therapy. The patient is seen today to discuss radiotherapy.   PREVIOUS RADIATION THERAPY: Yes  05/11/05 - 06/22/2005: Received 6 weeks of radiation to the resection cavity within the frontal lobe under the care of Dr. Kathrynn Running.    PAST MEDICAL HISTORY:  Past Medical History:  Diagnosis Date   BPH (benign prostatic hyperplasia)    Cancer (HCC) 2006   Brain tumor, in remission after chemo radiation and surgery   Diabetes mellitus without complication (HCC)    ED (erectile dysfunction)    Heart murmur    History of heart murmur   Hyperlipidemia    Hypertension    Sleep apnea    USES C-PAP       PAST SURGICAL  HISTORY: Past Surgical History:  Procedure Laterality Date   BRAIN SURGERY  2006   BRAIN TUMOR  - CANCER   COLONOSCOPY     CYSTOSCOPY N/A 07/29/2015   Procedure: FLEXIBLE CYSTOSCOPY;  Surgeon: Bjorn Pippin, MD;  Location: [facility];  Service: Urology;  Laterality: N/A;   FOOT BONE EXCISION  1989   PENILE PROSTHESIS IMPLANT N/A 07/29/2015   Procedure: PENILE PROSTHESIS;  Surgeon: Bjorn Pippin, MD;  Location: [facility];  Service: Urology;  Laterality: N/A;   right knee meniscus repair  2010   THYROID LOBECTOMY  05/12/2012   Procedure: THYROID LOBECTOMY;  Surgeon: Velora Heckler, MD;  Location: [facility];  Service: General;  Laterality: Left;  left thyroid lobectomy     FAMILY HISTORY:  Family History  Problem Relation Age of Onset   Breast cancer Mother    Cancer Brother 99   Heart attack Brother    Heart attack Brother    Birth defects Son    Colon cancer Neg Hx    Colon polyps Neg Hx    Esophageal cancer Neg Hx    Stomach cancer Neg Hx    Rectal cancer Neg Hx      SOCIAL HISTORY:  reports that he quit smoking about 27 years ago. His smoking use included cigarettes. He started smoking about 50 years ago. He has a 23 pack-year smoking history. He has never used smokeless tobacco. He reports that he does not drink alcohol and does  not use drugs.   ALLERGIES: Sildenafil and Gadolinium derivatives   MEDICATIONS:  Current Outpatient Medications  Medication Sig Dispense Refill   acetaminophen (TYLENOL) 325 MG tablet Take 975 mg by mouth every 6 (six) hours as needed.     amLODipine (NORVASC) 5 MG tablet Take 5 mg by mouth daily.     buPROPion (WELLBUTRIN SR) 100 MG 12 hr tablet Take 100 mg by mouth every morning.     dapagliflozin propanediol (FARXIGA) 10 MG TABS tablet Take 10 mg by mouth daily.     finasteride (PROSCAR) 5 MG tablet Take 5 mg by mouth daily.     losartan-hydrochlorothiazide (HYZAAR) 100-12.5 MG tablet Take 1 tablet by mouth daily.     metFORMIN (GLUCOPHAGE-XR) 500 MG 24 hr  tablet Take 1,000 mg by mouth daily.     metoprolol tartrate (LOPRESSOR) 12.5 mg TABS tablet Take 12.5 mg by mouth daily.     [START ON 05/26/2023] ondansetron (ZOFRAN) 8 MG tablet Take 1 tablet (8 mg total) by mouth every 8 (eight) hours as needed for nausea or vomiting. May take 30-60 minutes prior to Temodar administration if nausea/vomiting occurs as needed. 30 tablet 1   oxyCODONE (OXY IR/ROXICODONE) 5 MG immediate release tablet Take 5 mg by mouth every 4 (four) hours as needed.     pioglitazone (ACTOS) 15 MG tablet Take 15 mg by mouth daily.     simvastatin (ZOCOR) 20 MG tablet Take 20 mg by mouth daily.     tamsulosin (FLOMAX) 0.4 MG CAPS capsule Take 0.4 mg by mouth daily.     [START ON 05/26/2023] temozolomide (TEMODAR) 140 MG capsule Take 1 capsule (140 mg total) by mouth daily. May take on an empty stomach to decrease nausea & vomiting. 42 capsule 0   [START ON 05/26/2023] temozolomide (TEMODAR) 20 MG capsule Take 1 capsule (20 mg total) by mouth daily. May take on an empty stomach to decrease nausea & vomiting. 42 capsule 0   No current facility-administered medications for this encounter.     REVIEW OF SYSTEMS: On review of systems, the patient reports to be doing well since surgery. He denies any headaches, visual disturbances, issues ambulation, changes in his memory or cognition, nausea, or seizure-like activity.      PHYSICAL EXAM:  Wt Readings from Last 3 Encounters:  05/19/23 235 lb 3.2 oz (106.7 kg)  04/02/23 247 lb (112 kg)  02/07/23 245 lb 9.6 oz (111.4 kg)   Temp Readings from Last 3 Encounters:  05/20/23 97.7 F (36.5 C) (Temporal)  05/19/23 97.9 F (36.6 C)  04/23/23 98.1 F (36.7 C)   BP Readings from Last 3 Encounters:  05/20/23 118/76  05/19/23 116/68  04/23/23 (!) 151/94   Pulse Readings from Last 3 Encounters:  05/20/23 65  05/19/23 72  04/23/23 86   Pain Assessment Pain Score: 0-No pain/10  In general this is a well appearing male in no  acute distress. He's alert and oriented x4 and appropriate throughout the examination. Cardiopulmonary assessment is negative for acute distress and he exhibits normal effort.     ECOG = 0  0 - Asymptomatic (Fully active, able to carry on all predisease activities without restriction)  1 - Symptomatic but completely ambulatory (Restricted in physically strenuous activity but ambulatory and able to carry out work of a light or sedentary nature. For example, light housework, office work)  2 - Symptomatic, <50% in bed during the day (Ambulatory and capable of all self care but unable to  carry out any work activities. Up and about more than 50% of waking hours)  3 - Symptomatic, >50% in bed, but not bedbound (Capable of only limited self-care, confined to bed or chair 50% or more of waking hours)  4 - Bedbound (Completely disabled. Cannot carry on any self-care. Totally confined to bed or chair)  5 - Death   Santiago Glad MM, Creech RH, Tormey DC, et al. [phone]). "Toxicity and response criteria of the [facility]". Am. Evlyn Clines. Oncol. 5 (6): 649-55    LABORATORY DATA:  Lab Results  Component Value Date   WBC 6.5 04/03/2023   HGB 15.6 04/22/2023   HCT 46.0 04/22/2023   MCV 80.2 04/03/2023   PLT 219 04/03/2023   Lab Results  Component Value Date   NA 127 (L) 04/22/2023   K 4.2 04/22/2023   CL 95 (L) 04/22/2023   CO2 22 04/03/2023   Lab Results  Component Value Date   ALT 31 08/14/2019   AST 22 08/14/2019   ALKPHOS 61 08/14/2019   BILITOT 0.4 08/14/2019      RADIOGRAPHY: No results found.     IMPRESSION/PLAN: 1. Gliboblastoma, IDH-wildtype, CNS WHO grade 4; s/p surgical resection  Randall Lewis has recovered well from his surgery on 04/18/2023. Post-op MRI demonstrates a favorable response to surgery. Given the time that has elapsed since surgery, the [facility] team has recommended a repeat brain MRI. This has been scheduled for 05/26/2023. We will review the final  results prior to proceeding with treatment planning. Dr. Mitzi Hansen has reviewed the patient's case and recommends radiation therapy to the resection cavity in combination with concurrent Temozolamide.   Today, I talked to the patient and family about the findings and work-up thus far.  We discussed the natural history of gliomas and general treatment, highlighting the role of radiotherapy in the management.  We discussed the available radiation techniques, and focused on the details of logistics and delivery.  We reviewed the anticipated acute and late sequelae associated with radiation in this setting.  The patient was encouraged to ask questions that I answered to the best of my ability.  A patient consent form was discussed and signed.  We retained a copy for our records.  The patient would like to proceed with radiation and will be scheduled for CT simulation.  CT simulation has been scheduled for 05/23/2023 with plans to start radiation on 05/30/2023. Anticipate 6 weeks of radiotherapy.  We look forward to participating in this patient's care.   In a visit lasting 60 minutes, greater than 50% of the time was spent face to face discussing the patient's condition, in preparation for the discussion, and coordinating the patient's care.   The above documentation reflects my direct findings during this shared patient visit. Please see the separate note by Dr. Mitzi Hansen on this date for the remainder of the patient's plan of care.    Joyice Faster, PA-C    **Disclaimer: This note was dictated with voice recognition software. Similar sounding words can inadvertently be transcribed and this note may contain transcription errors which may not have been corrected upon publication of note.**

## 2023-05-20 NOTE — Telephone Encounter (Signed)
Oral Oncology Patient Advocate Encounter  After completing a benefits investigation, prior authorization for temozolomide is not required at this time through [employer].  Patient's copay is $32 per each 42 day supply of treatment.   Total cost for cycle with both strengths needed will be $64.     Jinger Neighbors, CPhT-Adv Oncology Pharmacy Patient Advocate [facility] Direct Number: [phone]  Fax: [phone]

## 2023-05-20 NOTE — Telephone Encounter (Signed)
Oral Chemotherapy Pharmacist Encounter  I spoke with patient for overview of: Temodar for the treatment of glioblastoma multiforme in conjunction with radiation, planned duration concomitant phase 42 days of therapy.  Patient will likely continue on Temodar for maintenance treatment for 6-12 cycles after completion of concomitant phase.  Counseled patient on administration, dosing, side effects, monitoring, drug-food interactions, safe handling, storage, and disposal.  Patient will take Temodar 140mg  capsules and Temodar 20mg  capsules, 160 mg total daily dose, by mouth once daily, may take at bedtime and on an empty stomach to decrease nausea and vomiting. Prescription dose and frequency assessed for appropriateness. Discussed with Dr. Barbaraann Cao and we will continue with 160mg  dose.  Patient will take Temodar concurrent with radiation for 42 days straight.  Temodar and radiation start date: pending, possibly 12/30  Adverse effects include but are not limited to: nausea, vomiting, anorexia, GI upset, rash, drug fever, and fatigue.  Rare but serious adverse effects of pneumocystis pneumonia and secondary malignancy also discussed. PCP prophylaxis will not be initiated at this time, but may be added based on lymphocyte count in the future.  Prophylactic Zofran will not be used at initiation of concurrent phase, but will be initiated if nausea develops despite Temodar administration on an empty stomach and at bedtime. If this occurs, patient will take Zofran 8 mg tablet, 1 tablet by mouth 30-60 min prior to Temodar dose to help decrease N/V   Reviewed with patient importance of keeping a medication schedule and plan for any missed doses. Evaluated chart and no patient barriers to medication adherence noted.  Patient agreement for treatment documented in MD note on 12/19/20247.  Labs from 04/19/2023 assessed, no interventions needed. Medication reconciliation performed and medication/allergy list  updated. Current medication list in Epic reviewed, no significant/ relevant DDIs with Temodar identified.   Insurance authorization for Temodar has been obtained. Patient will pick up medication next week from [facility].   Patient informed the pharmacy will reach out 5-7 days prior to needing next fill of Temodar to coordinate continued medication acquisition to prevent break in therapy.   All questions answered. Patient voiced understanding and appreciation.  Medication education handout placed in mail for patient. Patient knows to call the office with questions or concerns. Oral Chemotherapy Clinic phone number provided to patient.   Bethel Born, PharmD Hematology/Oncology Clinical Pharmacist [facility] Oral Chemotherapy Navigation [facility] [phone] 05/20/2023  1:58 PM

## 2023-05-20 NOTE — Progress Notes (Signed)
Specialty Pharmacy Initial Fill Coordination Note  [name] is a 67 y.o. male contacted today regarding refills of specialty medication(s) Temozolomide (TEMODAR) .  Patient requested Randall [facility] at [facility] [place]  on 05/23/23   Medication will be filled on 05/23/23.   Patient is aware of $64 copayment.

## 2023-05-23 ENCOUNTER — Other Ambulatory Visit: Payer: Self-pay | Admitting: Radiation Oncology

## 2023-05-23 ENCOUNTER — Telehealth: Payer: Self-pay | Admitting: *Deleted

## 2023-05-23 ENCOUNTER — Other Ambulatory Visit: Payer: Self-pay

## 2023-05-23 ENCOUNTER — Ambulatory Visit: Payer: Medicare PPO | Admitting: Radiation Oncology

## 2023-05-23 ENCOUNTER — Other Ambulatory Visit (HOSPITAL_COMMUNITY): Payer: Self-pay

## 2023-05-23 DIAGNOSIS — C712 Malignant neoplasm of temporal lobe: Secondary | ICD-10-CM

## 2023-05-23 NOTE — Telephone Encounter (Signed)
CALLED PATIENT TO ASK ABOUT COMING IN FOR LABS TOMORROW (05-24-23 @ 8 AM), SPOKE WITH PATIENT AND HE AGREED TO COME FOR THIS

## 2023-05-24 ENCOUNTER — Other Ambulatory Visit (HOSPITAL_COMMUNITY): Payer: Self-pay

## 2023-05-24 ENCOUNTER — Ambulatory Visit (HOSPITAL_COMMUNITY)
Admission: RE | Admit: 2023-05-24 | Discharge: 2023-05-24 | Disposition: A | Discharge status: Home / Self Care | Payer: Medicare PPO | Source: Ambulatory Visit | Attending: Internal Medicine | Admitting: Internal Medicine

## 2023-05-24 ENCOUNTER — Ambulatory Visit
Admission: RE | Admit: 2023-05-24 | Discharge: 2023-05-24 | Discharge status: Home / Self Care | Payer: Medicare PPO | Source: Ambulatory Visit | Attending: Radiation Oncology | Admitting: Radiation Oncology

## 2023-05-24 ENCOUNTER — Ambulatory Visit
Admission: RE | Admit: 2023-05-24 | Discharge: 2023-05-24 | Disposition: A | Discharge status: Home / Self Care | Payer: Medicare PPO | Source: Ambulatory Visit | Attending: Radiation Oncology | Admitting: Radiation Oncology

## 2023-05-24 VITALS — BP 160/86 | HR 67 | Temp 97.8°F | Resp 18 | Ht 71.0 in | Wt 234.6 lb

## 2023-05-24 DIAGNOSIS — C719 Malignant neoplasm of brain, unspecified: Secondary | ICD-10-CM | POA: Insufficient documentation

## 2023-05-24 DIAGNOSIS — C711 Malignant neoplasm of frontal lobe: Secondary | ICD-10-CM

## 2023-05-24 DIAGNOSIS — C712 Malignant neoplasm of temporal lobe: Secondary | ICD-10-CM

## 2023-05-24 DIAGNOSIS — Z510 Encounter for antineoplastic radiation therapy: Secondary | ICD-10-CM | POA: Diagnosis not present

## 2023-05-24 DIAGNOSIS — G9389 Other specified disorders of brain: Secondary | ICD-10-CM | POA: Diagnosis not present

## 2023-05-24 LAB — CMP (CANCER CENTER ONLY)
ALT: 18 U/L (ref 0–44)
AST: 12 U/L — ABNORMAL LOW (ref 15–41)
Albumin: 4.1 g/dL (ref 3.5–5.0)
Alkaline Phosphatase: 55 U/L (ref 38–126)
Anion gap: 10 (ref 5–15)
BUN: 16 mg/dL (ref 8–23)
CO2: 25 mmol/L (ref 22–32)
Calcium: 9.3 mg/dL (ref 8.9–10.3)
Chloride: 103 mmol/L (ref 98–111)
Creatinine: 1.16 mg/dL (ref 0.61–1.24)
GFR, Estimated: >60 mL/min (ref >60)
Glucose, Bld: 303 mg/dL — ABNORMAL HIGH (ref 70–99)
Potassium: 4.4 mmol/L (ref 3.5–5.1)
Sodium: 138 mmol/L (ref 135–145)
Total Bilirubin: 0.6 mg/dL (ref <1.2)
Total Protein: 6.9 g/dL (ref 6.5–8.1)

## 2023-05-24 MED ORDER — GADOBUTROL 1 MMOL/ML IV SOLN
10.0000 mL | Freq: Once | INTRAVENOUS | Status: AC | PRN
  Administered 2023-05-24: 10 mL via INTRAVENOUS

## 2023-05-24 NOTE — Progress Notes (Signed)
Has armband been applied?  yes  Does patient have an allergy to IV contrast dye?:  No, patient has allergy to MRI contrast   Has patient ever received premedication for IV contrast dye?:  No  Does patient take metformin?: Yes  If patient does take metformin when was the last dose: 05/24/23  Date of lab work: 05/24/2023 BUN: 16 CR: 1.16 eGfr: >60  IV site: left hand  Has IV site been added to flowsheet?    BP (!) 160/86 (BP Location: Left Arm, Patient Position: Sitting, Cuff Size: Large)   Pulse 67   Temp 97.8 F (36.6 C)   Resp 18   Ht 5\' 11"  (1.803 m)   Wt 234 lb 9.6 oz (106.4 kg)   SpO2 99%   BMI 32.72 kg/m

## 2023-05-26 ENCOUNTER — Ambulatory Visit: Payer: Medicare PPO | Admitting: Radiation Oncology

## 2023-05-26 DIAGNOSIS — Z510 Encounter for antineoplastic radiation therapy: Secondary | ICD-10-CM | POA: Diagnosis not present

## 2023-05-26 DIAGNOSIS — C712 Malignant neoplasm of temporal lobe: Secondary | ICD-10-CM | POA: Diagnosis not present

## 2023-05-27 ENCOUNTER — Ambulatory Visit: Payer: Medicare PPO

## 2023-05-30 ENCOUNTER — Ambulatory Visit
Admission: RE | Admit: 2023-05-30 | Discharge: 2023-05-30 | Disposition: A | Discharge status: Home / Self Care | Payer: Medicare PPO | Source: Ambulatory Visit | Attending: Radiation Oncology | Admitting: Radiation Oncology

## 2023-05-30 ENCOUNTER — Other Ambulatory Visit: Payer: Self-pay

## 2023-05-30 DIAGNOSIS — C712 Malignant neoplasm of temporal lobe: Secondary | ICD-10-CM | POA: Diagnosis not present

## 2023-05-30 DIAGNOSIS — Z510 Encounter for antineoplastic radiation therapy: Secondary | ICD-10-CM | POA: Diagnosis not present

## 2023-05-30 LAB — RAD ONC ARIA SESSION SUMMARY
Course Elapsed Days: 0
Plan Fractions Treated to Date: 1
Plan Prescribed Dose Per Fraction: 2 Gy
Plan Total Fractions Prescribed: 23
Plan Total Prescribed Dose: 46 Gy
Reference Point Dosage Given to Date: 2 Gy
Reference Point Session Dosage Given: 2 Gy
Session Number: 1

## 2023-05-31 ENCOUNTER — Ambulatory Visit
Admission: RE | Admit: 2023-05-31 | Discharge: 2023-05-31 | Disposition: A | Discharge status: Home / Self Care | Payer: Medicare PPO | Source: Ambulatory Visit | Attending: Radiation Oncology

## 2023-05-31 ENCOUNTER — Other Ambulatory Visit: Payer: Self-pay

## 2023-05-31 DIAGNOSIS — Z510 Encounter for antineoplastic radiation therapy: Secondary | ICD-10-CM | POA: Diagnosis not present

## 2023-05-31 DIAGNOSIS — C712 Malignant neoplasm of temporal lobe: Secondary | ICD-10-CM | POA: Diagnosis not present

## 2023-05-31 LAB — RAD ONC ARIA SESSION SUMMARY
Course Elapsed Days: 1
Plan Fractions Treated to Date: 2
Plan Prescribed Dose Per Fraction: 2 Gy
Plan Total Fractions Prescribed: 23
Plan Total Prescribed Dose: 46 Gy
Reference Point Dosage Given to Date: 4 Gy
Reference Point Session Dosage Given: 2 Gy
Session Number: 2

## 2023-06-02 ENCOUNTER — Ambulatory Visit
Admission: RE | Admit: 2023-06-02 | Discharge: 2023-06-02 | Disposition: A | Discharge status: Home / Self Care | Payer: Medicare PPO | Source: Ambulatory Visit | Attending: Radiation Oncology | Admitting: Radiation Oncology

## 2023-06-02 ENCOUNTER — Other Ambulatory Visit: Payer: Self-pay

## 2023-06-02 DIAGNOSIS — Z87891 Personal history of nicotine dependence: Secondary | ICD-10-CM | POA: Diagnosis not present

## 2023-06-02 DIAGNOSIS — Z510 Encounter for antineoplastic radiation therapy: Secondary | ICD-10-CM | POA: Diagnosis not present

## 2023-06-02 DIAGNOSIS — C711 Malignant neoplasm of frontal lobe: Secondary | ICD-10-CM | POA: Diagnosis not present

## 2023-06-02 DIAGNOSIS — Z803 Family history of malignant neoplasm of breast: Secondary | ICD-10-CM | POA: Insufficient documentation

## 2023-06-02 DIAGNOSIS — C712 Malignant neoplasm of temporal lobe: Secondary | ICD-10-CM | POA: Insufficient documentation

## 2023-06-02 LAB — RAD ONC ARIA SESSION SUMMARY
Course Elapsed Days: 3
Plan Fractions Treated to Date: 3
Plan Prescribed Dose Per Fraction: 2 Gy
Plan Total Fractions Prescribed: 23
Plan Total Prescribed Dose: 46 Gy
Reference Point Dosage Given to Date: 6 Gy
Reference Point Session Dosage Given: 2 Gy
Session Number: 3

## 2023-06-03 ENCOUNTER — Other Ambulatory Visit: Payer: Self-pay

## 2023-06-03 ENCOUNTER — Ambulatory Visit
Admission: RE | Admit: 2023-06-03 | Discharge: 2023-06-03 | Disposition: A | Discharge status: Home / Self Care | Payer: Medicare PPO | Source: Ambulatory Visit | Attending: Radiation Oncology

## 2023-06-03 ENCOUNTER — Ambulatory Visit
Admission: RE | Admit: 2023-06-03 | Discharge: 2023-06-03 | Disposition: A | Discharge status: Home / Self Care | Payer: Medicare PPO | Source: Ambulatory Visit | Attending: Radiation Oncology | Admitting: Radiation Oncology

## 2023-06-03 DIAGNOSIS — Z803 Family history of malignant neoplasm of breast: Secondary | ICD-10-CM | POA: Diagnosis not present

## 2023-06-03 DIAGNOSIS — Z87891 Personal history of nicotine dependence: Secondary | ICD-10-CM | POA: Diagnosis not present

## 2023-06-03 DIAGNOSIS — Z510 Encounter for antineoplastic radiation therapy: Secondary | ICD-10-CM | POA: Diagnosis not present

## 2023-06-03 DIAGNOSIS — C711 Malignant neoplasm of frontal lobe: Secondary | ICD-10-CM | POA: Diagnosis not present

## 2023-06-03 DIAGNOSIS — C712 Malignant neoplasm of temporal lobe: Secondary | ICD-10-CM

## 2023-06-03 LAB — RAD ONC ARIA SESSION SUMMARY
Course Elapsed Days: 4
Plan Fractions Treated to Date: 4
Plan Prescribed Dose Per Fraction: 2 Gy
Plan Total Fractions Prescribed: 23
Plan Total Prescribed Dose: 46 Gy
Reference Point Dosage Given to Date: 8 Gy
Reference Point Session Dosage Given: 2 Gy
Session Number: 4

## 2023-06-03 MED ORDER — SONAFINE EX EMUL
1.0000 | Freq: Two times a day (BID) | CUTANEOUS | Status: DC
Start: 2023-06-03 — End: 2023-06-04
  Administered 2023-06-03: 1 via TOPICAL

## 2023-06-06 ENCOUNTER — Telehealth: Payer: Self-pay

## 2023-06-06 ENCOUNTER — Encounter: Payer: Self-pay | Admitting: Internal Medicine

## 2023-06-06 ENCOUNTER — Ambulatory Visit
Admission: RE | Admit: 2023-06-06 | Discharge: 2023-06-06 | Disposition: A | Discharge status: Home / Self Care | Payer: Medicare PPO | Source: Ambulatory Visit | Attending: Radiation Oncology | Admitting: Radiation Oncology

## 2023-06-06 ENCOUNTER — Other Ambulatory Visit: Payer: Self-pay

## 2023-06-06 DIAGNOSIS — Z803 Family history of malignant neoplasm of breast: Secondary | ICD-10-CM | POA: Diagnosis not present

## 2023-06-06 DIAGNOSIS — C711 Malignant neoplasm of frontal lobe: Secondary | ICD-10-CM | POA: Diagnosis not present

## 2023-06-06 DIAGNOSIS — Z87891 Personal history of nicotine dependence: Secondary | ICD-10-CM | POA: Diagnosis not present

## 2023-06-06 DIAGNOSIS — C719 Malignant neoplasm of brain, unspecified: Secondary | ICD-10-CM

## 2023-06-06 DIAGNOSIS — Z510 Encounter for antineoplastic radiation therapy: Secondary | ICD-10-CM | POA: Diagnosis not present

## 2023-06-06 DIAGNOSIS — C712 Malignant neoplasm of temporal lobe: Secondary | ICD-10-CM | POA: Diagnosis not present

## 2023-06-06 LAB — RAD ONC ARIA SESSION SUMMARY
Course Elapsed Days: 7
Plan Fractions Treated to Date: 5
Plan Prescribed Dose Per Fraction: 2 Gy
Plan Total Fractions Prescribed: 23
Plan Total Prescribed Dose: 46 Gy
Reference Point Dosage Given to Date: 10 Gy
Reference Point Session Dosage Given: 2 Gy
Session Number: 5

## 2023-06-06 NOTE — Telephone Encounter (Signed)
 Called patient and made him aware of what Dr. Buckley stated he was ok to start taking the med as prescibed per the message below.  Jalaina Salyers- he can start taking 140+20, no need to make up for the missed doses.   Zachary K Vaslow, MD     GM Dr. Buckley, For the first eight (8) days of taking the chemo pills I mistakenly was only taking the 20 mg capsule.  I was not aware until today that I was supposed to take the 140 mg capsule along with the 20 mg.  I will take both henceforth but what should I do to make up for what I have missed?  Please let me know.  Thank you,   Randall Lewis, 16-Feb-1956

## 2023-06-07 ENCOUNTER — Ambulatory Visit
Admission: RE | Admit: 2023-06-07 | Discharge: 2023-06-07 | Disposition: A | Discharge status: Home / Self Care | Payer: Medicare PPO | Source: Ambulatory Visit | Attending: Radiation Oncology

## 2023-06-07 ENCOUNTER — Other Ambulatory Visit: Payer: Self-pay

## 2023-06-07 DIAGNOSIS — Z87891 Personal history of nicotine dependence: Secondary | ICD-10-CM | POA: Diagnosis not present

## 2023-06-07 DIAGNOSIS — Z803 Family history of malignant neoplasm of breast: Secondary | ICD-10-CM | POA: Diagnosis not present

## 2023-06-07 DIAGNOSIS — C712 Malignant neoplasm of temporal lobe: Secondary | ICD-10-CM | POA: Diagnosis not present

## 2023-06-07 DIAGNOSIS — C711 Malignant neoplasm of frontal lobe: Secondary | ICD-10-CM | POA: Diagnosis not present

## 2023-06-07 DIAGNOSIS — Z510 Encounter for antineoplastic radiation therapy: Secondary | ICD-10-CM | POA: Diagnosis not present

## 2023-06-07 LAB — RAD ONC ARIA SESSION SUMMARY
Course Elapsed Days: 8
Plan Fractions Treated to Date: 6
Plan Prescribed Dose Per Fraction: 2 Gy
Plan Total Fractions Prescribed: 23
Plan Total Prescribed Dose: 46 Gy
Reference Point Dosage Given to Date: 12 Gy
Reference Point Session Dosage Given: 2 Gy
Session Number: 6

## 2023-06-08 ENCOUNTER — Other Ambulatory Visit: Payer: Self-pay

## 2023-06-08 ENCOUNTER — Ambulatory Visit
Admission: RE | Admit: 2023-06-08 | Discharge: 2023-06-08 | Disposition: A | Discharge status: Home / Self Care | Payer: Medicare PPO | Source: Ambulatory Visit | Attending: Radiation Oncology | Admitting: Radiation Oncology

## 2023-06-08 DIAGNOSIS — C712 Malignant neoplasm of temporal lobe: Secondary | ICD-10-CM | POA: Diagnosis not present

## 2023-06-08 DIAGNOSIS — Z87891 Personal history of nicotine dependence: Secondary | ICD-10-CM | POA: Diagnosis not present

## 2023-06-08 DIAGNOSIS — C711 Malignant neoplasm of frontal lobe: Secondary | ICD-10-CM | POA: Diagnosis not present

## 2023-06-08 DIAGNOSIS — R339 Retention of urine, unspecified: Secondary | ICD-10-CM | POA: Diagnosis not present

## 2023-06-08 DIAGNOSIS — Z510 Encounter for antineoplastic radiation therapy: Secondary | ICD-10-CM | POA: Diagnosis not present

## 2023-06-08 DIAGNOSIS — Z803 Family history of malignant neoplasm of breast: Secondary | ICD-10-CM | POA: Diagnosis not present

## 2023-06-08 LAB — RAD ONC ARIA SESSION SUMMARY
Course Elapsed Days: 9
Plan Fractions Treated to Date: 7
Plan Prescribed Dose Per Fraction: 2 Gy
Plan Total Fractions Prescribed: 23
Plan Total Prescribed Dose: 46 Gy
Reference Point Dosage Given to Date: 14 Gy
Reference Point Session Dosage Given: 2 Gy
Session Number: 7

## 2023-06-09 ENCOUNTER — Other Ambulatory Visit: Payer: Self-pay

## 2023-06-09 ENCOUNTER — Ambulatory Visit
Admission: RE | Admit: 2023-06-09 | Discharge: 2023-06-09 | Disposition: A | Discharge status: Home / Self Care | Payer: Medicare PPO | Source: Ambulatory Visit | Attending: Radiation Oncology | Admitting: Radiation Oncology

## 2023-06-09 DIAGNOSIS — Z510 Encounter for antineoplastic radiation therapy: Secondary | ICD-10-CM | POA: Diagnosis not present

## 2023-06-09 DIAGNOSIS — C712 Malignant neoplasm of temporal lobe: Secondary | ICD-10-CM | POA: Diagnosis not present

## 2023-06-09 DIAGNOSIS — Z87891 Personal history of nicotine dependence: Secondary | ICD-10-CM | POA: Diagnosis not present

## 2023-06-09 DIAGNOSIS — C711 Malignant neoplasm of frontal lobe: Secondary | ICD-10-CM | POA: Diagnosis not present

## 2023-06-09 DIAGNOSIS — Z803 Family history of malignant neoplasm of breast: Secondary | ICD-10-CM | POA: Diagnosis not present

## 2023-06-09 LAB — RAD ONC ARIA SESSION SUMMARY
Course Elapsed Days: 10
Plan Fractions Treated to Date: 8
Plan Prescribed Dose Per Fraction: 2 Gy
Plan Total Fractions Prescribed: 23
Plan Total Prescribed Dose: 46 Gy
Reference Point Dosage Given to Date: 16 Gy
Reference Point Session Dosage Given: 2 Gy
Session Number: 8

## 2023-06-10 ENCOUNTER — Ambulatory Visit
Admission: RE | Admit: 2023-06-10 | Discharge: 2023-06-10 | Disposition: A | Discharge status: Home / Self Care | Payer: Medicare PPO | Source: Ambulatory Visit | Attending: Radiation Oncology | Admitting: Radiation Oncology

## 2023-06-10 ENCOUNTER — Other Ambulatory Visit: Payer: Self-pay

## 2023-06-10 DIAGNOSIS — C711 Malignant neoplasm of frontal lobe: Secondary | ICD-10-CM | POA: Diagnosis not present

## 2023-06-10 DIAGNOSIS — Z87891 Personal history of nicotine dependence: Secondary | ICD-10-CM | POA: Diagnosis not present

## 2023-06-10 DIAGNOSIS — C712 Malignant neoplasm of temporal lobe: Secondary | ICD-10-CM | POA: Diagnosis not present

## 2023-06-10 DIAGNOSIS — Z803 Family history of malignant neoplasm of breast: Secondary | ICD-10-CM | POA: Diagnosis not present

## 2023-06-10 DIAGNOSIS — Z510 Encounter for antineoplastic radiation therapy: Secondary | ICD-10-CM | POA: Diagnosis not present

## 2023-06-10 LAB — RAD ONC ARIA SESSION SUMMARY
Course Elapsed Days: 11
Plan Fractions Treated to Date: 9
Plan Prescribed Dose Per Fraction: 2 Gy
Plan Total Fractions Prescribed: 23
Plan Total Prescribed Dose: 46 Gy
Reference Point Dosage Given to Date: 18 Gy
Reference Point Session Dosage Given: 2 Gy
Session Number: 9

## 2023-06-12 DIAGNOSIS — G4733 Obstructive sleep apnea (adult) (pediatric): Secondary | ICD-10-CM | POA: Diagnosis not present

## 2023-06-13 ENCOUNTER — Inpatient Hospital Stay: Payer: Medicare PPO | Attending: Internal Medicine

## 2023-06-13 ENCOUNTER — Ambulatory Visit
Admission: RE | Admit: 2023-06-13 | Discharge: 2023-06-13 | Disposition: A | Discharge status: Home / Self Care | Payer: Medicare PPO | Source: Ambulatory Visit | Attending: Radiation Oncology | Admitting: Radiation Oncology

## 2023-06-13 ENCOUNTER — Other Ambulatory Visit: Payer: Self-pay

## 2023-06-13 ENCOUNTER — Inpatient Hospital Stay (HOSPITAL_BASED_OUTPATIENT_CLINIC_OR_DEPARTMENT_OTHER): Payer: Medicare PPO | Admitting: Internal Medicine

## 2023-06-13 VITALS — BP 122/67 | HR 61 | Temp 97.9°F | Resp 13 | Wt 233.7 lb

## 2023-06-13 DIAGNOSIS — Z803 Family history of malignant neoplasm of breast: Secondary | ICD-10-CM | POA: Diagnosis not present

## 2023-06-13 DIAGNOSIS — C719 Malignant neoplasm of brain, unspecified: Secondary | ICD-10-CM | POA: Diagnosis not present

## 2023-06-13 DIAGNOSIS — Z87891 Personal history of nicotine dependence: Secondary | ICD-10-CM | POA: Insufficient documentation

## 2023-06-13 DIAGNOSIS — C711 Malignant neoplasm of frontal lobe: Secondary | ICD-10-CM | POA: Insufficient documentation

## 2023-06-13 DIAGNOSIS — C712 Malignant neoplasm of temporal lobe: Secondary | ICD-10-CM | POA: Diagnosis not present

## 2023-06-13 DIAGNOSIS — Z510 Encounter for antineoplastic radiation therapy: Secondary | ICD-10-CM | POA: Diagnosis not present

## 2023-06-13 LAB — RAD ONC ARIA SESSION SUMMARY
Course Elapsed Days: 14
Plan Fractions Treated to Date: 10
Plan Prescribed Dose Per Fraction: 2 Gy
Plan Total Fractions Prescribed: 23
Plan Total Prescribed Dose: 46 Gy
Reference Point Dosage Given to Date: 20 Gy
Reference Point Session Dosage Given: 2 Gy
Session Number: 10

## 2023-06-13 LAB — CBC WITH DIFFERENTIAL (CANCER CENTER ONLY)
Abs Immature Granulocytes: 0.01 10*3/uL (ref 0.00–0.07)
Basophils Absolute: 0 10*3/uL (ref 0.0–0.1)
Basophils Relative: 1 %
Eosinophils Absolute: 0 10*3/uL (ref 0.0–0.5)
Eosinophils Relative: 1 %
HCT: 45.1 % (ref 39.0–52.0)
Hemoglobin: 14.5 g/dL (ref 13.0–17.0)
Immature Granulocytes: 0 %
Lymphocytes Relative: 30 %
Lymphs Abs: 1.5 10*3/uL (ref 0.7–4.0)
MCH: 25.8 pg — ABNORMAL LOW (ref 26.0–34.0)
MCHC: 32.2 g/dL (ref 30.0–36.0)
MCV: 80.2 fL (ref 80.0–100.0)
Monocytes Absolute: 0.3 10*3/uL (ref 0.1–1.0)
Monocytes Relative: 6 %
Neutro Abs: 3.1 10*3/uL (ref 1.7–7.7)
Neutrophils Relative %: 62 %
Platelet Count: 253 10*3/uL (ref 150–400)
RBC: 5.62 MIL/uL (ref 4.22–5.81)
RDW: 15.7 % — ABNORMAL HIGH (ref 11.5–15.5)
WBC Count: 4.9 10*3/uL (ref 4.0–10.5)
nRBC: 0 % (ref 0.0–0.2)

## 2023-06-13 LAB — CMP (CANCER CENTER ONLY)
ALT: 20 U/L (ref 0–44)
AST: 15 U/L (ref 15–41)
Albumin: 4.1 g/dL (ref 3.5–5.0)
Alkaline Phosphatase: 52 U/L (ref 38–126)
Anion gap: 6 (ref 5–15)
BUN: 20 mg/dL (ref 8–23)
CO2: 30 mmol/L (ref 22–32)
Calcium: 9.4 mg/dL (ref 8.9–10.3)
Chloride: 100 mmol/L (ref 98–111)
Creatinine: 1.25 mg/dL — ABNORMAL HIGH (ref 0.61–1.24)
GFR, Estimated: >60 mL/min (ref >60)
Glucose, Bld: 213 mg/dL — ABNORMAL HIGH (ref 70–99)
Potassium: 4 mmol/L (ref 3.5–5.1)
Sodium: 136 mmol/L (ref 135–145)
Total Bilirubin: 0.6 mg/dL (ref 0.0–1.2)
Total Protein: 6.9 g/dL (ref 6.5–8.1)

## 2023-06-13 NOTE — Progress Notes (Signed)
 [facility] at [facility] [address]  [place], [place] [address] [phone]   Interval Evaluation  Date of Service: 06/13/23 Patient Name: Randall Lewis Patient MRN: [id number] Patient DOB: 20-Jun-1955 Provider: Arthea MARLA Manns, MD  Identifying Statement:  Randall Lewis is a 68 y.o. male with right frontal glioblastoma    Oncologic History: Anaplastic oligodendroglioma of frontal lobe (CMS/HHS-HCC)  04/16/2005 Surgery  Craniotomy for resection of left frontal lesion. 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 Lost to follow up after 03/18/2010  05/02/2023 Presentation  Presentation to the [facility] at [facility]. Recommendations pending final pathology. Likely XRT/Temozolomide  as this is a high grade glioma. 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 Pathology reveals Infiltrating High Grade Glioma pending final molecular studies for integrated diagnosis.  Molecular data (see molecular reports for more details): IDH mutation is NOT detected (PGDx elio Solid Tumor NGS Panel) H3 mutation is NOT detected (PGDx elio Solid Tumor NGS Panel) 1p/19q codeletion is NOT detected (chromosomal microarray) Chromothripsis of chromosomes 7 and 13 is detected (chromosomal microarray) Chromosome 10 loss with PTEN loss is detected (chromosomal microarray) Chromosome 9  segmental loss with homozygous loss of CDKN2A/B is detected (chromosomal microarray) TERT promoter mutation is detected (PGDx elio Solid Tumor NGS Panel) EGFRvIII is detected (PGDx elio Solid Tumor NGS Panel) EGFR amplification is detected (chromosomal microarray and PGDx elio Solid Tumor NGS Panel) MDM2 amplification is detected (chromosomal microarray and PGDx elio Solid Tumor NGS Panel)    Interval History: Randall Lewis presents today for follow up, now having completed 2 weeks of IMRT and Temodar .  No issues  tolerating treatment thus far.  No headaches or seizures.    H+P (05/19/23) Patient presents for follow up after craniotomy, resection of recurrent primary brain tumor at [facility] on 04/18/23.  He tolerated surgery well, no ill effects.  He is functionally intact, no further headaches, no seizures.  Recently increased blood pressure medication with his PCP.  Has visit with radiation oncology upcoming as well.  Medications: Current Outpatient Medications on File Prior to Visit  Medication Sig Dispense Refill   JARDIANCE 25 MG TABS tablet Take 25 mg by mouth daily.     acetaminophen  (TYLENOL ) 325 MG tablet Take 975 mg by mouth every 6 (six) hours as needed.     amLODipine  (NORVASC ) 5 MG tablet Take 5 mg by mouth daily.     buPROPion  (WELLBUTRIN  SR) 100 MG 12 hr tablet Take 100 mg by mouth every morning.     losartan -hydrochlorothiazide  (HYZAAR) 100-12.5 MG tablet Take 1 tablet by mouth daily.     metFORMIN (GLUCOPHAGE-XR) 500 MG 24 hr tablet Take 1,000 mg by mouth daily.     metoprolol  tartrate (LOPRESSOR ) 12.5 mg TABS tablet Take 12.5 mg by mouth daily.     ondansetron  (ZOFRAN ) 8 MG tablet Take 1 tablet (8 mg total) by mouth every 8 (eight) hours as needed for nausea or vomiting. May take 30-60 minutes prior to Temodar  administration if  nausea/vomiting occurs as needed. 30 tablet 1   oxyCODONE  (OXY IR/ROXICODONE ) 5 MG immediate release tablet Take 5 mg by mouth every 4 (four) hours as  needed.     pioglitazone (ACTOS) 15 MG tablet Take 15 mg by mouth daily.     simvastatin  (ZOCOR ) 20 MG tablet Take 20 mg by mouth daily.     tamsulosin  (FLOMAX ) 0.4 MG CAPS capsule Take 0.4 mg by mouth daily.     temozolomide  (TEMODAR ) 140 MG capsule Take 1 capsule (140 mg total) by mouth daily. May take on an empty stomach to decrease nausea & vomiting. 42 capsule 0   temozolomide  (TEMODAR ) 20 MG capsule Take 1 capsule (20 mg total) by mouth daily. May take on an empty stomach to decrease nausea & vomiting. 42 capsule 0   No current facility-administered medications on file prior to visit.    Allergies:  Allergies  Allergen Reactions   Sildenafil Other (See Comments) and Nausea Only   Gadolinium Derivatives Nausea And Vomiting    Per pt , always get sick with gado and never offered anti nausea meds.   Past Medical History:  Past Medical History:  Diagnosis Date   BPH (benign prostatic hyperplasia)    Cancer (HCC) 2006   Brain tumor, in remission after chemo radiation and surgery   Diabetes mellitus without complication (HCC)    ED (erectile dysfunction)    Heart murmur    History of heart murmur   Hyperlipidemia    Hypertension    Sleep apnea    USES C-PAP   Past Surgical History:  Past Surgical History:  Procedure Laterality Date   BRAIN SURGERY  2006   BRAIN TUMOR  - CANCER   COLONOSCOPY     CYSTOSCOPY N/A 07/29/2015   Procedure: FLEXIBLE CYSTOSCOPY;  Surgeon: Norleen Seltzer, MD;  Location: [facility];  Service: Urology;  Laterality: N/A;   FOOT BONE EXCISION  1989   PENILE PROSTHESIS IMPLANT N/A 07/29/2015   Procedure: PENILE PROSTHESIS;  Surgeon: Norleen Seltzer, MD;  Location: [facility];  Service: Urology;  Laterality: N/A;   right knee meniscus repair  2010   THYROID  LOBECTOMY  05/12/2012   Procedure: THYROID  LOBECTOMY;  Surgeon: Krystal CHRISTELLA Spinner, MD;  Location: [facility];  Service: General;  Laterality: Left;  left thyroid  lobectomy   Social History:  Social History   Socioeconomic  History   Marital status: Married    Spouse name: [name]   Number of children: 2   Years of education: Not on file   Highest education level: Master's degree (e.g., MA, MS, MEng, MEd, MSW, MBA)  Occupational History   Not on file  Tobacco Use   Smoking status: Former    Current packs/day: 0.00    Average packs/day: 1 pack/day for 23.0 years (23.0 ttl pk-yrs)    Types: Cigarettes    Start date: 04/26/1973    Quit date: 04/26/1996    Years since quitting: 27.1   Smokeless tobacco: Never  Substance and Sexual Activity   Alcohol use: No   Drug use: No   Sexual activity: Not on file  Other Topics Concern   Not on file  Social History Narrative   Lives with wife   Right handed   Caffeine: 2 cups of coffee and 1 glass of tea/soda a day   Social Drivers of Corporate Investment Banker Strain: Low Risk  (05/01/2023)   Received from [facility]   Overall Financial Resource Strain (CARDIA)  Difficulty of Paying Living Expenses: Not hard at all  Food Insecurity: No Food Insecurity (05/20/2023)   Hunger Vital Sign    Worried About Running Out of Food in the Last Year: Never true    Ran Out of Food in the Last Year: Never true  Transportation Needs: No Transportation Needs (05/20/2023)   PRAPARE - Administrator, Civil Service (Medical): No    Lack of Transportation (Non-Medical): No  Physical Activity: Not on file  Stress: Not on file  Social Connections: Not on file  Intimate Partner Violence: Not At Risk (05/20/2023)   Humiliation, Afraid, Rape, and Kick questionnaire    Fear of Current or Ex-Partner: No    Emotionally Abused: No    Physically Abused: No    Sexually Abused: No   Family History:  Family History  Problem Relation Age of Onset   Breast cancer Mother    Cancer Brother 69   Heart attack Brother    Heart attack Brother    Birth defects Son    Colon cancer Neg Hx    Colon polyps Neg Hx    Esophageal cancer Neg Hx    Stomach  cancer Neg Hx    Rectal cancer Neg Hx     Review of Systems: Constitutional: Doesn't report fevers, chills or abnormal weight loss Eyes: Doesn't report blurriness of vision Ears, nose, mouth, throat, and face: Doesn't report sore throat Respiratory: Doesn't report cough, dyspnea or wheezes Cardiovascular: Doesn't report palpitation, chest discomfort  Gastrointestinal:  Doesn't report nausea, constipation, diarrhea GU: Doesn't report incontinence Skin: Doesn't report skin rashes Neurological: Per HPI Musculoskeletal: Doesn't report joint pain Behavioral/Psych: Doesn't report anxiety  Physical Exam: Vitals:   06/13/23 0948  BP: 122/67  Pulse: 61  Resp: 13  Temp: 97.9 F (36.6 C)  SpO2: 98%   KPS: 90. General: Alert, cooperative, pleasant, in no acute distress Head: Normal EENT: No conjunctival injection or scleral icterus.  Lungs: Resp effort normal Cardiac: Regular rate Abdomen: Non-distended abdomen Skin: No rashes cyanosis or petechiae. Extremities: No clubbing or edema  Neurologic Exam: Mental Status: Awake, alert, attentive to examiner. Oriented to self and environment. Language is fluent with intact comprehension.  Cranial Nerves: Visual acuity is grossly normal. Visual fields are full. Extra-ocular movements intact. No ptosis. Face is symmetric Motor: Tone and bulk are normal. Power is full in both arms and legs. Reflexes are symmetric, no pathologic reflexes present.  Sensory: Intact to light touch Gait: Normal.   Labs: I have reviewed the data as listed    Component Value Date/Time   NA 138 05/24/2023 0818   K 4.4 05/24/2023 0818   CL 103 05/24/2023 0818   CO2 25 05/24/2023 0818   GLUCOSE 303 (H) 05/24/2023 0818   BUN 16 05/24/2023 0818   CREATININE 1.16 05/24/2023 0818   CREATININE 1.31 (H) 02/25/2012 1438   CALCIUM 9.3 05/24/2023 0818   PROT 6.9 05/24/2023 0818   ALBUMIN 4.1 05/24/2023 0818   AST 12 (L) 05/24/2023 0818   ALT 18 05/24/2023 0818    ALKPHOS 55 05/24/2023 0818   BILITOT 0.6 05/24/2023 0818   GFRNONAA >60 05/24/2023 0818   GFRNONAA >60 02/25/2012 1438   GFRAA >60 08/14/2019 1438   GFRAA >60 02/25/2012 1438   Lab Results  Component Value Date   WBC 4.9 06/13/2023   NEUTROABS 3.1 06/13/2023   HGB 14.5 06/13/2023   HCT 45.1 06/13/2023   MCV 80.2 06/13/2023   PLT 253 06/13/2023  Assessment/Plan Glioblastoma, IDH-wildtype (HCC)  Randall Lewis is clinically stable today, now having completed 2 weeks of radiation and Temodar .   We ultimately recommended continuing with course of intensity modulated radiation therapy and concurrent daily Temozolomide .  Radiation will be administered Mon-Fri over 6 weeks, Temodar  will be dosed at 75mg /m2 to be given daily over 42 days.  We reviewed side effects of temodar , including fatigue, nausea/vomiting, constipation, and cytopenias.  Chemotherapy should be held for the following:  ANC less than 1,000  Platelets less than 100,000  LFT or creatinine greater than 2x ULN  If clinical concerns/contraindications develop  Every 2 weeks during radiation, labs will be checked accompanied by a clinical evaluation in the brain tumor clinic.  All questions were answered. The patient knows to call the clinic with any problems, questions or concerns. No barriers to learning were detected.  The total time spent in the encounter was 30 minutes and more than 50% was on counseling and review of test results   Arthea MARLA Manns, MD Medical Director of Neuro-Oncology [facility] at [facility] 06/13/23 10:00 AM

## 2023-06-14 ENCOUNTER — Ambulatory Visit
Admission: RE | Admit: 2023-06-14 | Discharge: 2023-06-14 | Disposition: A | Discharge status: Home / Self Care | Payer: Medicare PPO | Source: Ambulatory Visit | Attending: Radiation Oncology | Admitting: Radiation Oncology

## 2023-06-14 ENCOUNTER — Telehealth: Payer: Self-pay | Admitting: Internal Medicine

## 2023-06-14 ENCOUNTER — Other Ambulatory Visit: Payer: Self-pay

## 2023-06-14 DIAGNOSIS — Z803 Family history of malignant neoplasm of breast: Secondary | ICD-10-CM | POA: Diagnosis not present

## 2023-06-14 DIAGNOSIS — Z510 Encounter for antineoplastic radiation therapy: Secondary | ICD-10-CM | POA: Diagnosis not present

## 2023-06-14 DIAGNOSIS — C711 Malignant neoplasm of frontal lobe: Secondary | ICD-10-CM | POA: Diagnosis not present

## 2023-06-14 DIAGNOSIS — Z87891 Personal history of nicotine dependence: Secondary | ICD-10-CM | POA: Diagnosis not present

## 2023-06-14 DIAGNOSIS — C712 Malignant neoplasm of temporal lobe: Secondary | ICD-10-CM | POA: Diagnosis not present

## 2023-06-14 LAB — RAD ONC ARIA SESSION SUMMARY
Course Elapsed Days: 15
Plan Fractions Treated to Date: 11
Plan Prescribed Dose Per Fraction: 2 Gy
Plan Total Fractions Prescribed: 23
Plan Total Prescribed Dose: 46 Gy
Reference Point Dosage Given to Date: 22 Gy
Reference Point Session Dosage Given: 2 Gy
Session Number: 11

## 2023-06-14 NOTE — Telephone Encounter (Signed)
 Left patient a voicemail in regards to scheduled appointments times/dates left callback number for scheduling

## 2023-06-14 NOTE — Progress Notes (Signed)
 Specialty Pharmacy Ongoing Clinical Assessment Note  Randall Lewis is a 68 y.o. male who is being followed by the specialty pharmacy service for RxSp Oncology   Patient's specialty medication(s) reviewed today: Temozolomide  (TEMODAR )   Missed doses in the last 4 weeks: 8 (missed first 8 doses of 140mg , patient informed provider and was told to continue taking with 20mg  for the remainder of radiation)   Patient/Caregiver did not have any additional questions or concerns.   Therapeutic benefit summary: Unable to assess   Adverse events/side effects summary: No adverse events/side effects   Patient's therapy is appropriate to: Continue    Goals Addressed             This Visit's Progress    Maintain optimal adherence to therapy   On track    Patient is initiating therapy. Patient will maintain adherence         Follow up:  no further follow up needed, therapy to complete after 42 days in conjunction with radiation.   Naba Sneed M Micheal Murad Specialty Pharmacist

## 2023-06-14 NOTE — Progress Notes (Signed)
 Therapy will be complete after 42 days in conjunction with radiation. Disenrolling.

## 2023-06-15 ENCOUNTER — Other Ambulatory Visit: Payer: Self-pay

## 2023-06-15 ENCOUNTER — Ambulatory Visit
Admission: RE | Admit: 2023-06-15 | Discharge: 2023-06-15 | Disposition: A | Discharge status: Home / Self Care | Payer: Medicare PPO | Source: Ambulatory Visit | Attending: Radiation Oncology | Admitting: Radiation Oncology

## 2023-06-15 DIAGNOSIS — C712 Malignant neoplasm of temporal lobe: Secondary | ICD-10-CM | POA: Diagnosis not present

## 2023-06-15 DIAGNOSIS — Z803 Family history of malignant neoplasm of breast: Secondary | ICD-10-CM | POA: Diagnosis not present

## 2023-06-15 DIAGNOSIS — Z510 Encounter for antineoplastic radiation therapy: Secondary | ICD-10-CM | POA: Diagnosis not present

## 2023-06-15 DIAGNOSIS — C711 Malignant neoplasm of frontal lobe: Secondary | ICD-10-CM | POA: Diagnosis not present

## 2023-06-15 DIAGNOSIS — Z87891 Personal history of nicotine dependence: Secondary | ICD-10-CM | POA: Diagnosis not present

## 2023-06-15 LAB — RAD ONC ARIA SESSION SUMMARY
Course Elapsed Days: 16
Plan Fractions Treated to Date: 12
Plan Prescribed Dose Per Fraction: 2 Gy
Plan Total Fractions Prescribed: 23
Plan Total Prescribed Dose: 46 Gy
Reference Point Dosage Given to Date: 24 Gy
Reference Point Session Dosage Given: 2 Gy
Session Number: 12

## 2023-06-16 ENCOUNTER — Ambulatory Visit
Admission: RE | Admit: 2023-06-16 | Discharge: 2023-06-16 | Disposition: A | Discharge status: Home / Self Care | Payer: Medicare PPO | Source: Ambulatory Visit | Attending: Radiation Oncology | Admitting: Radiation Oncology

## 2023-06-16 ENCOUNTER — Other Ambulatory Visit: Payer: Self-pay

## 2023-06-16 DIAGNOSIS — C711 Malignant neoplasm of frontal lobe: Secondary | ICD-10-CM | POA: Diagnosis not present

## 2023-06-16 DIAGNOSIS — Z510 Encounter for antineoplastic radiation therapy: Secondary | ICD-10-CM | POA: Diagnosis not present

## 2023-06-16 DIAGNOSIS — Z87891 Personal history of nicotine dependence: Secondary | ICD-10-CM | POA: Diagnosis not present

## 2023-06-16 DIAGNOSIS — C712 Malignant neoplasm of temporal lobe: Secondary | ICD-10-CM | POA: Diagnosis not present

## 2023-06-16 DIAGNOSIS — Z803 Family history of malignant neoplasm of breast: Secondary | ICD-10-CM | POA: Diagnosis not present

## 2023-06-16 LAB — RAD ONC ARIA SESSION SUMMARY
Course Elapsed Days: 17
Plan Fractions Treated to Date: 13
Plan Prescribed Dose Per Fraction: 2 Gy
Plan Total Fractions Prescribed: 23
Plan Total Prescribed Dose: 46 Gy
Reference Point Dosage Given to Date: 26 Gy
Reference Point Session Dosage Given: 2 Gy
Session Number: 13

## 2023-06-17 ENCOUNTER — Ambulatory Visit
Admission: RE | Admit: 2023-06-17 | Discharge: 2023-06-17 | Disposition: A | Discharge status: Home / Self Care | Payer: Medicare PPO | Source: Ambulatory Visit | Attending: Radiation Oncology | Admitting: Radiation Oncology

## 2023-06-17 ENCOUNTER — Other Ambulatory Visit: Payer: Self-pay

## 2023-06-17 ENCOUNTER — Ambulatory Visit
Admission: RE | Admit: 2023-06-17 | Discharge: 2023-06-17 | Disposition: A | Discharge status: Home / Self Care | Payer: Medicare PPO | Source: Ambulatory Visit | Attending: Radiation Oncology

## 2023-06-17 DIAGNOSIS — Z803 Family history of malignant neoplasm of breast: Secondary | ICD-10-CM | POA: Diagnosis not present

## 2023-06-17 DIAGNOSIS — Z87891 Personal history of nicotine dependence: Secondary | ICD-10-CM | POA: Diagnosis not present

## 2023-06-17 DIAGNOSIS — Z510 Encounter for antineoplastic radiation therapy: Secondary | ICD-10-CM | POA: Diagnosis not present

## 2023-06-17 DIAGNOSIS — C711 Malignant neoplasm of frontal lobe: Secondary | ICD-10-CM | POA: Diagnosis not present

## 2023-06-17 DIAGNOSIS — C712 Malignant neoplasm of temporal lobe: Secondary | ICD-10-CM | POA: Diagnosis not present

## 2023-06-17 LAB — RAD ONC ARIA SESSION SUMMARY
Course Elapsed Days: 18
Plan Fractions Treated to Date: 14
Plan Prescribed Dose Per Fraction: 2 Gy
Plan Total Fractions Prescribed: 23
Plan Total Prescribed Dose: 46 Gy
Reference Point Dosage Given to Date: 28 Gy
Reference Point Session Dosage Given: 2 Gy
Session Number: 14

## 2023-06-20 ENCOUNTER — Other Ambulatory Visit: Payer: Self-pay

## 2023-06-20 ENCOUNTER — Ambulatory Visit
Admission: RE | Admit: 2023-06-20 | Discharge: 2023-06-20 | Disposition: A | Discharge status: Home / Self Care | Payer: Medicare PPO | Source: Ambulatory Visit | Attending: Radiation Oncology | Admitting: Radiation Oncology

## 2023-06-20 DIAGNOSIS — C711 Malignant neoplasm of frontal lobe: Secondary | ICD-10-CM | POA: Diagnosis not present

## 2023-06-20 DIAGNOSIS — C712 Malignant neoplasm of temporal lobe: Secondary | ICD-10-CM | POA: Diagnosis not present

## 2023-06-20 DIAGNOSIS — Z510 Encounter for antineoplastic radiation therapy: Secondary | ICD-10-CM | POA: Diagnosis not present

## 2023-06-20 DIAGNOSIS — Z803 Family history of malignant neoplasm of breast: Secondary | ICD-10-CM | POA: Diagnosis not present

## 2023-06-20 DIAGNOSIS — Z87891 Personal history of nicotine dependence: Secondary | ICD-10-CM | POA: Diagnosis not present

## 2023-06-20 LAB — RAD ONC ARIA SESSION SUMMARY
Course Elapsed Days: 21
Plan Fractions Treated to Date: 15
Plan Prescribed Dose Per Fraction: 2 Gy
Plan Total Fractions Prescribed: 23
Plan Total Prescribed Dose: 46 Gy
Reference Point Dosage Given to Date: 30 Gy
Reference Point Session Dosage Given: 2 Gy
Session Number: 15

## 2023-06-21 ENCOUNTER — Ambulatory Visit
Admission: RE | Admit: 2023-06-21 | Discharge: 2023-06-21 | Disposition: A | Discharge status: Home / Self Care | Payer: Medicare PPO | Source: Ambulatory Visit | Attending: Radiation Oncology

## 2023-06-21 ENCOUNTER — Other Ambulatory Visit: Payer: Self-pay

## 2023-06-21 DIAGNOSIS — C712 Malignant neoplasm of temporal lobe: Secondary | ICD-10-CM | POA: Diagnosis not present

## 2023-06-21 DIAGNOSIS — Z510 Encounter for antineoplastic radiation therapy: Secondary | ICD-10-CM | POA: Diagnosis not present

## 2023-06-21 DIAGNOSIS — C711 Malignant neoplasm of frontal lobe: Secondary | ICD-10-CM | POA: Diagnosis not present

## 2023-06-21 DIAGNOSIS — Z87891 Personal history of nicotine dependence: Secondary | ICD-10-CM | POA: Diagnosis not present

## 2023-06-21 DIAGNOSIS — Z803 Family history of malignant neoplasm of breast: Secondary | ICD-10-CM | POA: Diagnosis not present

## 2023-06-21 LAB — RAD ONC ARIA SESSION SUMMARY
Course Elapsed Days: 22
Plan Fractions Treated to Date: 16
Plan Prescribed Dose Per Fraction: 2 Gy
Plan Total Fractions Prescribed: 23
Plan Total Prescribed Dose: 46 Gy
Reference Point Dosage Given to Date: 32 Gy
Reference Point Session Dosage Given: 2 Gy
Session Number: 16

## 2023-06-22 ENCOUNTER — Ambulatory Visit
Admission: RE | Admit: 2023-06-22 | Discharge: 2023-06-22 | Disposition: A | Discharge status: Home / Self Care | Payer: Medicare PPO | Source: Ambulatory Visit | Attending: Radiation Oncology | Admitting: Radiation Oncology

## 2023-06-22 ENCOUNTER — Other Ambulatory Visit: Payer: Self-pay

## 2023-06-22 DIAGNOSIS — Z803 Family history of malignant neoplasm of breast: Secondary | ICD-10-CM | POA: Diagnosis not present

## 2023-06-22 DIAGNOSIS — C712 Malignant neoplasm of temporal lobe: Secondary | ICD-10-CM | POA: Diagnosis not present

## 2023-06-22 DIAGNOSIS — Z510 Encounter for antineoplastic radiation therapy: Secondary | ICD-10-CM | POA: Diagnosis not present

## 2023-06-22 DIAGNOSIS — C711 Malignant neoplasm of frontal lobe: Secondary | ICD-10-CM | POA: Diagnosis not present

## 2023-06-22 DIAGNOSIS — Z87891 Personal history of nicotine dependence: Secondary | ICD-10-CM | POA: Diagnosis not present

## 2023-06-22 LAB — RAD ONC ARIA SESSION SUMMARY
Course Elapsed Days: 23
Plan Fractions Treated to Date: 17
Plan Prescribed Dose Per Fraction: 2 Gy
Plan Total Fractions Prescribed: 23
Plan Total Prescribed Dose: 46 Gy
Reference Point Dosage Given to Date: 34 Gy
Reference Point Session Dosage Given: 2 Gy
Session Number: 17

## 2023-06-23 ENCOUNTER — Other Ambulatory Visit: Payer: Self-pay

## 2023-06-23 ENCOUNTER — Ambulatory Visit
Admission: RE | Admit: 2023-06-23 | Discharge: 2023-06-23 | Disposition: A | Discharge status: Home / Self Care | Payer: Medicare PPO | Source: Ambulatory Visit | Attending: Radiation Oncology | Admitting: Radiation Oncology

## 2023-06-23 DIAGNOSIS — Z510 Encounter for antineoplastic radiation therapy: Secondary | ICD-10-CM | POA: Diagnosis not present

## 2023-06-23 DIAGNOSIS — Z87891 Personal history of nicotine dependence: Secondary | ICD-10-CM | POA: Diagnosis not present

## 2023-06-23 DIAGNOSIS — C711 Malignant neoplasm of frontal lobe: Secondary | ICD-10-CM | POA: Diagnosis not present

## 2023-06-23 DIAGNOSIS — C712 Malignant neoplasm of temporal lobe: Secondary | ICD-10-CM | POA: Diagnosis not present

## 2023-06-23 DIAGNOSIS — Z803 Family history of malignant neoplasm of breast: Secondary | ICD-10-CM | POA: Diagnosis not present

## 2023-06-23 LAB — RAD ONC ARIA SESSION SUMMARY
Course Elapsed Days: 24
Plan Fractions Treated to Date: 18
Plan Prescribed Dose Per Fraction: 2 Gy
Plan Total Fractions Prescribed: 23
Plan Total Prescribed Dose: 46 Gy
Reference Point Dosage Given to Date: 36 Gy
Reference Point Session Dosage Given: 2 Gy
Session Number: 18

## 2023-06-24 ENCOUNTER — Ambulatory Visit
Admission: RE | Admit: 2023-06-24 | Discharge: 2023-06-24 | Disposition: A | Discharge status: Home / Self Care | Payer: Medicare PPO | Source: Ambulatory Visit | Attending: Radiation Oncology

## 2023-06-24 ENCOUNTER — Ambulatory Visit
Admission: RE | Admit: 2023-06-24 | Discharge: 2023-06-24 | Disposition: A | Discharge status: Home / Self Care | Payer: Medicare PPO | Source: Ambulatory Visit | Attending: Radiation Oncology | Admitting: Radiation Oncology

## 2023-06-24 ENCOUNTER — Other Ambulatory Visit: Payer: Self-pay

## 2023-06-24 DIAGNOSIS — Z510 Encounter for antineoplastic radiation therapy: Secondary | ICD-10-CM | POA: Diagnosis not present

## 2023-06-24 DIAGNOSIS — Z87891 Personal history of nicotine dependence: Secondary | ICD-10-CM | POA: Diagnosis not present

## 2023-06-24 DIAGNOSIS — Z803 Family history of malignant neoplasm of breast: Secondary | ICD-10-CM | POA: Diagnosis not present

## 2023-06-24 DIAGNOSIS — C711 Malignant neoplasm of frontal lobe: Secondary | ICD-10-CM | POA: Diagnosis not present

## 2023-06-24 DIAGNOSIS — C712 Malignant neoplasm of temporal lobe: Secondary | ICD-10-CM | POA: Diagnosis not present

## 2023-06-24 LAB — RAD ONC ARIA SESSION SUMMARY
Course Elapsed Days: 25
Plan Fractions Treated to Date: 19
Plan Prescribed Dose Per Fraction: 2 Gy
Plan Total Fractions Prescribed: 23
Plan Total Prescribed Dose: 46 Gy
Reference Point Dosage Given to Date: 38 Gy
Reference Point Session Dosage Given: 2 Gy
Session Number: 19

## 2023-06-27 ENCOUNTER — Inpatient Hospital Stay: Payer: Medicare PPO

## 2023-06-27 ENCOUNTER — Ambulatory Visit
Admission: RE | Admit: 2023-06-27 | Discharge: 2023-06-27 | Disposition: A | Discharge status: Home / Self Care | Payer: Medicare PPO | Source: Ambulatory Visit | Attending: Radiation Oncology

## 2023-06-27 ENCOUNTER — Other Ambulatory Visit: Payer: Self-pay

## 2023-06-27 ENCOUNTER — Ambulatory Visit: Payer: Medicare PPO | Admitting: Internal Medicine

## 2023-06-27 ENCOUNTER — Inpatient Hospital Stay (HOSPITAL_BASED_OUTPATIENT_CLINIC_OR_DEPARTMENT_OTHER): Payer: Medicare PPO | Admitting: Internal Medicine

## 2023-06-27 ENCOUNTER — Inpatient Hospital Stay: Payer: Medicare PPO | Admitting: Internal Medicine

## 2023-06-27 VITALS — BP 109/73 | HR 57 | Temp 97.7°F | Resp 17 | Ht 71.0 in | Wt 234.7 lb

## 2023-06-27 DIAGNOSIS — C719 Malignant neoplasm of brain, unspecified: Secondary | ICD-10-CM

## 2023-06-27 DIAGNOSIS — Z87891 Personal history of nicotine dependence: Secondary | ICD-10-CM | POA: Diagnosis not present

## 2023-06-27 DIAGNOSIS — C712 Malignant neoplasm of temporal lobe: Secondary | ICD-10-CM | POA: Diagnosis not present

## 2023-06-27 DIAGNOSIS — Z510 Encounter for antineoplastic radiation therapy: Secondary | ICD-10-CM | POA: Diagnosis not present

## 2023-06-27 DIAGNOSIS — Z803 Family history of malignant neoplasm of breast: Secondary | ICD-10-CM | POA: Diagnosis not present

## 2023-06-27 DIAGNOSIS — C711 Malignant neoplasm of frontal lobe: Secondary | ICD-10-CM | POA: Diagnosis not present

## 2023-06-27 LAB — CMP (CANCER CENTER ONLY)
ALT: 19 U/L (ref 0–44)
AST: 14 U/L — ABNORMAL LOW (ref 15–41)
Albumin: 4.2 g/dL (ref 3.5–5.0)
Alkaline Phosphatase: 52 U/L (ref 38–126)
Anion gap: 6 (ref 5–15)
BUN: 17 mg/dL (ref 8–23)
CO2: 29 mmol/L (ref 22–32)
Calcium: 9.5 mg/dL (ref 8.9–10.3)
Chloride: 103 mmol/L (ref 98–111)
Creatinine: 1.3 mg/dL — ABNORMAL HIGH (ref 0.61–1.24)
GFR, Estimated: >60 mL/min (ref >60)
Glucose, Bld: 184 mg/dL — ABNORMAL HIGH (ref 70–99)
Potassium: 4.5 mmol/L (ref 3.5–5.1)
Sodium: 138 mmol/L (ref 135–145)
Total Bilirubin: 0.7 mg/dL (ref 0.0–1.2)
Total Protein: 7 g/dL (ref 6.5–8.1)

## 2023-06-27 LAB — CBC WITH DIFFERENTIAL (CANCER CENTER ONLY)
Abs Immature Granulocytes: 0.01 10*3/uL (ref 0.00–0.07)
Basophils Absolute: 0 10*3/uL (ref 0.0–0.1)
Basophils Relative: 0 %
Eosinophils Absolute: 0 10*3/uL (ref 0.0–0.5)
Eosinophils Relative: 0 %
HCT: 46.6 % (ref 39.0–52.0)
Hemoglobin: 15 g/dL (ref 13.0–17.0)
Immature Granulocytes: 0 %
Lymphocytes Relative: 30 %
Lymphs Abs: 1.6 10*3/uL (ref 0.7–4.0)
MCH: 26.1 pg (ref 26.0–34.0)
MCHC: 32.2 g/dL (ref 30.0–36.0)
MCV: 81 fL (ref 80.0–100.0)
Monocytes Absolute: 0.4 10*3/uL (ref 0.1–1.0)
Monocytes Relative: 7 %
Neutro Abs: 3.3 10*3/uL (ref 1.7–7.7)
Neutrophils Relative %: 63 %
Platelet Count: 260 10*3/uL (ref 150–400)
RBC: 5.75 MIL/uL (ref 4.22–5.81)
RDW: 15.9 % — ABNORMAL HIGH (ref 11.5–15.5)
WBC Count: 5.3 10*3/uL (ref 4.0–10.5)
nRBC: 0 % (ref 0.0–0.2)

## 2023-06-27 LAB — RAD ONC ARIA SESSION SUMMARY
Course Elapsed Days: 28
Plan Fractions Treated to Date: 20
Plan Prescribed Dose Per Fraction: 2 Gy
Plan Total Fractions Prescribed: 23
Plan Total Prescribed Dose: 46 Gy
Reference Point Dosage Given to Date: 40 Gy
Reference Point Session Dosage Given: 2 Gy
Session Number: 20

## 2023-06-27 NOTE — Progress Notes (Signed)
[facility] [address]  [place], [place] [address] [phone]   Interval Evaluation  Date of Service: 06/27/23 Patient Name: Randall Lewis Patient MRN: [id number] Patient DOB: [birth year] Provider: Henreitta Leber, MD  Identifying Statement:  Randall Lewis is a 68 y.o. male with right frontal glioblastoma    Oncologic History: Anaplastic oligodendroglioma of frontal lobe (CMS/HHS-HCC)  04/16/2005 Surgery  Craniotomy for resection of left frontal lesion. 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 Lost to follow up after 03/18/2010  05/02/2023 Presentation  Presentation to the [facility] at [facility] Recommendations pending final pathology. Likely XRT/Temozolomide as this is a high grade glioma. 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 Pathology reveals Infiltrating High Grade Glioma pending final molecular studies for integrated diagnosis.  Molecular data (see molecular reports for more details): IDH mutation is NOT detected (PGDx elio Solid Tumor NGS Panel) H3 mutation is NOT detected (PGDx elio Solid Tumor NGS Panel) 1p/19q codeletion is NOT detected (chromosomal microarray) Chromothripsis of chromosomes 7 and 13 is detected (chromosomal microarray) Chromosome 10 loss with PTEN loss is detected (chromosomal microarray) Chromosome 9  segmental loss with homozygous loss of CDKN2A/B is detected (chromosomal microarray) TERT promoter mutation is detected (PGDx elio Solid Tumor NGS Panel) EGFRvIII is detected (PGDx elio Solid Tumor NGS Panel) EGFR amplification is detected (chromosomal microarray and PGDx elio Solid Tumor NGS Panel) MDM2 amplification is detected (chromosomal microarray and PGDx elio Solid Tumor NGS Panel)    Interval History: Randall Lewis presents today for follow up, now having completed 4 weeks of IMRT and Temodar.  No issues tolerating treatment thus far, though he does complain of impaired taste.  No headaches or seizures.    H+P (05/19/23) Patient presents for follow up after craniotomy, resection of recurrent primary brain tumor at [facility] on 04/18/23.  He tolerated surgery well, no ill effects.  He is functionally intact, no further headaches, no seizures.  Recently increased blood pressure medication with his PCP.  Has visit with radiation oncology upcoming as well.  Medications: Current Outpatient Medications on File Prior to Visit  Medication Sig Dispense Refill   acetaminophen (TYLENOL) 325 MG tablet Take 975 mg by mouth every 6 (six) hours as needed.     amLODipine (NORVASC) 5 MG tablet Take 5 mg by mouth daily.     buPROPion (WELLBUTRIN SR) 100 MG 12 hr tablet Take 100 mg by mouth every morning.     JARDIANCE 25 MG TABS tablet Take 25 mg by mouth daily.     losartan-hydrochlorothiazide (HYZAAR) 100-12.5 MG tablet Take 1 tablet by mouth daily.     metFORMIN (GLUCOPHAGE-XR) 500 MG 24 hr tablet Take 1,000 mg by mouth daily.     metoprolol tartrate (LOPRESSOR) 12.5 mg TABS tablet Take 12.5 mg by mouth daily.     ondansetron (ZOFRAN) 8 MG tablet Take 1 tablet (8 mg total) by mouth every 8 (eight) hours as needed for nausea or vomiting. May take 30-60  minutes prior to Temodar administration if nausea/vomiting occurs as needed. 30 tablet 1   oxyCODONE (OXY IR/ROXICODONE) 5 MG immediate release tablet  Take 5 mg by mouth every 4 (four) hours as needed.     pioglitazone (ACTOS) 15 MG tablet Take 15 mg by mouth daily.     simvastatin (ZOCOR) 20 MG tablet Take 20 mg by mouth daily.     tamsulosin (FLOMAX) 0.4 MG CAPS capsule Take 0.4 mg by mouth daily.     temozolomide (TEMODAR) 140 MG capsule Take 1 capsule (140 mg total) by mouth daily. May take on an empty stomach to decrease nausea & vomiting. 42 capsule 0   temozolomide (TEMODAR) 20 MG capsule Take 1 capsule (20 mg total) by mouth daily. May take on an empty stomach to decrease nausea & vomiting. 42 capsule 0   No current facility-administered medications on file prior to visit.    Allergies:  Allergies  Allergen Reactions   Sildenafil Other (See Comments) and Nausea Only   Gadolinium Derivatives Nausea And Vomiting    Per pt , always get sick with gado and never offered anti nausea meds.   Past Medical History:  Past Medical History:  Diagnosis Date   BPH (benign prostatic hyperplasia)    Cancer (HCC) 2006   Brain tumor, in remission after chemo radiation and surgery   Diabetes mellitus without complication (HCC)    ED (erectile dysfunction)    Heart murmur    History of heart murmur   Hyperlipidemia    Hypertension    Sleep apnea    USES C-PAP   Past Surgical History:  Past Surgical History:  Procedure Laterality Date   BRAIN SURGERY  2006   BRAIN TUMOR  - CANCER   COLONOSCOPY     CYSTOSCOPY N/A 07/29/2015   Procedure: FLEXIBLE CYSTOSCOPY;  Surgeon: Bjorn Pippin, MD;  Location: [facility];  Service: Urology;  Laterality: N/A;   FOOT BONE EXCISION  1989   PENILE PROSTHESIS IMPLANT N/A 07/29/2015   Procedure: PENILE PROSTHESIS;  Surgeon: Bjorn Pippin, MD;  Location: [facility];  Service: Urology;  Laterality: N/A;   right knee meniscus repair  2010   THYROID LOBECTOMY  05/12/2012   Procedure: THYROID LOBECTOMY;  Surgeon: Velora Heckler, MD;  Location: [facility];  Service: General;  Laterality: Left;  left thyroid lobectomy   Social  History:  Social History   Socioeconomic History   Marital status: Married    Spouse name: [name]   Number of children: 2   Years of education: Not on file   Highest education level: Master's degree (e.g., MA, MS, MEng, MEd, MSW, MBA)  Occupational History   Not on file  Tobacco Use   Smoking status: Former    Current packs/day: 0.00    Average packs/day: 1 pack/day for 23.0 years (23.0 ttl pk-yrs)    Types: Cigarettes    Start date: 04/26/1973    Quit date: 04/26/1996    Years since quitting: 27.1   Smokeless tobacco: Never  Substance and Sexual Activity   Alcohol use: No   Drug use: No   Sexual activity: Not on file  Other Topics Concern   Not on file  Social History Narrative   Lives with wife   Right handed   Caffeine: 2 cups of coffee and 1 glass of tea/soda a day   Social Drivers of Health   Financial Resource Strain: Low Risk  (05/01/2023)   Received from [facility]  Overall Financial Resource Strain (CARDIA)    Difficulty of Paying Living Expenses: Not hard at all  Food Insecurity: No Food Insecurity (05/20/2023)   Hunger Vital Sign    Worried About Running Out of Food in the Last Year: Never true    Ran Out of Food in the Last Year: Never true  Transportation Needs: No Transportation Needs (05/20/2023)   PRAPARE - Administrator, Civil Service (Medical): No    Lack of Transportation (Non-Medical): No  Physical Activity: Not on file  Stress: Not on file  Social Connections: Not on file  Intimate Partner Violence: Not At Risk (05/20/2023)   Humiliation, Afraid, Rape, and Kick questionnaire    Fear of Current or Ex-Partner: No    Emotionally Abused: No    Physically Abused: No    Sexually Abused: No   Family History:  Family History  Problem Relation Age of Onset   Breast cancer Mother    Cancer Brother 72   Heart attack Brother    Heart attack Brother    Birth defects Son    Colon cancer Neg Hx    Colon polyps Neg Hx     Esophageal cancer Neg Hx    Stomach cancer Neg Hx    Rectal cancer Neg Hx     Review of Systems: Constitutional: Doesn't report fevers, chills or abnormal weight loss Eyes: Doesn't report blurriness of vision Ears, nose, mouth, throat, and face: Doesn't report sore throat Respiratory: Doesn't report cough, dyspnea or wheezes Cardiovascular: Doesn't report palpitation, chest discomfort  Gastrointestinal:  Doesn't report nausea, constipation, diarrhea GU: Doesn't report incontinence Skin: Doesn't report skin rashes Neurological: Per HPI Musculoskeletal: Doesn't report joint pain Behavioral/Psych: Doesn't report anxiety  Physical Exam: Vitals:   06/27/23 1002  BP: 109/73  Pulse: (!) 57  Resp: 17  Temp: 97.7 F (36.5 C)  SpO2: 98%   KPS: 90. General: Alert, cooperative, pleasant, in no acute distress Head: Normal EENT: No conjunctival injection or scleral icterus.  Lungs: Resp effort normal Cardiac: Regular rate Abdomen: Non-distended abdomen Skin: No rashes cyanosis or petechiae. Extremities: No clubbing or edema  Neurologic Exam: Mental Status: Awake, alert, attentive to examiner. Oriented to self and environment. Language is fluent with intact comprehension.  Cranial Nerves: Visual acuity is grossly normal. Visual fields are full. Extra-ocular movements intact. No ptosis. Face is symmetric Motor: Tone and bulk are normal. Power is full in both arms and legs. Reflexes are symmetric, no pathologic reflexes present.  Sensory: Intact to light touch Gait: Normal.   Labs: I have reviewed the data as listed    Component Value Date/Time   NA 138 06/27/2023 0842   K 4.5 06/27/2023 0842   CL 103 06/27/2023 0842   CO2 29 06/27/2023 0842   GLUCOSE 184 (H) 06/27/2023 0842   BUN 17 06/27/2023 0842   CREATININE 1.30 (H) 06/27/2023 0842   CREATININE 1.31 (H) 02/25/2012 1438   CALCIUM 9.5 06/27/2023 0842   PROT 7.0 06/27/2023 0842   ALBUMIN 4.2 06/27/2023 0842   AST 14  (L) 06/27/2023 0842   ALT 19 06/27/2023 0842   ALKPHOS 52 06/27/2023 0842   BILITOT 0.7 06/27/2023 0842   GFRNONAA >60 06/27/2023 0842   GFRNONAA >60 02/25/2012 1438   GFRAA >60 08/14/2019 1438   GFRAA >60 02/25/2012 1438   Lab Results  Component Value Date   WBC 5.3 06/27/2023   NEUTROABS 3.3 06/27/2023   HGB 15.0 06/27/2023   HCT 46.6 06/27/2023  MCV 81.0 06/27/2023   PLT 260 06/27/2023    Assessment/Plan Glioblastoma, IDH-wildtype (HCC)  Maye Hides is clinically stable today, now having completed 4 weeks of radiation and Temodar.  No new or progressive changes today.  We ultimately recommended continuing with course of intensity modulated radiation therapy and concurrent daily Temozolomide.  Radiation will be administered Mon-Fri over 6 weeks, Temodar will be dosed at 75mg /m2 to be given daily over 42 days.  We reviewed side effects of temodar, including fatigue, nausea/vomiting, constipation, and cytopenias.  Chemotherapy should be held for the following:  ANC less than 1,000  Platelets less than 100,000  LFT or creatinine greater than 2x ULN  If clinical concerns/contraindications develop  Every 2 weeks during radiation, labs will be checked accompanied by a clinical evaluation in the brain tumor clinic.  All questions were answered. The patient knows to call the clinic with any problems, questions or concerns. No barriers to learning were detected.  The total time spent in the encounter was 30 minutes and more than 50% was on counseling and review of test results   Henreitta Leber, MD Medical Director of Neuro-Oncology [facility] at [facility] 06/27/23 10:07 AM

## 2023-06-28 ENCOUNTER — Other Ambulatory Visit: Payer: Self-pay

## 2023-06-28 ENCOUNTER — Ambulatory Visit
Admission: RE | Admit: 2023-06-28 | Discharge: 2023-06-28 | Disposition: A | Discharge status: Home / Self Care | Payer: Medicare PPO | Source: Ambulatory Visit | Attending: Radiation Oncology | Admitting: Radiation Oncology

## 2023-06-28 DIAGNOSIS — C712 Malignant neoplasm of temporal lobe: Secondary | ICD-10-CM | POA: Diagnosis not present

## 2023-06-28 DIAGNOSIS — Z803 Family history of malignant neoplasm of breast: Secondary | ICD-10-CM | POA: Diagnosis not present

## 2023-06-28 DIAGNOSIS — C711 Malignant neoplasm of frontal lobe: Secondary | ICD-10-CM | POA: Diagnosis not present

## 2023-06-28 DIAGNOSIS — Z510 Encounter for antineoplastic radiation therapy: Secondary | ICD-10-CM | POA: Diagnosis not present

## 2023-06-28 DIAGNOSIS — Z87891 Personal history of nicotine dependence: Secondary | ICD-10-CM | POA: Diagnosis not present

## 2023-06-28 LAB — RAD ONC ARIA SESSION SUMMARY
Course Elapsed Days: 29
Plan Fractions Treated to Date: 21
Plan Prescribed Dose Per Fraction: 2 Gy
Plan Total Fractions Prescribed: 23
Plan Total Prescribed Dose: 46 Gy
Reference Point Dosage Given to Date: 42 Gy
Reference Point Session Dosage Given: 2 Gy
Session Number: 21

## 2023-06-29 ENCOUNTER — Other Ambulatory Visit: Payer: Self-pay

## 2023-06-29 ENCOUNTER — Ambulatory Visit
Admission: RE | Admit: 2023-06-29 | Discharge: 2023-06-29 | Disposition: A | Discharge status: Home / Self Care | Payer: Medicare PPO | Source: Ambulatory Visit | Attending: Radiation Oncology

## 2023-06-29 DIAGNOSIS — Z87891 Personal history of nicotine dependence: Secondary | ICD-10-CM | POA: Diagnosis not present

## 2023-06-29 DIAGNOSIS — Z803 Family history of malignant neoplasm of breast: Secondary | ICD-10-CM | POA: Diagnosis not present

## 2023-06-29 DIAGNOSIS — C711 Malignant neoplasm of frontal lobe: Secondary | ICD-10-CM | POA: Diagnosis not present

## 2023-06-29 DIAGNOSIS — Z510 Encounter for antineoplastic radiation therapy: Secondary | ICD-10-CM | POA: Diagnosis not present

## 2023-06-29 DIAGNOSIS — C712 Malignant neoplasm of temporal lobe: Secondary | ICD-10-CM | POA: Diagnosis not present

## 2023-06-29 LAB — RAD ONC ARIA SESSION SUMMARY
Course Elapsed Days: 30
Plan Fractions Treated to Date: 22
Plan Prescribed Dose Per Fraction: 2 Gy
Plan Total Fractions Prescribed: 23
Plan Total Prescribed Dose: 46 Gy
Reference Point Dosage Given to Date: 44 Gy
Reference Point Session Dosage Given: 2 Gy
Session Number: 22

## 2023-06-30 ENCOUNTER — Ambulatory Visit
Admission: RE | Admit: 2023-06-30 | Discharge: 2023-06-30 | Disposition: A | Discharge status: Home / Self Care | Payer: Medicare PPO | Source: Ambulatory Visit | Attending: Radiation Oncology | Admitting: Radiation Oncology

## 2023-06-30 ENCOUNTER — Other Ambulatory Visit: Payer: Self-pay

## 2023-06-30 DIAGNOSIS — Z87891 Personal history of nicotine dependence: Secondary | ICD-10-CM | POA: Diagnosis not present

## 2023-06-30 DIAGNOSIS — Z803 Family history of malignant neoplasm of breast: Secondary | ICD-10-CM | POA: Diagnosis not present

## 2023-06-30 DIAGNOSIS — C712 Malignant neoplasm of temporal lobe: Secondary | ICD-10-CM | POA: Diagnosis not present

## 2023-06-30 DIAGNOSIS — C711 Malignant neoplasm of frontal lobe: Secondary | ICD-10-CM | POA: Diagnosis not present

## 2023-06-30 DIAGNOSIS — Z510 Encounter for antineoplastic radiation therapy: Secondary | ICD-10-CM | POA: Diagnosis not present

## 2023-06-30 LAB — RAD ONC ARIA SESSION SUMMARY
Course Elapsed Days: 31
Plan Fractions Treated to Date: 23
Plan Prescribed Dose Per Fraction: 2 Gy
Plan Total Fractions Prescribed: 23
Plan Total Prescribed Dose: 46 Gy
Reference Point Dosage Given to Date: 46 Gy
Reference Point Session Dosage Given: 2 Gy
Session Number: 23

## 2023-07-01 ENCOUNTER — Other Ambulatory Visit: Payer: Self-pay

## 2023-07-01 ENCOUNTER — Ambulatory Visit
Admission: RE | Admit: 2023-07-01 | Discharge: 2023-07-01 | Disposition: A | Discharge status: Home / Self Care | Payer: Medicare PPO | Source: Ambulatory Visit | Attending: Radiation Oncology | Admitting: Radiation Oncology

## 2023-07-01 ENCOUNTER — Ambulatory Visit
Admission: RE | Admit: 2023-07-01 | Discharge: 2023-07-01 | Disposition: A | Discharge status: Home / Self Care | Payer: Medicare PPO | Source: Ambulatory Visit | Attending: Radiation Oncology

## 2023-07-01 DIAGNOSIS — C712 Malignant neoplasm of temporal lobe: Secondary | ICD-10-CM | POA: Diagnosis not present

## 2023-07-01 DIAGNOSIS — Z803 Family history of malignant neoplasm of breast: Secondary | ICD-10-CM | POA: Diagnosis not present

## 2023-07-01 DIAGNOSIS — Z510 Encounter for antineoplastic radiation therapy: Secondary | ICD-10-CM | POA: Diagnosis not present

## 2023-07-01 DIAGNOSIS — C711 Malignant neoplasm of frontal lobe: Secondary | ICD-10-CM | POA: Diagnosis not present

## 2023-07-01 DIAGNOSIS — Z87891 Personal history of nicotine dependence: Secondary | ICD-10-CM | POA: Diagnosis not present

## 2023-07-01 LAB — RAD ONC ARIA SESSION SUMMARY
Course Elapsed Days: 32
Plan Fractions Treated to Date: 1
Plan Prescribed Dose Per Fraction: 2 Gy
Plan Total Fractions Prescribed: 7
Plan Total Prescribed Dose: 14 Gy
Reference Point Dosage Given to Date: 2 Gy
Reference Point Session Dosage Given: 2 Gy
Session Number: 24

## 2023-07-04 ENCOUNTER — Ambulatory Visit
Admission: RE | Admit: 2023-07-04 | Discharge: 2023-07-04 | Disposition: A | Discharge status: Home / Self Care | Payer: Medicare PPO | Source: Ambulatory Visit | Attending: Radiation Oncology | Admitting: Radiation Oncology

## 2023-07-04 ENCOUNTER — Other Ambulatory Visit: Payer: Self-pay

## 2023-07-04 DIAGNOSIS — Z803 Family history of malignant neoplasm of breast: Secondary | ICD-10-CM | POA: Diagnosis not present

## 2023-07-04 DIAGNOSIS — C712 Malignant neoplasm of temporal lobe: Secondary | ICD-10-CM | POA: Diagnosis not present

## 2023-07-04 DIAGNOSIS — Z510 Encounter for antineoplastic radiation therapy: Secondary | ICD-10-CM | POA: Diagnosis not present

## 2023-07-04 DIAGNOSIS — Z87891 Personal history of nicotine dependence: Secondary | ICD-10-CM | POA: Insufficient documentation

## 2023-07-04 LAB — RAD ONC ARIA SESSION SUMMARY
Course Elapsed Days: 35
Plan Fractions Treated to Date: 2
Plan Prescribed Dose Per Fraction: 2 Gy
Plan Total Fractions Prescribed: 7
Plan Total Prescribed Dose: 14 Gy
Reference Point Dosage Given to Date: 4 Gy
Reference Point Session Dosage Given: 2 Gy
Session Number: 25

## 2023-07-05 ENCOUNTER — Ambulatory Visit
Admission: RE | Admit: 2023-07-05 | Discharge: 2023-07-05 | Disposition: A | Discharge status: Home / Self Care | Payer: Medicare PPO | Source: Ambulatory Visit | Attending: Radiation Oncology | Admitting: Radiation Oncology

## 2023-07-05 ENCOUNTER — Other Ambulatory Visit: Payer: Self-pay

## 2023-07-05 DIAGNOSIS — E291 Testicular hypofunction: Secondary | ICD-10-CM | POA: Diagnosis not present

## 2023-07-05 DIAGNOSIS — C712 Malignant neoplasm of temporal lobe: Secondary | ICD-10-CM | POA: Diagnosis not present

## 2023-07-05 DIAGNOSIS — Z1212 Encounter for screening for malignant neoplasm of rectum: Secondary | ICD-10-CM | POA: Diagnosis not present

## 2023-07-05 DIAGNOSIS — Z87891 Personal history of nicotine dependence: Secondary | ICD-10-CM | POA: Diagnosis not present

## 2023-07-05 DIAGNOSIS — Z803 Family history of malignant neoplasm of breast: Secondary | ICD-10-CM | POA: Diagnosis not present

## 2023-07-05 DIAGNOSIS — Z510 Encounter for antineoplastic radiation therapy: Secondary | ICD-10-CM | POA: Diagnosis not present

## 2023-07-05 DIAGNOSIS — E785 Hyperlipidemia, unspecified: Secondary | ICD-10-CM | POA: Diagnosis not present

## 2023-07-05 LAB — RAD ONC ARIA SESSION SUMMARY
Course Elapsed Days: 36
Plan Fractions Treated to Date: 3
Plan Prescribed Dose Per Fraction: 2 Gy
Plan Total Fractions Prescribed: 7
Plan Total Prescribed Dose: 14 Gy
Reference Point Dosage Given to Date: 6 Gy
Reference Point Session Dosage Given: 2 Gy
Session Number: 26

## 2023-07-06 ENCOUNTER — Ambulatory Visit
Admission: RE | Admit: 2023-07-06 | Discharge: 2023-07-06 | Disposition: A | Discharge status: Home / Self Care | Payer: Medicare PPO | Source: Ambulatory Visit | Attending: Radiation Oncology | Admitting: Radiation Oncology

## 2023-07-06 ENCOUNTER — Other Ambulatory Visit: Payer: Self-pay

## 2023-07-06 DIAGNOSIS — C712 Malignant neoplasm of temporal lobe: Secondary | ICD-10-CM | POA: Diagnosis not present

## 2023-07-06 DIAGNOSIS — Z510 Encounter for antineoplastic radiation therapy: Secondary | ICD-10-CM | POA: Diagnosis not present

## 2023-07-06 DIAGNOSIS — Z803 Family history of malignant neoplasm of breast: Secondary | ICD-10-CM | POA: Diagnosis not present

## 2023-07-06 DIAGNOSIS — Z87891 Personal history of nicotine dependence: Secondary | ICD-10-CM | POA: Diagnosis not present

## 2023-07-06 LAB — RAD ONC ARIA SESSION SUMMARY
Course Elapsed Days: 37
Plan Fractions Treated to Date: 4
Plan Prescribed Dose Per Fraction: 2 Gy
Plan Total Fractions Prescribed: 7
Plan Total Prescribed Dose: 14 Gy
Reference Point Dosage Given to Date: 8 Gy
Reference Point Session Dosage Given: 2 Gy
Session Number: 27

## 2023-07-07 ENCOUNTER — Ambulatory Visit: Payer: Medicare PPO

## 2023-07-07 ENCOUNTER — Ambulatory Visit
Admission: RE | Admit: 2023-07-07 | Discharge: 2023-07-07 | Disposition: A | Discharge status: Home / Self Care | Payer: Medicare PPO | Source: Ambulatory Visit | Attending: Radiation Oncology | Admitting: Radiation Oncology

## 2023-07-07 ENCOUNTER — Other Ambulatory Visit: Payer: Self-pay

## 2023-07-07 DIAGNOSIS — C712 Malignant neoplasm of temporal lobe: Secondary | ICD-10-CM | POA: Diagnosis not present

## 2023-07-07 DIAGNOSIS — Z510 Encounter for antineoplastic radiation therapy: Secondary | ICD-10-CM | POA: Diagnosis not present

## 2023-07-07 DIAGNOSIS — Z803 Family history of malignant neoplasm of breast: Secondary | ICD-10-CM | POA: Diagnosis not present

## 2023-07-07 DIAGNOSIS — Z87891 Personal history of nicotine dependence: Secondary | ICD-10-CM | POA: Diagnosis not present

## 2023-07-07 LAB — RAD ONC ARIA SESSION SUMMARY
Course Elapsed Days: 38
Plan Fractions Treated to Date: 5
Plan Prescribed Dose Per Fraction: 2 Gy
Plan Total Fractions Prescribed: 7
Plan Total Prescribed Dose: 14 Gy
Reference Point Dosage Given to Date: 10 Gy
Reference Point Session Dosage Given: 2 Gy
Session Number: 28

## 2023-07-08 ENCOUNTER — Other Ambulatory Visit: Payer: Self-pay

## 2023-07-08 ENCOUNTER — Ambulatory Visit
Admission: RE | Admit: 2023-07-08 | Discharge: 2023-07-08 | Disposition: A | Discharge status: Home / Self Care | Payer: Medicare PPO | Source: Ambulatory Visit | Attending: Radiation Oncology

## 2023-07-08 DIAGNOSIS — Z510 Encounter for antineoplastic radiation therapy: Secondary | ICD-10-CM | POA: Diagnosis not present

## 2023-07-08 DIAGNOSIS — C712 Malignant neoplasm of temporal lobe: Secondary | ICD-10-CM | POA: Diagnosis not present

## 2023-07-08 DIAGNOSIS — Z803 Family history of malignant neoplasm of breast: Secondary | ICD-10-CM | POA: Diagnosis not present

## 2023-07-08 DIAGNOSIS — Z87891 Personal history of nicotine dependence: Secondary | ICD-10-CM | POA: Diagnosis not present

## 2023-07-08 LAB — RAD ONC ARIA SESSION SUMMARY
Course Elapsed Days: 39
Plan Fractions Treated to Date: 6
Plan Prescribed Dose Per Fraction: 2 Gy
Plan Total Fractions Prescribed: 7
Plan Total Prescribed Dose: 14 Gy
Reference Point Dosage Given to Date: 12 Gy
Reference Point Session Dosage Given: 2 Gy
Session Number: 29

## 2023-07-11 ENCOUNTER — Other Ambulatory Visit: Payer: Self-pay

## 2023-07-11 ENCOUNTER — Ambulatory Visit
Admission: RE | Admit: 2023-07-11 | Discharge: 2023-07-11 | Disposition: A | Discharge status: Home / Self Care | Payer: Medicare PPO | Source: Ambulatory Visit | Attending: Radiation Oncology

## 2023-07-11 ENCOUNTER — Inpatient Hospital Stay: Payer: Medicare PPO

## 2023-07-11 ENCOUNTER — Inpatient Hospital Stay: Payer: Medicare PPO | Attending: Internal Medicine

## 2023-07-11 ENCOUNTER — Inpatient Hospital Stay (HOSPITAL_BASED_OUTPATIENT_CLINIC_OR_DEPARTMENT_OTHER): Payer: Medicare PPO | Admitting: Internal Medicine

## 2023-07-11 ENCOUNTER — Inpatient Hospital Stay: Payer: Medicare PPO | Admitting: Internal Medicine

## 2023-07-11 ENCOUNTER — Telehealth: Payer: Self-pay | Admitting: Internal Medicine

## 2023-07-11 VITALS — BP 127/75 | HR 55 | Temp 97.4°F | Resp 16 | Wt 234.5 lb

## 2023-07-11 DIAGNOSIS — C719 Malignant neoplasm of brain, unspecified: Secondary | ICD-10-CM

## 2023-07-11 DIAGNOSIS — Z7984 Long term (current) use of oral hypoglycemic drugs: Secondary | ICD-10-CM | POA: Insufficient documentation

## 2023-07-11 DIAGNOSIS — Z803 Family history of malignant neoplasm of breast: Secondary | ICD-10-CM | POA: Diagnosis not present

## 2023-07-11 DIAGNOSIS — C712 Malignant neoplasm of temporal lobe: Secondary | ICD-10-CM | POA: Diagnosis not present

## 2023-07-11 DIAGNOSIS — I10 Essential (primary) hypertension: Secondary | ICD-10-CM | POA: Insufficient documentation

## 2023-07-11 DIAGNOSIS — N400 Enlarged prostate without lower urinary tract symptoms: Secondary | ICD-10-CM | POA: Insufficient documentation

## 2023-07-11 DIAGNOSIS — C711 Malignant neoplasm of frontal lobe: Secondary | ICD-10-CM | POA: Insufficient documentation

## 2023-07-11 DIAGNOSIS — Z79630 Long term (current) use of alkylating agent: Secondary | ICD-10-CM | POA: Insufficient documentation

## 2023-07-11 DIAGNOSIS — E785 Hyperlipidemia, unspecified: Secondary | ICD-10-CM | POA: Insufficient documentation

## 2023-07-11 DIAGNOSIS — R011 Cardiac murmur, unspecified: Secondary | ICD-10-CM | POA: Insufficient documentation

## 2023-07-11 DIAGNOSIS — Z87891 Personal history of nicotine dependence: Secondary | ICD-10-CM | POA: Insufficient documentation

## 2023-07-11 DIAGNOSIS — G4730 Sleep apnea, unspecified: Secondary | ICD-10-CM | POA: Insufficient documentation

## 2023-07-11 DIAGNOSIS — E119 Type 2 diabetes mellitus without complications: Secondary | ICD-10-CM | POA: Insufficient documentation

## 2023-07-11 DIAGNOSIS — Z510 Encounter for antineoplastic radiation therapy: Secondary | ICD-10-CM | POA: Diagnosis not present

## 2023-07-11 DIAGNOSIS — Z923 Personal history of irradiation: Secondary | ICD-10-CM | POA: Insufficient documentation

## 2023-07-11 DIAGNOSIS — Z79899 Other long term (current) drug therapy: Secondary | ICD-10-CM | POA: Insufficient documentation

## 2023-07-11 LAB — RAD ONC ARIA SESSION SUMMARY
Course Elapsed Days: 42
Plan Fractions Treated to Date: 7
Plan Prescribed Dose Per Fraction: 2 Gy
Plan Total Fractions Prescribed: 7
Plan Total Prescribed Dose: 14 Gy
Reference Point Dosage Given to Date: 14 Gy
Reference Point Session Dosage Given: 2 Gy
Session Number: 30

## 2023-07-11 LAB — CBC WITH DIFFERENTIAL (CANCER CENTER ONLY)
Abs Immature Granulocytes: 0.01 10*3/uL (ref 0.00–0.07)
Basophils Absolute: 0 10*3/uL (ref 0.0–0.1)
Basophils Relative: 1 %
Eosinophils Absolute: 0 10*3/uL (ref 0.0–0.5)
Eosinophils Relative: 1 %
HCT: 44.9 % (ref 39.0–52.0)
Hemoglobin: 14.4 g/dL (ref 13.0–17.0)
Immature Granulocytes: 0 %
Lymphocytes Relative: 29 %
Lymphs Abs: 1.2 10*3/uL (ref 0.7–4.0)
MCH: 25.5 pg — ABNORMAL LOW (ref 26.0–34.0)
MCHC: 32.1 g/dL (ref 30.0–36.0)
MCV: 79.5 fL — ABNORMAL LOW (ref 80.0–100.0)
Monocytes Absolute: 0.3 10*3/uL (ref 0.1–1.0)
Monocytes Relative: 8 %
Neutro Abs: 2.5 10*3/uL (ref 1.7–7.7)
Neutrophils Relative %: 61 %
Platelet Count: 219 10*3/uL (ref 150–400)
RBC: 5.65 MIL/uL (ref 4.22–5.81)
RDW: 15.7 % — ABNORMAL HIGH (ref 11.5–15.5)
WBC Count: 4.1 10*3/uL (ref 4.0–10.5)
nRBC: 0 % (ref 0.0–0.2)

## 2023-07-11 LAB — CMP (CANCER CENTER ONLY)
ALT: 17 U/L (ref 0–44)
AST: 13 U/L — ABNORMAL LOW (ref 15–41)
Albumin: 4 g/dL (ref 3.5–5.0)
Alkaline Phosphatase: 48 U/L (ref 38–126)
Anion gap: 6 (ref 5–15)
BUN: 17 mg/dL (ref 8–23)
CO2: 26 mmol/L (ref 22–32)
Calcium: 8.8 mg/dL — ABNORMAL LOW (ref 8.9–10.3)
Chloride: 107 mmol/L (ref 98–111)
Creatinine: 1.22 mg/dL (ref 0.61–1.24)
GFR, Estimated: >60 mL/min (ref >60)
Glucose, Bld: 157 mg/dL — ABNORMAL HIGH (ref 70–99)
Potassium: 4.2 mmol/L (ref 3.5–5.1)
Sodium: 139 mmol/L (ref 135–145)
Total Bilirubin: 0.6 mg/dL (ref 0.0–1.2)
Total Protein: 6.5 g/dL (ref 6.5–8.1)

## 2023-07-11 NOTE — Telephone Encounter (Signed)
 Randall Lewis

## 2023-07-11 NOTE — Progress Notes (Signed)
 [facility] at [facility] [address]  [place], [place] [address] [phone]   Interval Evaluation  Date of Service: 07/11/23 Patient Name: Randall Lewis Patient MRN: [id number] Patient DOB: 06/22/1955 Provider: Mamie Searles, MD  Identifying Statement:  Randall Lewis is a 68 y.o. male with right frontal glioblastoma    Oncologic History: Anaplastic oligodendroglioma of frontal lobe (CMS/HHS-HCC)  04/16/2005 Surgery  Craniotomy for resection of left frontal lesion. 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 Lost to follow up after 03/18/2010  05/02/2023 Presentation  Presentation to the [facility] at [facility]. Recommendations pending final pathology. Likely XRT/Temozolomide  as this is a high grade glioma. 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 Pathology reveals Infiltrating High Grade Glioma pending final molecular studies for integrated diagnosis.  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  Overall he did well with radiation, though he does continue to complain of impaired taste.  No headaches or seizures.    H+P (05/19/23) Patient presents for follow up after craniotomy, resection of recurrent primary brain tumor at [facility] on 04/18/23.  He tolerated surgery well, no ill effects.  He is functionally intact, no further headaches, no seizures.  Recently increased blood pressure medication with his PCP.  Has visit with radiation oncology upcoming as well.  Medications: Current Outpatient Medications on File Prior to Visit  Medication Sig Dispense Refill   acetaminophen  (TYLENOL ) 325 MG tablet Take 975 mg by mouth every 6 (six) hours as needed.     amLODipine (NORVASC) 5 MG tablet Take 5 mg by mouth daily.     buPROPion (WELLBUTRIN SR) 100 MG 12 hr tablet Take 100 mg by mouth every morning.     JARDIANCE 25 MG TABS tablet Take 25 mg by mouth daily.     losartan-hydrochlorothiazide  (HYZAAR) 100-12.5 MG tablet Take 1 tablet by mouth daily.     metFORMIN (GLUCOPHAGE-XR) 500 MG 24 hr tablet Take 1,000 mg by mouth daily.     metoprolol tartrate (LOPRESSOR) 12.5 mg TABS tablet Take 12.5 mg by mouth daily.     ondansetron  (ZOFRAN ) 8 MG tablet Take 1 tablet (8 mg total) by mouth every 8 (eight) hours as needed for nausea or vomiting. May take  30-60 minutes prior to Temodar  administration if nausea/vomiting occurs as needed. 30 tablet 1   oxyCODONE  (OXY IR/ROXICODONE ) 5 MG immediate release tablet Take 5  mg by mouth every 4 (four) hours as needed.     pioglitazone (ACTOS) 15 MG tablet Take 15 mg by mouth daily.     simvastatin (ZOCOR) 20 MG tablet Take 20 mg by mouth daily.     tamsulosin  (FLOMAX ) 0.4 MG CAPS capsule Take 0.4 mg by mouth daily.     temozolomide  (TEMODAR ) 140 MG capsule Take 1 capsule (140 mg total) by mouth daily. May take on an empty stomach to decrease nausea & vomiting. 42 capsule 0   temozolomide  (TEMODAR ) 20 MG capsule Take 1 capsule (20 mg total) by mouth daily. May take on an empty stomach to decrease nausea & vomiting. 42 capsule 0   No current facility-administered medications on file prior to visit.    Allergies:  Allergies  Allergen Reactions   Sildenafil Other (See Comments) and Nausea Only   Gadolinium Derivatives Nausea And Vomiting    Per pt , always get sick with gado and never offered anti nausea meds.   Past Medical History:  Past Medical History:  Diagnosis Date   BPH (benign prostatic hyperplasia)    Cancer (HCC) 2006   Brain tumor, in remission after chemo radiation and surgery   Diabetes mellitus without complication (HCC)    ED (erectile dysfunction)    Heart murmur    History of heart murmur   Hyperlipidemia    Hypertension    Sleep apnea    USES C-PAP   Past Surgical History:  Past Surgical History:  Procedure Laterality Date   BRAIN SURGERY  2006   BRAIN TUMOR  - CANCER   COLONOSCOPY     CYSTOSCOPY N/A 07/29/2015   Procedure: FLEXIBLE CYSTOSCOPY;  Surgeon: Homero Luster, MD;  Location: [facility];  Service: Urology;  Laterality: N/A;   FOOT BONE EXCISION  1989   PENILE PROSTHESIS IMPLANT N/A 07/29/2015   Procedure: PENILE PROSTHESIS;  Surgeon: Homero Luster, MD;  Location: [facility];  Service: Urology;  Laterality: N/A;   right knee meniscus repair  2010   THYROID  LOBECTOMY  05/12/2012   Procedure: THYROID  LOBECTOMY;  Surgeon: Keitha Pata, MD;  Location: [facility];  Service: General;  Laterality: Left;  left thyroid  lobectomy   Social History:   Social History   Socioeconomic History   Marital status: Married    Spouse name: [name]   Number of children: 2   Years of education: Not on file   Highest education level: Master's degree (e.g., MA, MS, MEng, MEd, MSW, MBA)  Occupational History   Not on file  Tobacco Use   Smoking status: Former    Current packs/day: 0.00    Average packs/day: 1 pack/day for 23.0 years (23.0 ttl pk-yrs)    Types: Cigarettes    Start date: 04/26/1973    Quit date: 04/26/1996    Years since quitting: 27.2   Smokeless tobacco: Never  Substance and Sexual Activity   Alcohol use: No   Drug use: No   Sexual activity: Not on file  Other Topics Concern   Not on file  Social History Narrative   Lives with wife   Right handed   Caffeine: 2 cups of coffee and 1 glass of tea/soda a day   Social Drivers of Health   Financial Resource Strain: Low Risk  (05/01/2023)   Received from [facility]  Overall Financial Resource Strain (CARDIA)    Difficulty of Paying Living Expenses: Not hard at all  Food Insecurity: No Food Insecurity (05/20/2023)   Hunger Vital Sign    Worried About Running Out of Food in the Last Year: Never true    Ran Out of Food in the Last Year: Never true  Transportation Needs: No Transportation Needs (05/20/2023)   PRAPARE - Administrator, Civil Service (Medical): No    Lack of Transportation (Non-Medical): No  Physical Activity: Not on file  Stress: Not on file  Social Connections: Not on file  Intimate Partner Violence: Not At Risk (05/20/2023)   Humiliation, Afraid, Rape, and Kick questionnaire    Fear of Current or Ex-Partner: No    Emotionally Abused: No    Physically Abused: No    Sexually Abused: No   Family History:  Family History  Problem Relation Age of Onset   Breast cancer Mother    Cancer Brother 69   Heart attack Brother    Heart attack Brother    Birth defects Son    Colon cancer Neg Hx    Colon polyps Neg Hx     Esophageal cancer Neg Hx    Stomach cancer Neg Hx    Rectal cancer Neg Hx     Review of Systems: Constitutional: Doesn't report fevers, chills or abnormal weight loss Eyes: Doesn't report blurriness of vision Ears, nose, mouth, throat, and face: Doesn't report sore throat Respiratory: Doesn't report cough, dyspnea or wheezes Cardiovascular: Doesn't report palpitation, chest discomfort  Gastrointestinal:  Doesn't report nausea, constipation, diarrhea GU: Doesn't report incontinence Skin: Doesn't report skin rashes Neurological: Per HPI Musculoskeletal: Doesn't report joint pain Behavioral/Psych: Doesn't report anxiety  Physical Exam: Vitals:   07/11/23 0957  BP: 127/75  Pulse: (!) 55  Resp: 16  Temp: (!) 97.4 F (36.3 C)  SpO2: 99%    KPS: 90. General: Alert, cooperative, pleasant, in no acute distress Head: Normal EENT: No conjunctival injection or scleral icterus.  Lungs: Resp effort normal Cardiac: Regular rate Abdomen: Non-distended abdomen Skin: No rashes cyanosis or petechiae. Extremities: No clubbing or edema  Neurologic Exam: Mental Status: Awake, alert, attentive to examiner. Oriented to self and environment. Language is fluent with intact comprehension.  Cranial Nerves: Visual acuity is grossly normal. Visual fields are full. Extra-ocular movements intact. No ptosis. Face is symmetric Motor: Tone and bulk are normal. Power is full in both arms and legs. Reflexes are symmetric, no pathologic reflexes present.  Sensory: Intact to light touch Gait: Normal.   Labs: I have reviewed the data as listed    Component Value Date/Time   NA 139 07/11/2023 0844   K 4.2 07/11/2023 0844   CL 107 07/11/2023 0844   CO2 26 07/11/2023 0844   GLUCOSE 157 (H) 07/11/2023 0844   BUN 17 07/11/2023 0844   CREATININE 1.22 07/11/2023 0844   CREATININE 1.31 (H) 02/25/2012 1438   CALCIUM 8.8 (L) 07/11/2023 0844   PROT 6.5 07/11/2023 0844   ALBUMIN 4.0 07/11/2023 0844   AST  13 (L) 07/11/2023 0844   ALT 17 07/11/2023 0844   ALKPHOS 48 07/11/2023 0844   BILITOT 0.6 07/11/2023 0844   GFRNONAA >60 07/11/2023 0844   GFRNONAA >60 02/25/2012 1438   GFRAA >60 08/14/2019 1438   GFRAA >60 02/25/2012 1438   Lab Results  Component Value Date   WBC 4.1 07/11/2023   NEUTROABS 2.5 07/11/2023   HGB 14.4 07/11/2023   HCT 44.9  07/11/2023   MCV 79.5 (L) 07/11/2023   PLT 219 07/11/2023    Assessment/Plan Glioblastoma, IDH-wildtype (HCC)  Randall Lewis is clinically stable today, now having completed 6 weeks of radiation and Temodar .  Labs remain within normal limits.  We ultimately recommended completing course of intensity modulated radiation therapy and concurrent daily Temozolomide .  Radiation will be administered Mon-Fri over 6 weeks, Temodar  will be dosed at 75mg /m2 to be given daily over 42 days.  We reviewed side effects of temodar , including fatigue, nausea/vomiting, constipation, and cytopenias.  Chemotherapy should be held for the following:  ANC less than 1,000  Platelets less than 100,000  LFT or creatinine greater than 2x ULN  If clinical concerns/contraindications develop  He will obtain MRI and meet with [facility] team next month.  We ask that Randall Lewis return to clinic in 1 months following next brain MRI, or sooner as needed.  All questions were answered. The patient knows to call the clinic with any problems, questions or concerns. No barriers to learning were detected.  The total time spent in the encounter was 30 minutes and more than 50% was on counseling and review of test results   Mamie Searles, MD Medical Director of Neuro-Oncology [facility] at [place] [facility] 07/11/23 9:42 AM

## 2023-07-12 DIAGNOSIS — R82998 Other abnormal findings in urine: Secondary | ICD-10-CM | POA: Diagnosis not present

## 2023-07-12 DIAGNOSIS — D649 Anemia, unspecified: Secondary | ICD-10-CM | POA: Diagnosis not present

## 2023-07-12 NOTE — Radiation Completion Notes (Addendum)
  Radiation Oncology         [phone] ________________________________  Name: Randall Lewis MRN: [id number]  Date of Service: 07/11/2023  DOB: [birth year]  End of Treatment Note   Diagnosis: Right parieto occipital glioblastoma   Intent: Curative     ==========DELIVERED PLANS==========  First Treatment Date: 2023-05-30 Last Treatment Date: 2023-07-11   Plan Name: Brain_R Site: Brain Technique: IMRT Mode: Photon Dose Per Fraction: 2 Gy Prescribed Dose (Delivered / Prescribed): 46 Gy / 46 Gy Prescribed Fxs (Delivered / Prescribed): 23 / 23   Plan Name: Brain_R_Bst Site: Brain Technique: IMRT Mode: Photon Dose Per Fraction: 2 Gy Prescribed Dose (Delivered / Prescribed): 14 Gy / 14 Gy Prescribed Fxs (Delivered / Prescribed): 7 / 7     ==========ON TREATMENT VISIT DATES========== 2023-06-03, 2023-06-10, 2023-06-17, 2023-06-24, 2023-07-01, 2023-07-08   See weekly On Treatment Notes in Epic for details in the Media tab (listed as Progress notes on the On Treatment Visit Dates listed above). The patient tolerated radiation. He developed fatigue and anticipated skin changes in the treatment field.   The patient will receive a call in about one month from the radiation oncology department. He will continue follow up with Dr. Barbaraann Cao as well.      Osker Mason, PAC

## 2023-07-13 DIAGNOSIS — R339 Retention of urine, unspecified: Secondary | ICD-10-CM | POA: Diagnosis not present

## 2023-07-19 ENCOUNTER — Encounter: Payer: Self-pay | Admitting: Internal Medicine

## 2023-08-01 ENCOUNTER — Encounter: Payer: Self-pay | Admitting: Internal Medicine

## 2023-08-03 ENCOUNTER — Encounter: Payer: Self-pay | Admitting: Internal Medicine

## 2023-08-04 DIAGNOSIS — C719 Malignant neoplasm of brain, unspecified: Secondary | ICD-10-CM | POA: Diagnosis not present

## 2023-08-04 DIAGNOSIS — Z9889 Other specified postprocedural states: Secondary | ICD-10-CM | POA: Diagnosis not present

## 2023-08-04 DIAGNOSIS — Z923 Personal history of irradiation: Secondary | ICD-10-CM | POA: Diagnosis not present

## 2023-08-04 DIAGNOSIS — G939 Disorder of brain, unspecified: Secondary | ICD-10-CM | POA: Diagnosis not present

## 2023-08-04 DIAGNOSIS — C711 Malignant neoplasm of frontal lobe: Secondary | ICD-10-CM | POA: Diagnosis not present

## 2023-08-04 DIAGNOSIS — C712 Malignant neoplasm of temporal lobe: Secondary | ICD-10-CM | POA: Diagnosis not present

## 2023-08-04 DIAGNOSIS — Z9221 Personal history of antineoplastic chemotherapy: Secondary | ICD-10-CM | POA: Diagnosis not present

## 2023-08-05 ENCOUNTER — Encounter: Payer: Self-pay | Admitting: Internal Medicine

## 2023-08-08 ENCOUNTER — Ambulatory Visit
Admission: RE | Admit: 2023-08-08 | Discharge: 2023-08-08 | Disposition: A | Discharge status: Home / Self Care | Payer: Self-pay | Source: Ambulatory Visit | Attending: Internal Medicine | Admitting: Internal Medicine

## 2023-08-08 ENCOUNTER — Inpatient Hospital Stay
Admission: RE | Admit: 2023-08-08 | Discharge: 2023-08-08 | Disposition: A | Discharge status: Home / Self Care | Payer: Self-pay | Source: Ambulatory Visit | Attending: Internal Medicine | Admitting: Internal Medicine

## 2023-08-08 ENCOUNTER — Other Ambulatory Visit: Payer: Self-pay | Admitting: *Deleted

## 2023-08-08 DIAGNOSIS — C719 Malignant neoplasm of brain, unspecified: Secondary | ICD-10-CM

## 2023-08-08 NOTE — Progress Notes (Signed)
  Radiation Oncology         [phone]) [phone] ________________________________  Name: Randall Lewis MRN: [id number]  Date of Service: 08/08/2023  DOB: [birth year]  Post Treatment Telephone Note  Diagnosis:  Right parieto occipital glioblastoma (as documented in provider EOT note)  The patient was available for call today.  The patient did  note fatigue during radiation. The patient did not note hair loss or skin changes in the field of radiation during therapy. The patient is not taking dexamethasone. The patient does not have symptoms of  weakness or loss of control of the extremities. The patient does not have symptoms of headache. The patient does not have symptoms of seizure or uncontrolled movement. The patient does not have symptoms of changes in vision. The patient does not have changes in speech. The patient does not have confusion.   The patient was counseled that he  will be contacted by our brain and spine navigator to schedule surveillance imaging. The patient was encouraged to call if he  have not received a call to schedule imaging, or if he  develops concerns or questions regarding radiation. The patient will also continue to follow up with Dr. Barbaraann Cao in medical oncology.   This concludes the interaction.  Ruel Favors, LPN

## 2023-08-08 NOTE — Progress Notes (Signed)
 Requested [facility] push MRI Brain dated 08/05/2023 to [facility] and requested that IT link to outside film.

## 2023-08-09 ENCOUNTER — Other Ambulatory Visit (HOSPITAL_COMMUNITY): Payer: Self-pay

## 2023-08-09 ENCOUNTER — Inpatient Hospital Stay: Payer: Self-pay

## 2023-08-09 ENCOUNTER — Encounter: Payer: Self-pay | Admitting: Internal Medicine

## 2023-08-09 ENCOUNTER — Other Ambulatory Visit: Payer: Self-pay

## 2023-08-09 ENCOUNTER — Other Ambulatory Visit: Payer: Self-pay | Admitting: Pharmacy Technician

## 2023-08-09 ENCOUNTER — Telehealth: Payer: Self-pay | Admitting: Pharmacist

## 2023-08-09 ENCOUNTER — Inpatient Hospital Stay: Payer: Self-pay | Attending: Internal Medicine | Admitting: Internal Medicine

## 2023-08-09 VITALS — BP 125/77 | HR 60 | Temp 97.6°F | Resp 13 | Wt 230.6 lb

## 2023-08-09 DIAGNOSIS — Z803 Family history of malignant neoplasm of breast: Secondary | ICD-10-CM | POA: Diagnosis not present

## 2023-08-09 DIAGNOSIS — Z87891 Personal history of nicotine dependence: Secondary | ICD-10-CM | POA: Diagnosis not present

## 2023-08-09 DIAGNOSIS — Z923 Personal history of irradiation: Secondary | ICD-10-CM | POA: Diagnosis not present

## 2023-08-09 DIAGNOSIS — C711 Malignant neoplasm of frontal lobe: Secondary | ICD-10-CM | POA: Diagnosis not present

## 2023-08-09 DIAGNOSIS — C719 Malignant neoplasm of brain, unspecified: Secondary | ICD-10-CM

## 2023-08-09 LAB — CBC WITH DIFFERENTIAL (CANCER CENTER ONLY)
Abs Immature Granulocytes: 0.03 10*3/uL (ref 0.00–0.07)
Basophils Absolute: 0 10*3/uL (ref 0.0–0.1)
Basophils Relative: 1 %
Eosinophils Absolute: 0 10*3/uL (ref 0.0–0.5)
Eosinophils Relative: 0 %
HCT: 49.3 % (ref 39.0–52.0)
Hemoglobin: 16 g/dL (ref 13.0–17.0)
Immature Granulocytes: 1 %
Lymphocytes Relative: 22 %
Lymphs Abs: 1.2 10*3/uL (ref 0.7–4.0)
MCH: 25.1 pg — ABNORMAL LOW (ref 26.0–34.0)
MCHC: 32.5 g/dL (ref 30.0–36.0)
MCV: 77.4 fL — ABNORMAL LOW (ref 80.0–100.0)
Monocytes Absolute: 0.4 10*3/uL (ref 0.1–1.0)
Monocytes Relative: 7 %
Neutro Abs: 3.7 10*3/uL (ref 1.7–7.7)
Neutrophils Relative %: 69 %
Platelet Count: 210 10*3/uL (ref 150–400)
RBC: 6.37 MIL/uL — ABNORMAL HIGH (ref 4.22–5.81)
RDW: 16.1 % — ABNORMAL HIGH (ref 11.5–15.5)
WBC Count: 5.3 10*3/uL (ref 4.0–10.5)
nRBC: 0 % (ref 0.0–0.2)

## 2023-08-09 LAB — CMP (CANCER CENTER ONLY)
ALT: 20 U/L (ref 0–44)
AST: 13 U/L — ABNORMAL LOW (ref 15–41)
Albumin: 4.4 g/dL (ref 3.5–5.0)
Alkaline Phosphatase: 48 U/L (ref 38–126)
Anion gap: 6 (ref 5–15)
BUN: 22 mg/dL (ref 8–23)
CO2: 29 mmol/L (ref 22–32)
Calcium: 9.2 mg/dL (ref 8.9–10.3)
Chloride: 104 mmol/L (ref 98–111)
Creatinine: 1.17 mg/dL (ref 0.61–1.24)
GFR, Estimated: >60 mL/min (ref >60)
Glucose, Bld: 156 mg/dL — ABNORMAL HIGH (ref 70–99)
Potassium: 4 mmol/L (ref 3.5–5.1)
Sodium: 139 mmol/L (ref 135–145)
Total Bilirubin: 0.6 mg/dL (ref 0.0–1.2)
Total Protein: 6.9 g/dL (ref 6.5–8.1)

## 2023-08-09 MED ORDER — TEMOZOLOMIDE 140 MG PO CAPS: 140.0000 mg | ORAL_CAPSULE | Freq: Every day | ORAL | 0 refills | Status: DC

## 2023-08-09 MED ORDER — TEMOZOLOMIDE 100 MG PO CAPS
200.0000 mg | ORAL_CAPSULE | Freq: Every day | ORAL | 0 refills | Status: DC
  Filled 2023-08-09: qty 10, 28d supply, fill #0

## 2023-08-09 MED ORDER — TEMOZOLOMIDE 140 MG PO CAPS
140.0000 mg | ORAL_CAPSULE | Freq: Every day | ORAL | 0 refills | Status: DC
  Filled 2023-08-09: qty 5, 28d supply, fill #0

## 2023-08-09 MED ORDER — ONDANSETRON HCL 8 MG PO TABS
8.0000 mg | ORAL_TABLET | Freq: Three times a day (TID) | ORAL | 1 refills | Status: DC | PRN
  Filled 2023-08-09: qty 30, 10d supply, fill #0

## 2023-08-09 MED ORDER — TEMOZOLOMIDE 100 MG PO CAPS: 200.0000 mg | ORAL_CAPSULE | Freq: Every day | ORAL | 0 refills | Status: DC

## 2023-08-09 NOTE — Telephone Encounter (Signed)
 Oral Oncology Pharmacist Encounter  Received new prescription for Temodar (temozolomide) for the maintenance treatment of glioblastoma, IDH WT, planned duration 6-12 cycles.  CBC w/ Diff and CMP from 08/09/23 assessed, noted Scr of 1.17 mg/dL (CrCl ~52 mL/min) - no baseline dose adjustments required. Prescription dose and frequency assessed for appropriateness.  Current medication list in Epic reviewed, no relevant/significant DDIs with Temodar identified.  Evaluated chart and no patient barriers to medication adherence noted.   Patient agreement for treatment documented in MD note on 08/09/23.  Prescription has been e-scribed to the [facility] for benefits analysis and approval.  Oral Oncology Clinic will continue to follow for insurance authorization, copayment issues, initial counseling and start date.  Sherry Ruffing, PharmD, BCPS, BCOP Hematology/Oncology Clinical Pharmacist [facility] Oral Chemotherapy Navigation Clinics [phone] 08/09/2023 2:30 PM

## 2023-08-09 NOTE — Progress Notes (Signed)
 Oral Oncology Patient Advocate Encounter  After completing a benefits investigation, prior authorization for temozolomide is not required at this time as patient does not currently have active coverage. Patient is aware it will be cash pricing for this fill and stated their T[employer] should be active again before their refill.  Patient's cost is ~$552. Cost may vary slightly depending on NDC of medication available at pharmacy   Omer Jack, CPhT-Adv Oncology Pharmacy Patient Advocate [facility]  Direct Number: [phone]  Fax: [phone]

## 2023-08-09 NOTE — Progress Notes (Signed)
 Specialty Pharmacy Initial Fill Coordination Note  Randall Lewis is a 68 y.o. male contacted today regarding refills of specialty medication(s) Temozolomide (TEMODAR) .  Patient requested Randall Lewis at [facility] at [place]  on 08/10/23   Medication will be filled on 08/09/23.   Patient is aware of $523.38 copayment.

## 2023-08-09 NOTE — Progress Notes (Signed)
 Oral Chemotherapy Pharmacist Encounter  Patient was counseled under telephone encounter from 08/09/23.  Sherry Ruffing, PharmD, BCPS, BCOP Hematology/Oncology Clinical Pharmacist [facility] and [facility] Oral Chemotherapy Navigation Clinics [phone] 08/09/2023 3:01 PM

## 2023-08-09 NOTE — Telephone Encounter (Signed)
 Oral Chemotherapy Pharmacist Encounter  I spoke with patient for overview of: Temodar (temozolomide) for the maintenance treatment of glioblastoma, IDH WT, planned duration 6-12 cycles.   Counseled on administration, dosing, side effects, monitoring, drug-food interactions, safe handling, storage, and disposal.  Patient will take Temodar 100mg  capsules and Temodar 140mg  capsules, 340mg  total daily dose, by mouth once daily, may take at bedtime and on an empty stomach to decrease nausea and vomiting.  Patient will take Temodar daily for 5 days on, 23 days off, and repeated.  Temodar start date: 08/10/23 PM    Adverse effects include but are not limited to: nausea, vomiting, anorexia, GI upset, rash, and fatigue. N/V PPX: Patient will take Zofran 8mg  tablet, 1 tablet by mouth 30-60 min prior to Temodar dose to help decrease N/V.  Reviewed importance of keeping a medication schedule and plan for any missed doses. No barriers to medication adherence identified.  Medication reconciliation performed and medication/allergy list updated.  All questions answered.  Randall Lewis voiced understanding and appreciation.   Medication education handout placed in mail for patient. Patient knows to call the office with questions or concerns. Oral Chemotherapy Clinic phone number provided to patient.   Randall Lewis, PharmD, BCPS, BCOP Hematology/Oncology Clinical Pharmacist Randall Lewis and [facility] Oral Chemotherapy Navigation Clinics [phone] 08/09/2023 2:56 PM

## 2023-08-09 NOTE — Progress Notes (Signed)
 [facility] at [facility] [address]  [place], [place] [address] [phone]   Interval Evaluation  Date of Service: 08/09/23 Patient Name: Randall Lewis Patient MRN: [id number] Patient DOB: 1955/09/15 Provider: Henreitta Leber, MD  Identifying Statement:  Randall Lewis is a 68 y.o. male with right frontal glioblastoma    Oncologic History: Anaplastic oligodendroglioma of frontal lobe (CMS/HHS-HCC)  04/16/2005 Surgery  Craniotomy for resection of left frontal lesion. 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 Lost to follow up after 03/18/2010  05/02/2023 Presentation  Presentation to the [facility] at [facility]. Recommendations pending final pathology. Likely XRT/Temozolomide as this is a high grade glioma. 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 Pathology reveals Infiltrating High Grade Glioma pending final molecular studies for integrated diagnosis.  Molecular data (see molecular reports for more details): IDH mutation is NOT detected (PGDx elio Solid Tumor NGS Panel) H3 mutation is NOT detected (PGDx elio Solid Tumor NGS Panel) 1p/19q codeletion is NOT detected (chromosomal microarray) Chromothripsis of chromosomes 7 and 13 is detected (chromosomal microarray) Chromosome 10 loss with PTEN loss is detected (chromosomal microarray) Chromosome 9  segmental loss with homozygous loss of CDKN2A/B is detected (chromosomal microarray) TERT promoter mutation is detected ([facility]Dx elio Solid Tumor NGS Panel) EGFRvIII is detected ([facility]Dx elio Solid Tumor NGS Panel) EGFR amplification is detected (chromosomal microarray and PGDx elio Solid Tumor NGS Panel) MDM2 amplification is detected (chromosomal microarray and PGDx elio Solid Tumor NGS Panel)    Interval History: Randall Lewis presents today for follow up, now having completed IMRT and Temodar, MRI study.  Overall he did well with radiation and Temodar.  No new or progressive changes reported today.  No headaches or seizures.    H+P (05/19/23) Patient presents for follow up after craniotomy, resection of recurrent primary brain tumor at [facility] on 04/18/23.  He tolerated surgery well, no ill effects.  He is functionally intact, no further headaches, no seizures.  Recently increased blood pressure medication with his PCP.  Has visit with radiation oncology upcoming as well.  Medications: Current Outpatient Medications on File Prior to Visit  Medication Sig Dispense Refill   acetaminophen (TYLENOL) 325 MG tablet Take 975 mg by mouth every 6 (six) hours as needed.     amLODipine (NORVASC) 5 MG tablet Take 5 mg by mouth daily.     buPROPion (WELLBUTRIN SR) 100 MG 12 hr tablet Take 100 mg by mouth every morning.     JARDIANCE 25 MG TABS tablet Take 25 mg by mouth daily.     losartan-hydrochlorothiazide (HYZAAR) 100-12.5 MG tablet Take 1 tablet by mouth daily.     metFORMIN (GLUCOPHAGE-XR) 500 MG 24 hr tablet Take 1,000 mg by mouth daily.     metoprolol tartrate (LOPRESSOR) 12.5 mg TABS tablet Take 12.5 mg by mouth daily.     ondansetron (ZOFRAN) 8 MG tablet Take 1 tablet (8 mg total) by mouth every 8 (eight) hours as needed for nausea or vomiting. May  take 30-60 minutes prior to Temodar administration if nausea/vomiting occurs as needed. 30 tablet 1   oxyCODONE (OXY IR/ROXICODONE) 5 MG immediate  release tablet Take 5 mg by mouth every 4 (four) hours as needed.     pioglitazone (ACTOS) 15 MG tablet Take 15 mg by mouth daily.     simvastatin (ZOCOR) 20 MG tablet Take 20 mg by mouth daily.     tamsulosin (FLOMAX) 0.4 MG CAPS capsule Take 0.4 mg by mouth daily.     No current facility-administered medications on file prior to visit.    Allergies:  Allergies  Allergen Reactions   Sildenafil Other (See Comments) and Nausea Only   Gadolinium Derivatives Nausea And Vomiting    Per pt , always get sick with gado and never offered anti nausea meds.   Past Medical History:  Past Medical History:  Diagnosis Date   BPH (benign prostatic hyperplasia)    Cancer (HCC) 2006   Brain tumor, in remission after chemo radiation and surgery   Diabetes mellitus without complication (HCC)    ED (erectile dysfunction)    Heart murmur    History of heart murmur   Hyperlipidemia    Hypertension    Sleep apnea    USES C-PAP   Past Surgical History:  Past Surgical History:  Procedure Laterality Date   BRAIN SURGERY  2006   BRAIN TUMOR  - CANCER   COLONOSCOPY     CYSTOSCOPY N/A 07/29/2015   Procedure: FLEXIBLE CYSTOSCOPY;  Surgeon: Bjorn Pippin, MD;  Location: [facility];  Service: Urology;  Laterality: N/A;   FOOT BONE EXCISION  1989   PENILE PROSTHESIS IMPLANT N/A 07/29/2015   Procedure: PENILE PROSTHESIS;  Surgeon: Bjorn Pippin, MD;  Location: [facility];  Service: Urology;  Laterality: N/A;   right knee meniscus repair  2010   THYROID LOBECTOMY  05/12/2012   Procedure: THYROID LOBECTOMY;  Surgeon: Velora Heckler, MD;  Location: [facility];  Service: General;  Laterality: Left;  left thyroid lobectomy   Social History:  Social History   Socioeconomic History   Marital status: Married    Spouse name: [name]   Number of children: 2   Years of education: Not on file   Highest education level: Master's degree (e.g., MA, MS, MEng, MEd, MSW, MBA)  Occupational History   Not on file  Tobacco Use    Smoking status: Former    Current packs/day: 0.00    Average packs/day: 1 pack/day for 23.0 years (23.0 ttl pk-yrs)    Types: Cigarettes    Start date: 04/26/1973    Quit date: 04/26/1996    Years since quitting: 27.3   Smokeless tobacco: Never  Substance and Sexual Activity   Alcohol use: No   Drug use: No   Sexual activity: Not on file  Other Topics Concern   Not on file  Social History Narrative   Lives with wife   Right handed   Caffeine: 2 cups of coffee and 1 glass of tea/soda a day   Social Drivers of Corporate investment banker Strain: Low Risk  (05/01/2023)   Received from [facility]   Overall Financial Resource Strain (CARDIA)    Difficulty of Paying Living Expenses: Not hard at all  Food Insecurity: No Food Insecurity (05/20/2023)   Hunger Vital Sign    Worried About Running Out of Food in the Last Year: Never true    Ran Out of Food in the Last Year: Never true  Transportation Needs: No Transportation Needs (05/20/2023)   PRAPARE - Administrator, Civil Service (Medical): No    Lack of Transportation (Non-Medical): No  Physical Activity: Not on file  Stress: Not on file  Social Connections: Not on file  Intimate Partner Violence: Not At Risk (05/20/2023)   Humiliation, Afraid, Rape, and Kick questionnaire    Fear of Current or Ex-Partner: No    Emotionally Abused: No    Physically Abused: No    Sexually Abused: No   Family History:  Family History  Problem Relation Age of Onset   Breast cancer Mother    Cancer Brother 70   Heart attack Brother    Heart attack Brother    Birth defects Son    Colon cancer Neg Hx    Colon polyps Neg Hx    Esophageal cancer Neg Hx    Stomach cancer Neg Hx    Rectal cancer Neg Hx     Review of Systems: Constitutional: Doesn't report fevers, chills or abnormal weight loss Eyes: Doesn't report blurriness of vision Ears, nose, mouth, throat, and face: Doesn't report sore  throat Respiratory: Doesn't report cough, dyspnea or wheezes Cardiovascular: Doesn't report palpitation, chest discomfort  Gastrointestinal:  Doesn't report nausea, constipation, diarrhea GU: Doesn't report incontinence Skin: Doesn't report skin rashes Neurological: Per HPI Musculoskeletal: Doesn't report joint pain Behavioral/Psych: Doesn't report anxiety  Physical Exam: Vitals:   08/09/23 1128  BP: 125/77  Pulse: 60  Resp: 13  Temp: 97.6 F (36.4 C)  SpO2: 99%    KPS: 90. General: Alert, cooperative, pleasant, in no acute distress Head: Normal EENT: No conjunctival injection or scleral icterus.  Lungs: Resp effort normal Cardiac: Regular rate Abdomen: Non-distended abdomen Skin: No rashes cyanosis or petechiae. Extremities: No clubbing or edema  Neurologic Exam: Mental Status: Awake, alert, attentive to examiner. Oriented to self and environment. Language is fluent with intact comprehension.  Cranial Nerves: Visual acuity is grossly normal. Visual fields are full. Extra-ocular movements intact. No ptosis. Face is symmetric Motor: Tone and bulk are normal. Power is full in both arms and legs. Reflexes are symmetric, no pathologic reflexes present.  Sensory: Intact to light touch Gait: Normal.   Labs: I have reviewed the data as listed    Component Value Date/Time   NA 139 07/11/2023 0844   K 4.2 07/11/2023 0844   CL 107 07/11/2023 0844   CO2 26 07/11/2023 0844   GLUCOSE 157 (H) 07/11/2023 0844   BUN 17 07/11/2023 0844   CREATININE 1.22 07/11/2023 0844   CREATININE 1.31 (H) 02/25/2012 1438   CALCIUM 8.8 (L) 07/11/2023 0844   PROT 6.5 07/11/2023 0844   ALBUMIN 4.0 07/11/2023 0844   AST 13 (L) 07/11/2023 0844   ALT 17 07/11/2023 0844   ALKPHOS 48 07/11/2023 0844   BILITOT 0.6 07/11/2023 0844   GFRNONAA >60 07/11/2023 0844   GFRNONAA >60 02/25/2012 1438   GFRAA >60 08/14/2019 1438   GFRAA >60 02/25/2012 1438   Lab Results  Component Value Date   WBC 4.1  07/11/2023   NEUTROABS 2.5 07/11/2023   HGB 14.4 07/11/2023   HCT 44.9 07/11/2023   MCV 79.5 (L) 07/11/2023   PLT 219 07/11/2023   Imaging:  [facility] Clinician Interpretation: I have personally reviewed the CNS images as listed.  My interpretation, in the context of the patient's clinical presentation, is likely treatment effect per review of outside MRI from 08/02/23  No results found.  Assessment/Plan Glioblastoma, IDH-wildtype (HCC)  Randall Lewis is clinically stable today, now having completed full course of radiation and Temodar.  MRI brain demonstrates stable findings or consistent with mild post-RT effects.  Nodular enhancement was actually present on 05/24/23 study prior to RT, which was not available for the [facility] team to review.  We recommended initiating treatment with cycle #1 Temozolomide 150mg /m2, on for five days and off for twenty three days in twenty eight day cycles. The patient will have a complete blood count performed on days 21 and 28 of each cycle, and a comprehensive metabolic panel performed on day 28 of each cycle. Labs may need to be performed more often. Zofran will prescribed for home use for nausea/vomiting.   Informed consent was obtained verbally at bedside to proceed with oral chemotherapy.  Chemotherapy should be held for the following:  ANC less than 1,000  Platelets less than 100,000  LFT or creatinine greater than 2x ULN  If clinical concerns/contraindications develop  We ask that Randall Lewis return to clinic in 1 months prior to cycle #2, or sooner as needed.  All questions were answered. The patient knows to call the clinic with any problems, questions or concerns. No barriers to learning were detected.  The total time spent in the encounter was 40 minutes and more than 50% was on counseling and review of test results   Henreitta Leber, MD Medical Director of Neuro-Oncology [facility] at [place] 08/09/23 11:35 AM

## 2023-08-10 ENCOUNTER — Other Ambulatory Visit: Payer: Self-pay

## 2023-08-10 ENCOUNTER — Telehealth: Payer: Self-pay | Admitting: Internal Medicine

## 2023-08-10 ENCOUNTER — Other Ambulatory Visit (HOSPITAL_COMMUNITY): Payer: Self-pay

## 2023-08-10 NOTE — Telephone Encounter (Signed)
 Patient mentioned the time slot provided was not enough time for him to complete treatment. Randall Lewis scheduled 2 weeks after the expected scheduling date per his request.

## 2023-08-12 ENCOUNTER — Encounter: Payer: Self-pay | Admitting: Internal Medicine

## 2023-08-19 ENCOUNTER — Encounter: Payer: Self-pay | Admitting: Gastroenterology

## 2023-08-22 DIAGNOSIS — R8290 Unspecified abnormal findings in urine: Secondary | ICD-10-CM | POA: Diagnosis not present

## 2023-08-22 DIAGNOSIS — R1030 Lower abdominal pain, unspecified: Secondary | ICD-10-CM | POA: Diagnosis not present

## 2023-09-01 ENCOUNTER — Other Ambulatory Visit (HOSPITAL_COMMUNITY): Payer: Self-pay

## 2023-09-02 ENCOUNTER — Telehealth: Payer: Self-pay | Admitting: *Deleted

## 2023-09-02 NOTE — Telephone Encounter (Signed)
 Medical Clearance Request for this patient signed by Dr Barbaraann Cao & faxed to [facility], [phone].  Fax confirmation received.

## 2023-09-10 DIAGNOSIS — G4733 Obstructive sleep apnea (adult) (pediatric): Secondary | ICD-10-CM | POA: Diagnosis not present

## 2023-09-13 ENCOUNTER — Encounter: Payer: Self-pay | Admitting: Internal Medicine

## 2023-09-15 ENCOUNTER — Other Ambulatory Visit: Payer: Self-pay

## 2023-09-16 ENCOUNTER — Other Ambulatory Visit: Payer: Self-pay

## 2023-09-20 ENCOUNTER — Other Ambulatory Visit: Payer: Self-pay

## 2023-09-20 ENCOUNTER — Inpatient Hospital Stay (HOSPITAL_BASED_OUTPATIENT_CLINIC_OR_DEPARTMENT_OTHER): Payer: Self-pay | Admitting: Internal Medicine

## 2023-09-20 ENCOUNTER — Encounter: Payer: Self-pay | Admitting: Internal Medicine

## 2023-09-20 ENCOUNTER — Telehealth: Payer: Self-pay | Admitting: Internal Medicine

## 2023-09-20 ENCOUNTER — Other Ambulatory Visit (HOSPITAL_COMMUNITY): Payer: Self-pay

## 2023-09-20 ENCOUNTER — Inpatient Hospital Stay: Payer: Self-pay | Attending: Internal Medicine

## 2023-09-20 ENCOUNTER — Telehealth: Payer: Self-pay | Admitting: Pharmacist

## 2023-09-20 VITALS — BP 133/80 | HR 54 | Temp 97.9°F | Resp 15 | Wt 233.3 lb

## 2023-09-20 DIAGNOSIS — C719 Malignant neoplasm of brain, unspecified: Secondary | ICD-10-CM | POA: Diagnosis not present

## 2023-09-20 DIAGNOSIS — R112 Nausea with vomiting, unspecified: Secondary | ICD-10-CM | POA: Insufficient documentation

## 2023-09-20 DIAGNOSIS — Z87891 Personal history of nicotine dependence: Secondary | ICD-10-CM | POA: Insufficient documentation

## 2023-09-20 DIAGNOSIS — C711 Malignant neoplasm of frontal lobe: Secondary | ICD-10-CM | POA: Diagnosis present

## 2023-09-20 DIAGNOSIS — Z79899 Other long term (current) drug therapy: Secondary | ICD-10-CM | POA: Diagnosis not present

## 2023-09-20 LAB — CBC WITH DIFFERENTIAL (CANCER CENTER ONLY)
Abs Immature Granulocytes: 0.01 10*3/uL (ref 0.00–0.07)
Basophils Absolute: 0 10*3/uL (ref 0.0–0.1)
Basophils Relative: 1 %
Eosinophils Absolute: 0 10*3/uL (ref 0.0–0.5)
Eosinophils Relative: 1 %
HCT: 48 % (ref 39.0–52.0)
Hemoglobin: 15.9 g/dL (ref 13.0–17.0)
Immature Granulocytes: 0 %
Lymphocytes Relative: 21 %
Lymphs Abs: 0.9 10*3/uL (ref 0.7–4.0)
MCH: 25.3 pg — ABNORMAL LOW (ref 26.0–34.0)
MCHC: 33.1 g/dL (ref 30.0–36.0)
MCV: 76.4 fL — ABNORMAL LOW (ref 80.0–100.0)
Monocytes Absolute: 0.3 10*3/uL (ref 0.1–1.0)
Monocytes Relative: 7 %
Neutro Abs: 3.1 10*3/uL (ref 1.7–7.7)
Neutrophils Relative %: 70 %
Platelet Count: 188 10*3/uL (ref 150–400)
RBC: 6.28 MIL/uL — ABNORMAL HIGH (ref 4.22–5.81)
RDW: 17.4 % — ABNORMAL HIGH (ref 11.5–15.5)
WBC Count: 4.4 10*3/uL (ref 4.0–10.5)
nRBC: 0 % (ref 0.0–0.2)

## 2023-09-20 LAB — CMP (CANCER CENTER ONLY)
ALT: 16 U/L (ref 0–44)
AST: 14 U/L — ABNORMAL LOW (ref 15–41)
Albumin: 4 g/dL (ref 3.5–5.0)
Alkaline Phosphatase: 51 U/L (ref 38–126)
Anion gap: 6 (ref 5–15)
BUN: 16 mg/dL (ref 8–23)
CO2: 28 mmol/L (ref 22–32)
Calcium: 8.8 mg/dL — ABNORMAL LOW (ref 8.9–10.3)
Chloride: 105 mmol/L (ref 98–111)
Creatinine: 1.21 mg/dL (ref 0.61–1.24)
GFR, Estimated: >60 mL/min (ref >60)
Glucose, Bld: 203 mg/dL — ABNORMAL HIGH (ref 70–99)
Potassium: 3.9 mmol/L (ref 3.5–5.1)
Sodium: 139 mmol/L (ref 135–145)
Total Bilirubin: 0.8 mg/dL (ref 0.0–1.2)
Total Protein: 6.4 g/dL — ABNORMAL LOW (ref 6.5–8.1)

## 2023-09-20 MED ORDER — TEMOZOLOMIDE 100 MG PO CAPS
100.0000 mg | ORAL_CAPSULE | Freq: Every day | ORAL | 0 refills | Status: DC
  Filled 2023-09-20: qty 5, 28d supply, fill #0

## 2023-09-20 MED ORDER — TEMOZOLOMIDE 100 MG PO CAPS: 100.0000 mg | ORAL_CAPSULE | Freq: Every day | ORAL | 0 refills | Status: DC

## 2023-09-20 MED ORDER — TEMOZOLOMIDE 180 MG PO CAPS: 360.0000 mg | ORAL_CAPSULE | Freq: Every day | ORAL | 0 refills | Status: DC

## 2023-09-20 MED ORDER — TEMOZOLOMIDE 180 MG PO CAPS
360.0000 mg | ORAL_CAPSULE | Freq: Every day | ORAL | 0 refills | Status: DC
  Filled 2023-09-20: qty 10, 28d supply, fill #0

## 2023-09-20 MED ORDER — PREDNISONE 50 MG PO TABS: ORAL_TABLET | ORAL | 0 refills | Status: DC

## 2023-09-20 NOTE — Progress Notes (Signed)
 [facility] at [facility] [address]  [place], [place] [address] [phone]   Interval Evaluation  Date of Service: 09/20/23 Patient Name: Randall Lewis Patient MRN: [id number] Patient DOB: July 25, 1955 Provider: Mamie Searles, MD  Identifying Statement:  Randall Lewis is a 68 y.o. male with right frontal glioblastoma    Oncologic History: Anaplastic oligodendroglioma of frontal lobe (CMS/HHS-HCC)  04/16/2005 Surgery  Craniotomy for resection of left frontal lesion. 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 Lost to follow up after 03/18/2010  05/02/2023 Presentation  Presentation to the [facility] at [facility]. Recommendations pending final pathology. Likely XRT/Temozolomide  as this is a high grade glioma. 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 Pathology reveals Infiltrating High Grade Glioma pending final molecular studies for integrated diagnosis.  Molecular data (see molecular reports for more details): IDH mutation is NOT detected (PGDx elio Solid Tumor NGS Panel) H3 mutation is NOT detected (PGDx elio Solid Tumor NGS Panel) 1p/19q codeletion is NOT detected (chromosomal microarray) Chromothripsis of chromosomes 7 and 13 is detected (chromosomal microarray) Chromosome 10 loss with PTEN loss is detected (chromosomal microarray) Chromosome 9  segmental loss with homozygous loss of CDKN2A/B is detected (chromosomal microarray) TERT promoter mutation is detected ([facility]Dx elio Solid Tumor NGS Panel) EGFRvIII is detected ([facility]Dx elio Solid Tumor NGS Panel) EGFR amplification is detected (chromosomal microarray and PGDx elio Solid Tumor NGS Panel) MDM2 amplification is detected (chromosomal microarray and PGDx elio Solid Tumor NGS Panel)    Interval History: Randall Lewis presents today for follow up, now having completed cycle #1 of 5-day Temodar .  Overall he did well with Temodar .  No new or progressive changes reported today.  No headaches or seizures.    H+P (05/19/23) Patient presents for follow up after craniotomy, resection of recurrent primary brain tumor at [facility] on 04/18/23.  He tolerated surgery well, no ill effects.  He is functionally intact, no further headaches, no seizures.  Recently increased blood pressure medication with his PCP.  Has visit with radiation oncology upcoming as well.  Medications: Current Outpatient Medications on File Prior to Visit  Medication Sig Dispense Refill   acetaminophen  (TYLENOL ) 325 MG tablet Take 975 mg by mouth every 6 (six) hours as needed.     amLODipine (NORVASC) 5 MG tablet Take 5 mg by mouth daily.     buPROPion (WELLBUTRIN SR) 100 MG 12 hr tablet Take 100 mg by mouth every morning.     JARDIANCE 25 MG TABS tablet Take 25 mg by mouth daily.     losartan-hydrochlorothiazide  (HYZAAR) 100-12.5 MG tablet Take 1 tablet by mouth daily.     metFORMIN (GLUCOPHAGE-XR) 500 MG 24 hr tablet Take 1,000 mg by mouth daily.     metoprolol tartrate (LOPRESSOR) 12.5 mg TABS tablet Take 12.5 mg by mouth daily.     ondansetron  (ZOFRAN ) 8 MG tablet Take 1 tablet (8 mg total) by mouth every 8 (eight) hours as needed for nausea or vomiting. May take 30-60  minutes prior to Temodar  administration if nausea/vomiting occurs as needed. 30 tablet 1   oxyCODONE  (OXY IR/ROXICODONE ) 5 MG immediate release tablet Take 5  mg by mouth every 4 (four) hours as needed.     pioglitazone (ACTOS) 15 MG tablet Take 15 mg by mouth daily.     simvastatin (ZOCOR) 20 MG tablet Take 20 mg by mouth daily.     tamsulosin  (FLOMAX ) 0.4 MG CAPS capsule Take 0.4 mg by mouth daily.     temozolomide  (TEMODAR ) 100 MG capsule Take 2 capsules (200 mg total) by mouth daily. (Take with ONE 140 mg capsule for total daily dose of 340 mg). Take for 5 days on, 23 days off. Repeat every 28 days. May take on an empty stomach to decrease nausea & vomiting. 10 capsule 0   temozolomide  (TEMODAR ) 140 MG capsule Take 1 capsule (140 mg total) by mouth daily. (Take with TWO 100 mg capsules for total daily dose of 340 mg). Take for 5 days on, 23 days off. Repeat every 28 days. May take on an empty stomach to decrease nausea & vomiting. 5 capsule 0   No current facility-administered medications on file prior to visit.    Allergies:  Allergies  Allergen Reactions   Sildenafil Other (See Comments) and Nausea Only   Gadolinium Derivatives Nausea And Vomiting    Per pt , always get sick with gado and never offered anti nausea meds.   Past Medical History:  Past Medical History:  Diagnosis Date   BPH (benign prostatic hyperplasia)    Cancer (HCC) 2006   Brain tumor, in remission after chemo radiation and surgery   Diabetes mellitus without complication (HCC)    ED (erectile dysfunction)    Heart murmur    History of heart murmur   Hyperlipidemia    Hypertension    Sleep apnea    USES C-PAP   Past Surgical History:  Past Surgical History:  Procedure Laterality Date   BRAIN SURGERY  2006   BRAIN TUMOR  - CANCER   COLONOSCOPY     CYSTOSCOPY N/A 07/29/2015   Procedure: FLEXIBLE CYSTOSCOPY;  Surgeon: Homero Luster, MD;  Location: [facility];  Service: Urology;  Laterality: N/A;   FOOT BONE EXCISION  1989   PENILE PROSTHESIS IMPLANT N/A 07/29/2015   Procedure: PENILE PROSTHESIS;  Surgeon: Homero Luster, MD;  Location: [facility];  Service: Urology;   Laterality: N/A;   right knee meniscus repair  2010   THYROID  LOBECTOMY  05/12/2012   Procedure: THYROID  LOBECTOMY;  Surgeon: Keitha Pata, MD;  Location: [facility];  Service: General;  Laterality: Left;  left thyroid  lobectomy   Social History:  Social History   Socioeconomic History   Marital status: Married    Spouse name: [name]   Number of children: 2   Years of education: Not on file   Highest education level: Master's degree (e.g., MA, MS, MEng, MEd, MSW, MBA)  Occupational History   Not on file  Tobacco Use   Smoking status: Former    Current packs/day: 0.00    Average packs/day: 1 pack/day for 23.0 years (23.0 ttl pk-yrs)    Types: Cigarettes    Start date: 04/26/1973    Quit date: 04/26/1996    Years since quitting: 27.4   Smokeless tobacco: Never  Substance and Sexual Activity   Alcohol use: No   Drug use: No   Sexual activity: Not on file  Other Topics Concern   Not on file  Social  History Narrative   Lives with wife   Right handed   Caffeine: 2 cups of coffee and 1 glass of tea/soda a day   Social Drivers of Corporate investment banker Strain: Low Risk  (05/01/2023)   Received from [facility]   Overall Financial Resource Strain (CARDIA)    Difficulty of Paying Living Expenses: Not hard at all  Food Insecurity: No Food Insecurity (05/20/2023)   Hunger Vital Sign    Worried About Running Out of Food in the Last Year: Never true    Ran Out of Food in the Last Year: Never true  Transportation Needs: No Transportation Needs (05/20/2023)   PRAPARE - Administrator, Civil Service (Medical): No    Lack of Transportation (Non-Medical): No  Physical Activity: Not on file  Stress: Not on file  Social Connections: Not on file  Intimate Partner Violence: Not At Risk (05/20/2023)   Humiliation, Afraid, Rape, and Kick questionnaire    Fear of Current or Ex-Partner: No    Emotionally Abused: No    Physically Abused: No    Sexually  Abused: No   Family History:  Family History  Problem Relation Age of Onset   Breast cancer Mother    Cancer Brother 48   Heart attack Brother    Heart attack Brother    Birth defects Son    Colon cancer Neg Hx    Colon polyps Neg Hx    Esophageal cancer Neg Hx    Stomach cancer Neg Hx    Rectal cancer Neg Hx     Review of Systems: Constitutional: Doesn't report fevers, chills or abnormal weight loss Eyes: Doesn't report blurriness of vision Ears, nose, mouth, throat, and face: Doesn't report sore throat Respiratory: Doesn't report cough, dyspnea or wheezes Cardiovascular: Doesn't report palpitation, chest discomfort  Gastrointestinal:  Doesn't report nausea, constipation, diarrhea GU: Doesn't report incontinence Skin: Doesn't report skin rashes Neurological: Per HPI Musculoskeletal: Doesn't report joint pain Behavioral/Psych: Doesn't report anxiety  Physical Exam: Vitals:   09/20/23 1009  BP: 133/80  Pulse: (!) 54  Resp: 15  Temp: 97.9 F (36.6 C)  SpO2: 92%     KPS: 90. General: Alert, cooperative, pleasant, in no acute distress Head: Normal EENT: No conjunctival injection or scleral icterus.  Lungs: Resp effort normal Cardiac: Regular rate Abdomen: Non-distended abdomen Skin: No rashes cyanosis or petechiae. Extremities: No clubbing or edema  Neurologic Exam: Mental Status: Awake, alert, attentive to examiner. Oriented to self and environment. Language is fluent with intact comprehension.  Cranial Nerves: Visual acuity is grossly normal. Visual fields are full. Extra-ocular movements intact. No ptosis. Face is symmetric Motor: Tone and bulk are normal. Power is full in both arms and legs. Reflexes are symmetric, no pathologic reflexes present.  Sensory: Intact to light touch Gait: Normal.   Labs: I have reviewed the data as listed    Component Value Date/Time   NA 139 08/09/2023 1108   K 4.0 08/09/2023 1108   CL 104 08/09/2023 1108   CO2 29  08/09/2023 1108   GLUCOSE 156 (H) 08/09/2023 1108   BUN 22 08/09/2023 1108   CREATININE 1.17 08/09/2023 1108   CREATININE 1.31 (H) 02/25/2012 1438   CALCIUM 9.2 08/09/2023 1108   PROT 6.9 08/09/2023 1108   ALBUMIN 4.4 08/09/2023 1108   AST 13 (L) 08/09/2023 1108   ALT 20 08/09/2023 1108   ALKPHOS 48 08/09/2023 1108   BILITOT 0.6 08/09/2023 1108  GFRNONAA >60 08/09/2023 1108   GFRNONAA >60 02/25/2012 1438   GFRAA >60 08/14/2019 1438   GFRAA >60 02/25/2012 1438   Lab Results  Component Value Date   WBC 4.4 09/20/2023   NEUTROABS 3.1 09/20/2023   HGB 15.9 09/20/2023   HCT 48.0 09/20/2023   MCV 76.4 (L) 09/20/2023   PLT 188 09/20/2023     Assessment/Plan Glioblastoma, IDH-wildtype (HCC)  Jereld Monas is clinically stable today, now having completed cycle #1 of 5-day Temodar .  Labs are within normal limits today.  We recommended initiating treatment with cycle #2 Temozolomide  dose increased to 200mg /m2, on for five days and off for twenty three days in twenty eight day cycles. The patient will have a complete blood count performed on days 21 and 28 of each cycle, and a comprehensive metabolic panel performed on day 28 of each cycle. Labs may need to be performed more often. Zofran  will prescribed for home use for nausea/vomiting.   Informed consent was obtained verbally at bedside to proceed with oral chemotherapy.  Chemotherapy should be held for the following:  ANC less than 1,000  Platelets less than 100,000  LFT or creatinine greater than 2x ULN  If clinical concerns/contraindications develop  We ask that Jereld Monas return to clinic in 1 months with MRI brain prior to cycle #3, or sooner as needed.  All questions were answered. The patient knows to call the clinic with any problems, questions or concerns. No barriers to learning were detected.  The total time spent in the encounter was 30 minutes and more than 50% was on counseling and review of test  results   Mamie Searles, MD Medical Director of Neuro-Oncology [facility] at [place] 09/20/23 9:58 AM

## 2023-09-20 NOTE — Progress Notes (Signed)
 Specialty Pharmacy Ongoing Clinical Assessment Note  Randall Lewis is a 68 y.o. male who is being followed by the specialty pharmacy service for RxSp Oncology   Patient's specialty medication(s) reviewed today: Temozolomide  (TEMODAR )   Missed doses in the last 4 weeks: 0   Patient/Caregiver did not have any additional questions or concerns.   Therapeutic benefit summary: Unable to assess   Adverse events/side effects summary: No adverse events/side effects   Patient's therapy is appropriate to: Continue    Goals Addressed             This Visit's Progress    Slow Disease Progression       Patient is unable to be assessed as therapy was recently initiated. Patient will maintain adherence          Follow up:  3 months  Randall Lewis Specialty Pharmacist

## 2023-09-20 NOTE — Progress Notes (Signed)
 Specialty Pharmacy Refill Coordination Note  Randall Lewis is a 68 y.o. male contacted today regarding refills of specialty medication(s) Temozolomide  (TEMODAR )   Patient requested [name] at [facility] at [place] date: 09/21/23   Medication will be filled on 04.22.25.   [employer] coverage active copay $16 for each temodar  rx.

## 2023-09-20 NOTE — Telephone Encounter (Signed)
 Patient scheduled appointments. Patient is aware of all appointment details.

## 2023-09-20 NOTE — Telephone Encounter (Signed)
 Oral Chemotherapy Pharmacist Encounter   Temozolomide  instructions for 100 mg and 180 mg capsules updated to reflect dose increase to total daily dose of 460 mg. Prescription will continue being filled through [facility].   Jude Norton, PharmD, BCPS, BCOP Hematology/Oncology Clinical Pharmacist [facility]alling and [facility] Oral Chemotherapy [facility] [phone] 09/20/2023 10:30 AM

## 2023-09-25 ENCOUNTER — Other Ambulatory Visit: Payer: Self-pay

## 2023-09-25 ENCOUNTER — Encounter (HOSPITAL_COMMUNITY): Payer: Self-pay

## 2023-09-25 ENCOUNTER — Emergency Department (HOSPITAL_COMMUNITY)

## 2023-09-25 ENCOUNTER — Emergency Department (HOSPITAL_COMMUNITY)
Admission: EM | Admit: 2023-09-25 | Discharge: 2023-09-25 | Disposition: A | Discharge status: Home / Self Care | Attending: Emergency Medicine | Admitting: Emergency Medicine

## 2023-09-25 DIAGNOSIS — K5900 Constipation, unspecified: Secondary | ICD-10-CM | POA: Diagnosis not present

## 2023-09-25 MED ORDER — MINERAL OIL RE ENEM
1.0000 | ENEMA | Freq: Once | RECTAL | Status: AC
  Administered 2023-09-25: 1 via RECTAL
  Filled 2023-09-25: qty 1

## 2023-09-25 MED ORDER — PEG 3350-KCL-NA BICARB-NACL 420 G PO SOLR
4000.0000 mL | Freq: Once | ORAL | Status: DC
  Filled 2023-09-25: qty 4000

## 2023-09-25 NOTE — ED Triage Notes (Signed)
 Pt reports with constipation x 3 days. Pt has taken fleet enemas and other medications with no relief.

## 2023-09-25 NOTE — ED Notes (Signed)
 Soap Suds enema performed. Pt reports little to no relief after procedure. Pt did not have a BM after manual disimpaction ws performed; but pt report manual disimpaction gave him the most relief.

## 2023-09-25 NOTE — ED Provider Notes (Signed)
 [place] EMERGENCY DEPARTMENT AT [facility] Provider Note   CSN: [id number] Arrival date & time: 09/25/23  1428     History  Chief Complaint  Patient presents with   Constipation    Randall Lewis is a 68 y.o. male.  68 year old presents with constipation x 3 days.  Patient states he has used laxatives as well as fleets enemas without resolve.  Did have emesis x 1 while he was straining down.  Denies any fever or chills.  States that stool comes out has been hard.  Feels that this is a side effect of his chemotherapy medications.       Home Medications Prior to Admission medications   Medication Sig Start Date End Date Taking? Authorizing Provider  acetaminophen  (TYLENOL ) 325 MG tablet Take 975 mg by mouth every 6 (six) hours as needed. 04/19/23   [provider]  amLODipine (NORVASC) 5 MG tablet Take 5 mg by mouth daily.    [provider]  buPROPion (WELLBUTRIN SR) 100 MG 12 hr tablet Take 100 mg by mouth every morning. 11/29/19   [provider]  JARDIANCE 25 MG TABS tablet Take 25 mg by mouth daily. 05/23/23   [provider]  losartan-hydrochlorothiazide  (HYZAAR) 100-12.5 MG tablet Take 1 tablet by mouth daily.    [provider]  metFORMIN (GLUCOPHAGE-XR) 500 MG 24 hr tablet Take 1,000 mg by mouth daily. 01/17/23   [provider]  metoprolol tartrate (LOPRESSOR) 12.5 mg TABS tablet Take 12.5 mg by mouth daily.    [provider]  ondansetron  (ZOFRAN ) 8 MG tablet Take 1 tablet (8 mg total) by mouth every 8 (eight) hours as needed for nausea or vomiting. May take 30-60 minutes prior to Temodar  administration if nausea/vomiting occurs as needed. 08/09/23   Vaslow, Zachary K, MD  oxyCODONE  (OXY IR/ROXICODONE ) 5 MG immediate release tablet Take 5 mg by mouth every 4 (four) hours as needed. 04/19/23   [provider]  pioglitazone (ACTOS) 15 MG tablet Take 15 mg by mouth daily. 11/26/19   [provider]  predniSONE  (DELTASONE ) 50 MG tablet Take 1 tablet at 13 hours, 7 hours, and 1 hour prior to IV contrast 09/20/23   Vaslow, Zachary K, MD  simvastatin (ZOCOR) 20 MG tablet Take 20 mg by mouth daily. 05/16/15   [provider]  tamsulosin  (FLOMAX ) 0.4 MG CAPS capsule Take 0.4 mg by mouth daily. 05/04/23   [provider]  temozolomide  (TEMODAR ) 100 MG capsule Take 1 capsule (100 mg total) by mouth daily. (Take with TWO 180 mg capsules for total daily dose of 460 mg). Take for 5 days on, 23 days off. Repeat every 28 days. May take on an empty stomach to decrease nausea & vomiting. 09/20/23   Vaslow, Zachary K, MD  temozolomide  (TEMODAR ) 180 MG capsule Take 2 capsules (360 mg total) by mouth daily. (Take with ONE 100 mg capsules for total daily dose of 460 mg). Take for 5 days on, 23 days off. Repeat every 28 days. May take on an empty stomach to decrease nausea & vomiting. 09/20/23   Vaslow, Zachary K, MD      Allergies    Sildenafil and Gadolinium derivatives    Review of Systems   Review of Systems  All other systems reviewed and are negative.   Physical Exam Updated Vital Signs BP (!) 160/105 (BP Location: Right Arm)   Pulse 81   Temp 97.9 F (36.6 C) (Oral)   Resp  16   SpO2 98%  Physical Exam Vitals and nursing note reviewed. Exam conducted with a chaperone present.  Constitutional:      General: He is not in acute distress.    Appearance: Normal appearance. He is well-developed. He is not toxic-appearing.  HENT:     Head: Normocephalic and atraumatic.  Eyes:     General: Lids are normal.     Conjunctiva/sclera: Conjunctivae normal.     Pupils: Pupils are equal, round, and reactive to light.  Neck:     Thyroid : No thyroid  mass.     Trachea: No tracheal deviation.  Cardiovascular:     Rate and Rhythm: Normal rate and regular rhythm.     Heart sounds: Normal heart sounds. No murmur heard.    No gallop.  Pulmonary:     Effort: Pulmonary effort is  normal. No respiratory distress.     Breath sounds: Normal breath sounds. No stridor. No decreased breath sounds, wheezing, rhonchi or rales.  Abdominal:     General: There is no distension.     Palpations: Abdomen is soft.     Tenderness: There is no abdominal tenderness. There is no rebound.  Genitourinary:    Comments: Stool noted in vault Musculoskeletal:        General: No tenderness. Normal range of motion.     Cervical back: Normal range of motion and neck supple.  Skin:    General: Skin is warm and dry.     Findings: No abrasion or rash.  Neurological:     Mental Status: He is alert and oriented to person, place, and time. Mental status is at baseline.     GCS: GCS eye subscore is 4. GCS verbal subscore is 5. GCS motor subscore is 6.     Cranial Nerves: No cranial nerve deficit.     Sensory: No sensory deficit.     Motor: Motor function is intact.  Psychiatric:        Attention and Perception: Attention normal.        Speech: Speech normal.        Behavior: Behavior normal.     ED Results / Procedures / Treatments   Labs (all labs ordered are listed, but only abnormal results are displayed) Labs Reviewed - No data to display  EKG None  Radiology No results found.  Procedures Procedures    Medications Ordered in ED Medications - No data to display  ED Course/ Medical Decision Making/ A&P                                 Medical Decision Making Amount and/or Complexity of Data Reviewed Radiology: ordered.  Risk OTC drugs. Prescription drug management.   Patient had 2 digital rectal exams to disimpact his stool with mild relief.  Attempted enema x 2 without success.  Patient does not have any signs of emesis at this time.  Acute abdominal series did not show any signs obstruction.  I have ordered GoLytely and patient states he would rather take this at home.  Will return if he starts to vomit.        Final Clinical Impression(s) / ED  Diagnoses Final diagnoses:  None    Rx / DC Orders ED Discharge Orders     None         Lind Repine, MD 09/25/23 2127

## 2023-09-25 NOTE — ED Notes (Signed)
 Pt tolerated Mineral Oil Enema well. Pt held liquid in rectal for 5 minutes then pt bear down attempting to defecate. No stool came out of rectum. Pt reports no change in previous status.

## 2023-09-25 NOTE — ED Notes (Addendum)
 Bladder Scan resulted 486 mL volume; provider notified.

## 2023-10-07 ENCOUNTER — Ambulatory Visit (HOSPITAL_COMMUNITY)
Admission: RE | Admit: 2023-10-07 | Discharge: 2023-10-07 | Disposition: A | Discharge status: Home / Self Care | Source: Ambulatory Visit | Attending: Internal Medicine

## 2023-10-07 DIAGNOSIS — C719 Malignant neoplasm of brain, unspecified: Secondary | ICD-10-CM | POA: Insufficient documentation

## 2023-10-07 MED ORDER — GADOBUTROL 1 MMOL/ML IV SOLN
10.0000 mL | Freq: Once | INTRAVENOUS | Status: AC | PRN
  Administered 2023-10-07: 10 mL via INTRAVENOUS

## 2023-10-10 ENCOUNTER — Other Ambulatory Visit: Payer: Self-pay | Admitting: *Deleted

## 2023-10-12 ENCOUNTER — Other Ambulatory Visit: Payer: Self-pay

## 2023-10-17 ENCOUNTER — Encounter: Payer: Self-pay | Admitting: Internal Medicine

## 2023-10-18 ENCOUNTER — Other Ambulatory Visit: Payer: Self-pay

## 2023-10-18 ENCOUNTER — Encounter: Payer: Self-pay | Admitting: Gastroenterology

## 2023-10-18 ENCOUNTER — Ambulatory Visit (INDEPENDENT_AMBULATORY_CARE_PROVIDER_SITE_OTHER): Admitting: Gastroenterology

## 2023-10-18 ENCOUNTER — Inpatient Hospital Stay: Attending: Internal Medicine

## 2023-10-18 ENCOUNTER — Other Ambulatory Visit (HOSPITAL_COMMUNITY): Payer: Self-pay

## 2023-10-18 ENCOUNTER — Inpatient Hospital Stay (HOSPITAL_BASED_OUTPATIENT_CLINIC_OR_DEPARTMENT_OTHER): Admitting: Internal Medicine

## 2023-10-18 ENCOUNTER — Encounter: Payer: Self-pay | Admitting: Internal Medicine

## 2023-10-18 VITALS — BP 118/62 | HR 55 | Ht 71.0 in | Wt 233.0 lb

## 2023-10-18 VITALS — BP 124/78 | HR 52 | Temp 98.3°F | Resp 15 | Wt 232.9 lb

## 2023-10-18 DIAGNOSIS — R109 Unspecified abdominal pain: Secondary | ICD-10-CM | POA: Diagnosis not present

## 2023-10-18 DIAGNOSIS — Z860100 Personal history of colon polyps, unspecified: Secondary | ICD-10-CM | POA: Insufficient documentation

## 2023-10-18 DIAGNOSIS — C719 Malignant neoplasm of brain, unspecified: Secondary | ICD-10-CM | POA: Diagnosis not present

## 2023-10-18 DIAGNOSIS — Z860101 Personal history of adenomatous and serrated colon polyps: Secondary | ICD-10-CM

## 2023-10-18 DIAGNOSIS — C711 Malignant neoplasm of frontal lobe: Secondary | ICD-10-CM | POA: Diagnosis not present

## 2023-10-18 DIAGNOSIS — Z87891 Personal history of nicotine dependence: Secondary | ICD-10-CM | POA: Insufficient documentation

## 2023-10-18 DIAGNOSIS — Z803 Family history of malignant neoplasm of breast: Secondary | ICD-10-CM | POA: Insufficient documentation

## 2023-10-18 LAB — CMP (CANCER CENTER ONLY)
ALT: 17 U/L (ref 0–44)
AST: 13 U/L — ABNORMAL LOW (ref 15–41)
Albumin: 4.3 g/dL (ref 3.5–5.0)
Alkaline Phosphatase: 48 U/L (ref 38–126)
Anion gap: 5 (ref 5–15)
BUN: 24 mg/dL — ABNORMAL HIGH (ref 8–23)
CO2: 32 mmol/L (ref 22–32)
Calcium: 9.5 mg/dL (ref 8.9–10.3)
Chloride: 102 mmol/L (ref 98–111)
Creatinine: 1.34 mg/dL — ABNORMAL HIGH (ref 0.61–1.24)
GFR, Estimated: 58 mL/min — ABNORMAL LOW (ref >60)
Glucose, Bld: 101 mg/dL — ABNORMAL HIGH (ref 70–99)
Potassium: 3.9 mmol/L (ref 3.5–5.1)
Sodium: 139 mmol/L (ref 135–145)
Total Bilirubin: 0.7 mg/dL (ref 0.0–1.2)
Total Protein: 6.9 g/dL (ref 6.5–8.1)

## 2023-10-18 LAB — CBC WITH DIFFERENTIAL (CANCER CENTER ONLY)
Abs Immature Granulocytes: 0.01 10*3/uL (ref 0.00–0.07)
Basophils Absolute: 0 10*3/uL (ref 0.0–0.1)
Basophils Relative: 0 %
Eosinophils Absolute: 0 10*3/uL (ref 0.0–0.5)
Eosinophils Relative: 1 %
HCT: 49.1 % (ref 39.0–52.0)
Hemoglobin: 16.3 g/dL (ref 13.0–17.0)
Immature Granulocytes: 0 %
Lymphocytes Relative: 24 %
Lymphs Abs: 1.3 10*3/uL (ref 0.7–4.0)
MCH: 25.5 pg — ABNORMAL LOW (ref 26.0–34.0)
MCHC: 33.2 g/dL (ref 30.0–36.0)
MCV: 77 fL — ABNORMAL LOW (ref 80.0–100.0)
Monocytes Absolute: 0.5 10*3/uL (ref 0.1–1.0)
Monocytes Relative: 9 %
Neutro Abs: 3.4 10*3/uL (ref 1.7–7.7)
Neutrophils Relative %: 66 %
Platelet Count: 165 10*3/uL (ref 150–400)
RBC: 6.38 MIL/uL — ABNORMAL HIGH (ref 4.22–5.81)
RDW: 18.6 % — ABNORMAL HIGH (ref 11.5–15.5)
WBC Count: 5.2 10*3/uL (ref 4.0–10.5)
nRBC: 0 % (ref 0.0–0.2)

## 2023-10-18 MED ORDER — TEMOZOLOMIDE 180 MG PO CAPS
360.0000 mg | ORAL_CAPSULE | Freq: Every day | ORAL | 0 refills | Status: DC
  Filled 2023-10-18: qty 10, 28d supply, fill #0

## 2023-10-18 MED ORDER — TEMOZOLOMIDE 100 MG PO CAPS
100.0000 mg | ORAL_CAPSULE | Freq: Every day | ORAL | 0 refills | Status: DC
  Filled 2023-10-18: qty 5, 28d supply, fill #0

## 2023-10-18 NOTE — Patient Instructions (Signed)
 _______________________________________________________  If your blood pressure at your visit was 140/90 or greater, please contact your primary care physician to follow up on this.  _______________________________________________________  If you are age 67 or older, your body mass index should be between 23-30. Your Body mass index is 32.5 kg/m. If this is out of the aforementioned range listed, please consider follow up with your Primary Care Provider.  If you are age 36 or younger, your body mass index should be between 19-25. Your Body mass index is 32.5 kg/m. If this is out of the aformentioned range listed, please consider follow up with your Primary Care Provider.   ________________________________________________________  The [facility] providers would like to encourage you to use MYCHART to communicate with providers for non-urgent requests or questions.  Due to long hold times on the telephone, sending your provider a message by [facility] may be a faster and more efficient way to get a response.  Please allow 48 business hours for a response.  Please remember that this is for non-urgent requests.  _______________________________________________________  I appreciate the opportunity to care for you. Jessica Zehr, PA

## 2023-10-18 NOTE — Progress Notes (Signed)
 Specialty Pharmacy Refill Coordination Note  Randall Lewis is a 68 y.o. male contacted today regarding refills of specialty medication(s) Temozolomide  (TEMODAR )   Patient requested [name] at [facility] at [place] date: 10/19/23   Medication will be filled on 05.20.25.

## 2023-10-18 NOTE — Progress Notes (Signed)
 10/18/2023 Randall Lewis [id number] 12-24-1955   HISTORY OF PRESENT ILLNESS: This is a 68 year old male is a patient of Dr. Revonda Castles.  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  Also has hypertension, hyperlipidemia, sleep apnea for which he uses CPAP.  He is here today as he was told by his PCP to consider colonoscopy for evaluation abdominal pain.  He says he was having some right sided abdominal pain, but that has resolved and he has not had any of that pain for the past 3 to 4 months.  He is having some constipation from the chemotherapy that he is receiving and was using MiraLAX, but that did not seem to work so is now going to start Senokot.  No rectal bleeding.  Colonoscopy 12/2019:  - Diverticulosis in the left colon and in the right colon. - Two diminutive polyps in the distal rectum and in the proximal ascending colon, removed with a cold snare. Resected and retrieved. - Internal hemorrhoids. - The examination was otherwise normal on direct and retroflexion views.  Surgical [P], colon, ascending, rectal, polyp (2) - TUBULAR ADENOMA (2 OF 2 FRAGMENTS) - NO HIGH GRADE DYSPLASIA OR MALIGNANCY IDENTIFIED   Past Medical History:  Diagnosis Date   Anxiety    BPH (benign prostatic hyperplasia)    Cancer (HCC) 05/31/2004   Brain tumor, in remission after chemo radiation and surgery   Diabetes mellitus without complication (HCC)    ED (erectile dysfunction)    Heart murmur    History of heart murmur   Hyperlipidemia    Hypertension    Sleep apnea    USES C-PAP   UTI (urinary tract infection)    Past Surgical History:  Procedure Laterality Date   BRAIN SURGERY  2006   BRAIN TUMOR  - CANCER   COLONOSCOPY     CYSTOSCOPY N/A 07/29/2015   Procedure: FLEXIBLE CYSTOSCOPY;  Surgeon: Homero Luster, MD;  Location: [facility];  Service: Urology;  Laterality: N/A;   FOOT BONE EXCISION  1989   PENILE PROSTHESIS IMPLANT N/A 07/29/2015    Procedure: PENILE PROSTHESIS;  Surgeon: Homero Luster, MD;  Location: [facility];  Service: Urology;  Laterality: N/A;   right knee meniscus repair  2010   THYROID  LOBECTOMY  05/12/2012   Procedure: THYROID  LOBECTOMY;  Surgeon: Keitha Pata, MD;  Location: [facility];  Service: General;  Laterality: Left;  left thyroid  lobectomy    reports that he quit smoking about 27 years ago. His smoking use included cigarettes. He started smoking about 50 years ago. He has a 23 pack-year smoking history. He has never used smokeless tobacco. He reports that he does not drink alcohol and does not use drugs. family history includes Birth defects in his son; Breast cancer in his mother; Cancer (age of onset: 69) in his brother; Heart attack in his brother and brother. Allergies  Allergen Reactions   Sildenafil Other (See Comments) and Nausea Only   Gadolinium Derivatives Nausea And Vomiting    Per pt , always get sick with gado and never offered anti nausea meds.      Outpatient Encounter Medications as of 10/18/2023  Medication Sig   acetaminophen  (TYLENOL ) 325 MG tablet Take 975 mg by mouth every 6 (six) hours as needed.   amLODipine (NORVASC) 5 MG tablet Take 5 mg by mouth daily.   buPROPion (WELLBUTRIN SR) 100 MG 12 hr tablet Take 100 mg by mouth every morning.  JARDIANCE 25 MG TABS tablet Take 25 mg by mouth daily.   losartan-hydrochlorothiazide  (HYZAAR) 100-12.5 MG tablet Take 1 tablet by mouth daily.   metFORMIN (GLUCOPHAGE-XR) 500 MG 24 hr tablet Take 1,000 mg by mouth daily.   metoprolol tartrate (LOPRESSOR) 12.5 mg TABS tablet Take 12.5 mg by mouth daily.   ondansetron  (ZOFRAN ) 8 MG tablet Take 1 tablet (8 mg total) by mouth every 8 (eight) hours as needed for nausea or vomiting. May take 30-60 minutes prior to Temodar  administration if nausea/vomiting occurs as needed.   oxyCODONE  (OXY IR/ROXICODONE ) 5 MG immediate release tablet Take 5 mg by mouth every 4 (four) hours as needed.   pioglitazone (ACTOS) 15  MG tablet Take 15 mg by mouth daily.   predniSONE  (DELTASONE ) 50 MG tablet Take 1 tablet at 13 hours, 7 hours, and 1 hour prior to IV contrast   simvastatin (ZOCOR) 20 MG tablet Take 20 mg by mouth daily.   tamsulosin  (FLOMAX ) 0.4 MG CAPS capsule Take 0.4 mg by mouth daily.   temozolomide  (TEMODAR ) 100 MG capsule Take 1 capsule (100 mg total) by mouth daily. (Take with TWO 180 mg capsules for total daily dose of 460 mg). Take for 5 days on, 23 days off. Repeat every 28 days. May take on an empty stomach to decrease nausea & vomiting.   temozolomide  (TEMODAR ) 180 MG capsule Take 2 capsules (360 mg total) by mouth daily. (Take with ONE 100 mg capsules for total daily dose of 460 mg). Take for 5 days on, 23 days off. Repeat every 28 days. May take on an empty stomach to decrease nausea & vomiting.   No facility-administered encounter medications on file as of 10/18/2023.    REVIEW OF SYSTEMS  : All other systems reviewed and negative except where noted in the History of Present Illness.   PHYSICAL EXAM: BP 118/62   Pulse (!) 55   Ht 5\' 11"  (1.803 m)   Wt 233 lb (105.7 kg)   BMI 32.50 kg/m  General: Well developed male in no acute distress Head: Normocephalic and atraumatic Eyes:  Sclerae anicteric, conjunctiva pink. Ears: Normal auditory acuity Lungs: Clear throughout to auscultation; no W/R/R. Heart: Regular rate and rhythm; no M/R/G. Musculoskeletal: Symmetrical with no gross deformities  Skin: No lesions on visible extremities Extremities: No edema  Neurological: Alert oriented x 4, grossly non-focal Psychological:  Alert and cooperative. Normal mood and affect  ASSESSMENT AND PLAN: *Abdominal pain: Was having some right-sided abdominal pain, but has not had this pain in the past 3 to 4 months.  If abdominal pain is recurrent would consider imaging with ultrasound or CT scan. *Personal history of colon polyps: Had 2 diminutive colon polyps removed in August 2021.  Recall is in place  for August 2028.   CC:  Bertha Broad, MD

## 2023-10-18 NOTE — Progress Notes (Signed)
 [facility] [address]  [place], [place] [address] [phone]   Interval Evaluation  Date of Service: 10/18/23 Patient Name: Randall Lewis Patient MRN: [id number] Patient DOB: 22-Apr-1956 Provider: Mamie Searles, MD  Identifying Statement:  Randall Lewis is a 68 y.o. male with right frontal glioblastoma    Oncologic History: Anaplastic oligodendroglioma of frontal lobe (CMS/HHS-HCC)  04/16/2005 Surgery  Craniotomy for resection of left frontal lesion. 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 Lost to follow up after 03/18/2010  05/02/2023 Presentation  Presentation to the [facility]. Recommendations pending final pathology. Likely XRT/Temozolomide  as this is a high grade glioma. 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 Pathology reveals Infiltrating High Grade Glioma pending final molecular studies for integrated diagnosis.  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  Overall he did well with the increased dose Temodar  this month.  No new or progressive changes reported today.  No headaches or seizures.    H+P (05/19/23) Patient presents for follow up after craniotomy, resection of recurrent primary brain tumor at [facility] on 04/18/23.  He tolerated surgery well, no ill effects.  He is functionally intact, no further headaches, no seizures.  Recently increased blood pressure medication with his PCP.  Has visit with radiation oncology upcoming as well.  Medications: Current Outpatient Medications on File Prior to Visit  Medication Sig Dispense Refill   acetaminophen  (TYLENOL ) 325 MG tablet Take 975 mg by mouth every 6 (six) hours as needed.     amLODipine (NORVASC) 5 MG tablet Take 5 mg by mouth daily.     buPROPion (WELLBUTRIN SR) 100 MG 12 hr tablet Take 100 mg by mouth every morning.     JARDIANCE 25 MG TABS tablet Take 25 mg by mouth daily.     losartan-hydrochlorothiazide  (HYZAAR) 100-12.5 MG tablet Take 1 tablet by mouth daily.     metFORMIN (GLUCOPHAGE-XR) 500 MG 24 hr tablet Take 1,000 mg by mouth daily.     metoprolol tartrate (LOPRESSOR) 12.5 mg TABS tablet Take 12.5 mg by mouth daily.  ondansetron  (ZOFRAN ) 8 MG tablet Take 1 tablet (8 mg total) by mouth every 8 (eight) hours as needed for  nausea or vomiting. May take 30-60 minutes prior to Temodar  administration if nausea/vomiting occurs as needed. 30 tablet 1   oxyCODONE  (OXY IR/ROXICODONE ) 5 MG immediate release tablet Take 5 mg by mouth every 4 (four) hours as needed.     pioglitazone (ACTOS) 15 MG tablet Take 15 mg by mouth daily.     predniSONE  (DELTASONE ) 50 MG tablet Take 1 tablet at 13 hours, 7 hours, and 1 hour prior to IV contrast 9 tablet 0   simvastatin (ZOCOR) 20 MG tablet Take 20 mg by mouth daily.     tamsulosin  (FLOMAX ) 0.4 MG CAPS capsule Take 0.4 mg by mouth daily.     No current facility-administered medications on file prior to visit.    Allergies:  Allergies  Allergen Reactions   Sildenafil Other (See Comments) and Nausea Only   Gadolinium Derivatives Nausea And Vomiting    Per pt , always get sick with gado and never offered anti nausea meds.   Past Medical History:  Past Medical History:  Diagnosis Date   Anxiety    BPH (benign prostatic hyperplasia)    Cancer (HCC) 05/31/2004   Brain tumor, in remission after chemo radiation and surgery   Diabetes mellitus without complication (HCC)    ED (erectile dysfunction)    Heart murmur    History of heart murmur   Hyperlipidemia    Hypertension    Sleep apnea    USES C-PAP   UTI (urinary tract infection)    Past Surgical History:  Past Surgical History:  Procedure Laterality Date   BRAIN SURGERY  2006   BRAIN TUMOR  - CANCER   COLONOSCOPY     CYSTOSCOPY N/A 07/29/2015   Procedure: FLEXIBLE CYSTOSCOPY;  Surgeon: Homero Luster, MD;  Location: [facility];  Service: Urology;  Laterality: N/A;   FOOT BONE EXCISION  1989   PENILE PROSTHESIS IMPLANT N/A 07/29/2015   Procedure: PENILE PROSTHESIS;  Surgeon: Homero Luster, MD;  Location: [facility];  Service: Urology;  Laterality: N/A;   right knee meniscus repair  2010   THYROID  LOBECTOMY  05/12/2012   Procedure: THYROID  LOBECTOMY;  Surgeon: Keitha Pata, MD;  Location: [facility];  Service: General;  Laterality:  Left;  left thyroid  lobectomy   Social History:  Social History   Socioeconomic History   Marital status: Married    Spouse name: [name]   Number of children: 2   Years of education: Not on file   Highest education level: Master's degree (e.g., MA, MS, MEng, MEd, MSW, MBA)  Occupational History   Not on file  Tobacco Use   Smoking status: Former    Current packs/day: 0.00    Average packs/day: 1 pack/day for 23.0 years (23.0 ttl pk-yrs)    Types: Cigarettes    Start date: 04/26/1973    Quit date: 04/26/1996    Years since quitting: 27.4   Smokeless tobacco: Never  Vaping Use   Vaping status: Never Used  Substance and Sexual Activity   Alcohol use: No   Drug use: No   Sexual activity: Not on file  Other Topics Concern   Not on file  Social History Narrative   Lives with wife   Right handed   Caffeine: 2 cups of coffee and 1 glass of tea/soda a day   Social Drivers of Corporate investment banker Strain: Low Risk  (  05/01/2023)   Received from [facility]   Overall Financial Resource Strain (CARDIA)    Difficulty of Paying Living Expenses: Not hard at all  Food Insecurity: No Food Insecurity (05/20/2023)   Hunger Vital Sign    Worried About Running Out of Food in the Last Year: Never true    Ran Out of Food in the Last Year: Never true  Transportation Needs: No Transportation Needs (05/20/2023)   PRAPARE - Administrator, Civil Service (Medical): No    Lack of Transportation (Non-Medical): No  Physical Activity: Not on file  Stress: Not on file  Social Connections: Not on file  Intimate Partner Violence: Not At Risk (05/20/2023)   Humiliation, Afraid, Rape, and Kick questionnaire    Fear of Current or Ex-Partner: No    Emotionally Abused: No    Physically Abused: No    Sexually Abused: No   Family History:  Family History  Problem Relation Age of Onset   Breast cancer Mother    Cancer Brother 58   Heart attack Brother    Heart  attack Brother    Birth defects Son    Colon cancer Neg Hx    Liver disease Neg Hx    Esophageal cancer Neg Hx     Review of Systems: Constitutional: Doesn't report fevers, chills or abnormal weight loss Eyes: Doesn't report blurriness of vision Ears, nose, mouth, throat, and face: Doesn't report sore throat Respiratory: Doesn't report cough, dyspnea or wheezes Cardiovascular: Doesn't report palpitation, chest discomfort  Gastrointestinal:  Doesn't report nausea, constipation, diarrhea GU: Doesn't report incontinence Skin: Doesn't report skin rashes Neurological: Per HPI Musculoskeletal: Doesn't report joint pain Behavioral/Psych: Doesn't report anxiety  Physical Exam: Vitals:   10/18/23 1206  BP: 124/78  Pulse: (!) 52  Resp: 15  Temp: 98.3 F (36.8 C)  SpO2: 96%     KPS: 90. General: Alert, cooperative, pleasant, in no acute distress Head: Normal EENT: No conjunctival injection or scleral icterus.  Lungs: Resp effort normal Cardiac: Regular rate Abdomen: Non-distended abdomen Skin: No rashes cyanosis or petechiae. Extremities: No clubbing or edema  Neurologic Exam: Mental Status: Awake, alert, attentive to examiner. Oriented to self and environment. Language is fluent with intact comprehension.  Cranial Nerves: Visual acuity is grossly normal. Visual fields are full. Extra-ocular movements intact. No ptosis. Face is symmetric Motor: Tone and bulk are normal. Power is full in both arms and legs. Reflexes are symmetric, no pathologic reflexes present.  Sensory: Intact to light touch Gait: Normal.   Labs: I have reviewed the data as listed    Component Value Date/Time   NA 139 10/18/2023 1147   K 3.9 10/18/2023 1147   CL 102 10/18/2023 1147   CO2 32 10/18/2023 1147   GLUCOSE 101 (H) 10/18/2023 1147   BUN 24 (H) 10/18/2023 1147   CREATININE 1.34 (H) 10/18/2023 1147   CREATININE 1.31 (H) 02/25/2012 1438   CALCIUM 9.5 10/18/2023 1147   PROT 6.9 10/18/2023  1147   ALBUMIN 4.3 10/18/2023 1147   AST 13 (L) 10/18/2023 1147   ALT 17 10/18/2023 1147   ALKPHOS 48 10/18/2023 1147   BILITOT 0.7 10/18/2023 1147   GFRNONAA 58 (L) 10/18/2023 1147   GFRNONAA >60 02/25/2012 1438   GFRAA >60 08/14/2019 1438   GFRAA >60 02/25/2012 1438   Lab Results  Component Value Date   WBC 5.2 10/18/2023   NEUTROABS 3.4 10/18/2023   HGB 16.3 10/18/2023   HCT 49.1 10/18/2023  MCV 77.0 (L) 10/18/2023   PLT 165 10/18/2023    Imaging:  [facility] Clinician Interpretation: I have personally reviewed the CNS images as listed.  My interpretation, in the context of the patient's clinical presentation, is treatment effect vs true progression  MR BRAIN W WO CONTRAST Result Date: 10/07/2023 CLINICAL DATA:  Assess treatment response, glioblastoma. EXAM: MRI HEAD WITHOUT AND WITH CONTRAST TECHNIQUE: Multiplanar, multiecho pulse sequences of the brain and surrounding structures were obtained without and with intravenous contrast. CONTRAST:  10mL GADAVIST  GADOBUTROL  1 MMOL/ML IV SOLN COMPARISON:  08/04/2023 FINDINGS: Brain: Necrotic type enhancement at the right occipital lobe, medial to the occipital horn, is a few mm larger than before, currently 20 x 20 x 10 mm as compared to 16 x 16 x 8 mm. Regional T2 hyperintensity and swelling is non progressed, moderately extensive and tracking into the posterior temporal and parietal lobes. At the right temporoparietal resection site there is mainly linear enhancement along with the flap and brain surface that is non progressed. Some slight further collapse of the cavity has occurred since prior. No new area of enhancement.  No worrisome mass effect. Encephalomalacia centered at the left frontal operculum is unchanged and nonenhancing, per chart a remote oligodendroglioma resection site. No acute or subacute infarct, hemorrhage, hydrocephalus, or collection. Vascular: Major flow voids and vascular enhancements are preserved Skull and upper cervical  spine: Unremarkable craniotomy sites. Sinuses/Orbits: Negative IMPRESSION: Necrotic type, nodular enhancement in the right occipital lobe has increased by 4 mm since 08/04/2023, now up to 2 cm in diameter. Unchanged regional T2 hyperintensity. Mainly linear enhancement at the right temporoparietal resection cavity is non progressed. Electronically Signed   By: Ronnette Coke M.D.   On: 10/07/2023 11:21   DG ABD ACUTE 2+V W 1V CHEST Result Date: 09/25/2023 CLINICAL DATA:  Abdominal pain constipation EXAM: DG ABDOMEN ACUTE WITH 1 VIEW CHEST COMPARISON:  05/10/2012 FINDINGS: Low lung volumes with bibasilar atelectasis suspected, worse on the left. Normal heart size and vascularity. Negative for edema, effusion or pneumothorax. Trachea midline. No free air. Nonobstructive bowel gas pattern. Scattered air throughout the small and large bowel. Normal radiographic stool burden. Degenerative changes of the lumbosacral spine. No acute osseous finding. IMPRESSION: 1. Low lung volumes with bibasilar atelectasis. 2. No acute abdominal finding by plain radiography. Electronically Signed   By: Melven Stable.  Shick M.D.   On: 09/25/2023 17:18    Assessment/Plan Glioblastoma, IDH-wildtype (HCC)  Randall Lewis is clinically stable today, now having completed cycle #2 of 5-day Temodar .  MRI brain demonstrates slight further increase in enhancing mesial temporal focus; etiology is pseudoprogression or organic progerssion of disease.  [facility] team recommended continued imaging surveillance.   We recommended initiating treatment with cycle #3 Temozolomide  dose increased to 200mg /m2, on for five days and off for twenty three days in twenty eight day cycles. The patient will have a complete blood count performed on days 21 and 28 of each cycle, and a comprehensive metabolic panel performed on day 28 of each cycle. Labs may need to be performed more often. Zofran  will prescribed for home use for nausea/vomiting.   Informed consent was  obtained verbally at bedside to proceed with oral chemotherapy.  Chemotherapy should be held for the following:  ANC less than 1,000  Platelets less than 100,000  LFT or creatinine greater than 2x ULN  If clinical concerns/contraindications develop  We ask that Randall Lewis return to clinic in 1 months with labs prior to cycle #4, or  sooner as needed.  All questions were answered. The patient knows to call the clinic with any problems, questions or concerns. No barriers to learning were detected.  The total time spent in the encounter was 40 minutes and more than 50% was on counseling and review of test results   Mamie Searles, MD Medical Director of Neuro-Oncology [facility] 10/18/23 12:35 PM

## 2023-10-20 NOTE — Progress Notes (Signed)
 ____________________________________________________________  Attending physician addendum:  Thank you for sending this case to me. I have reviewed the entire note and agree with the plan.  Recommend avoiding elective endoscopic procedures at this time in a patient under treatment for brain tumor. Should he have recurrence of symptoms and imaging findings suggest the need for endoscopic evaluation, that can be reconsidered.  Lorella Roles, MD  ____________________________________________________________

## 2023-11-14 ENCOUNTER — Encounter: Payer: Self-pay | Admitting: Internal Medicine

## 2023-11-15 ENCOUNTER — Inpatient Hospital Stay: Attending: Internal Medicine

## 2023-11-15 ENCOUNTER — Other Ambulatory Visit (HOSPITAL_COMMUNITY): Payer: Self-pay

## 2023-11-15 ENCOUNTER — Other Ambulatory Visit: Payer: Self-pay

## 2023-11-15 ENCOUNTER — Inpatient Hospital Stay (HOSPITAL_BASED_OUTPATIENT_CLINIC_OR_DEPARTMENT_OTHER): Admitting: Internal Medicine

## 2023-11-15 VITALS — BP 122/77 | HR 50 | Temp 97.7°F | Resp 16 | Wt 232.5 lb

## 2023-11-15 DIAGNOSIS — C711 Malignant neoplasm of frontal lobe: Secondary | ICD-10-CM | POA: Insufficient documentation

## 2023-11-15 DIAGNOSIS — Z87891 Personal history of nicotine dependence: Secondary | ICD-10-CM | POA: Insufficient documentation

## 2023-11-15 DIAGNOSIS — Z803 Family history of malignant neoplasm of breast: Secondary | ICD-10-CM | POA: Insufficient documentation

## 2023-11-15 DIAGNOSIS — Z923 Personal history of irradiation: Secondary | ICD-10-CM | POA: Insufficient documentation

## 2023-11-15 DIAGNOSIS — Z9221 Personal history of antineoplastic chemotherapy: Secondary | ICD-10-CM | POA: Diagnosis not present

## 2023-11-15 DIAGNOSIS — C719 Malignant neoplasm of brain, unspecified: Secondary | ICD-10-CM

## 2023-11-15 LAB — CBC WITH DIFFERENTIAL (CANCER CENTER ONLY)
Abs Immature Granulocytes: 0.01 10*3/uL (ref 0.00–0.07)
Basophils Absolute: 0 10*3/uL (ref 0.0–0.1)
Basophils Relative: 0 %
Eosinophils Absolute: 0 10*3/uL (ref 0.0–0.5)
Eosinophils Relative: 0 %
HCT: 49.1 % (ref 39.0–52.0)
Hemoglobin: 16.4 g/dL (ref 13.0–17.0)
Immature Granulocytes: 0 %
Lymphocytes Relative: 24 %
Lymphs Abs: 1.1 10*3/uL (ref 0.7–4.0)
MCH: 25.7 pg — ABNORMAL LOW (ref 26.0–34.0)
MCHC: 33.4 g/dL (ref 30.0–36.0)
MCV: 77.1 fL — ABNORMAL LOW (ref 80.0–100.0)
Monocytes Absolute: 0.4 10*3/uL (ref 0.1–1.0)
Monocytes Relative: 10 %
Neutro Abs: 3 10*3/uL (ref 1.7–7.7)
Neutrophils Relative %: 66 %
Platelet Count: 196 10*3/uL (ref 150–400)
RBC: 6.37 MIL/uL — ABNORMAL HIGH (ref 4.22–5.81)
RDW: 18.6 % — ABNORMAL HIGH (ref 11.5–15.5)
WBC Count: 4.6 10*3/uL (ref 4.0–10.5)
nRBC: 0 % (ref 0.0–0.2)

## 2023-11-15 LAB — CMP (CANCER CENTER ONLY)
ALT: 13 U/L (ref 0–44)
AST: 14 U/L — ABNORMAL LOW (ref 15–41)
Albumin: 4.2 g/dL (ref 3.5–5.0)
Alkaline Phosphatase: 52 U/L (ref 38–126)
Anion gap: 6 (ref 5–15)
BUN: 18 mg/dL (ref 8–23)
CO2: 30 mmol/L (ref 22–32)
Calcium: 9.2 mg/dL (ref 8.9–10.3)
Chloride: 103 mmol/L (ref 98–111)
Creatinine: 1.25 mg/dL — ABNORMAL HIGH (ref 0.61–1.24)
GFR, Estimated: >60 mL/min (ref >60)
Glucose, Bld: 122 mg/dL — ABNORMAL HIGH (ref 70–99)
Potassium: 4.1 mmol/L (ref 3.5–5.1)
Sodium: 139 mmol/L (ref 135–145)
Total Bilirubin: 0.8 mg/dL (ref 0.0–1.2)
Total Protein: 6.8 g/dL (ref 6.5–8.1)

## 2023-11-15 MED ORDER — TEMOZOLOMIDE 180 MG PO CAPS
360.0000 mg | ORAL_CAPSULE | Freq: Every day | ORAL | 0 refills | Status: DC
  Filled 2023-11-15: qty 10, 28d supply, fill #0

## 2023-11-15 MED ORDER — TEMOZOLOMIDE 100 MG PO CAPS
100.0000 mg | ORAL_CAPSULE | Freq: Every day | ORAL | 0 refills | Status: DC
  Filled 2023-11-15: qty 5, 28d supply, fill #0

## 2023-11-15 NOTE — Progress Notes (Signed)
 [facility] at [facility] [address]  [place], [place] [address] [phone]   Interval Evaluation  Date of Service: 11/15/23 Patient Name: TORIS LAVERDIERE Patient MRN: [id number] Patient DOB: 05-26-1956 Provider: Mamie Searles, MD  Identifying Statement:  BAYLEY HURN is a 68 y.o. male with right frontal glioblastoma    Oncologic History: Anaplastic oligodendroglioma of frontal lobe (CMS/HHS-HCC)  04/16/2005 Surgery  Craniotomy for resection of left frontal lesion. 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 Lost to follow up after 03/18/2010  05/02/2023 Presentation  Presentation to the [facility] at [facility]. Recommendations pending final pathology. Likely XRT/Temozolomide  as this is a high grade glioma. 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 Pathology reveals Infiltrating High Grade Glioma pending final molecular studies for integrated diagnosis.  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  Overall he tolerated the Temodar  well this month.  No new or progressive changes reported today.  No headaches or seizures.    H+P (05/19/23) Patient presents for follow up after craniotomy, resection of recurrent primary brain tumor at [facility] on 04/18/23.  He tolerated surgery well, no ill effects.  He is functionally intact, no further headaches, no seizures.  Recently increased blood pressure medication with his PCP.  Has visit with radiation oncology upcoming as well.  Medications: Current Outpatient Medications on File Prior to Visit  Medication Sig Dispense Refill   acetaminophen  (TYLENOL ) 325 MG tablet Take 975 mg by mouth every 6 (six) hours as needed.     amLODipine (NORVASC) 5 MG tablet Take 5 mg by mouth daily.     buPROPion (WELLBUTRIN SR) 100 MG 12 hr tablet Take 100 mg by mouth every morning.     JARDIANCE 25 MG TABS tablet Take 25 mg by mouth daily.     losartan-hydrochlorothiazide  (HYZAAR) 100-12.5 MG tablet Take 1 tablet by mouth daily.     metFORMIN (GLUCOPHAGE-XR) 500 MG 24 hr tablet Take 1,000 mg by mouth daily.     metoprolol tartrate (LOPRESSOR) 12.5 mg TABS tablet Take 12.5 mg by mouth daily.     ondansetron  (ZOFRAN ) 8 MG tablet Take  1 tablet (8 mg total) by mouth every 8 (eight) hours as needed for nausea or vomiting. May take 30-60  minutes prior to Temodar  administration if nausea/vomiting occurs as needed. 30 tablet 1   oxyCODONE  (OXY IR/ROXICODONE ) 5 MG immediate release tablet Take 5 mg by mouth every 4 (four) hours as needed.     pioglitazone (ACTOS) 15 MG tablet Take 15 mg by mouth daily.     predniSONE  (DELTASONE ) 50 MG tablet Take 1 tablet at 13 hours, 7 hours, and 1 hour prior to IV contrast 9 tablet 0   simvastatin (ZOCOR) 20 MG tablet Take 20 mg by mouth daily.     tamsulosin  (FLOMAX ) 0.4 MG CAPS capsule Take 0.4 mg by mouth daily.     temozolomide  (TEMODAR ) 100 MG capsule Take 1 capsule (100 mg total) by mouth daily. (Take with TWO 180 mg capsules for total daily dose of 460 mg). Take for 5 days on, 23 days off. Repeat every 28 days. May take on an empty stomach to decrease nausea & vomiting. (Patient not taking: Reported on 11/15/2023) 5 capsule 0   temozolomide  (TEMODAR ) 180 MG capsule Take 2 capsules (360 mg total) by mouth daily. (Take with ONE 100 mg capsules for total daily dose of 460 mg). Take for 5 days on, 23 days off. Repeat every 28 days. May take on an empty stomach to decrease nausea & vomiting. (Patient not taking: Reported on 11/15/2023) 10 capsule 0   No current facility-administered medications on file prior to visit.    Allergies:  Allergies  Allergen Reactions   Sildenafil Other (See Comments) and Nausea Only   Gadolinium Derivatives Nausea And Vomiting    Per pt , always get sick with gado and never offered anti nausea meds.   Past Medical History:  Past Medical History:  Diagnosis Date   Anxiety    BPH (benign prostatic hyperplasia)    Cancer (HCC) 05/31/2004   Brain tumor, in remission after chemo radiation and surgery   Diabetes mellitus without complication (HCC)    ED (erectile dysfunction)    Heart murmur    History of heart murmur   Hyperlipidemia    Hypertension    Sleep apnea    USES C-PAP   UTI (urinary tract infection)    Past Surgical History:  Past Surgical  History:  Procedure Laterality Date   BRAIN SURGERY  2006   BRAIN TUMOR  - CANCER   COLONOSCOPY     CYSTOSCOPY N/A 07/29/2015   Procedure: FLEXIBLE CYSTOSCOPY;  Surgeon: Homero Luster, MD;  Location: [facility];  Service: Urology;  Laterality: N/A;   FOOT BONE EXCISION  1989   PENILE PROSTHESIS IMPLANT N/A 07/29/2015   Procedure: PENILE PROSTHESIS;  Surgeon: Homero Luster, MD;  Location: [facility];  Service: Urology;  Laterality: N/A;   right knee meniscus repair  2010   THYROID  LOBECTOMY  05/12/2012   Procedure: THYROID  LOBECTOMY;  Surgeon: Keitha Pata, MD;  Location: [facility];  Service: General;  Laterality: Left;  left thyroid  lobectomy   Social History:  Social History   Socioeconomic History   Marital status: Married    Spouse name: [name]   Number of children: 2   Years of education: Not on file   Highest education level: Master's degree (e.g., MA, MS, MEng, MEd, MSW, MBA)  Occupational History   Not on file  Tobacco Use   Smoking status: Former    Current packs/day: 0.00    Average packs/day: 1 pack/day  for 23.0 years (23.0 ttl pk-yrs)    Types: Cigarettes    Start date: 04/26/1973    Quit date: 04/26/1996    Years since quitting: 27.5   Smokeless tobacco: Never  Vaping Use   Vaping status: Never Used  Substance and Sexual Activity   Alcohol use: No   Drug use: No   Sexual activity: Not on file  Other Topics Concern   Not on file  Social History Narrative   Lives with wife   Right handed   Caffeine: 2 cups of coffee and 1 glass of tea/soda a day   Social Drivers of Corporate investment banker Strain: Low Risk  (05/01/2023)   Received from [facility]   Overall Financial Resource Strain (CARDIA)    Difficulty of Paying Living Expenses: Not hard at all  Food Insecurity: Low Risk  (11/01/2023)   Received from [facility]   Hunger Vital Sign    Within the past 12 months, you worried that your food would run out before you got money to buy more: Never true     Within the past 12 months, the food you bought just didn't last and you didn't have money to get more. : Never true  Transportation Needs: No Transportation Needs (11/01/2023)   Received from [facility]    In the past 12 months, has lack of reliable transportation kept you from medical appointments, meetings, work or from getting things needed for daily living? : No  Physical Activity: Not on file  Stress: Not on file  Social Connections: Not on file  Intimate Partner Violence: Not At Risk (05/20/2023)   Humiliation, Afraid, Rape, and Kick questionnaire    Fear of Current or Ex-Partner: No    Emotionally Abused: No    Physically Abused: No    Sexually Abused: No   Family History:  Family History  Problem Relation Age of Onset   Breast cancer Mother    Cancer Brother 13   Heart attack Brother    Heart attack Brother    Birth defects Son    Colon cancer Neg Hx    Liver disease Neg Hx    Esophageal cancer Neg Hx     Review of Systems: Constitutional: Doesn't report fevers, chills or abnormal weight loss Eyes: Doesn't report blurriness of vision Ears, nose, mouth, throat, and face: Doesn't report sore throat Respiratory: Doesn't report cough, dyspnea or wheezes Cardiovascular: Doesn't report palpitation, chest discomfort  Gastrointestinal:  Doesn't report nausea, constipation, diarrhea GU: Doesn't report incontinence Skin: Doesn't report skin rashes Neurological: Per HPI Musculoskeletal: Doesn't report joint pain Behavioral/Psych: Doesn't report anxiety  Physical Exam: Vitals:   11/15/23 1236  BP: 122/77  Pulse: (!) 50  Resp: 16  Temp: 97.7 F (36.5 C)  SpO2: 96%     KPS: 90. General: Alert, cooperative, pleasant, in no acute distress Head: Normal EENT: No conjunctival injection or scleral icterus.  Lungs: Resp effort normal Cardiac: Regular rate Abdomen: Non-distended abdomen Skin: No rashes cyanosis or petechiae. Extremities: No  clubbing or edema  Neurologic Exam: Mental Status: Awake, alert, attentive to examiner. Oriented to self and environment. Language is fluent with intact comprehension.  Cranial Nerves: Visual acuity is grossly normal. Visual fields are full. Extra-ocular movements intact. No ptosis. Face is symmetric Motor: Tone and bulk are normal. Power is full in both arms and legs. Reflexes are symmetric, no pathologic reflexes present.  Sensory: Intact to light touch Gait: Normal.  Labs: I have reviewed the data as listed    Component Value Date/Time   NA 139 11/15/2023 1159   K 4.1 11/15/2023 1159   CL 103 11/15/2023 1159   CO2 30 11/15/2023 1159   GLUCOSE 122 (H) 11/15/2023 1159   BUN 18 11/15/2023 1159   CREATININE 1.25 (H) 11/15/2023 1159   CREATININE 1.31 (H) 02/25/2012 1438   CALCIUM 9.2 11/15/2023 1159   PROT 6.8 11/15/2023 1159   ALBUMIN 4.2 11/15/2023 1159   AST 14 (L) 11/15/2023 1159   ALT 13 11/15/2023 1159   ALKPHOS 52 11/15/2023 1159   BILITOT 0.8 11/15/2023 1159   GFRNONAA >60 11/15/2023 1159   GFRNONAA >60 02/25/2012 1438   GFRAA >60 08/14/2019 1438   GFRAA >60 02/25/2012 1438   Lab Results  Component Value Date   WBC 4.6 11/15/2023   NEUTROABS 3.0 11/15/2023   HGB 16.4 11/15/2023   HCT 49.1 11/15/2023   MCV 77.1 (L) 11/15/2023   PLT 196 11/15/2023      Assessment/Plan Glioblastoma, IDH-wildtype (HCC)  Jereld Monas is clinically stable today, now having completed cycle #3 of 5-day Temodar .  Labs are within normal limits today.  We recommended continuing treatment with cycle #4 Temozolomide  200mg /m2, on for five days and off for twenty three days in twenty eight day cycles. The patient will have a complete blood count performed on days 21 and 28 of each cycle, and a comprehensive metabolic panel performed on day 28 of each cycle. Labs may need to be performed more often. Zofran  will prescribed for home use for nausea/vomiting.   Informed consent was obtained  verbally at bedside to proceed with oral chemotherapy.  Chemotherapy should be held for the following:  ANC less than 1,000  Platelets less than 100,000  LFT or creatinine greater than 2x ULN  If clinical concerns/contraindications develop  We ask that Jereld Monas return to clinic in 1 months with MRI brain prior to cycle #5, or sooner as needed.  All questions were answered. The patient knows to call the clinic with any problems, questions or concerns. No barriers to learning were detected.  The total time spent in the encounter was 40 minutes and more than 50% was on counseling and review of test results   Mamie Searles, MD Medical Director of Neuro-Oncology [facility] at [facility] 11/15/23 12:54 PM

## 2023-11-15 NOTE — Progress Notes (Signed)
 Specialty Pharmacy Refill Coordination Note  Randall Lewis is a 68 y.o. male contacted today regarding refills of specialty medication(s) Temozolomide  (TEMODAR )   Patient requested Randall Lewis at [facility] at [place] date: 11/16/23   Medication will be filled on 06.17.25.

## 2023-11-16 ENCOUNTER — Telehealth: Payer: Self-pay | Admitting: Internal Medicine

## 2023-11-16 NOTE — Telephone Encounter (Signed)
 Scheduled appointments per 6/17 los. Called and left a VM with appointment details.

## 2023-12-01 ENCOUNTER — Other Ambulatory Visit: Payer: Self-pay

## 2023-12-18 ENCOUNTER — Encounter (HOSPITAL_COMMUNITY): Payer: Self-pay

## 2023-12-19 ENCOUNTER — Telehealth: Payer: Self-pay | Admitting: *Deleted

## 2023-12-19 NOTE — Telephone Encounter (Signed)
 Patient returned my phone call, he contacted this office on Friday saying he is having some issues with driving.  Patient describes have a disconnect at times while driving, he loses his frame of reference and has trouble distinguishing where to turn or not. He had to pull over & have someone else drive once. Patient has upcoming MRI brain on 12/22/23 & MD appointment on 12/26/23.  Dr Buckley informed, patient is not to drive until his appointment on the 28th.  Patient verbalizes understanding.

## 2023-12-22 ENCOUNTER — Other Ambulatory Visit: Payer: Self-pay | Admitting: Radiation Therapy

## 2023-12-22 ENCOUNTER — Ambulatory Visit (HOSPITAL_COMMUNITY)
Admission: RE | Admit: 2023-12-22 | Discharge: 2023-12-22 | Disposition: A | Discharge status: Home / Self Care | Source: Ambulatory Visit | Attending: Internal Medicine | Admitting: Internal Medicine

## 2023-12-22 DIAGNOSIS — C719 Malignant neoplasm of brain, unspecified: Secondary | ICD-10-CM | POA: Diagnosis present

## 2023-12-22 MED ORDER — GADOBUTROL 1 MMOL/ML IV SOLN
10.0000 mL | Freq: Once | INTRAVENOUS | Status: AC | PRN
  Administered 2023-12-22: 10 mL via INTRAVENOUS

## 2023-12-26 ENCOUNTER — Encounter: Payer: Self-pay | Admitting: *Deleted

## 2023-12-26 ENCOUNTER — Other Ambulatory Visit: Payer: Self-pay

## 2023-12-26 ENCOUNTER — Inpatient Hospital Stay: Attending: Internal Medicine

## 2023-12-26 ENCOUNTER — Inpatient Hospital Stay (HOSPITAL_BASED_OUTPATIENT_CLINIC_OR_DEPARTMENT_OTHER): Admitting: Internal Medicine

## 2023-12-26 ENCOUNTER — Encounter

## 2023-12-26 VITALS — BP 133/80 | HR 54 | Temp 98.6°F | Resp 16 | Wt 231.6 lb

## 2023-12-26 DIAGNOSIS — Z809 Family history of malignant neoplasm, unspecified: Secondary | ICD-10-CM | POA: Diagnosis not present

## 2023-12-26 DIAGNOSIS — C719 Malignant neoplasm of brain, unspecified: Secondary | ICD-10-CM

## 2023-12-26 DIAGNOSIS — C711 Malignant neoplasm of frontal lobe: Secondary | ICD-10-CM | POA: Insufficient documentation

## 2023-12-26 DIAGNOSIS — Z87891 Personal history of nicotine dependence: Secondary | ICD-10-CM | POA: Diagnosis not present

## 2023-12-26 DIAGNOSIS — Z803 Family history of malignant neoplasm of breast: Secondary | ICD-10-CM | POA: Diagnosis not present

## 2023-12-26 LAB — CMP (CANCER CENTER ONLY)
ALT: 17 U/L (ref 0–44)
AST: 13 U/L — ABNORMAL LOW (ref 15–41)
Albumin: 4 g/dL (ref 3.5–5.0)
Alkaline Phosphatase: 48 U/L (ref 38–126)
Anion gap: 5 (ref 5–15)
BUN: 20 mg/dL (ref 8–23)
CO2: 29 mmol/L (ref 22–32)
Calcium: 9 mg/dL (ref 8.9–10.3)
Chloride: 104 mmol/L (ref 98–111)
Creatinine: 1.3 mg/dL — ABNORMAL HIGH (ref 0.61–1.24)
GFR, Estimated: >60 mL/min (ref >60)
Glucose, Bld: 159 mg/dL — ABNORMAL HIGH (ref 70–99)
Potassium: 4.2 mmol/L (ref 3.5–5.1)
Sodium: 138 mmol/L (ref 135–145)
Total Bilirubin: 0.7 mg/dL (ref 0.0–1.2)
Total Protein: 6.8 g/dL (ref 6.5–8.1)

## 2023-12-26 LAB — CBC WITH DIFFERENTIAL (CANCER CENTER ONLY)
Abs Immature Granulocytes: 0.02 K/uL (ref 0.00–0.07)
Basophils Absolute: 0 K/uL (ref 0.0–0.1)
Basophils Relative: 0 %
Eosinophils Absolute: 0 K/uL (ref 0.0–0.5)
Eosinophils Relative: 0 %
HCT: 50.4 % (ref 39.0–52.0)
Hemoglobin: 16.7 g/dL (ref 13.0–17.0)
Immature Granulocytes: 0 %
Lymphocytes Relative: 23 %
Lymphs Abs: 1 K/uL (ref 0.7–4.0)
MCH: 26 pg (ref 26.0–34.0)
MCHC: 33.1 g/dL (ref 30.0–36.0)
MCV: 78.5 fL — ABNORMAL LOW (ref 80.0–100.0)
Monocytes Absolute: 0.4 K/uL (ref 0.1–1.0)
Monocytes Relative: 8 %
Neutro Abs: 3 K/uL (ref 1.7–7.7)
Neutrophils Relative %: 69 %
Platelet Count: 186 K/uL (ref 150–400)
RBC: 6.42 MIL/uL — ABNORMAL HIGH (ref 4.22–5.81)
RDW: 18.1 % — ABNORMAL HIGH (ref 11.5–15.5)
WBC Count: 4.5 K/uL (ref 4.0–10.5)
nRBC: 0 % (ref 0.0–0.2)

## 2023-12-26 NOTE — Progress Notes (Signed)
 Requested that images from MRI brain on 12/22/23 be pushed to [facility] through [employer].

## 2023-12-26 NOTE — Progress Notes (Signed)
DISCONTINUE ON PATHWAY REGIMEN - Neuro ? ? ?  One cycle, concurrent with RT: ?    Temozolomide  ? ?**Always confirm dose/schedule in your pharmacy ordering system** ? ?REASON: Continuation Of Treatment ?PRIOR TREATMENT: [id number]: Radiation Therapy with Concurrent Temozolomide 75 mg/m2 Daily x 6 Weeks, Followed by Adjuvant Temozolomide ?TREATMENT RESPONSE: Progressive Disease (PD) ? ?START ON PATHWAY REGIMEN - Neuro ? ? ?  A cycle is every 14 days: ?    Bevacizumab-xxxx  ? ?**Always confirm dose/schedule in your pharmacy ordering system** ? ?Patient Characteristics: ?Glioma, Glioblastoma, IDH-wildtype, Recurrent or Progressive, Nonsurgical Candidate, Systemic Therapy Candidate, BRAF V600E Mutation Negative/Unknown and NTRK Fusion Negative/Unknown ?Disease Classification: Glioma ?Disease Classification: Glioblastoma, IDH-wildtype ?Disease Status: Recurrent or Progressive ?Treatment Classification: Nonsurgical Candidate ?Treatment (Nonsurgical/Adjuvant): Systemic Therapy Candidate ?NTRK Gene Fusion Status: Negative ?BRAF V600E Mutation Status: Negative ?Intent of Therapy: ?Non-Curative / Palliative Intent, Discussed with Patient ?

## 2023-12-26 NOTE — Progress Notes (Signed)
 [facility] at [facility] [address]  [place], [place] [address] [phone]   Interval Evaluation  Date of Service: 12/26/23 Patient Name: Randall Lewis Patient MRN: [id number] Patient DOB: 04-02-1956 Provider: Arthea MARLA Manns, MD  Identifying Statement:  Randall Lewis is a 68 y.o. male with right frontal glioblastoma    Oncologic History: Anaplastic oligodendroglioma of frontal lobe (CMS/HHS-HCC)  04/16/2005 Surgery  Craniotomy for resection of left frontal lesion. 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 Lost to follow up after 03/18/2010  05/02/2023 Presentation  Presentation to the [facility] at [facility]. Recommendations pending final pathology. Likely XRT/Temozolomide  as this is a high grade glioma. 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 Pathology reveals Infiltrating High Grade Glioma pending final molecular studies for integrated diagnosis.  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  Overall he continues to tolerate the Temodar  well.  Today he does report impaired vision on the left side, he has been unable to drive safely as a result of this.  He also reports recognizing faces in people that he doesn't know.  Otherwise his walking is independent.  No headaches or seizures.    H+P (05/19/23) Patient presents for follow up after craniotomy, resection of recurrent primary brain tumor at [facility] on 04/18/23.  He tolerated surgery well, no ill effects.  He is functionally intact, no further headaches, no seizures.  Recently increased blood pressure medication with his PCP.  Has visit with radiation oncology upcoming as well.  Medications: Current Outpatient Medications on File Prior to Visit  Medication Sig Dispense Refill   acetaminophen  (TYLENOL ) 325 MG tablet Take 975 mg by mouth every 6 (six) hours as needed.     amLODipine (NORVASC) 5 MG tablet Take 5 mg by mouth daily.     buPROPion (WELLBUTRIN SR) 100 MG 12 hr tablet Take 100 mg by mouth every morning.     JARDIANCE 25 MG TABS tablet Take 25 mg by mouth daily.     losartan-hydrochlorothiazide  (HYZAAR) 100-12.5 MG tablet Take 1 tablet by mouth daily.     metFORMIN (GLUCOPHAGE-XR) 500 MG 24 hr tablet  Take 1,000 mg by mouth daily.     metoprolol tartrate (LOPRESSOR) 12.5 mg TABS tablet  Take 12.5 mg by mouth daily.     ondansetron  (ZOFRAN ) 8 MG tablet Take 1 tablet (8 mg total) by mouth every 8 (eight) hours as needed for nausea or vomiting. May take 30-60 minutes prior to Temodar  administration if nausea/vomiting occurs as needed. 30 tablet 1   oxyCODONE  (OXY IR/ROXICODONE ) 5 MG immediate release tablet Take 5 mg by mouth every 4 (four) hours as needed.     pioglitazone (ACTOS) 15 MG tablet Take 15 mg by mouth daily.     predniSONE  (DELTASONE ) 50 MG tablet Take 1 tablet at 13 hours, 7 hours, and 1 hour prior to IV contrast 9 tablet 0   simvastatin (ZOCOR) 20 MG tablet Take 20 mg by mouth daily.     tamsulosin  (FLOMAX ) 0.4 MG CAPS capsule Take 0.4 mg by mouth daily.     temozolomide  (TEMODAR ) 100 MG capsule Take 1 capsule (100 mg total) by mouth daily. (Take with TWO 180 mg capsules for total daily dose of 460 mg). Take for 5 days on, 23 days off. Repeat every 28 days. May take on an empty stomach to decrease nausea & vomiting. (Patient not taking: Reported on 11/15/2023) 5 capsule 0   temozolomide  (TEMODAR ) 100 MG capsule Take 1 capsule (100 mg total) by mouth daily. (Take with TWO 180 mg capsules for total daily dose of 460 mg). Take for 5 days on, 23 days off. Repeat every 28 days. May take on an empty stomach to decrease nausea & vomiting. 5 capsule 0   temozolomide  (TEMODAR ) 180 MG capsule Take 2 capsules (360 mg total) by mouth daily. (Take with ONE 100 mg capsules for total daily dose of 460 mg). Take for 5 days on, 23 days off. Repeat every 28 days. May take on an empty stomach to decrease nausea & vomiting. (Patient not taking: Reported on 11/15/2023) 10 capsule 0   temozolomide  (TEMODAR ) 180 MG capsule Take 2 capsules (360 mg total) by mouth daily. (Take with ONE 100 mg capsules for total daily dose of 460 mg). Take for 5 days on, 23 days off. Repeat every 28 days. May take on an empty stomach to decrease nausea & vomiting. 10 capsule 0   No current facility-administered  medications on file prior to visit.    Allergies:  Allergies  Allergen Reactions   Sildenafil Other (See Comments) and Nausea Only   Gadolinium Derivatives Nausea And Vomiting    Per pt , always get sick with gado and never offered anti nausea meds.   Past Medical History:  Past Medical History:  Diagnosis Date   Anxiety    BPH (benign prostatic hyperplasia)    Cancer (HCC) 05/31/2004   Brain tumor, in remission after chemo radiation and surgery   Diabetes mellitus without complication (HCC)    ED (erectile dysfunction)    Heart murmur    History of heart murmur   Hyperlipidemia    Hypertension    Sleep apnea    USES C-PAP   UTI (urinary tract infection)    Past Surgical History:  Past Surgical History:  Procedure Laterality Date   BRAIN SURGERY  2006   BRAIN TUMOR  - CANCER   COLONOSCOPY     CYSTOSCOPY N/A 07/29/2015   Procedure: FLEXIBLE CYSTOSCOPY;  Surgeon: Norleen Seltzer, MD;  Location: [facility];  Service: Urology;  Laterality: N/A;   FOOT BONE EXCISION  1989  PENILE PROSTHESIS IMPLANT N/A 07/29/2015   Procedure: PENILE PROSTHESIS;  Surgeon: Norleen Seltzer, MD;  Location: [facility];  Service: Urology;  Laterality: N/A;   right knee meniscus repair  2010   THYROID  LOBECTOMY  05/12/2012   Procedure: THYROID  LOBECTOMY;  Surgeon: Krystal CHRISTELLA Spinner, MD;  Location: [facility];  Service: General;  Laterality: Left;  left thyroid  lobectomy   Social History:  Social History   Socioeconomic History   Marital status: Married    Spouse name: [name]   Number of children: 2   Years of education: Not on file   Highest education level: Master's degree (e.g., MA, MS, MEng, MEd, MSW, MBA)  Occupational History   Not on file  Tobacco Use   Smoking status: Former    Current packs/day: 0.00    Average packs/day: 1 pack/day for 23.0 years (23.0 ttl pk-yrs)    Types: Cigarettes    Start date: 04/26/1973    Quit date: 04/26/1996    Years since quitting: 27.6   Smokeless tobacco: Never  Vaping  Use   Vaping status: Never Used  Substance and Sexual Activity   Alcohol use: No   Drug use: No   Sexual activity: Not on file  Other Topics Concern   Not on file  Social History Narrative   Lives with wife   Right handed   Caffeine: 2 cups of coffee and 1 glass of tea/soda a day   Social Drivers of Corporate investment banker Strain: Low Risk  (05/01/2023)   Received from [facility]   Overall Financial Resource Strain (CARDIA)    Difficulty of Paying Living Expenses: Not hard at all  Food Insecurity: Low Risk  (11/01/2023)   Received from [facility]   Hunger Vital Sign    Within the past 12 months, you worried that your food would run out before you got money to buy more: Never true    Within the past 12 months, the food you bought just didn't last and you didn't have money to get more. : Never true  Transportation Needs: No Transportation Needs (11/01/2023)   Received from [facility]    In the past 12 months, has lack of reliable transportation kept you from medical appointments, meetings, work or from getting things needed for daily living? : No  Physical Activity: Not on file  Stress: Not on file  Social Connections: Not on file  Intimate Partner Violence: Not At Risk (05/20/2023)   Humiliation, Afraid, Rape, and Kick questionnaire    Fear of Current or Ex-Partner: No    Emotionally Abused: No    Physically Abused: No    Sexually Abused: No   Family History:  Family History  Problem Relation Age of Onset   Breast cancer Mother    Cancer Brother 26   Heart attack Brother    Heart attack Brother    Birth defects Son    Colon cancer Neg Hx    Liver disease Neg Hx    Esophageal cancer Neg Hx     Review of Systems: Constitutional: Doesn't report fevers, chills or abnormal weight loss Eyes: Doesn't report blurriness of vision Ears, nose, mouth, throat, and face: Doesn't report sore throat Respiratory: Doesn't report cough,  dyspnea or wheezes Cardiovascular: Doesn't report palpitation, chest discomfort  Gastrointestinal:  Doesn't report nausea, constipation, diarrhea GU: Doesn't report incontinence Skin: Doesn't report skin rashes Neurological: Per HPI Musculoskeletal: Doesn't report joint pain Behavioral/Psych: Doesn't report anxiety  Physical Exam: Vitals:   12/26/23 1134  BP: 133/80  Pulse: (!) 54  Resp: 16  Temp: 98.6 F (37 C)  SpO2: 98%    KPS: 70 General: Alert, cooperative, pleasant, in no acute distress Head: Normal EENT: No conjunctival injection or scleral icterus.  Lungs: Resp effort normal Cardiac: Regular rate Abdomen: Non-distended abdomen Skin: No rashes cyanosis or petechiae. Extremities: No clubbing or edema  Neurologic Exam: Mental Status: Awake, alert, attentive to examiner. Oriented to self and environment. Language is fluent with intact comprehension.  Cranial Nerves: Visual acuity is grossly normal. Left hemianopia. Extra-ocular movements intact. No ptosis. Face is symmetric Motor: Tone and bulk are normal. Power is full in both arms and legs. Reflexes are symmetric, no pathologic reflexes present.  Sensory: Intact to light touch Gait: Normal.   Labs: I have reviewed the data as listed    Component Value Date/Time   NA 139 11/15/2023 1159   K 4.1 11/15/2023 1159   CL 103 11/15/2023 1159   CO2 30 11/15/2023 1159   GLUCOSE 122 (H) 11/15/2023 1159   BUN 18 11/15/2023 1159   CREATININE 1.25 (H) 11/15/2023 1159   CREATININE 1.31 (H) 02/25/2012 1438   CALCIUM 9.2 11/15/2023 1159   PROT 6.8 11/15/2023 1159   ALBUMIN 4.2 11/15/2023 1159   AST 14 (L) 11/15/2023 1159   ALT 13 11/15/2023 1159   ALKPHOS 52 11/15/2023 1159   BILITOT 0.8 11/15/2023 1159   GFRNONAA >60 11/15/2023 1159   GFRNONAA >60 02/25/2012 1438   GFRAA >60 08/14/2019 1438   GFRAA >60 02/25/2012 1438   Lab Results  Component Value Date   WBC 4.6 11/15/2023   NEUTROABS 3.0 11/15/2023   HGB 16.4  11/15/2023   HCT 49.1 11/15/2023   MCV 77.1 (L) 11/15/2023   PLT 196 11/15/2023    Imaging:  [facility] Clinician Interpretation: I have personally reviewed the CNS images as listed.  My interpretation, in the context of the patient's clinical presentation, is treatment effect vs true progression  MR BRAIN W WO CONTRAST Result Date: 12/22/2023 CLINICAL DATA:  Brain/CNS neoplasm, assess treatment response EXAM: MRI HEAD WITHOUT AND WITH CONTRAST TECHNIQUE: Multiplanar, multiecho pulse sequences of the brain and surrounding structures were obtained without and with intravenous contrast. CONTRAST:  10mL GADAVIST  GADOBUTROL  1 MMOL/ML IV SOLN COMPARISON:  MRI of the head dated Oct 07, 2023. FINDINGS: Brain: A centrally necrotic peripherally enhancing lesion within the medial aspect of the right occipital lobe as increased in size in the interim from 20 x 20 x 10 mm to approximately 45 x 29 x 28 mm. There is also more extensive T2 signal hyperintensity within the surrounding occipital, parietal and temporal lobes. There is a new band of restricted diffusion along the medial ependymal surface of the posterior horn of the right lateral ventricle. There is more hemosiderin staining within the peripherally enhancing lesion. There continue to be numerous foci of hemosiderin staining within the frontal lobes bilaterally, particularly on the left. There are chronic encephalomalacia changes also again demonstrated within the temporal lobe from prior resection. There is no residual tumor evident at the frontal lobe resection site. Vascular: Normal flow voids. Skull and upper cervical spine: Status post right temporoparietal craniotomy and left pterional craniotomy. Sinuses/Orbits: Clear paranasal sinuses.  Normal orbits. Other: None. IMPRESSION: 1. There is been significant interval enlargement of a peripherally enhancing, centrally necrotic lesion within the right medial occipital lobe. There is evidence of intralesional  hemorrhage and worsening surrounding T2 signal hyperintensity.  There is also new restricted diffusion along the medial ependymal surface of the posterior horn of the right lateral ventricle. 2. Stable left frontal lobe surgical encephalomalacia changes. Electronically Signed   By: Evalene Coho M.D.   On: 12/22/2023 11:49   Assessment/Plan Glioblastoma, IDH-wildtype (HCC)  Lamar LITTIE Lewis is clinically progressive today, now having completed cycle #4 of 5-day Temodar .  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 MRI brain demonstrates marked progression of disease within right occipital lobe, secondary to primary site of disease/resection.  This region was within the high dose radiation field, but degree and timing of changes are most concerning for progression of disease.  Post-RT inflamattory changes are also possible, especially given very rapid rate of growth.    We recommended holding current treatment plan until patient can get evaluated later this week by his primary [facility] team.  Given progressive findings and focal symptoms, treatment recommendation may include avastin 10mg /kg IV given its potential role in both radiation injury and progressive neoplasm.  Chemotherapy could be held, or could continue prior Temodar , or transition to second line agent.  We also discussed the utility of FDG-PET for tumor vs necrosis characterization.  He is agreeable to both of these (avastin, PET scan) if recommended.  [facility] team may also discuss biopsy, but this would delay onset of avastin due to interference with wound healing.    We will get imaging shared over promptly.  We ask that Lamar LITTIE Lewis return to clinic for upcoming treatment plan, pending [facility] input.    All questions were answered. The patient knows to call the clinic with any problems, questions or concerns. No barriers to learning were detected.  The total time spent in the encounter was 40 minutes and more than  50% was on counseling and review of test results   Arthea MARLA Manns, MD Medical Director of Neuro-Oncology [facility] at [facility] 12/26/23 11:21 AM

## 2023-12-27 ENCOUNTER — Other Ambulatory Visit: Payer: Self-pay | Admitting: Radiation Therapy

## 2024-01-02 ENCOUNTER — Other Ambulatory Visit: Payer: Self-pay

## 2024-01-02 ENCOUNTER — Other Ambulatory Visit: Payer: Self-pay | Admitting: *Deleted

## 2024-01-02 ENCOUNTER — Encounter: Payer: Self-pay | Admitting: Internal Medicine

## 2024-01-02 ENCOUNTER — Telehealth: Payer: Self-pay | Admitting: *Deleted

## 2024-01-02 NOTE — Progress Notes (Signed)
 Per addendum on [place] office visit (12/26/23), [facility] wants patient to start Bevacizumab  asap. Temodar  was discontinued. Patient disenrolled.

## 2024-01-02 NOTE — Progress Notes (Signed)
 Per [facility] rec's, wanting pt to start Bevacizumab  ASAP.  No recent biopsy or procedures that would delay starting per Dr. Buckley. Dr Buckley is Rx'ing CCNU component of treatment plan today (01/02/24) Confirmed w/ IV tech, we have plenty of Vegzelma  on hand.

## 2024-01-02 NOTE — Telephone Encounter (Signed)
 PC to patient, informed him Dr Buckley wants to proceed with Avastin  infusion this week.  We would like to schedule lab, MD and infusion appointments Thursday afternoon, 01/05/24.  Patient is agreeable, appointments made.  Patient verbalizes understanding.

## 2024-01-03 ENCOUNTER — Other Ambulatory Visit: Payer: Self-pay

## 2024-01-03 ENCOUNTER — Other Ambulatory Visit: Payer: Self-pay | Admitting: Internal Medicine

## 2024-01-03 ENCOUNTER — Telehealth: Payer: Self-pay | Admitting: Pharmacist

## 2024-01-03 ENCOUNTER — Other Ambulatory Visit (HOSPITAL_COMMUNITY): Payer: Self-pay

## 2024-01-03 DIAGNOSIS — C719 Malignant neoplasm of brain, unspecified: Secondary | ICD-10-CM

## 2024-01-03 MED ORDER — LOMUSTINE 100 MG PO CAPS
200.0000 mg | ORAL_CAPSULE | Freq: Once | ORAL | 0 refills | Status: DC
  Filled 2024-01-03: qty 2, 30d supply, fill #0

## 2024-01-03 MED ORDER — LOMUSTINE 100 MG PO CAPS: 100.0000 mg | ORAL_CAPSULE | Freq: Once | ORAL | 0 refills | Status: DC

## 2024-01-03 NOTE — Telephone Encounter (Addendum)
 Oral Chemotherapy Pharmacist Encounter  I spoke with patient for overview of: Gleostine  (lomustine ) for the treatment of glioblastoma multiforme in conjunction with bevacizumab , planned duration ~ 6 cycles or until disease progression or unacceptable toxicity.  Counseled patient on administration, dosing, side effects, monitoring, drug-food interactions, safe handling, storage, and disposal.  Patient will take lomustine  100mg  capsules, 2 capsules (200mg ) by mouth once daily, may take at bedtime and on an empty stomach to decrease nausea and vomiting.  Lomustine  will be administered once every 6 weeks.  Lomustine  start date: pending - 01/05/24 PM (patient has MD OV 01/05/24 AM to confirm start date)   Adverse effects include but are not limited to: nausea, vomiting, decreased blood counts, alopecia, hepatotoxicity, and pulmonary toxicity. N/V PPX: Patient will take Zofran  8mg  tablet, 1 tablet by mouth 30-60 min prior to lomustine  dose to help decrease N/V.  Reviewed with patient importance of keeping a medication schedule and plan for any missed doses. No barriers to medication adherence identified.  Medication reconciliation performed and medication/allergy list updated.  All questions answered.  Distress thermometer not completed during telephone call as patient has been on previous lines of therapy.   Randall Lewis voiced understanding and appreciation.   Medication education handout placed in mail for patient. Patient knows to call the office with questions or concerns. Oral Chemotherapy Clinic phone number provided to patient.   [name], PharmD, BCPS, BCOP Hematology/Oncology Clinical Pharmacist [phone] 01/03/2024 11:11 AM

## 2024-01-03 NOTE — Progress Notes (Signed)
 Specialty Pharmacy Initial Fill Coordination Note  Randall Lewis is a 68 y.o. male contacted today regarding refills of specialty medication(s) Lomustine  (GLEOSTINE ) .  Patient requested [name] at [facility] at [place]  on 01/04/24   Medication will be filled on 01/03/24.   Patient is aware of $43.00 copayment.   Lucie Lamer, CPhT [place]  [facility] Specialty Pharmacy Services Pharmacy Technician Patient Advocate Specialist II [place] Phone: [phone]  Fax: [phone] [email or web address]

## 2024-01-03 NOTE — Telephone Encounter (Signed)
 Oral Oncology Pharmacist Encounter  Received new prescription for Gleostine  (lomustine ) for the treatment of GBM in conjunction with bevacizumab , planned duration ~6 cycles or until disease progression or unacceptable drug toxicity.  CBC w/ Diff and CMP from 12/26/23 assessed, noted patient Scr of 1.30 mg/dL (CrCl ~31.0 mL/min) - no renal dose adjustments required at this time. Prescription dose and frequency assessed for appropriateness.  Current medication list in Epic reviewed, no relevant/significant DDIs with Lomustine  identified.  Evaluated chart and no patient barriers to medication adherence noted.   Prescription has been e-scribed to the [facility] for benefits analysis and approval.  Oral Oncology Clinic will continue to follow for insurance authorization, copayment issues, initial counseling and start date.  [name], PharmD, BCPS, BCOP Hematology/Oncology Clinical Pharmacist [phone] 01/03/2024 9:48 AM

## 2024-01-03 NOTE — Progress Notes (Signed)
 Oral Chemotherapy Pharmacist Encounter  Patient was counseled under telephone encounter from 01/03/24.  [name], PharmD, BCPS, BCOP Hematology/Oncology Clinical Pharmacist [facility] and [facility] Oral Chemotherapy Navigation Clinics [phone] 01/03/2024 11:22 AM

## 2024-01-04 ENCOUNTER — Other Ambulatory Visit (HOSPITAL_COMMUNITY): Payer: Self-pay

## 2024-01-04 ENCOUNTER — Other Ambulatory Visit: Payer: Self-pay

## 2024-01-04 MED ORDER — LOMUSTINE 40 MG PO CAPS
200.0000 mg | ORAL_CAPSULE | Freq: Once | ORAL | 0 refills | Status: DC
  Filled 2024-01-04: qty 5, 30d supply, fill #0

## 2024-01-04 NOTE — Progress Notes (Signed)
 LVM. Medication on order. Will come in on 8.7

## 2024-01-04 NOTE — Addendum Note (Signed)
 Addended by: GEOFFRY STABS F on: 01/04/2024 11:56 AM   Modules accepted: Orders

## 2024-01-04 NOTE — Telephone Encounter (Signed)
 Oral Oncology Pharmacist Encounter  Notified by pharmacy that Lomustine  100 mg capsules are on backorder with no known date when they will be back in stock. OK per Dr. Buckley to update capsule to 40 mg as this strength is currently available.   Prescription updated and resent to pharmacy for processing. Patient has been updated about capsule strength change and knows he will take lomustine  40mg  capsules, 5 capsules (200mg ) by mouth once daily, may take at bedtime and on an empty stomach to decrease nausea and vomiting.   [name], PharmD, BCPS, BCOP Hematology/Oncology Clinical Pharmacist [phone] 01/04/2024 8:41 AM

## 2024-01-05 ENCOUNTER — Encounter (HOSPITAL_COMMUNITY)
Admission: RE | Admit: 2024-01-05 | Discharge: 2024-01-05 | Disposition: A | Discharge status: Home / Self Care | Source: Ambulatory Visit | Attending: Internal Medicine | Admitting: Internal Medicine

## 2024-01-05 ENCOUNTER — Inpatient Hospital Stay: Attending: Internal Medicine

## 2024-01-05 ENCOUNTER — Inpatient Hospital Stay

## 2024-01-05 ENCOUNTER — Other Ambulatory Visit: Payer: Self-pay

## 2024-01-05 ENCOUNTER — Encounter: Payer: Self-pay | Admitting: Internal Medicine

## 2024-01-05 ENCOUNTER — Inpatient Hospital Stay (HOSPITAL_BASED_OUTPATIENT_CLINIC_OR_DEPARTMENT_OTHER): Admitting: Internal Medicine

## 2024-01-05 VITALS — BP 115/81 | HR 57 | Temp 97.5°F | Resp 20 | Wt 227.0 lb

## 2024-01-05 VITALS — BP 119/69 | HR 55 | Temp 97.9°F | Resp 18

## 2024-01-05 DIAGNOSIS — C719 Malignant neoplasm of brain, unspecified: Secondary | ICD-10-CM

## 2024-01-05 DIAGNOSIS — N400 Enlarged prostate without lower urinary tract symptoms: Secondary | ICD-10-CM | POA: Diagnosis not present

## 2024-01-05 DIAGNOSIS — E119 Type 2 diabetes mellitus without complications: Secondary | ICD-10-CM | POA: Insufficient documentation

## 2024-01-05 DIAGNOSIS — G4730 Sleep apnea, unspecified: Secondary | ICD-10-CM | POA: Diagnosis not present

## 2024-01-05 DIAGNOSIS — I10 Essential (primary) hypertension: Secondary | ICD-10-CM | POA: Diagnosis not present

## 2024-01-05 DIAGNOSIS — Z9221 Personal history of antineoplastic chemotherapy: Secondary | ICD-10-CM | POA: Diagnosis not present

## 2024-01-05 DIAGNOSIS — Z79899 Other long term (current) drug therapy: Secondary | ICD-10-CM | POA: Insufficient documentation

## 2024-01-05 DIAGNOSIS — Z87442 Personal history of urinary calculi: Secondary | ICD-10-CM | POA: Insufficient documentation

## 2024-01-05 DIAGNOSIS — Z809 Family history of malignant neoplasm, unspecified: Secondary | ICD-10-CM | POA: Insufficient documentation

## 2024-01-05 DIAGNOSIS — C711 Malignant neoplasm of frontal lobe: Secondary | ICD-10-CM | POA: Insufficient documentation

## 2024-01-05 DIAGNOSIS — Z483 Aftercare following surgery for neoplasm: Secondary | ICD-10-CM | POA: Insufficient documentation

## 2024-01-05 DIAGNOSIS — Z7984 Long term (current) use of oral hypoglycemic drugs: Secondary | ICD-10-CM | POA: Insufficient documentation

## 2024-01-05 DIAGNOSIS — Z5112 Encounter for antineoplastic immunotherapy: Secondary | ICD-10-CM | POA: Diagnosis present

## 2024-01-05 DIAGNOSIS — Z923 Personal history of irradiation: Secondary | ICD-10-CM | POA: Insufficient documentation

## 2024-01-05 DIAGNOSIS — Z87891 Personal history of nicotine dependence: Secondary | ICD-10-CM | POA: Insufficient documentation

## 2024-01-05 DIAGNOSIS — R011 Cardiac murmur, unspecified: Secondary | ICD-10-CM | POA: Insufficient documentation

## 2024-01-05 DIAGNOSIS — Z803 Family history of malignant neoplasm of breast: Secondary | ICD-10-CM | POA: Diagnosis not present

## 2024-01-05 DIAGNOSIS — E785 Hyperlipidemia, unspecified: Secondary | ICD-10-CM | POA: Insufficient documentation

## 2024-01-05 DIAGNOSIS — Z87440 Personal history of urinary (tract) infections: Secondary | ICD-10-CM | POA: Insufficient documentation

## 2024-01-05 DIAGNOSIS — R900 Intracranial space-occupying lesion found on diagnostic imaging of central nervous system: Secondary | ICD-10-CM | POA: Insufficient documentation

## 2024-01-05 LAB — CBC WITH DIFFERENTIAL (CANCER CENTER ONLY)
Abs Immature Granulocytes: 0.01 K/uL (ref 0.00–0.07)
Basophils Absolute: 0 K/uL (ref 0.0–0.1)
Basophils Relative: 0 %
Eosinophils Absolute: 0 K/uL (ref 0.0–0.5)
Eosinophils Relative: 0 %
HCT: 52.2 % — ABNORMAL HIGH (ref 39.0–52.0)
Hemoglobin: 17.5 g/dL — ABNORMAL HIGH (ref 13.0–17.0)
Immature Granulocytes: 0 %
Lymphocytes Relative: 27 %
Lymphs Abs: 1.5 K/uL (ref 0.7–4.0)
MCH: 25.9 pg — ABNORMAL LOW (ref 26.0–34.0)
MCHC: 33.5 g/dL (ref 30.0–36.0)
MCV: 77.3 fL — ABNORMAL LOW (ref 80.0–100.0)
Monocytes Absolute: 0.3 K/uL (ref 0.1–1.0)
Monocytes Relative: 6 %
Neutro Abs: 3.7 K/uL (ref 1.7–7.7)
Neutrophils Relative %: 67 %
Platelet Count: 194 K/uL (ref 150–400)
RBC: 6.75 MIL/uL — ABNORMAL HIGH (ref 4.22–5.81)
RDW: 18.2 % — ABNORMAL HIGH (ref 11.5–15.5)
WBC Count: 5.5 K/uL (ref 4.0–10.5)
nRBC: 0 % (ref 0.0–0.2)

## 2024-01-05 LAB — GLUCOSE, CAPILLARY: Glucose-Capillary: 122 mg/dL — ABNORMAL HIGH (ref 70–99)

## 2024-01-05 MED ORDER — SODIUM CHLORIDE 0.9 % IV SOLN
INTRAVENOUS | Status: DC
Start: 2024-01-05 — End: 2024-01-05

## 2024-01-05 MED ORDER — SODIUM CHLORIDE 0.9 % IV SOLN
10.0000 mg/kg | Freq: Once | INTRAVENOUS | Status: AC
  Administered 2024-01-05: 1100 mg via INTRAVENOUS
  Filled 2024-01-05: qty 32

## 2024-01-05 MED ORDER — FLUDEOXYGLUCOSE F - 18 (FDG) INJECTION
9.9500 | Freq: Once | INTRAVENOUS | Status: AC | PRN
  Administered 2024-01-05: 9.95 via INTRAVENOUS

## 2024-01-05 MED ORDER — SODIUM CHLORIDE 0.9% FLUSH
10.0000 mL | INTRAVENOUS | Status: DC | PRN
Start: 2024-01-05 — End: 2024-01-05

## 2024-01-05 NOTE — Patient Instructions (Signed)
 [facility] - A DEPT OF [facility]  Discharge Instructions: Thank you for choosing [facility] to provide your oncology and hematology care.   If you have a lab appointment with the Cancer Center, please go directly to the Cancer Center and check in at the registration area.   Wear comfortable clothing and clothing appropriate for easy access to any Portacath or PICC line.   We strive to give you quality time with your provider. You may need to reschedule your appointment if you arrive late (15 or more minutes).  Arriving late affects you and other patients whose appointments are after yours.  Also, if you miss three or more appointments without notifying the office, you may be dismissed from the clinic at the provider's discretion.      For prescription refill requests, have your pharmacy contact our office and allow 72 hours for refills to be completed.    Today you received the following chemotherapy and/or immunotherapy agent: Bevacizumab  (Avastin )    To help prevent nausea and vomiting after your treatment, we encourage you to take your nausea medication as directed.  BELOW ARE SYMPTOMS THAT SHOULD BE REPORTED IMMEDIATELY: *FEVER GREATER THAN 100.4 F (38 C) OR HIGHER *CHILLS OR SWEATING *NAUSEA AND VOMITING THAT IS NOT CONTROLLED WITH YOUR NAUSEA MEDICATION *UNUSUAL SHORTNESS OF BREATH *UNUSUAL BRUISING OR BLEEDING *URINARY PROBLEMS (pain or burning when urinating, or frequent urination) *BOWEL PROBLEMS (unusual diarrhea, constipation, pain near the anus) TENDERNESS IN MOUTH AND THROAT WITH OR WITHOUT PRESENCE OF ULCERS (sore throat, sores in mouth, or a toothache) UNUSUAL RASH, SWELLING OR PAIN  UNUSUAL VAGINAL DISCHARGE OR ITCHING   Items with * indicate a potential emergency and should be followed up as soon as possible or go to the Emergency Department if any problems should occur.  Please show the CHEMOTHERAPY ALERT CARD or  IMMUNOTHERAPY ALERT CARD at check-in to the Emergency Department and triage nurse.  Should you have questions after your visit or need to cancel or reschedule your appointment, please contact [facility] - A DEPT OF [facility]  Dept: [phone]  and follow the prompts.  Office hours are 8:00 a.m. to 4:30 p.m. Monday - Friday. Please note that voicemails left after 4:00 p.m. may not be returned until the following business day.  We are closed weekends and major holidays. You have access to a nurse at all times for urgent questions. Please call the main number to the clinic Dept: [phone] and follow the prompts.   For any non-urgent questions, you may also contact your provider using MyChart. We now offer e-Visits for anyone 3 and older to request care online for non-urgent symptoms. For details visit [email or web address].   Also download the MyChart app! Go to the app store, search MyChart, open the app, select [facility], and log in with your MyChart username and password.  Bevacizumab  Injection What is this medication? BEVACIZUMAB  (be va SIZ yoo mab) treats some types of cancer. It works by blocking a protein that causes cancer cells to grow and multiply. This helps to slow or stop the spread of cancer cells. It is a monoclonal antibody. This medicine may be used for other purposes; ask your health care provider or pharmacist if you have questions. COMMON BRAND NAME(S): Alymsys , Avastin , MVASI , Vegzalma, Zirabev  What should I tell my care team before I take this medication? They need to know if you have  any of these conditions: Blood clots Coughing up blood Having or recent surgery Heart failure High blood pressure History of a connection between 2 or more body parts that do not usually connect (fistula) History of a tear in your stomach or intestines Protein in your urine An unusual or allergic reaction to bevacizumab , other medications, foods,  dyes, or preservatives Pregnant or trying to get pregnant Breast-feeding How should I use this medication? This medication is injected into a vein. It is given by your care team in a hospital or clinic setting. Talk to your care team the use of this medication in children. Special care may be needed. Overdosage: If you think you have taken too much of this medicine contact a poison control center or emergency room at once. NOTE: This medicine is only for you. Do not share this medicine with others. What if I miss a dose? Keep appointments for follow-up doses. It is important not to miss your dose. Call your care team if you are unable to keep an appointment. What may interact with this medication? Interactions are not expected. This list may not describe all possible interactions. Give your health care provider a list of all the medicines, herbs, non-prescription drugs, or dietary supplements you use. Also tell them if you smoke, drink alcohol, or use illegal drugs. Some items may interact with your medicine. What should I watch for while using this medication? Your condition will be monitored carefully while you are receiving this medication. You may need blood work while taking this medication. This medication may make you feel generally unwell. This is not uncommon as chemotherapy can affect healthy cells as well as cancer cells. Report any side effects. Continue your course of treatment even though you feel ill unless your care team tells you to stop. This medication may increase your risk to bruise or bleed. Call your care team if you notice any unusual bleeding. Before having surgery, talk to your care team to make sure it is ok. This medication can increase the risk of poor healing of your surgical site or wound. You will need to stop this medication for 28 days before surgery. After surgery, wait at least 28 days before restarting this medication. Make sure the surgical site or wound is  healed enough before restarting this medication. Talk to your care team if questions. Talk to your care team if you may be pregnant. Serious birth defects can occur if you take this medication during pregnancy and for 6 months after the last dose. Contraception is recommended while taking this medication and for 6 months after the last dose. Your care team can help you find the option that works for you. Do not breastfeed while taking this medication and for 6 months after the last dose. This medication can cause infertility. Talk to your care team if you are concerned about your fertility. What side effects may I notice from receiving this medication? Side effects that you should report to your care team as soon as possible: Allergic reactions--skin rash, itching, hives, swelling of the face, lips, tongue, or throat Bleeding--bloody or black, tar-like stools, vomiting blood or brown material that looks like coffee grounds, red or dark brown urine, small red or purple spots on skin, unusual bruising or bleeding Blood clot--pain, swelling, or warmth in the leg, shortness of breath, chest pain Heart attack--pain or tightness in the chest, shoulders, arms, or jaw, nausea, shortness of breath, cold or clammy skin, feeling faint or lightheaded Heart failure--shortness of  breath, swelling of the ankles, feet, or hands, sudden weight gain, unusual weakness or fatigue Increase in blood pressure Infection--fever, chills, cough, sore throat, wounds that don't heal, pain or trouble when passing urine, general feeling of discomfort or being unwell Infusion reactions--chest pain, shortness of breath or trouble breathing, feeling faint or lightheaded Kidney injury--decrease in the amount of urine, swelling of the ankles, hands, or feet Stomach pain that is severe, does not go away, or gets worse Stroke--sudden numbness or weakness of the face, arm, or leg, trouble speaking, confusion, trouble walking, loss of  balance or coordination, dizziness, severe headache, change in vision Sudden and severe headache, confusion, change in vision, seizures, which may be signs of posterior reversible encephalopathy syndrome (PRES) Side effects that usually do not require medical attention (report to your care team if they continue or are bothersome): Back pain Change in taste Diarrhea Dry skin Increased tears Nosebleed This list may not describe all possible side effects. Call your doctor for medical advice about side effects. You may report side effects to FDA at 1-800-FDA-1088. Where should I keep my medication? This medication is given in a hospital or clinic. It will not be stored at home. NOTE: This sheet is a summary. It may not cover all possible information. If you have questions about this medicine, talk to your doctor, pharmacist, or health care provider.  2024 Elsevier/Gold Standard (2021-10-02 00:00:00)

## 2024-01-05 NOTE — Progress Notes (Signed)
 [facility] at [facility] [address]  [place], [place] [address] [phone]   Interval Evaluation  Date of Service: 01/05/24 Patient Name: Randall Lewis Patient MRN: [id number] Patient DOB: [birth year] Provider: Arthea MARLA Manns, MD  Identifying Statement:  Randall Lewis is a 68 y.o. male with right frontal glioblastoma    Oncologic History: Anaplastic oligodendroglioma of frontal lobe (CMS/HHS-HCC)  04/16/2005 Surgery  Craniotomy for resection of left frontal lesion. 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 Lost to follow up after 03/18/2010  05/02/2023 Presentation  Presentation to the [facility] at [facility]. Recommendations pending final pathology. Likely XRT/Temozolomide  as this is a high grade glioma. 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 Pathology reveals Infiltrating High Grade Glioma pending final molecular studies for integrated diagnosis.  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  Today he does reports overall stability of previously noted impaired vision on the left side, he has been unable to drive safely as a result of this.  He also reports recognizing faces in people that he doesn't know.  Otherwise his walking is independent.  No headaches or seizures.    H+P (05/19/23) Patient presents for follow up after craniotomy, resection of recurrent primary brain tumor at [facility] on 04/18/23.  He tolerated surgery well, no ill effects.  He is functionally intact, no further headaches, no seizures.  Recently increased blood pressure medication with his PCP.  Has visit with radiation oncology upcoming as well.  Medications: Current Outpatient Medications on File Prior to Visit  Medication Sig Dispense Refill   acetaminophen  (TYLENOL ) 325 MG tablet Take 975 mg by mouth every 6 (six) hours as needed.     amLODipine (NORVASC) 5 MG tablet Take 5 mg by mouth daily.     buPROPion (WELLBUTRIN SR) 100 MG 12 hr tablet Take 100 mg by mouth every morning.     JARDIANCE 25 MG TABS tablet Take 25 mg by mouth daily.     losartan-hydrochlorothiazide  (HYZAAR) 100-12.5 MG tablet Take 1 tablet by mouth daily.     metFORMIN (GLUCOPHAGE-XR) 500 MG 24 hr tablet Take 1,000 mg by  mouth daily.     metoprolol tartrate (LOPRESSOR) 12.5 mg TABS tablet Take  12.5 mg by mouth daily.     oxyCODONE  (OXY IR/ROXICODONE ) 5 MG immediate release tablet Take 5 mg by mouth every 4 (four) hours as needed.     pioglitazone (ACTOS) 15 MG tablet Take 15 mg by mouth daily.     predniSONE  (DELTASONE ) 50 MG tablet Take 1 tablet at 13 hours, 7 hours, and 1 hour prior to IV contrast 9 tablet 0   simvastatin (ZOCOR) 20 MG tablet Take 20 mg by mouth daily.     tamsulosin  (FLOMAX ) 0.4 MG CAPS capsule Take 0.4 mg by mouth daily.     lomustine  (GLEOSTINE ) 40 MG capsule Take 5 capsules (200 mg total) by mouth once for 1 dose. Take on an empty stomach 1 hour before or 2 hours after meals. (Patient not taking: Reported on 01/05/2024) 5 capsule 0   No current facility-administered medications on file prior to visit.    Allergies:  Allergies  Allergen Reactions   Sildenafil Other (See Comments) and Nausea Only   Gadolinium Derivatives Nausea And Vomiting    Per pt , always get sick with gado and never offered anti nausea meds.   Past Medical History:  Past Medical History:  Diagnosis Date   Anxiety    BPH (benign prostatic hyperplasia)    Cancer (HCC) 05/31/2004   Brain tumor, in remission after chemo radiation and surgery   Diabetes mellitus without complication (HCC)    ED (erectile dysfunction)    Heart murmur    History of heart murmur   Hyperlipidemia    Hypertension    Sleep apnea    USES C-PAP   UTI (urinary tract infection)    Past Surgical History:  Past Surgical History:  Procedure Laterality Date   BRAIN SURGERY  2006   BRAIN TUMOR  - CANCER   COLONOSCOPY     CYSTOSCOPY N/A 07/29/2015   Procedure: FLEXIBLE CYSTOSCOPY;  Surgeon: Norleen Seltzer, MD;  Location: [facility];  Service: Urology;  Laterality: N/A;   FOOT BONE EXCISION  1989   PENILE PROSTHESIS IMPLANT N/A 07/29/2015   Procedure: PENILE PROSTHESIS;  Surgeon: Norleen Seltzer, MD;  Location: [facility];  Service: Urology;  Laterality: N/A;   right knee meniscus repair  2010   THYROID  LOBECTOMY   05/12/2012   Procedure: THYROID  LOBECTOMY;  Surgeon: Krystal CHRISTELLA Spinner, MD;  Location: [facility];  Service: General;  Laterality: Left;  left thyroid  lobectomy   Social History:  Social History   Socioeconomic History   Marital status: Married    Spouse name: [name]   Number of children: 2   Years of education: Not on file   Highest education level: Master's degree (e.g., MA, MS, MEng, MEd, MSW, MBA)  Occupational History   Not on file  Tobacco Use   Smoking status: Former    Current packs/day: 0.00    Average packs/day: 1 pack/day for 23.0 years (23.0 ttl pk-yrs)    Types: Cigarettes    Start date: 04/26/1973    Quit date: 04/26/1996    Years since quitting: 27.7   Smokeless tobacco: Never  Vaping Use   Vaping status: Never Used  Substance and Sexual Activity   Alcohol use: No   Drug use: No   Sexual activity: Not on file  Other Topics Concern   Not on file  Social History Narrative   Lives with wife   Right handed   Caffeine: 2 cups  of coffee and 1 glass of tea/soda a day   Social Drivers of Corporate investment banker Strain: Low Risk  (05/01/2023)   Received from [facility]   Overall Financial Resource Strain (CARDIA)    Difficulty of Paying Living Expenses: Not hard at all  Food Insecurity: Low Risk  (11/01/2023)   Received from [facility]   Hunger Vital Sign    Within the past 12 months, you worried that your food would run out before you got money to buy more: Never true    Within the past 12 months, the food you bought just didn't last and you didn't have money to get more. : Never true  Transportation Needs: No Transportation Needs (11/01/2023)   Received from [facility]    In the past 12 months, has lack of reliable transportation kept you from medical appointments, meetings, work or from getting things needed for daily living? : No  Physical Activity: Not on file  Stress: Not on file  Social Connections: Not on file   Intimate Partner Violence: Not At Risk (05/20/2023)   Humiliation, Afraid, Rape, and Kick questionnaire    Fear of Current or Ex-Partner: No    Emotionally Abused: No    Physically Abused: No    Sexually Abused: No   Family History:  Family History  Problem Relation Age of Onset   Breast cancer Mother    Cancer Brother 36   Heart attack Brother    Heart attack Brother    Birth defects Son    Colon cancer Neg Hx    Liver disease Neg Hx    Esophageal cancer Neg Hx     Review of Systems: Constitutional: Doesn't report fevers, chills or abnormal weight loss Eyes: Doesn't report blurriness of vision Ears, nose, mouth, throat, and face: Doesn't report sore throat Respiratory: Doesn't report cough, dyspnea or wheezes Cardiovascular: Doesn't report palpitation, chest discomfort  Gastrointestinal:  Doesn't report nausea, constipation, diarrhea GU: Doesn't report incontinence Skin: Doesn't report skin rashes Neurological: Per HPI Musculoskeletal: Doesn't report joint pain Behavioral/Psych: Doesn't report anxiety  Physical Exam: Vitals:   01/05/24 1412  BP: 115/81  Pulse: (!) 57  Resp: 20  Temp: (!) 97.5 F (36.4 C)  SpO2: 98%    KPS: 70 General: Alert, cooperative, pleasant, in no acute distress Head: Normal EENT: No conjunctival injection or scleral icterus.  Lungs: Resp effort normal Cardiac: Regular rate Abdomen: Non-distended abdomen Skin: No rashes cyanosis or petechiae. Extremities: No clubbing or edema  Neurologic Exam: Mental Status: Awake, alert, attentive to examiner. Oriented to self and environment. Language is fluent with intact comprehension.  Cranial Nerves: Visual acuity is grossly normal. Left hemianopia. Extra-ocular movements intact. No ptosis. Face is symmetric Motor: Tone and bulk are normal. Power is full in both arms and legs. Reflexes are symmetric, no pathologic reflexes present.  Sensory: Intact to light touch Gait: Normal.   Labs: I  have reviewed the data as listed    Component Value Date/Time   NA 138 12/26/2023 1109   K 4.2 12/26/2023 1109   CL 104 12/26/2023 1109   CO2 29 12/26/2023 1109   GLUCOSE 159 (H) 12/26/2023 1109   BUN 20 12/26/2023 1109   CREATININE 1.30 (H) 12/26/2023 1109   CREATININE 1.31 (H) 02/25/2012 1438   CALCIUM 9.0 12/26/2023 1109   PROT 6.8 12/26/2023 1109   ALBUMIN 4.0 12/26/2023 1109   AST 13 (L) 12/26/2023 1109   ALT 17  12/26/2023 1109   ALKPHOS 48 12/26/2023 1109   BILITOT 0.7 12/26/2023 1109   GFRNONAA >60 12/26/2023 1109   GFRNONAA >60 02/25/2012 1438   GFRAA >60 08/14/2019 1438   GFRAA >60 02/25/2012 1438   Lab Results  Component Value Date   WBC 5.5 01/05/2024   NEUTROABS 3.7 01/05/2024   HGB 17.5 (H) 01/05/2024   HCT 52.2 (H) 01/05/2024   MCV 77.3 (L) 01/05/2024   PLT 194 01/05/2024   Imaging:  [facility] Clinician Interpretation: I have personally reviewed the CNS images as listed.  My interpretation, in the context of the patient's clinical presentation, is progressive disease  MR BRAIN W WO CONTRAST Result Date: 12/22/2023 CLINICAL DATA:  Brain/CNS neoplasm, assess treatment response EXAM: MRI HEAD WITHOUT AND WITH CONTRAST TECHNIQUE: Multiplanar, multiecho pulse sequences of the brain and surrounding structures were obtained without and with intravenous contrast. CONTRAST:  10mL GADAVIST  GADOBUTROL  1 MMOL/ML IV SOLN COMPARISON:  MRI of the head dated Oct 07, 2023. FINDINGS: Brain: A centrally necrotic peripherally enhancing lesion within the medial aspect of the right occipital lobe as increased in size in the interim from 20 x 20 x 10 mm to approximately 45 x 29 x 28 mm. There is also more extensive T2 signal hyperintensity within the surrounding occipital, parietal and temporal lobes. There is a new band of restricted diffusion along the medial ependymal surface of the posterior horn of the right lateral ventricle. There is more hemosiderin staining within the peripherally  enhancing lesion. There continue to be numerous foci of hemosiderin staining within the frontal lobes bilaterally, particularly on the left. There are chronic encephalomalacia changes also again demonstrated within the temporal lobe from prior resection. There is no residual tumor evident at the frontal lobe resection site. Vascular: Normal flow voids. Skull and upper cervical spine: Status post right temporoparietal craniotomy and left pterional craniotomy. Sinuses/Orbits: Clear paranasal sinuses.  Normal orbits. Other: None. IMPRESSION: 1. There is been significant interval enlargement of a peripherally enhancing, centrally necrotic lesion within the right medial occipital lobe. There is evidence of intralesional hemorrhage and worsening surrounding T2 signal hyperintensity. There is also new restricted diffusion along the medial ependymal surface of the posterior horn of the right lateral ventricle. 2. Stable left frontal lobe surgical encephalomalacia changes. Electronically Signed   By: Evalene Coho M.D.   On: 12/22/2023 11:49   Assessment/Plan Glioblastoma, IDH-wildtype (HCC)  Randall Lewis is clinically stable today, here for initiation of cycle #1 CCNU/avastin  given recent radiographic progression.    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 MRI brain demonstrates marked progression of disease within right occipital lobe, secondary to primary site of disease/resection.  This region was within the high dose radiation field, but degree and timing of changes are most concerning for progression of disease.  Post-RT inflamattory changes are also possible, especially given very rapid rate of growth.    We recommended discontinuing continuing treatment with Temozolomide .  We discussed options for second line therapy, including CCNU/avastin  and clinical trial referral.     Patient elected to proceed with cycle #1 oral CCNU 90mg /m2 q6 weeks and avastin  10mg /kg  IV 2q weeks.  Avastin  will be helpful as concurrent therapy given burden of enhancing tumor and steroid requirement.  We reviewed side effects of CCNU, including fatigue, nausea vomiting, cytopenias, ILD.  We reviewed side effects of avastin , including hypertension, bleeding/clotting events, wound healing impairment.  The patient will have a complete blood count, a comprehensive metabolic panel,  and urine protein performed prior to each avastin  infusion. Labs may need to be performed more often. Zofran  will prescribed for home use for nausea/vomiting.    Informed consent was obtained verbally at bedside to proceed with oral chemotherapy and avastin .  Chemotherapy should be held for the following:  ANC less than 1,000  Platelets less than 100,000  LFT or creatinine greater than 2x ULN  If clinical concerns/contraindications develop   Avastin  should be held for the following:  ANC less than 500  Platelets less than 50,000  LFT or creatinine greater than 2x ULN  If clinical concerns/contraindications develop  We ask that Randall Lewis return to clinic in 2 weeks with labs for avastin  infusion, or sooner as needed.  All questions were answered. The patient knows to call the clinic with any problems, questions or concerns. No barriers to learning were detected.  The total time spent in the encounter was 30 minutes and more than 50% was on counseling and review of test results   Arthea MARLA Manns, MD Medical Director of Neuro-Oncology [facility] [place] 01/05/24 2:35 PM

## 2024-01-06 ENCOUNTER — Telehealth: Payer: Self-pay

## 2024-01-06 NOTE — Telephone Encounter (Signed)
 Randall Lewis states that he is doing fine. He is eating, drinking, and urinating well. He knows to call the office at [phone] if he has any questions or concerns.

## 2024-01-06 NOTE — Telephone Encounter (Signed)
-----   Message from Nurse Odella HERO sent at 01/05/2024  5:20 PM EDT ----- Regarding: Dr. Buckley Pt. first time Bevacizumab . Tolerated well no issues noted. Thank you!

## 2024-01-09 ENCOUNTER — Inpatient Hospital Stay

## 2024-01-15 ENCOUNTER — Emergency Department (HOSPITAL_COMMUNITY)

## 2024-01-15 ENCOUNTER — Other Ambulatory Visit: Payer: Self-pay

## 2024-01-15 ENCOUNTER — Observation Stay (HOSPITAL_COMMUNITY)
Admission: EM | Admit: 2024-01-15 | Discharge: 2024-01-16 | Disposition: A | Discharge status: Home / Self Care | Attending: Internal Medicine | Admitting: Internal Medicine

## 2024-01-15 DIAGNOSIS — N400 Enlarged prostate without lower urinary tract symptoms: Secondary | ICD-10-CM | POA: Insufficient documentation

## 2024-01-15 DIAGNOSIS — N179 Acute kidney failure, unspecified: Secondary | ICD-10-CM | POA: Insufficient documentation

## 2024-01-15 DIAGNOSIS — R569 Unspecified convulsions: Principal | ICD-10-CM | POA: Insufficient documentation

## 2024-01-15 DIAGNOSIS — C719 Malignant neoplasm of brain, unspecified: Secondary | ICD-10-CM | POA: Diagnosis not present

## 2024-01-15 DIAGNOSIS — G4733 Obstructive sleep apnea (adult) (pediatric): Secondary | ICD-10-CM | POA: Insufficient documentation

## 2024-01-15 DIAGNOSIS — F32A Depression, unspecified: Secondary | ICD-10-CM | POA: Diagnosis not present

## 2024-01-15 DIAGNOSIS — Z6831 Body mass index (BMI) 31.0-31.9, adult: Secondary | ICD-10-CM | POA: Diagnosis not present

## 2024-01-15 DIAGNOSIS — I10 Essential (primary) hypertension: Secondary | ICD-10-CM | POA: Diagnosis not present

## 2024-01-15 DIAGNOSIS — R2689 Other abnormalities of gait and mobility: Secondary | ICD-10-CM | POA: Diagnosis not present

## 2024-01-15 DIAGNOSIS — E66811 Obesity, class 1: Secondary | ICD-10-CM | POA: Insufficient documentation

## 2024-01-15 DIAGNOSIS — G40901 Epilepsy, unspecified, not intractable, with status epilepticus: Principal | ICD-10-CM

## 2024-01-15 DIAGNOSIS — E8721 Acute metabolic acidosis: Secondary | ICD-10-CM

## 2024-01-15 DIAGNOSIS — E119 Type 2 diabetes mellitus without complications: Secondary | ICD-10-CM | POA: Diagnosis not present

## 2024-01-15 DIAGNOSIS — E871 Hypo-osmolality and hyponatremia: Secondary | ICD-10-CM

## 2024-01-15 LAB — CBG MONITORING, ED: Glucose-Capillary: 163 mg/dL — ABNORMAL HIGH (ref 70–99)

## 2024-01-15 MED ORDER — LORAZEPAM 2 MG/ML IJ SOLN
2.0000 mg | Freq: Once | INTRAMUSCULAR | Status: AC
  Administered 2024-01-15: 2 mg via INTRAVENOUS
  Filled 2024-01-15: qty 1

## 2024-01-15 MED ORDER — LORAZEPAM 2 MG/ML IJ SOLN
INTRAMUSCULAR | Status: AC
  Filled 2024-01-15: qty 1

## 2024-01-15 MED ORDER — LEVETIRACETAM (KEPPRA) 500 MG/5 ML ADULT IV PUSH
4500.0000 mg | Freq: Once | INTRAVENOUS | Status: AC
  Administered 2024-01-15: 4500 mg via INTRAVENOUS
  Filled 2024-01-15: qty 45

## 2024-01-15 NOTE — ED Triage Notes (Signed)
 Pt bib [facility] from home where he lives with his wife. Wife states that he had a headache that started tonight and took tylenol  to try to help with the pain. Pt started having left side weakness with the last known well being 2115.  CODE STROKE activated and on arrival to ED pt was found to be seizing. Pt given 2mg  Ativan  IV  and taken to CT

## 2024-01-15 NOTE — ED Provider Notes (Signed)
 [place] EMERGENC[facility] DE[facility] AT [facility] Provider Note   CSN: [id number] Arrival date & time: 01/15/24  2255  An emergency department physician performed an initial assessment on this suspected stroke patient at 2255.  Patient presents with: chief complaint - weakness   Level 5 caveat due to acuity of condition [place] [facility] is a 68 y.o. male.   The history is provided by the EMS personnel.  Patient with a history of oligodendroglioma, s/p craniotomy presents initially as a code stroke.  Last known well at approximately 2115, patient had onset of left-sided weakness.  Patient was brought in by EMS, and route was noted to have left-sided focal seizures. Patient was taken immediately to the CT scanner for evaluation    Past Medical History:  Diagnosis Date   Anxiety    BPH (benign prostatic hyperplasia)    Cancer (HCC) 05/31/2004   Brain tumor, in remission after chemo radiation and surgery   Diabetes mellitus without complication (HCC)    ED (erectile dysfunction)    Heart murmur    History of heart murmur   Hyperlipidemia    Hypertension    Sleep apnea    USES C-PAP   UTI (urinary tract infection)     Prior to Admission medications   Medication Sig Start Date End Date Taking? Authorizing Provider  acetaminophen  (TYLENOL ) 325 MG tablet Take 975 mg by mouth every 6 (six) hours as needed. 04/19/23   [provider]  amLODipine  (NORVASC ) 5 MG tablet Take 5 mg by mouth daily.    [provider]  buPROPion  (WELLBUTRIN  SR) 100 MG 12 hr tablet Take 100 mg by mouth every morning. 11/29/19   [provider]  JARDIANCE 25 MG TABS tablet Take 25 mg by mouth daily. 05/23/23   [provider]  lomustine  (GLEOSTINE ) 40 MG capsule Take 5 capsules (200 mg total) by mouth once for 1 dose. Take on an empty stomach 1 hour before or 2 hours after meals. Patient not taking: Reported on 01/05/2024 01/04/24 01/06/24  Vaslow, Zachary K, MD   losartan -hydrochlorothiazide  (HYZAAR) 100-12.5 MG tablet Take 1 tablet by mouth daily.    [provider]  metFORMIN (GLUCOPHAGE-XR) 500 MG 24 hr tablet Take 1,000 mg by mouth daily. 01/17/23   [provider]  metoprolol  tartrate (LOPRESSOR ) 12.5 mg TABS tablet Take 12.5 mg by mouth daily.    [provider]  oxyCODONE  (OXY IR/ROXICODONE ) 5 MG immediate release tablet Take 5 mg by mouth every 4 (four) hours as needed. 04/19/23   [provider]  pioglitazone (ACTOS) 15 MG tablet Take 15 mg by mouth daily. 11/26/19   [provider]  predniSONE  (DELTASONE ) 50 MG tablet Take 1 tablet at 13 hours, 7 hours, and 1 hour prior to IV contrast 09/20/23   Vaslow, Zachary K, MD  simvastatin  (ZOCOR ) 20 MG tablet Take 20 mg by mouth daily. 05/16/15   [provider]  tamsulosin  (FLOMAX ) 0.4 MG CAPS capsule Take 0.4 mg by mouth daily. 05/04/23   [provider]    Allergies: Sildenafil and Gadolinium derivatives    Review of Systems  Unable to perform ROS: Acuity of condition    Updated Vital Signs BP (!) 150/75 (BP Location: Right Arm)   Pulse 68   Temp 97.7 F (36.5 C) (Temporal)   Resp (!) 22   Wt 104.3 kg   SpO2 95%   BMI 32.07 kg/m   Physical Exam CONSTITUTIONAL: Ill-appearing HEAD: Previous craniotomy scar noted,  no other signs of trauma EYES: EOMI/PERRL, no nystagmus ENMT: Mucous membranes moist NECK: supple no meningeal signs CV: S1/S2 noted, no murmurs/rubs/gallops noted LUNGS: Lungs are clear to auscultation bilaterally, no apparent distress ABDOMEN: soft, nontender NEURO: Pt is awake and alert, no facial droop. Mild tremor noted in the left upper and left lower extremity EXTREMITIES: pulses normal/equalx4, full ROM SKIN: warm, color normal  (all labs ordered are listed, but only abnormal results are displayed) Labs Reviewed  CBC - Abnormal; Notable for the following components:      Result Value   RBC 6.20 (*)     RDW 16.9 (*)    All other components within normal limits  DIFFERENTIAL - Abnormal; Notable for the following components:   Lymphs Abs 0.5 (*)    All other components within normal limits  I-STAT CHEM 8, ED - Abnormal; Notable for the following components:   Glucose, Bld 142 (*)    Calcium, Ion 1.02 (*)    TCO2 20 (*)    All other components within normal limits  CBG MONITORING, ED - Abnormal; Notable for the following components:   Glucose-Capillary 163 (*)    All other components within normal limits  PROTIME-INR  APTT  ETHANOL  COMPREHENSIVE METABOLIC PANEL WITH GFR  RAPID URINE DRUG SCREEN, HOSP PERFORMED    EKG: ED ECG REPORT   Date: 01/15/2024 2321  Rate: 63  Rhythm: normal sinus rhythm  QRS Axis: normal  Intervals: normal  ST/T Wave abnormalities: normal  Conduction Disutrbances:none  Narrative Interpretation:   Old EKG Reviewed: unchanged  I have personally reviewed the EKG tracing and agree with the computerized printout as noted.   Radiology: CT HEAD CODE STROKE WO CONTRAST Result Date: 01/15/2024 EXAM: CT HEAD WITHOUT CONTRAST 01/15/2024 11:09:42 PM TECHNIQUE: CT of the head was performed without the administration of intravenous contrast. Automated exposure control, iterative reconstruction, and/or weight based adjustment of the mA/kV was utilized to reduce the radiation dose to as low as reasonably achievable. COMPARISON: 04/02/2023 and 12/22/2023 CLINICAL HISTORY: Neuro deficit, acute, stroke suspected. FINDINGS: BRAIN AND VENTRICLES: Subdural hematoma of 8 mm thickness underlying the right parietal craniotomy site. Allowing for differences in technique this appears unchanged compared to the MRI of 12/22/2023. Remote left-sided craniotomy. There is diffuse white matter hypoattenuation throughout the posterior right hemisphere at the area of tumor resection. No midline shift. No hydrocephalus. Chronic gliosis in the left frontal white matter is unchanged. Improved  mass effect on the right lateral ventricle. ORBITS: No acute abnormality. SINUSES: No acute abnormality. SOFT TISSUES AND SKULL: No acute soft tissue abnormality. No skull fracture. IMPRESSION: 1. Allowing for modality difference, subdural hematoma of 8 mm thickness underlying the right parietal craniotomy site is unchanged compared to the MRI of 12/22/23. 2. Diffuse white matter hypoattenuation throughout the posterior right hemisphere at the area of tumor resection, with improved mass effect on the right lateral ventricle. No midline shift or hydrocephalus. 3. Chronic gliosis in the left frontal white matter, unchanged. 4. Findings communicated to Dr. [name] at 11:19 pm on 01/15/24. Electronically signed by: Franky Stanford MD 01/15/2024 11:20 PM EDT RP Workstation: [id number]     .Critical Care  Performed by: Midge Golas, MD Authorized by: Midge Golas, MD   Critical care provider statement:    Critical care time (minutes):  40   Critical care start time:  01/15/2024 11:20 PM   Critical care end time:  01/16/2024 12:00 AM   Critical care time was exclusive of:  Separately billable procedures and treating other patients   Critical care was necessary to treat or prevent imminent or life-threatening deterioration of the following conditions:  CNS failure or compromise   Critical care was time spent personally by me on the following activities:  Obtaining history from patient or surrogate, examination of patient, development of treatment plan with patient or surrogate, discussions with consultants, evaluation of patient's response to treatment, ordering and review of radiographic studies, ordering and review of laboratory studies, pulse oximetry, re-evaluation of patient's condition, review of old charts and ordering and performing treatments and interventions   I assumed direction of critical care for this patient from another provider in my specialty: no     Care discussed with: admitting  provider      Medications Ordered in the ED  LORazepam  (ATIVAN ) injection 2 mg (2 mg Intravenous Given 01/15/24 2300)  levETIRAcetam  (KEPPRA ) undiluted injection 4,500 mg (4,500 mg Intravenous Given 01/15/24 2329)    Clinical Course as of 01/16/24 0037  Sun Jan 15, 2024  2321 Patient seen soon after arrival for code stroke.  Due to seizure activity, patient will not receive thrombolytic Patient has been given Ativan  and will load with Keppra . Patient will be admitted to medical service [DW]  Mon Jan 16, 2024  0007 Patient resting comfortably after receiving medications.  Wife is at bedside.  She reports he had left-sided weakness that started having tremors.  He has never had seizures before  No new seizures are noted.  Patient can move all extremities except limited range of motion of his left leg at this time [DW]    Clinical Course User Index [DW] Midge Golas, MD                                 Medical Decision Making Amount and/or Complexity of Data Reviewed Labs: ordered. Radiology: ordered.  Risk Decision regarding hospitalization.   This patient presents to the ED for concern of weakness, this involves an extensive number of treatment options, and is a complaint that carries with it a high risk of complications and morbidity.  The differential diagnosis includes but is not limited to CVA, intracranial hemorrhage, acute coronary syndrome, renal failure, urinary tract infection, electrolyte disturbance, pneumonia, status epilepticus    Comorbidities that complicate the patient evaluation: Patient's presentation is complicated by their history of oligodendroglioma  Social Determinants of Health: Patient's former tobacco use  increases the complexity of managing their presentation  Additional history obtained: Additional history obtained from EMS  Records reviewed oncology notes reviewed  Lab Tests: I Ordered, and personally interpreted labs.  The pertinent results  include: Mild hyperglycemia  Imaging Studies ordered: I ordered imaging studies including CT scan head  I independently visualized and interpreted imaging which showed chronic subdural hematoma I agree with the radiologist interpretation  Medicines ordered and prescription drug management: I ordered medication including Keppra  for seizures Reevaluation of the patient after these medicines showed that the patient    improved   Critical Interventions:   Keppra , admission  Consultations Obtained: I requested consultation with the admitting physician Dr. Franky and consultant neurology Dr. Vanessa, and discussed  findings as well as pertinent plan - they recommend: admit  Reevaluation: After the interventions noted above, I reevaluated the patient and found that they have :improved  Complexity of problems addressed: Patient's presentation is most consistent with  acute presentation with potential threat to life or bodily  function  Disposition: After consideration of the diagnostic results and the patient's response to treatment,  I feel that the patent would benefit from admission  .        Final diagnoses:  Status epilepticus [facility])    ED Discharge Orders     None          Midge Golas, MD 01/16/24 [phone]

## 2024-01-15 NOTE — Code Documentation (Signed)
 Responded to Code Stroke called at 2245 for L sided weakness, [id number]. Pt arrived at 2255 having a localized L sided seizure with twitching of L face/arm/leg and L sided gaze. Pt given 2mg  ativan  IV at 2300. CBG-163, NIH-12, CT head-no acute changes. TNK not given-seizure, hx SDH. Plan: Seizure management. Please complete VS/neuro checks(including NIH) q2h x 12h, then q4h.

## 2024-01-16 ENCOUNTER — Encounter: Payer: Self-pay | Admitting: Internal Medicine

## 2024-01-16 ENCOUNTER — Observation Stay (HOSPITAL_COMMUNITY)

## 2024-01-16 ENCOUNTER — Other Ambulatory Visit (HOSPITAL_COMMUNITY): Payer: Self-pay

## 2024-01-16 ENCOUNTER — Encounter (HOSPITAL_COMMUNITY): Payer: Self-pay | Admitting: Internal Medicine

## 2024-01-16 DIAGNOSIS — C719 Malignant neoplasm of brain, unspecified: Secondary | ICD-10-CM

## 2024-01-16 DIAGNOSIS — F32A Depression, unspecified: Secondary | ICD-10-CM

## 2024-01-16 DIAGNOSIS — C714 Malignant neoplasm of occipital lobe: Secondary | ICD-10-CM | POA: Diagnosis not present

## 2024-01-16 DIAGNOSIS — I10 Essential (primary) hypertension: Secondary | ICD-10-CM | POA: Diagnosis not present

## 2024-01-16 DIAGNOSIS — E66811 Obesity, class 1: Secondary | ICD-10-CM | POA: Insufficient documentation

## 2024-01-16 DIAGNOSIS — R569 Unspecified convulsions: Secondary | ICD-10-CM | POA: Diagnosis not present

## 2024-01-16 DIAGNOSIS — E871 Hypo-osmolality and hyponatremia: Secondary | ICD-10-CM | POA: Diagnosis not present

## 2024-01-16 DIAGNOSIS — E119 Type 2 diabetes mellitus without complications: Secondary | ICD-10-CM

## 2024-01-16 DIAGNOSIS — E8721 Acute metabolic acidosis: Secondary | ICD-10-CM

## 2024-01-16 LAB — ETHANOL: Alcohol, Ethyl (B): <15 mg/dL (ref <15)

## 2024-01-16 LAB — DIFFERENTIAL
Abs Immature Granulocytes: 0.02 K/uL (ref 0.00–0.07)
Basophils Absolute: 0 K/uL (ref 0.0–0.1)
Basophils Relative: 0 %
Eosinophils Absolute: 0 K/uL (ref 0.0–0.5)
Eosinophils Relative: 0 %
Immature Granulocytes: 0 %
Lymphocytes Relative: 8 %
Lymphs Abs: 0.5 K/uL — ABNORMAL LOW (ref 0.7–4.0)
Monocytes Absolute: 0.3 K/uL (ref 0.1–1.0)
Monocytes Relative: 5 %
Neutro Abs: 5.1 K/uL (ref 1.7–7.7)
Neutrophils Relative %: 87 %

## 2024-01-16 LAB — COMPREHENSIVE METABOLIC PANEL WITH GFR
ALT: 14 U/L (ref 0–44)
ALT: 15 U/L (ref 0–44)
AST: 18 U/L (ref 15–41)
AST: 24 U/L (ref 15–41)
Albumin: 3.2 g/dL — ABNORMAL LOW (ref 3.5–5.0)
Albumin: 3.3 g/dL — ABNORMAL LOW (ref 3.5–5.0)
Alkaline Phosphatase: 42 U/L (ref 38–126)
Alkaline Phosphatase: 46 U/L (ref 38–126)
Anion gap: 16 — ABNORMAL HIGH (ref 5–15)
Anion gap: 8 (ref 5–15)
BUN: 14 mg/dL (ref 8–23)
BUN: 15 mg/dL (ref 8–23)
CO2: 16 mmol/L — ABNORMAL LOW (ref 22–32)
CO2: 23 mmol/L (ref 22–32)
Calcium: 8.6 mg/dL — ABNORMAL LOW (ref 8.9–10.3)
Calcium: 8.7 mg/dL — ABNORMAL LOW (ref 8.9–10.3)
Chloride: 102 mmol/L (ref 98–111)
Chloride: 104 mmol/L (ref 98–111)
Creatinine, Ser: 1.17 mg/dL (ref 0.61–1.24)
Creatinine, Ser: 1.32 mg/dL — ABNORMAL HIGH (ref 0.61–1.24)
GFR, Estimated: 59 mL/min — ABNORMAL LOW (ref >60)
GFR, Estimated: >60 mL/min (ref >60)
Glucose, Bld: 115 mg/dL — ABNORMAL HIGH (ref 70–99)
Glucose, Bld: 146 mg/dL — ABNORMAL HIGH (ref 70–99)
Potassium: 4.1 mmol/L (ref 3.5–5.1)
Potassium: 4.9 mmol/L (ref 3.5–5.1)
Sodium: 134 mmol/L — ABNORMAL LOW (ref 135–145)
Sodium: 135 mmol/L (ref 135–145)
Total Bilirubin: 0.8 mg/dL (ref 0.0–1.2)
Total Bilirubin: 0.9 mg/dL (ref 0.0–1.2)
Total Protein: 6 g/dL — ABNORMAL LOW (ref 6.5–8.1)
Total Protein: 6.2 g/dL — ABNORMAL LOW (ref 6.5–8.1)

## 2024-01-16 LAB — PROTIME-INR
INR: 1 (ref 0.8–1.2)
Prothrombin Time: 13.4 s (ref 11.4–15.2)

## 2024-01-16 LAB — CBG MONITORING, ED
Glucose-Capillary: 94 mg/dL (ref 70–99)
Glucose-Capillary: 109 mg/dL — ABNORMAL HIGH (ref 70–99)

## 2024-01-16 LAB — CBC
HCT: 49.8 % (ref 39.0–52.0)
HCT: 50.2 % (ref 39.0–52.0)
Hemoglobin: 16 g/dL (ref 13.0–17.0)
Hemoglobin: 16.3 g/dL (ref 13.0–17.0)
MCH: 26.1 pg (ref 26.0–34.0)
MCH: 26.3 pg (ref 26.0–34.0)
MCHC: 32.1 g/dL (ref 30.0–36.0)
MCHC: 32.5 g/dL (ref 30.0–36.0)
MCV: 81 fL (ref 80.0–100.0)
MCV: 81.2 fL (ref 80.0–100.0)
Platelets: 193 K/uL (ref 150–400)
Platelets: 195 K/uL (ref 150–400)
RBC: 6.13 MIL/uL — ABNORMAL HIGH (ref 4.22–5.81)
RBC: 6.2 MIL/uL — ABNORMAL HIGH (ref 4.22–5.81)
RDW: 16.9 % — ABNORMAL HIGH (ref 11.5–15.5)
RDW: 17.3 % — ABNORMAL HIGH (ref 11.5–15.5)
WBC: 5.9 K/uL (ref 4.0–10.5)
WBC: 6.9 K/uL (ref 4.0–10.5)
nRBC: 0 % (ref 0.0–0.2)
nRBC: 0 % (ref 0.0–0.2)

## 2024-01-16 LAB — RAPID URINE DRUG SCREEN, HOSP PERFORMED
Amphetamines: NOT DETECTED
Barbiturates: NOT DETECTED
Benzodiazepines: NOT DETECTED
Cocaine: NOT DETECTED
Opiates: NOT DETECTED
Tetrahydrocannabinol: NOT DETECTED

## 2024-01-16 LAB — I-STAT CHEM 8, ED
BUN: 19 mg/dL (ref 8–23)
Calcium, Ion: 1.02 mmol/L — ABNORMAL LOW (ref 1.15–1.40)
Chloride: 103 mmol/L (ref 98–111)
Creatinine, Ser: 1.1 mg/dL (ref 0.61–1.24)
Glucose, Bld: 142 mg/dL — ABNORMAL HIGH (ref 70–99)
HCT: 50 % (ref 39.0–52.0)
Hemoglobin: 17 g/dL (ref 13.0–17.0)
Potassium: 4.4 mmol/L (ref 3.5–5.1)
Sodium: 137 mmol/L (ref 135–145)
TCO2: 20 mmol/L — ABNORMAL LOW (ref 22–32)

## 2024-01-16 LAB — HEMOGLOBIN A1C
Hgb A1c MFr Bld: 6.6 % — ABNORMAL HIGH (ref 4.8–5.6)
Mean Plasma Glucose: 142.72 mg/dL

## 2024-01-16 LAB — APTT: aPTT: 24 s (ref 24–36)

## 2024-01-16 LAB — GLUCOSE, CAPILLARY: Glucose-Capillary: 127 mg/dL — ABNORMAL HIGH (ref 70–99)

## 2024-01-16 LAB — HIV ANTIBODY (ROUTINE TESTING W REFLEX): HIV Screen 4th Generation wRfx: NONREACTIVE

## 2024-01-16 MED ORDER — LEVETIRACETAM 250 MG PO TABS
1000.0000 mg | ORAL_TABLET | Freq: Two times a day (BID) | ORAL | Status: DC
  Administered 2024-01-16: 1000 mg via ORAL
  Filled 2024-01-16 (×2): qty 2

## 2024-01-16 MED ORDER — ACETAMINOPHEN 325 MG PO TABS: 650.0000 mg | ORAL_TABLET | Freq: Four times a day (QID) | ORAL | Status: DC | PRN

## 2024-01-16 MED ORDER — LOSARTAN POTASSIUM 50 MG PO TABS
100.0000 mg | ORAL_TABLET | Freq: Every day | ORAL | Status: DC
  Administered 2024-01-16: 100 mg via ORAL
  Filled 2024-01-16: qty 2

## 2024-01-16 MED ORDER — TAMSULOSIN HCL 0.4 MG PO CAPS
0.4000 mg | ORAL_CAPSULE | Freq: Every day | ORAL | Status: DC
  Administered 2024-01-16: 0.4 mg via ORAL
  Filled 2024-01-16: qty 1

## 2024-01-16 MED ORDER — LEVETIRACETAM (KEPPRA) 500 MG/5 ML ADULT IV PUSH: 1000.0000 mg | Freq: Two times a day (BID) | INTRAVENOUS | Status: DC

## 2024-01-16 MED ORDER — METOPROLOL TARTRATE 12.5 MG HALF TABLET
12.5000 mg | ORAL_TABLET | Freq: Every day | ORAL | Status: DC
  Administered 2024-01-16: 12.5 mg via ORAL
  Filled 2024-01-16: qty 1

## 2024-01-16 MED ORDER — AMLODIPINE BESYLATE 5 MG PO TABS
5.0000 mg | ORAL_TABLET | Freq: Every day | ORAL | Status: DC
  Administered 2024-01-16: 5 mg via ORAL
  Filled 2024-01-16: qty 1

## 2024-01-16 MED ORDER — CHLORHEXIDINE GLUCONATE CLOTH 2 % EX PADS
6.0000 | MEDICATED_PAD | Freq: Every day | CUTANEOUS | Status: DC
  Administered 2024-01-16: 6 via TOPICAL

## 2024-01-16 MED ORDER — INSULIN ASPART 100 UNIT/ML IJ SOLN: 0.0000 [IU] | Freq: Three times a day (TID) | INTRAMUSCULAR | Status: DC

## 2024-01-16 MED ORDER — BUPROPION HCL ER (SR) 100 MG PO TB12: 100.0000 mg | ORAL_TABLET | Freq: Every morning | ORAL | Status: DC

## 2024-01-16 MED ORDER — ACETAMINOPHEN 650 MG RE SUPP: 650.0000 mg | Freq: Four times a day (QID) | RECTAL | Status: DC | PRN

## 2024-01-16 MED ORDER — SIMVASTATIN 20 MG PO TABS: 20.0000 mg | ORAL_TABLET | Freq: Every evening | ORAL | Status: DC

## 2024-01-16 MED ORDER — LEVETIRACETAM 1000 MG PO TABS
1000.0000 mg | ORAL_TABLET | Freq: Two times a day (BID) | ORAL | 0 refills | Status: DC
  Filled 2024-01-16 (×2): qty 60, 30d supply, fill #0
  Filled 2024-02-13: qty 60, 30d supply, fill #1

## 2024-01-16 NOTE — Assessment & Plan Note (Signed)
 01-16-2024 stable.

## 2024-01-16 NOTE — ED Notes (Signed)
 Patient is resting on stretcher. AAOx4. Denies any pain at this time. Patient wife is at bedside. Continuous monitoring.

## 2024-01-16 NOTE — Assessment & Plan Note (Signed)
 01-16-2024 f/u with oncology

## 2024-01-16 NOTE — Assessment & Plan Note (Signed)
 01-16-2024 stable. On norvasc , lopressor  and cozaar .

## 2024-01-16 NOTE — Assessment & Plan Note (Signed)
 01-16-2024 due to seizure. Resolved with IVF.

## 2024-01-16 NOTE — Assessment & Plan Note (Signed)
 01-16-2024 Na 135 today. Resolved.

## 2024-01-16 NOTE — Assessment & Plan Note (Signed)
 01-16-2024 neurology recommended stopping Wellbutrin 

## 2024-01-16 NOTE — H&P (Addendum)
 History and Physical    [facility] FMW:[id number] DOB: 1956-04-10 DOA: 01/15/2024  Patient coming from: Home.  Chief Complaint: Seizures.  HPI: [facility] is a 68 y.o. 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 Buckley was brought to the ER after patient was found to have left-sided weakness following which patient had seizure-like activity on the left side at around 9 PM yesterday at home.  Patient has been having headache for the last 24 hours and he tried some Tylenol  and also some leftover oxycodone  the headache eased up little bit.  Later in the evening around 9 PM when he was walking to the bathroom when he had a fall but did not hit his head or lose consciousness.  Patient's wife states he was with him she helped him to the bathroom and while on the commode he started developing left-sided weakness with seizure and EMS was called.  Patient's wife states that over the last 3 weeks he has been having some poor vision in the left eye.  ED Course: In the ER patient was given 2 mg IV Ativan  and also loading dose of Keppra .  CT head shows nothing acute.  Neurology was consulted.  Patient admitted for new onset seizures.  Labs show sodium of 134 creatinine 1.3 WBC 5.9 hemoglobin 16.3.  At the time of my exam patient is mildly somnolent but answers questions appropriately mild weakness in the left lower extremity.  Review of Systems: As per HPI, rest all negative.   Past Medical History:  Diagnosis Date   Anxiety    BPH (benign prostatic hyperplasia)    Cancer (HCC) 05/31/2004   Brain tumor, in remission after chemo radiation and  surgery   Diabetes mellitus without complication (HCC)    ED (erectile dysfunction)    Heart murmur    History of heart murmur   Hyperlipidemia    Hypertension    Sleep apnea    USES C-PAP   UTI (urinary tract infection)     Past Surgical History:  Procedure Laterality Date   BRAIN SURGERY  2006   BRAIN TUMOR  - CANCER   COLONOSCOPY     CYSTOSCOPY N/A 07/29/2015   Procedure: FLEXIBLE CYSTOSCOPY;  Surgeon: Norleen Seltzer, MD;  Location: [facility];  Service: Urology;  Laterality: N/A;   FOOT BONE EXCISION  1989   PENILE PROSTHESIS IMPLANT N/A 07/29/2015   Procedure: PENILE PROSTHESIS;  Surgeon: Norleen Seltzer, MD;  Location: [facility];  Service: Urology;  Laterality: N/A;   right knee meniscus repair  2010   THYROID  LOBECTOMY  05/12/2012   Procedure: THYROID  LOBECTOMY;  Surgeon: Krystal CHRISTELLA Spinner, MD;  Location: [facility];  Service: General;  Laterality: Left;  left thyroid  lobectomy     reports that he quit smoking about 27 years ago. His smoking use included cigarettes. He started smoking about 50 years ago. He has a 23 pack-year smoking history. He has never used smokeless tobacco. He reports that he does not drink alcohol and does not use drugs.  Allergies  Allergen Reactions   Sildenafil Other (See Comments) and Nausea Only   Gadolinium Derivatives Nausea And Vomiting    Per pt , always get sick with  gado and never offered anti nausea meds.    Family History  Problem Relation Age of Onset   Breast cancer Mother    Cancer Brother 41   Heart attack Brother    Heart attack Brother    Birth defects Son    Colon cancer Neg Hx    Liver disease Neg Hx    Esophageal cancer Neg Hx     Prior to Admission medications   Medication Sig Start Date End Date Taking? Authorizing Provider  acetaminophen  (TYLENOL ) 325 MG tablet Take 975 mg by mouth every 6 (six) hours as needed. 04/19/23  Yes [provider]  amLODipine  (NORVASC ) 5 MG tablet Take 5 mg by mouth daily.   Yes [provider]  buPROPion  (WELLBUTRIN  SR) 100 MG 12 hr tablet Take 100 mg by mouth every morning. 11/29/19  Yes [provider]  JARDIANCE 25 MG TABS tablet Take 25 mg by mouth daily. 05/23/23  Yes [provider]  lomustine  (GLEOSTINE ) 40 MG capsule Take 5 capsules (200 mg total) by mouth once for 1 dose. Take on an empty stomach 1 hour before or 2 hours after meals. 01/04/24 01/15/25 Yes Vaslow, Zachary K, MD  losartan -hydrochlorothiazide  (HYZAAR) 100-12.5 MG tablet Take 1 tablet by mouth daily.   Yes [provider]  metFORMIN (GLUCOPHAGE-XR) 500 MG 24 hr tablet Take 1,000 mg by mouth daily. 01/17/23  Yes [provider]  metoprolol  tartrate (LOPRESSOR ) 12.5 mg TABS tablet Take 12.5 mg by mouth daily.   Yes [provider]  oxyCODONE  (OXY IR/ROXICODONE ) 5 MG immediate release tablet Take 5 mg by mouth every 4 (four) hours as needed. 04/19/23  Yes [provider]  pioglitazone (ACTOS) 15 MG tablet Take 15 mg by mouth daily. 11/26/19  Yes [provider]  predniSONE  (DELTASONE ) 50 MG tablet Take 1 tablet at 13 hours, 7 hours, and 1 hour prior to IV contrast 09/20/23  Yes Vaslow, Zachary K, MD  simvastatin  (ZOCOR ) 20 MG tablet Take 20 mg by mouth daily. 05/16/15  Yes [provider]  tamsulosin  (FLOMAX ) 0.4 MG CAPS capsule Take 0.4 mg by mouth daily. 05/04/23  Yes [provider]    Physical Exam: Constitutional: Moderately built and nourished. Vitals:   01/15/24 2320 01/16/24 0200  BP: (!) 150/75 134/70  Pulse: 68 (!) 50  Resp: (!) 22 16  Temp: 97.7 F (36.5 C)   TempSrc: Temporal   SpO2: 95% 95%  Weight: 104.3 kg    Eyes: Anicteric no pallor. ENMT: No discharge from the ears eyes nose or mouth. Neck: No mass felt.  No neck rigidity. Respiratory: No rhonchi or crepitations. Cardiovascular: S1-S2 heard. Abdomen: Soft nontender bowel sound present. Musculoskeletal: No edema. Skin: No rash. Neurologic: Mildly  somnolent but oriented to place and person moving all extremities mildly weak on the left lower extremity. Psychiatric: Mildly somnolent.   Labs on Admission: I have personally reviewed following labs and imaging studies  CBC: Recent Labs  Lab 01/15/24 2354 01/16/24 0008  WBC 5.9  --   NEUTROABS 5.1  --   HGB 16.3 17.0  HCT 50.2 50.0  MCV 81.0  --   PLT 195  --    Basic Metabolic Panel: Recent Labs  Lab 01/15/24 2354 01/16/24 0008  NA 134* 137  K 4.9 4.4  CL 102 103  CO2 16*  --   GLUCOSE 146* 142*  BUN 15 19  CREATININE 1.32* 1.10  CALCIUM 8.6*  --    GFR:  Estimated Creatinine Clearance: 81.2 mL/min (by C-G formula based on SCr of 1.1 mg/dL). Liver Function Tests: Recent Labs  Lab 01/15/24 2354  AST 24  ALT 14  ALKPHOS 46  BILITOT 0.9  PROT 6.2*  ALBUMIN 3.3*   No results for input(s): LIPASE, AMYLASE in the last 168 hours. No results for input(s): AMMONIA in the last 168 hours. Coagulation Profile: Recent Labs  Lab 01/15/24 2354  INR 1.0   Cardiac Enzymes: No results for input(s): CKTOTAL, CKMB, CKMBINDEX, TROPONINI in the last 168 hours. BNP (last 3 results) No results for input(s): PROBNP in the last 8760 hours. HbA1C: No results for input(s): HGBA1C in the last 72 hours. CBG: Recent Labs  Lab 01/15/24 2304  GLUCAP 163*   Lipid Profile: No results for input(s): CHOL, HDL, LDLCALC, TRIG, CHOLHDL, LDLDIRECT in the last 72 hours. Thyroid  Function Tests: No results for input(s): TSH, T4TOTAL, FREET4, T3FREE, THYROIDAB in the last 72 hours. Anemia Panel: No results for input(s): VITAMINB12, FOLATE, FERRITIN, TIBC, IRON, RETICCTPCT in the last 72 hours. Urine analysis:    Component Value Date/Time   COLORURINE STRAW (A) 04/22/2023 2210   APPEARANCEUR CLEAR 04/22/2023 2210   LABSPEC 1.006 04/22/2023 2210   PHURINE 5.0 04/22/2023 2210   GLUCOSEU 150 (A) 04/22/2023 2210   HGBUR SMALL (A)  04/22/2023 2210   BILIRUBINUR NEGATIVE 04/22/2023 2210   KETONESUR NEGATIVE 04/22/2023 2210   PROTEINUR NEGATIVE 04/22/2023 2210   UROBILINOGEN 1.0 08/28/2011 1617   NITRITE NEGATIVE 04/22/2023 2210   LEUKOCYTESUR TRACE (A) 04/22/2023 2210   Sepsis Labs: @LABRCNTIP (procalcitonin:4,lacticidven:4) )No results found for this or any previous visit (from the past 240 hours).   Radiological Exams on Admission: CT HEAD CODE STROKE WO CONTRAST Result Date: 01/15/2024 EXAM: CT HEAD WITHOUT CONTRAST 01/15/2024 11:09:42 PM TECHNIQUE: CT of the head was performed without the administration of intravenous contrast. Automated exposure control, iterative reconstruction, and/or weight based adjustment of the mA/kV was utilized to reduce the radiation dose to as low as reasonably achievable. COMPARISON: 04/02/2023 and 12/22/2023 CLINICAL HISTORY: Neuro deficit, acute, stroke suspected. FINDINGS: BRAIN AND VENTRICLES: Subdural hematoma of 8 mm thickness underlying the right parietal craniotomy site. Allowing for differences in technique this appears unchanged compared to the MRI of 12/22/2023. Remote left-sided craniotomy. There is diffuse white matter hypoattenuation throughout the posterior right hemisphere at the area of tumor resection. No midline shift. No hydrocephalus. Chronic gliosis in the left frontal white matter is unchanged. Improved mass effect on the right lateral ventricle. ORBITS: No acute abnormality. SINUSES: No acute abnormality. SOFT TISSUES AND SKULL: No acute soft tissue abnormality. No skull fracture. IMPRESSION: 1. Allowing for modality difference, subdural hematoma of 8 mm thickness underlying the right parietal craniotomy site is unchanged compared to the MRI of 12/22/23. 2. Diffuse white matter hypoattenuation throughout the posterior right hemisphere at the area of tumor resection, with improved mass effect on the right lateral ventricle. No midline shift or hydrocephalus. 3. Chronic gliosis  in the left frontal white matter, unchanged. 4. Findings communicated to Dr. [name] at 11:19 pm on 01/15/24. Electronically signed by: Franky Stanford MD 01/15/2024 11:20 PM EDT RP Workstation: [id number]    EKG: Independently reviewed.  Normal sinus rhythm.  Assessment/Plan Principal Problem:   Seizures (HCC) Active Problems:   Anaplastic oligodendroglioma of frontal lobe (HCC)   OSA on CPAP   Glioblastoma, IDH-wildtype (HCC)   Depression   Essential hypertension   Diabetes mellitus type 2 in nonobese (HCC)   Hyponatremia  Seizure -    discussed with neurologist Dr. Vanessa.  Patient was given loading dose of Keppra  and Keppra  continued.  Seizure precautions.  Awaiting further recommendations from neurologist. Glioblastoma being followed at [facility] oncology and also Dr. Buckley.  Presently on lomustine  and Avastin . Diabetes mellitus type 2 last hemoglobin A1c in the chart is in November 2024 was 8.  Takes Jardiance and metformin.  Presently on sliding scale coverage. Hypertension will continue with metoprolol  amlodipine  and losartan .  Will hold hydrochlorothiazide  due to mild hyponatremia and increased creatinine. Acute renal failure with hyponatremia could be from dehydration.  Gently hydrate hold hydrochlorothiazide  due to hyponatremia which is mild.  If creatinine does not improve yet hold losartan .   Hyperlipidemia on statins. Sleep apnea on CPAP at bedtime. Depression on Wellbutrin .  Since patient has seizures neurology recommended stopping Wellbutrin . BPH on tamsulosin .  Since patient has seizures will need close monitoring further workup and more than 2 midnight stay.   DVT prophylaxis: SCDs. Code Status: Full code. Family Communication: Patient's wife and daughter at the bedside. Disposition Plan: Progressive care. Consults called: Neurology. Admission status: Observation.

## 2024-01-16 NOTE — Discharge Summary (Signed)
 [facility] Phys[facility] Discharge Summary   Patient name: Randall Lewis  Admit date:     01/15/2024  Discharge date: 01/16/2024  Attending Physician: Randall Lewis [[id number]]  Discharge Physician: Randall Lewis   PCP: Randall Toribio MATSU, MD  Admitted From: Home  Disposition:  Home  Recommendations for Outpatient Follow-up:  Follow up with PCP in 1-2 weeks F/U with [facility] in 4-6 weeks  Home Health:No Equipment/Devices: None    Discharge Condition:Stable CODE STATUS:FULL Diet recommendation: Heart Healthy Fluid Restriction: None  Hospital Summary: CC: headache, left sided weakness HPI:  Randall Lewis is a 68 y.o. 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 Buckley was brought to the ER after patient was found to have left-sided weakness following which patient had seizure-like activity on the left side at around 9 PM yesterday at home.  Patient has been having headache for the last 24 hours and he tried some Tylenol  and also some leftover oxycodone  the headache eased up little bit.  Later in the evening around 9 PM when he was walking to the bathroom when he had a fall but did not hit his head or lose consciousness.  Patient's wife states he was with him she helped him to the bathroom and while on the commode he started developing left-sided weakness with seizure and EMS was called.  Patient's wife states that over the last 3 weeks he has been having some poor vision in the left eye.   ED Course: In the ER patient was given 2 mg IV Ativan  and also loading dose of Keppra .  CT head shows nothing acute.   Neurology was consulted.  Patient admitted for new onset seizures.  Labs show sodium of 134 creatinine 1.3 WBC 5.9 hemoglobin 16.3.  At the time of my exam patient is mildly somnolent but answers questions appropriately mild weakness in the left lower extremity.  Significant Events: Admitted 01/15/2024 for seizure 01-16-2024 seen in consult by neurology  Admission Labs: Na 134, K 4.9, CO2 of 16, BUN 15, Scr 1.32 WBC 6.9, HgB 16, plt 193  Admission Imaging Studies: CT head Allowing for modality difference, subdural hematoma of 8 mm thickness underlying the right parietal craniotomy site is unchanged compared to the MRI of 12/22/23. 2. Diffuse white matter hypoattenuation throughout the posterior right hemisphere at the area of tumor resection, with improved mass effect on the right lateral ventricle. No midline shift or hydrocephalus. 3. Chronic gliosis in the left frontal white matter, unchanged.  Significant Labs: A1c 6.6%  Significant Imaging Studies:   Antibiotic Therapy: Anti-infectives (From admission, onward)    None       Procedures: EEG  Consultants: Neurology   Hospital Course by Problem: * Seizures (HCC) 01-16-2024 EEG negative for active seizure but did show cortical dysfunction arising from right hemisphere, maximal right temporo-parietal region likely secondary to underlying structural abnormality. Pt seen by neurology consult that recommended stopping Welbutrin and starting keppra  1000 mg bid. Pt will f/u with [facility]. Pt's wife also has hx of Epilepsy and is familiar with seizure disorder. Pt seen by PT/OT who recommended only outpatient PT/OT if needed. Stable for DC.  Glioblastoma, IDH-wildtype (HCC) 01-16-2024 f/u with  oncology  Hyponatremia-resolved as of 01/16/2024 01-16-2024 Na 135 today. Resolved.  Acute metabolic acidosis 01-16-2024 due to seizure. Resolved with IVF.  Diabetes mellitus type 2 in nonobese (HCC) 01-16-2024  stable.  Essential hypertension 01-16-2024 stable. On norvasc , lopressor  and cozaar .  Depression 01-16-2024 neurology recommended stopping Wellbutrin    OSA on CPAP 01-16-2024 stable.  Obesity, Class I, BMI 30-34.9 Body mass index is 31.89 kg/m.     Discharge Diagnoses:  Principal Problem:   Seizures (HCC) Active Problems:   Glioblastoma, IDH-wildtype (HCC)   OSA on CPAP   Depression   Essential hypertension   Diabetes mellitus type 2 in nonobese (HCC)   Acute metabolic acidosis   Obesity, Class I, BMI 30-34.9   Discharge Instructions  Discharge Instructions     Ambulatory referral to Neurology   Complete by: As directed    An appointment is requested in approximately: 4 weeks   Call MD for:  difficulty breathing, headache or visual disturbances   Complete by: As directed    Call MD for:  extreme fatigue   Complete by: As directed    Call MD for:  hives   Complete by: As directed    Call MD for:  persistant dizziness or light-headedness   Complete by: As directed    Call MD for:  persistant nausea and vomiting   Complete by: As directed    Call MD for:  redness, tenderness, or signs of infection (pain, swelling, redness, odor or green/yellow discharge around incision site)   Complete by: As directed    Call MD for:  severe uncontrolled pain   Complete by: As directed    Call MD for:  temperature >100.4   Complete by: As directed    Diet - low sodium heart healthy   Complete by: As directed    Discharge instructions   Complete by: As directed    1. Follow up with your primary care provider in 1-2 weeks following discharge from hospital. 2. Ambulatory referral made to [facility]. Please call office for appointment in 4-6 weeks.   Increase activity slowly   Complete by: As directed       Allergies as of 01/16/2024       Reactions   Sildenafil Other (See Comments), Nausea Only   Gadolinium Derivatives Nausea And Vomiting   Per pt ,  always get sick with gado and never offered anti nausea meds.        Medication List     STOP taking these medications    buPROPion  ER 100 MG 12 hr tablet Commonly known as: WELLBUTRIN  SR       TAKE these medications    acetaminophen  325 MG tablet Commonly known as: TYLENOL  Take 975 mg by mouth every 6 (six) hours as needed.   amLODipine  5 MG tablet Commonly known as: NORVASC  Take 5 mg by mouth daily.   Gleostine  40 MG capsule Generic drug: lomustine  Take 5 capsules (200 mg total) by mouth once for 1 dose. Take on an empty stomach 1 hour before or 2 hours after meals.   Jardiance 25 MG Tabs tablet Generic drug: empagliflozin Take 25 mg by mouth daily.   levETIRAcetam  1000 MG tablet Commonly known as: KEPPRA  Take 1 tablet (1,000 mg total) by mouth 2 (two) times daily.   losartan -hydrochlorothiazide  100-12.5 MG tablet Commonly known as: HYZAAR Take 1 tablet by mouth daily.   metFORMIN 500 MG 24 hr tablet Commonly known as: GLUCOPHAGE-XR Take 1,000 mg by mouth daily.  metoprolol  tartrate 12.5 mg Tabs tablet Commonly known as: LOPRESSOR  Take 12.5 mg by mouth daily.   oxyCODONE  5 MG immediate release tablet Commonly known as: Oxy IR/ROXICODONE  Take 5 mg by mouth every 4 (four) hours as needed.   pioglitazone 15 MG tablet Commonly known as: ACTOS Take 15 mg by mouth daily.   predniSONE  50 MG tablet Commonly known as: DELTASONE  Take 1 tablet at 13 hours, 7 hours, and 1 hour prior to IV contrast   simvastatin  20 MG tablet Commonly known as: ZOCOR  Take 20 mg by mouth daily.   tamsulosin  0.4 MG Caps capsule Commonly known as: FLOMAX  Take 0.4 mg by mouth daily.        Follow-up Information     [facility]. Schedule an appointment as soon as possible for a visit in 4 week(s).   Contact information: [address]     [address] [place]  [address] [phone]               Allergies  Allergen  Reactions   Sildenafil Other (See Comments) and Nausea Only   Gadolinium Derivatives Nausea And Vomiting    Per pt , always get sick with gado and never offered anti nausea meds.    Discharge Exam: Vitals:   01/16/24 1044 01/16/24 1153  BP: (!) 144/86   Pulse:    Resp:    Temp:  98.1 F (36.7 C)  SpO2:      Physical Exam Vitals and nursing note reviewed.  HENT:     Head: Normocephalic.  Eyes:     General: No scleral icterus. Cardiovascular:     Rate and Rhythm: Normal rate and regular rhythm.  Pulmonary:     Effort: Pulmonary effort is normal.  Abdominal:     General: Bowel sounds are normal. There is no distension.     Palpations: Abdomen is soft.  Musculoskeletal:     Right lower leg: No edema.     Left lower leg: No edema.  Skin:    General: Skin is warm and dry.     Capillary Refill: Capillary refill takes less than 2 seconds.  Neurological:     Mental Status: He is alert and oriented to person, place, and time.     The results of significant diagnostics from this hospitalization (including imaging, microbiology, ancillary and laboratory) are listed below for reference.     Labs: Basic Metabolic Panel: Recent Labs  Lab 01/15/24 2354 01/16/24 0008 01/16/24 0252  NA 134* 137 135  K 4.9 4.4 4.1  CL 102 103 104  CO2 16*  --  23  GLUCOSE 146* 142* 115*  BUN 15 19 14   CREATININE 1.32* 1.10 1.17  CALCIUM 8.6*  --  8.7*   Liver Function Tests: Recent Labs  Lab 01/15/24 2354 01/16/24 0252  AST 24 18  ALT 14 15  ALKPHOS 46 42  BILITOT 0.9 0.8  PROT 6.2* 6.0*  ALBUMIN 3.3* 3.2*   CBC: Recent Labs  Lab 01/15/24 2354 01/16/24 0008 01/16/24 0252  WBC 5.9  --  6.9  NEUTROABS 5.1  --   --   HGB 16.3 17.0 16.0  HCT 50.2 50.0 49.8  MCV 81.0  --  81.2  PLT 195  --  193   CBG: Recent Labs  Lab 01/15/24 2304 01/16/24 0740 01/16/24 1152  GLUCAP 163* 94 109*   Hgb A1c Recent Labs    01/16/24 0252  HGBA1C 6.6*   Sepsis Labs Recent Labs  Lab 01/15/24 2354 01/16/24 0252  WBC 5.9 6.9    Procedures/Studies: EEG adult Result Date: 01/16/2024 Shelton Arlin KIDD, MD     01/16/2024  8:59 AM Patient Name: SAAGAR TORTORELLA MRN: [id number] Epilepsy Attending: Arlin KIDD Shelton Referring Physician/Provider: Khaliqdina, Salman, MD Date: 01/16/2024 Duration: 30.28 mins Patient history: 68 y.o. 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 EEG to evaluate for seizure. Level of alertness: Awake, asleep AEDs during EEG study: LEV Technical aspects: This EEG study was done with scalp electrodes positioned according to the 10-20 International system of electrode placement. Electrical activity was reviewed with band pass filter of 1-70Hz , sensitivity of 7 uV/mm, display speed of 58mm/sec with a 60Hz  notched filter applied as appropriate. EEG data were recorded continuously and digitally stored.  Video monitoring was available and reviewed as appropriate. Description: The posterior dominant rhythm consists of 8 Hz activity of moderate voltage (25-35 uV) seen predominantly in posterior head regions, asymmetric ( right<left) and reactive to eye opening and eye closing. Sleep was characterized by vertex waves, sleep spindles (12 to 14 Hz), maximal frontocentral region. EEG showed continuous 3 to 6 Hz theta-delta slowing in right hemisphere, maximal right temporo-parietal region. Hyperventilation and photic stimulation were not performed.   ABNORMALITY - Continuous slow, right hemisphere, maximal right temporo-parietal region - Background asymmetry, right<left IMPRESSION: This study is suggestive of cortical dysfunction arising from right hemisphere, maximal right temporo-parietal region likely secondary to underlying structural abnormality. No seizures or epileptiform discharges were seen  throughout the recording. Arlin KIDD Shelton   CT HEAD CODE STROKE WO CONTRAST Result Date: 01/15/2024 EXAM: CT HEAD WITHOUT CONTRAST 01/15/2024 11:09:42 PM TECHNIQUE: CT of the head was performed without the administration of intravenous contrast. Automated exposure control, iterative reconstruction, and/or weight based adjustment of the mA/kV was utilized to reduce the radiation dose to as low as reasonably achievable. COMPARISON: 04/02/2023 and 12/22/2023 CLINICAL HISTORY: Neuro deficit, acute, stroke suspected. FINDINGS: BRAIN AND VENTRICLES: Subdural hematoma of 8 mm thickness underlying the right parietal craniotomy site. Allowing for differences in technique this appears unchanged compared to the MRI of 12/22/2023. Remote left-sided craniotomy. There is diffuse white matter hypoattenuation throughout the posterior right hemisphere at the area of tumor resection. No midline shift. No hydrocephalus. Chronic gliosis in the left frontal white matter is unchanged. Improved mass effect on the right lateral ventricle. ORBITS: No acute abnormality. SINUSES: No acute abnormality. SOFT TISSUES AND SKULL: No acute soft tissue abnormality. No skull fracture. IMPRESSION: 1. Allowing for modality difference, subdural hematoma of 8 mm thickness underlying the right parietal craniotomy site is unchanged compared to the MRI of 12/22/23. 2. Diffuse white matter hypoattenuation throughout the posterior right hemisphere at the area of tumor resection, with improved mass effect on the right lateral ventricle. No midline shift or hydrocephalus. 3. Chronic gliosis in the left frontal white matter, unchanged. 4. Findings communicated to Dr. [name] at 11:19 pm on 01/15/24. Electronically signed by: Randall Stanford MD 01/15/2024 11:20 PM EDT RP Workstation: [id number]   NM PET Metabolic Brain Result Date: 01/06/2024 CLINICAL DATA:  Glioblastoma status post surgical and radiation therapy. Assess tumor recurrence versus tumor  necrosis. EXAM: NM PET METABOLIC BRAIN TECHNIQUE: 9.9 mCi F-18 FDG was injected intravenously. Full-ring PET imaging was performed from the vertex to skull base. CT data was obtained and used for attenuation correction  and anatomic localization. FASTING BLOOD GLUCOSE:  Value: 122 mg/dl COMPARISON:  Brain MRI 12/22/2023, 10/07/2023 FINDINGS: IN comparison to contrast MRI 12/22/2023, the majority of the enhancing expanding tissue in the medial RIGHT parietal lobe (medial to the lateral ventricle) does not have hypermetabolic activity. There is a focus of hypermetabolic along the medial border of the enhancing tissue which is moderately hypermetabolic. This activity is in the vicinity of the smaller enhancing lesion seen on MRI of 10/07/2023. More superiorly there is a rim of mild metabolic activity in the medial parietal lobe which corresponds to high-density rim on comparison MRI and may represent blood product. This activity is mild and not favored high-grade neoplasm. IMPRESSION: 1. Equivocal exam. Focus of metabolic activity along the medial aspect of the enhancing tissue in medial RIGHT parietal lobe corresponds to the smaller lesion described on more remote scan of 10/07/2023. The larger expanding enhancing tissue for the most part is not significantly hypermetabolic. 2. Mild metabolic activity associated superior medial RIGHT parietal tissue is rounded and hyperdense tissue and is not tumor recurrence. Electronically Signed   By: Jackquline Boxer M.D.   On: 01/06/2024 09:27   MR BRAIN W WO CONTRAST Result Date: 12/22/2023 CLINICAL DATA:  Brain/CNS neoplasm, assess treatment response EXAM: MRI HEAD WITHOUT AND WITH CONTRAST TECHNIQUE: Multiplanar, multiecho pulse sequences of the brain and surrounding structures were obtained without and with intravenous contrast. CONTRAST:  10mL GADAVIST  GADOBUTROL  1 MMOL/ML IV SOLN COMPARISON:  MRI of the head dated Oct 07, 2023. FINDINGS: Brain: A centrally necrotic  peripherally enhancing lesion within the medial aspect of the right occipital lobe as increased in size in the interim from 20 x 20 x 10 mm to approximately 45 x 29 x 28 mm. There is also more extensive T2 signal hyperintensity within the surrounding occipital, parietal and temporal lobes. There is a new band of restricted diffusion along the medial ependymal surface of the posterior horn of the right lateral ventricle. There is more hemosiderin staining within the peripherally enhancing lesion. There continue to be numerous foci of hemosiderin staining within the frontal lobes bilaterally, particularly on the left. There are chronic encephalomalacia changes also again demonstrated within the temporal lobe from prior resection. There is no residual tumor evident at the frontal lobe resection site. Vascular: Normal flow voids. Skull and upper cervical spine: Status post right temporoparietal craniotomy and left pterional craniotomy. Sinuses/Orbits: Clear paranasal sinuses.  Normal orbits. Other: None. IMPRESSION: 1. There is been significant interval enlargement of a peripherally enhancing, centrally necrotic lesion within the right medial occipital lobe. There is evidence of intralesional hemorrhage and worsening surrounding T2 signal hyperintensity. There is also new restricted diffusion along the medial ependymal surface of the posterior horn of the right lateral ventricle. 2. Stable left frontal lobe surgical encephalomalacia changes. Electronically Signed   By: Evalene Coho M.D.   On: 12/22/2023 11:49    Time coordinating discharge: 60 mins  SIGNED:  Camellia Door, DO [facility] 01/16/24, 1:43 PM

## 2024-01-16 NOTE — Progress Notes (Signed)
 EEG complete - results pending

## 2024-01-16 NOTE — Evaluation (Signed)
 Occupational Therapy Evaluation Patient Details Name: Randall Lewis MRN: [id number] DOB: 1956/05/22 Today's Date: 01/16/2024   History of Present Illness   68 y.o. male presents to [facility] on 01/15/2024 with L sided twitching and AMS. 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     Clinical Impressions PTA, pt lived with wife and was independent. Pt reports bumping into obstacles on his L frequently for up to 3 weeks after dx with visual field cut. Upon eval, pt with generalized weakness, decreased strength, balance, safety, knowledge of compensatory techniques for visual field cut needing up to CGA and cues for environmental navigation as it relates to mobility and participation in BADL. Recommending Neuro OP OT and follow up with ophthalmologist for full visual fields assessment.      If plan is discharge home, recommend the following:   A little help with walking and/or transfers;A little help with bathing/dressing/bathroom;Assist for transportation;Help with stairs or ramp for entrance;Assistance with cooking/housework     Functional Status Assessment   Patient has had a recent decline in their functional status and demonstrates the ability to make significant improvements in function in a reasonable and predictable amount of time.     Equipment Recommendations   Tub/shower seat     Recommendations for Other Services         Precautions/Restrictions   Precautions Precautions: Other (comment);Fall (L visual field cut) Recall of Precautions/Restrictions: Intact Restrictions Weight Bearing Restrictions Per Provider Order: No     Mobility Bed Mobility Overal bed mobility: Needs Assistance Bed Mobility: Supine to Sit, Sit to Supine     Supine to sit: Supervision Sit to supine: Contact guard assist        Transfers Overall transfer level: Needs assistance    Transfers: Sit to/from Stand Sit to Stand: Supervision           General transfer comment: for STS      Balance Overall balance assessment: Needs assistance Sitting-balance support: No upper extremity supported, Feet supported Sitting balance-Leahy Scale: Good     Standing balance support: Bilateral upper extremity supported, No upper extremity supported Standing balance-Leahy Scale: Fair Standing balance comment: did not challenge balance outside parameters of ADL this session                           ADL either performed or assessed with clinical judgement   ADL Overall ADL's : Needs assistance/impaired Eating/Feeding: Set up;Sitting   Grooming: Supervision/safety;Standing;Cueing for compensatory techniques   Upper Body Bathing: Set up;Sitting   Lower Body Bathing: Supervison/ safety;Sit to/from stand   Upper Body Dressing : Set up;Sitting   Lower Body Dressing: Supervision/safety;Sitting/lateral leans   Toilet Transfer: Contact guard assist Toilet Transfer Details (indicate cue type and reason): cues for compensatory tehcniques to navigate environment         Functional mobility during ADLs: Contact guard assist General ADL Comments: CGA only for safety due to decr self implementation of compensatory techniques for BADL and navigating environment in light of field cut     Vision Baseline Vision/History:  (L visual field cut 3 weeks) Ability to See in Adequate Light: 3 Highly impaired Patient Visual Report: Peripheral vision impairment Vision Assessment?: Yes Eye Alignment: Within Functional Limits Ocular Range of Motion: Within Functional Limits Tracking/Visual Pursuits: Impaired - to be further tested in functional context Saccades: Undershoots (on L) Visual Fields: Left visual field  deficit     Perception Perception: Impaired Preception Impairment Details:  (field cut)     Praxis         Pertinent Vitals/Pain Pain Assessment Pain  Assessment: Faces Faces Pain Scale: No hurt     Extremity/Trunk Assessment Upper Extremity Assessment Upper Extremity Assessment: Generalized weakness;Right hand dominant   Lower Extremity Assessment Lower Extremity Assessment: Defer to PT evaluation       Communication Communication Communication: No apparent difficulties   Cognition Arousal: Alert Behavior During Therapy: WFL for tasks assessed/performed Cognition: Cognition impaired           Executive functioning impairment (select all impairments): Organization, Sequencing, Problem solving OT - Cognition Comments: cues for compensatory techniques for L visual field deficits during mobility and ADL                 Following commands: Intact       Cueing  General Comments   Cueing Techniques: Verbal cues  VSS   Exercises     Shoulder Instructions      Home Living Family/patient expects to be discharged to:: Private residence Living Arrangements: Spouse/significant other Available Help at Discharge: Family;Available PRN/intermittently Type of Home: House Home Access: Stairs to enter [employer] of Steps: 2 Entrance Stairs-Rails: Right;Left;Can reach both Home Layout: Two level;Bed/bath upstairs Alternate Level Stairs-Number of Steps: flight   Bathroom Shower/Tub: Producer, television/film/video: Standard     Home Equipment: Grab bars - tub/shower;Hand held shower head          Prior Functioning/Environment Prior Level of Function : Independent/Modified Independent             Mobility Comments: no AD ADLs Comments: has not been driving; otherwise independent. Pt reports he has been bumping into obstacles on his L since onset visual field cut with resection and radiation to occipital lobe oligodendroglioma    OT Problem List: Decreased strength;Decreased activity tolerance;Impaired balance (sitting and/or standing);Impaired vision/perception;Decreased cognition;Decreased  safety awareness   OT Treatment/Interventions: Self-care/ADL training;Therapeutic exercise;DME and/or AE instruction;Therapeutic activities;Visual/perceptual remediation/compensation;Patient/family education;Balance training      OT Goals(Current goals can be found in the care plan section)   Acute Rehab OT Goals Patient Stated Goal: stop bumping into things OT Goal Formulation: With patient Time For Goal Achievement: 01/30/24 Potential to Achieve Goals: Good   OT Frequency:  Min 2X/week    Co-evaluation              AM-PAC OT 6 Clicks Daily Activity     Outcome Measure Help from another person eating meals?: A Little Help from another person taking care of personal grooming?: A Little Help from another person toileting, which includes using toliet, bedpan, or urinal?: A Lot Help from another person bathing (including washing, rinsing, drying)?: A Lot Help from another person to put on and taking off regular upper body clothing?: A Little Help from another person to put on and taking off regular lower body clothing?: A Little 6 Click Score: 16   End of Session Equipment Utilized During Treatment: Gait belt;Rolling walker (2 wheels) Nurse Communication: Mobility status  Activity Tolerance: Patient tolerated treatment well Patient left: in bed;with call bell/phone within reach  OT Visit Diagnosis: Unsteadiness on feet (R26.81);Muscle weakness (generalized) (M62.81);Low vision, both eyes (H54.2)                Time: 9049-8987 OT Time Calculation (min): 22 min Charges:  OT General Charges $OT Visit: 1 Visit OT Evaluation $OT Eval  Moderate Complexity: 1 Mod  Elma JONETTA Lebron FREDERICK, OTR/L [facility] Acute Rehabilitation Office: [phone]   Elma JONETTA Lebron 01/16/2024, 10:48 AM

## 2024-01-16 NOTE — Assessment & Plan Note (Signed)
 01-16-2024 EEG negative for active seizure but did show cortical dysfunction arising from right hemisphere, maximal right temporo-parietal region likely secondary to underlying structural abnormality. Pt seen by neurology consult that recommended stopping Welbutrin and starting keppra  1000 mg bid. Pt will f/u with [facility]. Pt's wife also has hx of Epilepsy and is familiar with seizure disorder. Pt seen by PT/OT who recommended only outpatient PT/OT if needed. Stable for DC.

## 2024-01-16 NOTE — Progress Notes (Signed)
 PROGRESS NOTE    Randall Lewis  FMW:[id number] DOB: [birth year] DOA: 01/15/2024 PCP: Yolande Toribio MATSU, MD  Subjective: Pt seen and examined. Wife at bedside. Reviewed EEG results and neuro consult recs. Stable for DC today home today.   Hospital Course: CC: headache, left sided weakness HPI:  Randall Lewis is a 68 y.o. male with prior history of left frontal anaplastic oligodendroglioma status postcraniotomy and resection on 03/2005 status post 6 weeks of radiation therapy and concurrent Temodar  ending on 06/22/2005, diabetes mellitus type 2, hypertension, hyperlipidemia, BPH, sleep apnea who had craniotomy on 04/18/2023 for glioblastoma presently on chemo and Avastin  being followed at [facility] and also Dr. Buckley was brought to the ER after patient was found to have left-sided weakness following which patient had seizure-like activity on the left side at around 9 PM yesterday at home.  Patient has been having headache for the last 24 hours and he tried some Tylenol  and also some leftover oxycodone  the headache eased up little bit.  Later in the evening around 9 PM when he was walking to the bathroom when he had a fall but did not hit his head or lose consciousness.  Patient's wife states he was with him she helped him to the bathroom and while on the commode he started developing left-sided weakness with seizure and EMS was called.  Patient's wife states that over the last 3 weeks he has been having some poor vision in the left eye.   ED Course: In the ER patient was given 2 mg IV Ativan  and also loading dose of Keppra .  CT head shows nothing acute.  Neurology was consulted.  Patient admitted for new onset seizures.  Labs show sodium of 134 creatinine 1.3 WBC 5.9 hemoglobin 16.3.  At the time of my exam patient is mildly somnolent but answers questions appropriately mild weakness in the left lower extremity.  Significant Events: Admitted 01/15/2024 for seizure 01-16-2024 seen in consult  by neurology  Admission Labs: Na 134, K 4.9, CO2 of 16, BUN 15, Scr 1.32 WBC 6.9, HgB 16, plt 193  Admission Imaging Studies: CT head Allowing for modality difference, subdural hematoma of 8 mm thickness underlying the right parietal craniotomy site is unchanged compared to the MRI of 12/22/23. 2. Diffuse white matter hypoattenuation throughout the posterior right hemisphere at the area of tumor resection, with improved mass effect on the right lateral ventricle. No midline shift or hydrocephalus. 3. Chronic gliosis in the left frontal white matter, unchanged.  Significant Labs: A1c 6.6%  Significant Imaging Studies:   Antibiotic Therapy: Anti-infectives (From admission, onward)    None       Procedures: EEG  Consultants: Neurology    Assessment and Plan: * Seizures (HCC) 01-16-2024 EEG negative for active seizure but did show cortical dysfunction arising from right hemisphere, maximal right temporo-parietal region likely secondary to underlying structural abnormality. Pt seen by neurology consult that recommended stopping Welbutrin and starting keppra  1000 mg bid. Pt will f/u with [facility]. Pt's wife also has hx of Epilepsy and is familiar with seizure disorder. Pt seen by PT/OT who recommended only outpatient PT/OT if needed. Stable for DC.  Glioblastoma, IDH-wildtype (HCC) 01-16-2024 f/u with oncology  Hyponatremia-resolved as of 01/16/2024 01-16-2024 Na 135 today. Resolved.  Acute metabolic acidosis 01-16-2024 due to seizure. Resolved with IVF.  Diabetes mellitus type 2 in nonobese (HCC) 01-16-2024 stable.  Essential hypertension 01-16-2024 stable. On norvasc , lopressor  and cozaar .  Depression 01-16-2024 neurology recommended stopping Wellbutrin   OSA on CPAP 01-16-2024 stable.  Obesity, Class I, BMI 30-34.9 Body mass index is 31.89 kg/m.    DVT prophylaxis: SCDs Start: 01/16/24 0212     Code Status: Full Code Family  Communication: discussed with pt and wife at bedside Disposition Plan: home Reason for continuing need for hospitalization: stable for DC to home.  Objective: Vitals:   01/16/24 1030 01/16/24 1044 01/16/24 1153 01/16/24 1249  BP: (!) 144/88 (!) 144/86    Pulse: 85     Resp: 20     Temp:   98.1 F (36.7 C)   TempSrc:   Oral Oral  SpO2: 97%     Weight:    103.7 kg  Height:    5' 11 (1.803 m)    Intake/Output Summary (Last 24 hours) at 01/16/2024 1340 Last data filed at 01/16/2024 0303 Gross per 24 hour  Intake --  Output 700 ml  Net -700 ml   Filed Weights   01/15/24 2320 01/16/24 1249  Weight: 104.3 kg 103.7 kg    Examination:  Physical Exam Vitals and nursing note reviewed.  HENT:     Head: Normocephalic.  Eyes:     General: No scleral icterus. Cardiovascular:     Rate and Rhythm: Normal rate and regular rhythm.  Pulmonary:     Effort: Pulmonary effort is normal.  Abdominal:     General: Bowel sounds are normal. There is no distension.     Palpations: Abdomen is soft.  Musculoskeletal:     Right lower leg: No edema.     Left lower leg: No edema.  Skin:    General: Skin is warm and dry.     Capillary Refill: Capillary refill takes less than 2 seconds.  Neurological:     Mental Status: He is alert and oriented to person, place, and time.    Data Reviewed: I have personally reviewed following labs and imaging studies  CBC: Recent Labs  Lab 01/15/24 2354 01/16/24 0008 01/16/24 0252  WBC 5.9  --  6.9  NEUTROABS 5.1  --   --   HGB 16.3 17.0 16.0  HCT 50.2 50.0 49.8  MCV 81.0  --  81.2  PLT 195  --  193   Basic Metabolic Panel: Recent Labs  Lab 01/15/24 2354 01/16/24 0008 01/16/24 0252  NA 134* 137 135  K 4.9 4.4 4.1  CL 102 103 104  CO2 16*  --  23  GLUCOSE 146* 142* 115*  BUN 15 19 14   CREATININE 1.32* 1.10 1.17  CALCIUM 8.6*  --  8.7*   GFR: Estimated Creatinine Clearance: 76.2 mL/min (by C-G formula based on SCr of 1.17 mg/dL). Liver  Function Tests: Recent Labs  Lab 01/15/24 2354 01/16/24 0252  AST 24 18  ALT 14 15  ALKPHOS 46 42  BILITOT 0.9 0.8  PROT 6.2* 6.0*  ALBUMIN 3.3* 3.2*   Coagulation Profile: Recent Labs  Lab 01/15/24 2354  INR 1.0   HbA1C: Recent Labs    01/16/24 0252  HGBA1C 6.6*   CBG: Recent Labs  Lab 01/15/24 2304 01/16/24 0740 01/16/24 1152  GLUCAP 163* 94 109*   Radiology Studies: EEG adult Result Date: 01/16/2024 Shelton Arlin KIDD, MD     01/16/2024  8:59 AM Patient Name: Randall Lewis MRN: [id number] Epilepsy Attending: Arlin KIDD Shelton Referring Physician/Provider: Khaliqdina, Salman, MD Date: 01/16/2024 Duration: 30.28 mins Patient history: 68 y.o. 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 EEG to evaluate for seizure. Level of alertness: Awake, asleep AEDs during EEG study: LEV Technical aspects: This EEG study was done with scalp electrodes positioned according to the 10-20 International system of electrode placement. Electrical activity was reviewed with band pass filter of 1-70Hz , sensitivity of 7 uV/mm, display speed of 65mm/sec with a 60Hz  notched filter applied as appropriate. EEG data were recorded continuously and digitally stored.  Video monitoring was available and reviewed as appropriate. Description: The posterior dominant rhythm consists of 8 Hz activity of moderate voltage (25-35 uV) seen predominantly in posterior head regions, asymmetric ( right<left) and reactive to eye opening and eye closing. Sleep was characterized by vertex waves, sleep spindles (12 to 14 Hz), maximal frontocentral region. EEG showed continuous 3 to 6 Hz theta-delta slowing in right hemisphere, maximal right temporo-parietal region. Hyperventilation and photic stimulation were not performed.   ABNORMALITY - Continuous slow, right  hemisphere, maximal right temporo-parietal region - Background asymmetry, right<left IMPRESSION: This study is suggestive of cortical dysfunction arising from right hemisphere, maximal right temporo-parietal region likely secondary to underlying structural abnormality. No seizures or epileptiform discharges were seen throughout the recording. Arlin MALVA Krebs   CT HEAD CODE STROKE WO CONTRAST Result Date: 01/15/2024 EXAM: CT HEAD WITHOUT CONTRAST 01/15/2024 11:09:42 PM TECHNIQUE: CT of the head was performed without the administration of intravenous contrast. Automated exposure control, iterative reconstruction, and/or weight based adjustment of the mA/kV was utilized to reduce the radiation dose to as low as reasonably achievable. COMPARISON: 04/02/2023 and 12/22/2023 CLINICAL HISTORY: Neuro deficit, acute, stroke suspected. FINDINGS: BRAIN AND VENTRICLES: Subdural hematoma of 8 mm thickness underlying the right parietal craniotomy site. Allowing for differences in technique this appears unchanged compared to the MRI of 12/22/2023. Remote left-sided craniotomy. There is diffuse white matter hypoattenuation throughout the posterior right hemisphere at the area of tumor resection. No midline shift. No hydrocephalus. Chronic gliosis in the left frontal white matter is unchanged. Improved mass effect on the right lateral ventricle. ORBITS: No acute abnormality. SINUSES: No acute abnormality. SOFT TISSUES AND SKULL: No acute soft tissue abnormality. No skull fracture. IMPRESSION: 1. Allowing for modality difference, subdural hematoma of 8 mm thickness underlying the right parietal craniotomy site is unchanged compared to the MRI of 12/22/23. 2. Diffuse white matter hypoattenuation throughout the posterior right hemisphere at the area of tumor resection, with improved mass effect on the right lateral ventricle. No midline shift or hydrocephalus. 3. Chronic gliosis in the left frontal white matter, unchanged. 4.  Findings communicated to Dr. [name] at 11:19 pm on 01/15/24. Electronically signed by: Kevin Herman MD 01/15/2024 11:20 PM EDT RP Workstation: [id number]    Scheduled Meds:  amLODipine   5 mg Oral Daily   Chlorhexidine  Gluconate Cloth  6 each Topical Daily   insulin  aspart  0-9 Units Subcutaneous TID WC   levETIRAcetam   1,000 mg Oral BID   losartan   100 mg Oral Daily   metoprolol  tartrate  12.5 mg Oral Daily   simvastatin   20 mg Oral QPM   tamsulosin   0.4 mg Oral Daily   Continuous Infusions:   LOS: 0 days   Time spent: 55 minutes  Camellia Door, DO  [facility]  01/16/2024, 1:40 PM

## 2024-01-16 NOTE — Subjective & Objective (Signed)
 Pt seen and examined. Wife at bedside. Reviewed EEG results and neuro consult recs. Stable for DC today home today.

## 2024-01-16 NOTE — ED Notes (Signed)
Pt meal tray delivered

## 2024-01-16 NOTE — Consult Note (Signed)
 NEUROLOGY CONSULT NOTE   Date of service: January 16, 2024 Patient Name: Randall Lewis MRN:  [id number] DOB:  1955-09-12 Chief Complaint: seizure Requesting Provider: Franky Redia SAILOR, MD  History of Present Illness  Randall Lewis is a 68 y.o. 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  Per wife and EMS, patient unresponsive. When EMS arrived, patient was confused and weak on the left. Enroute, developed L facial, arm and L leg twitching concerning for seizures.  On arrival to the ED, still having persistent twitching on the left but following commands and asnwering all questions. Seizure resolved with Ativan  and Keppra  load.  Left sided weakness improved after 20 mins.  No prior hx of seizures. He is on wellbutrin .   ROS  Comprehensive ROS performed and pertinent positives documented in HPI   Past History   Past Medical History:  Diagnosis Date   Anxiety    BPH (benign prostatic hyperplasia)    Cancer (HCC) 05/31/2004   Brain tumor, in remission after chemo radiation and surgery   Diabetes mellitus without complication (HCC)    ED (erectile dysfunction)    Heart murmur    History of heart murmur   Hyperlipidemia    Hypertension    Sleep apnea    USES C-PAP   UTI (urinary tract infection)     Past Surgical History:  Procedure Laterality Date   BRAIN SURGERY  2006   BRAIN TUMOR  - CANCER   COLONOSCOPY     CYSTOSCOPY N/A 07/29/2015   Procedure: FLEXIBLE CYSTOSCOPY;  Surgeon: Norleen Seltzer, MD;  Location: [facility];  Service: Urology;  Laterality: N/A;   FOOT BONE EXCISION  1989   PENILE PROSTHESIS IMPLANT N/A 07/29/2015   Procedure: PENILE PROSTHESIS;  Surgeon: Norleen Seltzer, MD;  Location: [facility];  Service: Urology;  Laterality: N/A;   right knee meniscus repair  2010   THYROID  LOBECTOMY  05/12/2012   Procedure: THYROID   LOBECTOMY;  Surgeon: Krystal CHRISTELLA Spinner, MD;  Location: [facility];  Service: General;  Laterality: Left;  left thyroid  lobectomy    Family History: Family History  Problem Relation Age of Onset   Breast cancer Mother    Cancer Brother 7   Heart attack Brother    Heart attack Brother    Birth defects Son    Colon cancer Neg Hx    Liver disease Neg Hx    Esophageal cancer Neg Hx     Social History  reports that he quit smoking about 27 years ago. His smoking use included cigarettes. He started smoking about 50 years ago. He has a 23 pack-year smoking history. He has never used smokeless tobacco. He reports that he does not drink alcohol and does not use drugs.  Allergies  Allergen Reactions   Sildenafil Other (See Comments) and Nausea Only   Gadolinium Derivatives Nausea And Vomiting    Per pt , always get sick with gado and never offered anti nausea meds.    Medications   Current Facility-Administered Medications:    acetaminophen  (TYLENOL ) tablet 650 mg, 650 mg, Oral, Q6H PRN **OR** acetaminophen  (TYLENOL ) suppository 650 mg, 650 mg, Rectal, Q6H PRN, Franky Redia SAILOR, MD   amLODipine  (NORVASC ) tablet 5 mg, 5 mg, Oral, Daily, Franky Redia SAILOR, MD   insulin  aspart (novoLOG ) injection 0-9 Units, 0-9 Units, Subcutaneous, TID WC, Franky Redia SAILOR, MD  levETIRAcetam  (KEPPRA ) undiluted injection 1,000 mg, 1,000 mg, Intravenous, BID, Breia Ocampo, MD   losartan  (COZAAR ) tablet 100 mg, 100 mg, Oral, Daily, Kakrakandy, Arshad N, MD   metoprolol  tartrate (LOPRESSOR ) tablet 12.5 mg, 12.5 mg, Oral, Daily, Franky Redia SAILOR, MD   simvastatin  (ZOCOR ) tablet 20 mg, 20 mg, Oral, QPM, Franky Redia SAILOR, MD   tamsulosin  (FLOMAX ) capsule 0.4 mg, 0.4 mg, Oral, Daily, Franky Redia SAILOR, MD  Current Outpatient Medications:    acetaminophen  (TYLENOL ) 325 MG tablet, Take 975 mg by mouth every 6 (six) hours as needed., Disp: , Rfl:    amLODipine  (NORVASC ) 5 MG tablet, Take 5 mg by  mouth daily., Disp: , Rfl:    buPROPion  (WELLBUTRIN  SR) 100 MG 12 hr tablet, Take 100 mg by mouth every morning., Disp: , Rfl:    JARDIANCE 25 MG TABS tablet, Take 25 mg by mouth daily., Disp: , Rfl:    lomustine  (GLEOSTINE ) 40 MG capsule, Take 5 capsules (200 mg total) by mouth once for 1 dose. Take on an empty stomach 1 hour before or 2 hours after meals., Disp: 5 capsule, Rfl: 0   losartan -hydrochlorothiazide  (HYZAAR) 100-12.5 MG tablet, Take 1 tablet by mouth daily., Disp: , Rfl:    metFORMIN (GLUCOPHAGE-XR) 500 MG 24 hr tablet, Take 1,000 mg by mouth daily., Disp: , Rfl:    metoprolol  tartrate (LOPRESSOR ) 12.5 mg TABS tablet, Take 12.5 mg by mouth daily., Disp: , Rfl:    oxyCODONE  (OXY IR/ROXICODONE ) 5 MG immediate release tablet, Take 5 mg by mouth every 4 (four) hours as needed., Disp: , Rfl:    pioglitazone (ACTOS) 15 MG tablet, Take 15 mg by mouth daily., Disp: , Rfl:    predniSONE  (DELTASONE ) 50 MG tablet, Take 1 tablet at 13 hours, 7 hours, and 1 hour prior to IV contrast, Disp: 9 tablet, Rfl: 0   simvastatin  (ZOCOR ) 20 MG tablet, Take 20 mg by mouth daily., Disp: , Rfl:    tamsulosin  (FLOMAX ) 0.4 MG CAPS capsule, Take 0.4 mg by mouth daily., Disp: , Rfl:   Vitals   Vitals:   01/15/24 2320 01/16/24 0200 01/16/24 0300  BP: (!) 150/75 134/70 130/76  Pulse: 68 (!) 50 (!) 51  Resp: (!) 22 16 15   Temp: 97.7 F (36.5 C)    TempSrc: Temporal    SpO2: 95% 95% 98%  Weight: 104.3 kg      Body mass index is 32.07 kg/m.   Physical Exam   General: Laying comfortably in bed; in no acute distress.  HENT: Normal oropharynx and mucosa. Normal external appearance of ears and nose.  Neck: Supple, no pain or tenderness  CV: No JVD. No peripheral edema.  Pulmonary: Symmetric Chest rise. Normal respiratory effort.  Abdomen: Soft to touch, non-tender.  Ext: No cyanosis, edema, or deformity  Skin: No rash. Normal palpation of skin.   Musculoskeletal: Normal digits and nails by inspection.  No clubbing.   Neurologic Examination  Mental status/Cognition: Alert, oriented to self, place, month and year, good attention.  Speech/language: Fluent, comprehension intact, object naming intact, repetition intact.  Cranial nerves:   CN II Pupils equal and reactive to light, no VF deficits    CN III,IV,VI EOM intact, no gaze preference or deviation, no nystagmus    CN V normal sensation in V1, V2, and V3 segments bilaterally    CN VII no asymmetry, no nasolabial fold flattening    CN VIII normal hearing to speech    CN IX & X normal  palatal elevation, no uvular deviation    CN XI 5/5 head turn and 5/5 shoulder shrug bilaterally    CN XII midline tongue protrusion    Motor:  Muscle bulk: normal, tone increased on the left. Noted L arm and L leg twitching concerning for seizures. Mvmt Root Nerve  Muscle Right Left Comments  SA C5/6 Ax Deltoid 5 4   EF C5/6 Mc Biceps 5 4   EE C6/7/8 Rad Triceps 5 4   WF C6/7 Med FCR     WE C7/8 PIN ECU     F Ab C8/T1 U ADM/FDI 5 4   HF L1/2/3 Fem Illopsoas 5 3   KE L2/3/4 Fem Quad     DF L4/5 D Peron Tib Ant 5 4   PF S1/2 Tibial Grc/Sol 5 4    Sensation:  Light touch Decreased on the left.   Pin prick    Temperature    Vibration   Proprioception    Coordination/Complex Motor:  - Finger to Nose intact BL - Heel to shin unable to do with LLE - Rapid alternating movement are slowed on the left. - Gait: deferred.  Labs/Imaging/Neurodiagnostic studies   CBC:  Recent Labs  Lab 2024-02-07 2354 01/16/24 0008 01/16/24 0252  WBC 5.9  --  6.9  NEUTROABS 5.1  --   --   HGB 16.3 17.0 16.0  HCT 50.2 50.0 49.8  MCV 81.0  --  81.2  PLT 195  --  193   Basic Metabolic Panel:  Lab Results  Component Value Date   NA 135 01/16/2024   K 4.1 01/16/2024   CO2 23 01/16/2024   GLUCOSE 115 (H) 01/16/2024   BUN 14 01/16/2024   CREATININE 1.17 01/16/2024   CALCIUM 8.7 (L) 01/16/2024   GFRNONAA >60 01/16/2024   GFRAA >60 08/14/2019   Lipid Panel:  No results found for: LDLCALC HgbA1c:  Lab Results  Component Value Date   HGBA1C 6.6 (H) 01/16/2024   Urine Drug Screen:     Component Value Date/Time   LABOPIA NONE DETECTED 01/16/2024 0042   COCAINSCRNUR NONE DETECTED 01/16/2024 0042   LABBENZ NONE DETECTED 01/16/2024 0042   AMPHETMU NONE DETECTED 01/16/2024 0042   THCU NONE DETECTED 01/16/2024 0042   LABBARB NONE DETECTED 01/16/2024 0042    Alcohol Level     Component Value Date/Time   ETH <15 02/07/24 2354   INR  Lab Results  Component Value Date   INR 1.0 07-Feb-2024   APTT  Lab Results  Component Value Date   APTT 24 2024/02/07   AED levels: No results found for: PHENYTOIN, ZONISAMIDE, LAMOTRIGINE, LEVETIRACETA  CT Head without contrast(Personally reviewed): 1. Allowing for modality difference, subdural hematoma of 8 mm thickness underlying the right parietal craniotomy site is unchanged compared to the MRI of 12/22/23. 2. Diffuse white matter hypoattenuation throughout the posterior right hemisphere at the area of tumor resection, with improved mass effect on the right lateral ventricle. No midline shift or hydrocephalus. 3. Chronic gliosis in the left frontal white matter, unchanged.  MRI Brain(Personally reviewed): pending  Neurodiagnostics rEEG:  pending  ASSESSMENT   VIRGINIA FRANCISCO is a 68 y.o. 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  Likely etiology of seizure is known R occipital anaplastic oligodendromglioma.  RECOMMENDATIONS  - stop wellbutrin  - start keppra  1000mg  BID -  routine EEG - discharge in AM if no further seizures overnight. - neurology will be available for any quesitons or concerns. We do not plan to see him inpatient again. - follow up with Dr. Buckley. - continue home  steroids. ______________________________________________________________________    Signed, Rhyder Koegel, MD [facility]

## 2024-01-16 NOTE — Care Management Important Message (Deleted)
 Important Message  Patient Details  Name: Randall Lewis MRN: [id number] Date of Birth: [birth year]   Important Message Given:  Yes - Medicare IM     Claretta Deed 01/16/2024, 3:27 PM

## 2024-01-16 NOTE — Evaluation (Signed)
 Physical Therapy Evaluation Patient Details Name: Randall Lewis MRN: [id number] DOB: 19-May-1956 Today's Date: 01/16/2024  History of Present Illness  68 y.o. male presents to [facility] hospital on [facility]/15/2024 with L sided twitching and AMS. 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  Clinical Impression  Pt presents to PT with deficits in strength, power, and with a significant L visual field cut. Pt is able to ambulate for household distances but bumps into multiple objects on left side due to field cut. PT provides verbal and visual cues to demonstrate scanning to left side and reinforces the importance of this when mobilizing to avoid tripping on objects. Pt will benefit from further PT follow-up in an effort to improve dynamic balance and to reinforce awareness of visual field cut. Outpatient PT is recommended at the time of discharge.        If plan is discharge home, recommend the following: A little help with bathing/dressing/bathroom;Assistance with cooking/housework;Assist for transportation;Help with stairs or ramp for entrance   Can travel by private vehicle        Equipment Recommendations None recommended by PT  Recommendations for Other Services       Functional Status Assessment Patient has had a recent decline in their functional status and demonstrates the ability to make significant improvements in function in a reasonable and predictable amount of time.     Precautions / Restrictions Precautions Precautions: Other (comment);Fall (L visual field cut) Recall of Precautions/Restrictions: Intact Restrictions Weight Bearing Restrictions Per Provider Order: No      Mobility  Bed Mobility Overal bed mobility: Independent                  Transfers Overall transfer level: Independent                      Ambulation/Gait Ambulation/Gait assistance: Supervision Gait  Distance (Feet): 200 Feet Assistive device: None Gait Pattern/deviations: Step-through pattern Gait velocity: reduced Gait velocity interpretation: 1.31 - 2.62 ft/sec, indicative of limited community ambulator   General Gait Details: slowed step-through gait, pt kicks 2 objects with left foot when walking and later bumps into corner of wall when turning to left. PT provides verbal cues and demonstration of the need for visual scanning to increase awareness of left environment  Stairs            Wheelchair Mobility     Tilt Bed    Modified Rankin (Stroke Patients Only)       Balance Overall balance assessment: Needs assistance Sitting-balance support: No upper extremity supported, Feet supported Sitting balance-Leahy Scale: Good     Standing balance support: No upper extremity supported, During functional activity Standing balance-Leahy Scale: Good                               Pertinent Vitals/Pain Pain Assessment Pain Assessment: No/denies pain    Home Living Family/patient expects to be discharged to:: Private residence Living Arrangements: Spouse/significant other Available Help at Discharge: Family;Available PRN/intermittently Type of Home: House Home Access: Stairs to enter Entrance Stairs-Rails: Right;Left;Can reach both Entrance Stairs-Number of Steps: 2 Alternate Level Stairs-Number of Steps: flight Home Layout: Two level;Bed/bath upstairs Home Equipment: Grab bars - tub/shower;Hand held shower head      Prior Function Prior Level of Function : Independent/Modified Independent  Mobility Comments: no AD ADLs Comments: has not been driving; otherwise independent. Pt reports he has been bumping into obstacles on his L since onset visual field cut with resection and radiation to occipital lobe oligodendroglioma     Extremity/Trunk Assessment   Upper Extremity Assessment Upper Extremity Assessment: Defer to OT evaluation     Lower Extremity Assessment Lower Extremity Assessment: Generalized weakness    Cervical / Trunk Assessment Cervical / Trunk Assessment: Normal  Communication   Communication Communication: No apparent difficulties    Cognition Arousal: Alert Behavior During Therapy: WFL for tasks assessed/performed   PT - Cognitive impairments: Awareness                         Following commands: Intact       Cueing Cueing Techniques: Verbal cues     General Comments General comments (skin integrity, edema, etc.): VSS on RA    Exercises     Assessment/Plan    PT Assessment Patient needs continued PT services  PT Problem List Decreased strength;Decreased activity tolerance;Decreased balance;Decreased mobility;Other (comment) (visual field cut)       PT Treatment Interventions Gait training;Balance training;Neuromuscular re-education;Patient/family education    PT Goals (Current goals can be found in the Care Plan section)  Acute Rehab PT Goals Patient Stated Goal: to return to independence PT Goal Formulation: With patient Time For Goal Achievement: 01/30/24 Potential to Achieve Goals: Fair Additional Goals Additional Goal #1: Pt will score >19/24 on the DGI to indicate a reduced risk for falls Additional Goal #2: Pt will independently verbalize and demonstrate visual scanning to left side to improve safety when ambulating due to L visual field cut    Frequency Min 2X/week     Co-evaluation               AM-PAC PT 6 Clicks Mobility  Outcome Measure Help needed turning from your back to your side while in a flat bed without using bedrails?: None Help needed moving from lying on your back to sitting on the side of a flat bed without using bedrails?: None Help needed moving to and from a bed to a chair (including a wheelchair)?: None Help needed standing up from a chair using your arms (e.g., wheelchair or bedside chair)?: None Help needed to walk in  hospital room?: A Little Help needed climbing 3-5 steps with a railing? : A Little 6 Click Score: 22    End of Session Equipment Utilized During Treatment: Gait belt Activity Tolerance: Patient tolerated treatment well Patient left: in bed;with call bell/phone within reach Nurse Communication: Mobility status PT Visit Diagnosis: Other abnormalities of gait and mobility (R26.89);Other symptoms and signs involving the nervous system (R29.898)    Time: 8985-8961 PT Time Calculation (min) (ACUTE ONLY): 24 min   Charges:   PT Evaluation $PT Eval Low Complexity: 1 Low   PT General Charges $$ ACUTE PT VISIT: 1 Visit         Bernardino JINNY Ruth, PT, DPT Acute Rehabilitation Office [phone]   Bernardino JINNY Ruth 01/16/2024, 11:09 AM

## 2024-01-16 NOTE — Procedures (Signed)
 Patient Name: Randall Lewis  MRN: [id number]  Epilepsy Attending: Arlin MALVA Krebs  Referring Physician/Provider: Khaliqdina, Salman, MD  Date: 01/16/2024 Duration: 30.28 mins  Patient history: 68 y.o. 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 EEG to evaluate for seizure.  Level of alertness: Awake, asleep  AEDs during EEG study: LEV  Technical aspects: This EEG study was done with scalp electrodes positioned according to the 10-20 International system of electrode placement. Electrical activity was reviewed with band pass filter of 1-70Hz , sensitivity of 7 uV/mm, display speed of 30mm/sec with a 60Hz  notched filter applied as appropriate. EEG data were recorded continuously and digitally stored.  Video monitoring was available and reviewed as appropriate.  Description: The posterior dominant rhythm consists of 8 Hz activity of moderate voltage (25-35 uV) seen predominantly in posterior head regions, asymmetric ( right<left) and reactive to eye opening and eye closing. Sleep was characterized by vertex waves, sleep spindles (12 to 14 Hz), maximal frontocentral region. EEG showed continuous 3 to 6 Hz theta-delta slowing in right hemisphere, maximal right temporo-parietal region.  Hyperventilation and photic stimulation were not performed.     ABNORMALITY - Continuous slow, right hemisphere, maximal right temporo-parietal region - Background asymmetry, right<left  IMPRESSION: This study is suggestive of cortical dysfunction arising from right hemisphere, maximal right temporo-parietal region likely secondary to underlying structural abnormality. No seizures or epileptiform discharges were seen throughout the recording.  Aishia Barkey O Paw Karstens

## 2024-01-16 NOTE — Progress Notes (Signed)
 Physical Therapy Quick Note  PT has completed initial evaluation.    Overall, patient at supervision assistance level.   PT Follow up recommended: Outpatient PT Equipment recommended:  None recommended Complete evaluation note to follow.     Bernardino JINNY Ruth, PT, DPT Acute Rehabilitation Office [phone]

## 2024-01-16 NOTE — Assessment & Plan Note (Signed)
Body mass index is 31.89 kg/m.

## 2024-01-16 NOTE — Hospital Course (Signed)
 CC: headache, left sided weakness HPI:  Randall Lewis is a 68 y.o. male with prior history of left frontal anaplastic oligodendroglioma status postcraniotomy and resection on 03/2005 status post 6 weeks of radiation therapy and concurrent Temodar  ending on 06/22/2005, diabetes mellitus type 2, hypertension, hyperlipidemia, BPH, sleep apnea who had craniotomy on 04/18/2023 for glioblastoma presently on chemo and Avastin  being followed at [facility] and also Dr. Buckley was brought to the ER after patient was found to have left-sided weakness following which patient had seizure-like activity on the left side at around 9 PM yesterday at home.  Patient has been having headache for the last 24 hours and he tried some Tylenol  and also some leftover oxycodone  the headache eased up little bit.  Later in the evening around 9 PM when he was walking to the bathroom when he had a fall but did not hit his head or lose consciousness.  Patient's wife states he was with him she helped him to the bathroom and while on the commode he started developing left-sided weakness with seizure and EMS was called.  Patient's wife states that over the last 3 weeks he has been having some poor vision in the left eye.   ED Course: In the ER patient was given 2 mg IV Ativan  and also loading dose of Keppra .  CT head shows nothing acute.  Neurology was consulted.  Patient admitted for new onset seizures.  Labs show sodium of 134 creatinine 1.3 WBC 5.9 hemoglobin 16.3.  At the time of my exam patient is mildly somnolent but answers questions appropriately mild weakness in the left lower extremity.  Significant Events: Admitted 01/15/2024 for seizure 01-16-2024 seen in consult by neurology  Admission Labs: Na 134, K 4.9, CO2 of 16, BUN 15, Scr 1.32 WBC 6.9, HgB 16, plt 193  Admission Imaging Studies: CT head Allowing for modality difference, subdural hematoma of 8 mm thickness underlying the right parietal craniotomy site is  unchanged compared to the MRI of 12/22/23. 2. Diffuse white matter hypoattenuation throughout the posterior right hemisphere at the area of tumor resection, with improved mass effect on the right lateral ventricle. No midline shift or hydrocephalus. 3. Chronic gliosis in the left frontal white matter, unchanged.  Significant Labs: A1c 6.6%  Significant Imaging Studies:   Antibiotic Therapy: Anti-infectives (From admission, onward)    None       Procedures: EEG  Consultants: Neurology

## 2024-01-18 ENCOUNTER — Telehealth: Payer: Self-pay | Admitting: Internal Medicine

## 2024-01-18 NOTE — Telephone Encounter (Signed)
 Scheduled appointments per WQ. Talked with the patient and he is aware of the made appointments.

## 2024-01-19 ENCOUNTER — Inpatient Hospital Stay

## 2024-01-19 ENCOUNTER — Inpatient Hospital Stay: Admitting: Internal Medicine

## 2024-01-19 ENCOUNTER — Encounter: Payer: Self-pay | Admitting: Internal Medicine

## 2024-01-19 VITALS — BP 137/75 | HR 59 | Temp 97.5°F | Resp 20 | Wt 230.4 lb

## 2024-01-19 DIAGNOSIS — Z5112 Encounter for antineoplastic immunotherapy: Secondary | ICD-10-CM | POA: Diagnosis not present

## 2024-01-19 DIAGNOSIS — R569 Unspecified convulsions: Secondary | ICD-10-CM

## 2024-01-19 DIAGNOSIS — C719 Malignant neoplasm of brain, unspecified: Secondary | ICD-10-CM

## 2024-01-19 LAB — CBC WITH DIFFERENTIAL (CANCER CENTER ONLY)
Abs Immature Granulocytes: 0.01 K/uL (ref 0.00–0.07)
Basophils Absolute: 0 K/uL (ref 0.0–0.1)
Basophils Relative: 0 %
Eosinophils Absolute: 0 K/uL (ref 0.0–0.5)
Eosinophils Relative: 0 %
HCT: 48.4 % (ref 39.0–52.0)
Hemoglobin: 16.3 g/dL (ref 13.0–17.0)
Immature Granulocytes: 0 %
Lymphocytes Relative: 22 %
Lymphs Abs: 1 K/uL (ref 0.7–4.0)
MCH: 26.3 pg (ref 26.0–34.0)
MCHC: 33.7 g/dL (ref 30.0–36.0)
MCV: 78.2 fL — ABNORMAL LOW (ref 80.0–100.0)
Monocytes Absolute: 0.4 K/uL (ref 0.1–1.0)
Monocytes Relative: 9 %
Neutro Abs: 3 K/uL (ref 1.7–7.7)
Neutrophils Relative %: 69 %
Platelet Count: 208 K/uL (ref 150–400)
RBC: 6.19 MIL/uL — ABNORMAL HIGH (ref 4.22–5.81)
RDW: 17.6 % — ABNORMAL HIGH (ref 11.5–15.5)
WBC Count: 4.4 K/uL (ref 4.0–10.5)
nRBC: 0 % (ref 0.0–0.2)

## 2024-01-19 MED ORDER — SODIUM CHLORIDE 0.9 % IV SOLN
10.0000 mg/kg | Freq: Once | INTRAVENOUS | Status: AC
  Administered 2024-01-19: 1100 mg via INTRAVENOUS
  Filled 2024-01-19: qty 12

## 2024-01-19 MED ORDER — SODIUM CHLORIDE 0.9 % IV SOLN: INTRAVENOUS | Status: DC

## 2024-01-19 NOTE — Progress Notes (Signed)
 [facility] at [facility] [address]  [place], [place] [address] [phone]   Interval Evaluation  Date of Service: 01/19/24 Patient Name: Randall Lewis Patient MRN: [id number] Patient DOB: [birth year] Provider: Arthea MARLA Manns, MD  Identifying Statement:  Randall Lewis is a 68 y.o. male with right frontal glioblastoma    Oncologic History: Anaplastic oligodendroglioma of frontal lobe (CMS/HHS-HCC)  04/16/2005 Surgery  Craniotomy for resection of left frontal lesion. 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 Lost to follow up after 03/18/2010  05/02/2023 Presentation  Presentation to the [facility] at [facility]. Recommendations pending final pathology. Likely XRT/Temozolomide  as this is a high grade glioma. 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 Pathology reveals Infiltrating High Grade Glioma pending final molecular studies for integrated diagnosis.  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  He was in the hospital this past week for new onset seizures.  He describes multiple episodes of left arm and leg shaking, lasting several minutes each, followed by weakness.  He was started on Keppra  1000mg  twice per day, no further events since discharge.  Wellbutrin  was also discontinued. Today he otherwise reports overall stability of previously noted impaired vision on the left side, he has been unable to drive safely as a result of this.  He still reports recognizing faces in people that he doesn't know.  Otherwise his walking is independent.  No headaches or seizures.    H+P (05/19/23) Patient presents for follow up after craniotomy, resection of recurrent primary brain tumor at [facility] on 04/18/23.  He tolerated surgery well, no ill effects.  He is functionally intact, no further headaches, no seizures.  Recently increased blood pressure medication with his PCP.  Has visit with radiation oncology upcoming as well.  Medications: Current Outpatient Medications on File Prior to Visit  Medication Sig Dispense Refill   acetaminophen  (TYLENOL ) 325 MG tablet Take 975 mg by mouth every 6 (six) hours as needed.     amLODipine  (NORVASC ) 5 MG tablet Take 5 mg by mouth daily.     JARDIANCE 25 MG TABS tablet Take 25 mg by mouth daily.     levETIRAcetam  (KEPPRA ) 1000  MG tablet Take 1 tablet (1,000 mg  total) by mouth 2 (two) times daily. 180 tablet 0   lomustine  (GLEOSTINE ) 40 MG capsule Take 5 capsules (200 mg total) by mouth once for 1 dose. Take on an empty stomach 1 hour before or 2 hours after meals. 5 capsule 0   losartan -hydrochlorothiazide  (HYZAAR) 100-12.5 MG tablet Take 1 tablet by mouth daily.     metFORMIN (GLUCOPHAGE-XR) 500 MG 24 hr tablet Take 1,000 mg by mouth daily.     metoprolol  tartrate (LOPRESSOR ) 12.5 mg TABS tablet Take 12.5 mg by mouth daily.     oxyCODONE  (OXY IR/ROXICODONE ) 5 MG immediate release tablet Take 5 mg by mouth every 4 (four) hours as needed.     pioglitazone (ACTOS) 15 MG tablet Take 15 mg by mouth daily.     predniSONE  (DELTASONE ) 50 MG tablet Take 1 tablet at 13 hours, 7 hours, and 1 hour prior to IV contrast 9 tablet 0   simvastatin  (ZOCOR ) 20 MG tablet Take 20 mg by mouth daily.     tamsulosin  (FLOMAX ) 0.4 MG CAPS capsule Take 0.4 mg by mouth daily.     No current facility-administered medications on file prior to visit.    Allergies:  Allergies  Allergen Reactions   Sildenafil Other (See Comments) and Nausea Only   Gadolinium Derivatives Nausea And Vomiting    Per pt , always get sick with gado and never offered anti nausea meds.   Past Medical History:  Past Medical History:  Diagnosis Date   Anxiety    BPH (benign prostatic hyperplasia)    Cancer (HCC) 05/31/2004   Brain tumor, in remission after chemo radiation and surgery   Diabetes mellitus without complication (HCC)    ED (erectile dysfunction)    Heart murmur    History of heart murmur   Hyperlipidemia    Hypertension    Sleep apnea    USES C-PAP   UTI (urinary tract infection)    Past Surgical History:  Past Surgical History:  Procedure Laterality Date   BRAIN SURGERY  2006   BRAIN TUMOR  - CANCER   COLONOSCOPY     CYSTOSCOPY N/A 07/29/2015   Procedure: FLEXIBLE CYSTOSCOPY;  Surgeon: Norleen Seltzer, MD;  Location: [facility];  Service: Urology;  Laterality: N/A;   FOOT BONE  EXCISION  1989   PENILE PROSTHESIS IMPLANT N/A 07/29/2015   Procedure: PENILE PROSTHESIS;  Surgeon: Norleen Seltzer, MD;  Location: [facility];  Service: Urology;  Laterality: N/A;   right knee meniscus repair  2010   THYROID  LOBECTOMY  05/12/2012   Procedure: THYROID  LOBECTOMY;  Surgeon: Krystal CHRISTELLA Spinner, MD;  Location: [facility];  Service: General;  Laterality: Left;  left thyroid  lobectomy   Social History:  Social History   Socioeconomic History   Marital status: Married    Spouse name: [name]   Number of children: 2   Years of education: Not on file   Highest education level: Master's degree (e.g., MA, MS, MEng, MEd, MSW, MBA)  Occupational History   Not on file  Tobacco Use   Smoking status: Former    Current packs/day: 0.00    Average packs/day: 1 pack/day for 23.0 years (23.0 ttl pk-yrs)    Types: Cigarettes    Start date: 04/26/1973    Quit date: 04/26/1996    Years since quitting: 27.7   Smokeless tobacco: Never  Vaping Use   Vaping status: Never Used  Substance and Sexual Activity   Alcohol use: No  Drug use: No   Sexual activity: Not on file  Other Topics Concern   Not on file  Social History Narrative   Lives with wife   Right handed   Caffeine: 2 cups of coffee and 1 glass of tea/soda a day   Social Drivers of Health   Financial Resource Strain: Low Risk  (05/01/2023)   Received from [facility]   Overall Financial Resource Strain (CARDIA)    Difficulty of Paying Living Expenses: Not hard at all  Food Insecurity: No Food Insecurity (01/16/2024)   Hunger Vital Sign    Worried About Running Out of Food in the Last Year: Never true    Ran Out of Food in the Last Year: Never true  Transportation Needs: No Transportation Needs (01/16/2024)   PRAPARE - Administrator, Civil Service (Medical): No    Lack of Transportation (Non-Medical): No  Physical Activity: Not on file  Stress: Not on file  Social Connections: Unknown (01/16/2024)   Social  Connection and Isolation Panel    Frequency of Communication with Friends and Family: More than three times a week    Frequency of Social Gatherings with Friends and Family: More than three times a week    Attends Religious Services: Not on file    Active Member of Clubs or Organizations: Yes    Attends Banker Meetings: More than 4 times per year    Marital Status: Married  Catering manager Violence: Not At Risk (01/16/2024)   Humiliation, Afraid, Rape, and Kick questionnaire    Fear of Current or Ex-Partner: No    Emotionally Abused: No    Physically Abused: No    Sexually Abused: No   Family History:  Family History  Problem Relation Age of Onset   Breast cancer Mother    Cancer Brother 73   Heart attack Brother    Heart attack Brother    Birth defects Son    Colon cancer Neg Hx    Liver disease Neg Hx    Esophageal cancer Neg Hx     Review of Systems: Constitutional: Doesn't report fevers, chills or abnormal weight loss Eyes: Doesn't report blurriness of vision Ears, nose, mouth, throat, and face: Doesn't report sore throat Respiratory: Doesn't report cough, dyspnea or wheezes Cardiovascular: Doesn't report palpitation, chest discomfort  Gastrointestinal:  Doesn't report nausea, constipation, diarrhea GU: Doesn't report incontinence Skin: Doesn't report skin rashes Neurological: Per HPI Musculoskeletal: Doesn't report joint pain Behavioral/Psych: Doesn't report anxiety  Physical Exam: Vitals:   01/19/24 0908  BP: 137/75  Pulse: (!) 59  Resp: 20  Temp: (!) 97.5 F (36.4 C)  SpO2: 96%   KPS: 70 General: Alert, cooperative, pleasant, in no acute distress Head: Normal EENT: No conjunctival injection or scleral icterus.  Lungs: Resp effort normal Cardiac: Regular rate Abdomen: Non-distended abdomen Skin: No rashes cyanosis or petechiae. Extremities: No clubbing or edema  Neurologic Exam: Mental Status: Awake, alert, attentive to examiner.  Oriented to self and environment. Language is fluent with intact comprehension.  Cranial Nerves: Visual acuity is grossly normal. Left hemianopia. Extra-ocular movements intact. No ptosis. Face is symmetric Motor: Tone and bulk are normal. Power is full in both arms and legs. Reflexes are symmetric, no pathologic reflexes present.  Sensory: Intact to light touch Gait: Normal.   Labs: I have reviewed the data as listed    Component Value Date/Time   NA 135 01/16/2024 0252   K 4.1 01/16/2024 0252  CL 104 01/16/2024 0252   CO2 23 01/16/2024 0252   GLUCOSE 115 (H) 01/16/2024 0252   BUN 14 01/16/2024 0252   CREATININE 1.17 01/16/2024 0252   CREATININE 1.30 (H) 12/26/2023 1109   CREATININE 1.31 (H) 02/25/2012 1438   CALCIUM 8.7 (L) 01/16/2024 0252   PROT 6.0 (L) 01/16/2024 0252   ALBUMIN 3.2 (L) 01/16/2024 0252   AST 18 01/16/2024 0252   AST 13 (L) 12/26/2023 1109   ALT 15 01/16/2024 0252   ALT 17 12/26/2023 1109   ALKPHOS 42 01/16/2024 0252   BILITOT 0.8 01/16/2024 0252   BILITOT 0.7 12/26/2023 1109   GFRNONAA >60 01/16/2024 0252   GFRNONAA >60 12/26/2023 1109   GFRNONAA >60 02/25/2012 1438   GFRAA >60 08/14/2019 1438   GFRAA >60 02/25/2012 1438   Lab Results  Component Value Date   WBC 4.4 01/19/2024   NEUTROABS 3.0 01/19/2024   HGB 16.3 01/19/2024   HCT 48.4 01/19/2024   MCV 78.2 (L) 01/19/2024   PLT 208 01/19/2024   Imaging:  EEG adult Result Date: 01/16/2024 Shelton Arlin KIDD, MD     01/16/2024  8:59 AM Patient Name: Randall Lewis MRN: [id number] Epilepsy Attending: Arlin KIDD Shelton Referring Physician/Provider: Khaliqdina, Salman, MD Date: 01/16/2024 Duration: 30.28 mins Patient history: 68 y.o. 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 EEG to evaluate for seizure. Level of alertness: Awake,  asleep AEDs during EEG study: LEV Technical aspects: This EEG study was done with scalp electrodes positioned according to the 10-20 International system of electrode placement. Electrical activity was reviewed with band pass filter of 1-70Hz , sensitivity of 7 uV/mm, display speed of 64mm/sec with a 60Hz  notched filter applied as appropriate. EEG data were recorded continuously and digitally stored.  Video monitoring was available and reviewed as appropriate. Description: The posterior dominant rhythm consists of 8 Hz activity of moderate voltage (25-35 uV) seen predominantly in posterior head regions, asymmetric ( right<left) and reactive to eye opening and eye closing. Sleep was characterized by vertex waves, sleep spindles (12 to 14 Hz), maximal frontocentral region. EEG showed continuous 3 to 6 Hz theta-delta slowing in right hemisphere, maximal right temporo-parietal region. Hyperventilation and photic stimulation were not performed.   ABNORMALITY - Continuous slow, right hemisphere, maximal right temporo-parietal region - Background asymmetry, right<left IMPRESSION: This study is suggestive of cortical dysfunction arising from right hemisphere, maximal right temporo-parietal region likely secondary to underlying structural abnormality. No seizures or epileptiform discharges were seen throughout the recording. Arlin KIDD Shelton   CT HEAD CODE STROKE WO CONTRAST Result Date: 01/15/2024 EXAM: CT HEAD WITHOUT CONTRAST 01/15/2024 11:09:42 PM TECHNIQUE: CT of the head was performed without the administration of intravenous contrast. Automated exposure control, iterative reconstruction, and/or weight based adjustment of the mA/kV was utilized to reduce the radiation dose to as low as reasonably achievable. COMPARISON: 04/02/2023 and 12/22/2023 CLINICAL HISTORY: Neuro deficit, acute, stroke suspected. FINDINGS: BRAIN AND VENTRICLES: Subdural hematoma of 8 mm thickness underlying the right parietal craniotomy site.  Allowing for differences in technique this appears unchanged compared to the MRI of 12/22/2023. Remote left-sided craniotomy. There is diffuse white matter hypoattenuation throughout the posterior right hemisphere at the area of tumor resection. No midline shift. No hydrocephalus. Chronic gliosis in the left frontal white matter is unchanged. Improved mass effect on the right lateral ventricle. ORBITS: No  acute abnormality. SINUSES: No acute abnormality. SOFT TISSUES AND SKULL: No acute soft tissue abnormality. No skull fracture. IMPRESSION: 1. Allowing for modality difference, subdural hematoma of 8 mm thickness underlying the right parietal craniotomy site is unchanged compared to the MRI of 12/22/23. 2. Diffuse white matter hypoattenuation throughout the posterior right hemisphere at the area of tumor resection, with improved mass effect on the right lateral ventricle. No midline shift or hydrocephalus. 3. Chronic gliosis in the left frontal white matter, unchanged. 4. Findings communicated to Dr. [name] at 11:19 pm on 01/15/24. Electronically signed by: Franky Stanford MD 01/15/2024 11:20 PM EDT RP Workstation: [id number]   NM PET Metabolic Brain Result Date: 01/06/2024 CLINICAL DATA:  Glioblastoma status post surgical and radiation therapy. Assess tumor recurrence versus tumor necrosis. EXAM: NM PET METABOLIC BRAIN TECHNIQUE: 9.9 mCi F-18 FDG was injected intravenously. Full-ring PET imaging was performed from the vertex to skull base. CT data was obtained and used for attenuation correction and anatomic localization. FASTING BLOOD GLUCOSE:  Value: 122 mg/dl COMPARISON:  Brain MRI 12/22/2023, 10/07/2023 FINDINGS: IN comparison to contrast MRI 12/22/2023, the majority of the enhancing expanding tissue in the medial RIGHT parietal lobe (medial to the lateral ventricle) does not have hypermetabolic activity. There is a focus of hypermetabolic along the medial border of the enhancing tissue which is moderately  hypermetabolic. This activity is in the vicinity of the smaller enhancing lesion seen on MRI of 10/07/2023. More superiorly there is a rim of mild metabolic activity in the medial parietal lobe which corresponds to high-density rim on comparison MRI and may represent blood product. This activity is mild and not favored high-grade neoplasm. IMPRESSION: 1. Equivocal exam. Focus of metabolic activity along the medial aspect of the enhancing tissue in medial RIGHT parietal lobe corresponds to the smaller lesion described on more remote scan of 10/07/2023. The larger expanding enhancing tissue for the most part is not significantly hypermetabolic. 2. Mild metabolic activity associated superior medial RIGHT parietal tissue is rounded and hyperdense tissue and is not tumor recurrence. Electronically Signed   By: Jackquline Boxer M.D.   On: 01/06/2024 09:27   MR BRAIN W WO CONTRAST Result Date: 12/22/2023 CLINICAL DATA:  Brain/CNS neoplasm, assess treatment response EXAM: MRI HEAD WITHOUT AND WITH CONTRAST TECHNIQUE: Multiplanar, multiecho pulse sequences of the brain and surrounding structures were obtained without and with intravenous contrast. CONTRAST:  10mL GADAVIST  GADOBUTROL  1 MMOL/ML IV SOLN COMPARISON:  MRI of the head dated Oct 07, 2023. FINDINGS: Brain: A centrally necrotic peripherally enhancing lesion within the medial aspect of the right occipital lobe as increased in size in the interim from 20 x 20 x 10 mm to approximately 45 x 29 x 28 mm. There is also more extensive T2 signal hyperintensity within the surrounding occipital, parietal and temporal lobes. There is a new band of restricted diffusion along the medial ependymal surface of the posterior horn of the right lateral ventricle. There is more hemosiderin staining within the peripherally enhancing lesion. There continue to be numerous foci of hemosiderin staining within the frontal lobes bilaterally, particularly on the left. There are chronic  encephalomalacia changes also again demonstrated within the temporal lobe from prior resection. There is no residual tumor evident at the frontal lobe resection site. Vascular: Normal flow voids. Skull and upper cervical spine: Status post right temporoparietal craniotomy and left pterional craniotomy. Sinuses/Orbits: Clear paranasal sinuses.  Normal orbits. Other: None. IMPRESSION: 1. There is been significant interval enlargement of a peripherally enhancing, centrally  necrotic lesion within the right medial occipital lobe. There is evidence of intralesional hemorrhage and worsening surrounding T2 signal hyperintensity. There is also new restricted diffusion along the medial ependymal surface of the posterior horn of the right lateral ventricle. 2. Stable left frontal lobe surgical encephalomalacia changes. Electronically Signed   By: Evalene Coho M.D.   On: 12/22/2023 11:49   Assessment/Plan Glioblastoma, IDH-wildtype (HCC)  Seizures (HCC)  Lamar LITTIE Gosling is clinically stable today, here for cycle #1, day 15 CCNU/avastin .  Fortunately he has not had further seizures since starting the Keppra  1000mg  BID.  PET study which was completed 8/7, demonstrated mixed hyper and hypometabolic signal within active site within right parietal lobe; this may represent active nodular tumor (possibly even stable) within mesial R parietal lobe, with surrounding post-RT inflammation.      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 MRI brain demonstrates marked progression of disease within right occipital lobe, secondary to primary site of disease/resection.  This region was within the high dose radiation field, but degree and timing of changes are most concerning for progression of disease.  Post-RT inflamattory changes are also possible, especially given very rapid rate of growth.    Patient elected to continue with cycle #1 oral CCNU 90mg /m2 q6 weeks and avastin  10mg /kg IV 2q  weeks.  Avastin  will be helpful as concurrent therapy given burden of enhancing tumor and steroid requirement.  We reviewed side effects of CCNU, including fatigue, nausea vomiting, cytopenias, ILD.  We reviewed side effects of avastin , including hypertension, bleeding/clotting events, wound healing impairment.  The patient will have a complete blood count, a comprehensive metabolic panel, and urine protein performed prior to each avastin  infusion. Labs may need to be performed more often. Zofran  will prescribed for home use for nausea/vomiting.    Informed consent was obtained verbally at bedside to proceed with oral chemotherapy and avastin .  Chemotherapy should be held for the following:  ANC less than 1,000  Platelets less than 100,000  LFT or creatinine greater than 2x ULN  If clinical concerns/contraindications develop   Avastin  should be held for the following:  ANC less than 500  Platelets less than 50,000  LFT or creatinine greater than 2x ULN  If clinical concerns/contraindications develop  He understands he is not safe to drive at this time.  We did recommend following through with PT/OT as arranged by the inpatient team.  Keppra  will con't 1000mg  BID.  We ask that Lamar LITTIE Gosling return to clinic in 2 weeks with labs for avastin  infusion, or sooner as needed.  MRI ordered for 02/13/24.  All questions were answered. The patient knows to call the clinic with any problems, questions or concerns. No barriers to learning were detected.  The total time spent in the encounter was 40 minutes and more than 50% was on counseling and review of test results   Arthea MARLA Manns, MD Medical Director of Neuro-Oncology [facility] [place] 01/19/24 9:12 AM

## 2024-01-19 NOTE — Patient Instructions (Signed)
 [facility] - A DEPT OF [facility]  Discharge Instructions: Thank you for choosing [facility] to provide your oncology and hematology care.   If you have a lab appointment with the Cancer Center, please go directly to the Cancer Center and check in at the registration area.   Wear comfortable clothing and clothing appropriate for easy access to any Portacath or PICC line.   We strive to give you quality time with your provider. You may need to reschedule your appointment if you arrive late (15 or more minutes).  Arriving late affects you and other patients whose appointments are after yours.  Also, if you miss three or more appointments without notifying the office, you may be dismissed from the clinic at the provider's discretion.      For prescription refill requests, have your pharmacy contact our office and allow 72 hours for refills to be completed.    Today you received the following chemotherapy and/or immunotherapy agent: Bevacizumab  (Avastin )    To help prevent nausea and vomiting after your treatment, we encourage you to take your nausea medication as directed.  BELOW ARE SYMPTOMS THAT SHOULD BE REPORTED IMMEDIATELY: *FEVER GREATER THAN 100.4 F (38 C) OR HIGHER *CHILLS OR SWEATING *NAUSEA AND VOMITING THAT IS NOT CONTROLLED WITH YOUR NAUSEA MEDICATION *UNUSUAL SHORTNESS OF BREATH *UNUSUAL BRUISING OR BLEEDING *URINARY PROBLEMS (pain or burning when urinating, or frequent urination) *BOWEL PROBLEMS (unusual diarrhea, constipation, pain near the anus) TENDERNESS IN MOUTH AND THROAT WITH OR WITHOUT PRESENCE OF ULCERS (sore throat, sores in mouth, or a toothache) UNUSUAL RASH, SWELLING OR PAIN  UNUSUAL VAGINAL DISCHARGE OR ITCHING   Items with * indicate a potential emergency and should be followed up as soon as possible or go to the Emergency Department if any problems should occur.  Please show the CHEMOTHERAPY ALERT CARD or  IMMUNOTHERAPY ALERT CARD at check-in to the Emergency Department and triage nurse.  Should you have questions after your visit or need to cancel or reschedule your appointment, please contact [facility] - A DEPT OF [facility]  Dept: [phone]  and follow the prompts.  Office hours are 8:00 a.m. to 4:30 p.m. Monday - Friday. Please note that voicemails left after 4:00 p.m. may not be returned until the following business day.  We are closed weekends and major holidays. You have access to a nurse at all times for urgent questions. Please call the main number to the clinic Dept: [phone] and follow the prompts.   For any non-urgent questions, you may also contact your provider using MyChart. We now offer e-Visits for anyone 3 and older to request care online for non-urgent symptoms. For details visit [email or web address].   Also download the MyChart app! Go to the app store, search MyChart, open the app, select [facility], and log in with your MyChart username and password.  Bevacizumab  Injection What is this medication? BEVACIZUMAB  (be va SIZ yoo mab) treats some types of cancer. It works by blocking a protein that causes cancer cells to grow and multiply. This helps to slow or stop the spread of cancer cells. It is a monoclonal antibody. This medicine may be used for other purposes; ask your health care provider or pharmacist if you have questions. COMMON BRAND NAME(S): Alymsys , Avastin , MVASI , Vegzalma, Zirabev  What should I tell my care team before I take this medication? They need to know if you have  any of these conditions: Blood clots Coughing up blood Having or recent surgery Heart failure High blood pressure History of a connection between 2 or more body parts that do not usually connect (fistula) History of a tear in your stomach or intestines Protein in your urine An unusual or allergic reaction to bevacizumab , other medications, foods,  dyes, or preservatives Pregnant or trying to get pregnant Breast-feeding How should I use this medication? This medication is injected into a vein. It is given by your care team in a hospital or clinic setting. Talk to your care team the use of this medication in children. Special care may be needed. Overdosage: If you think you have taken too much of this medicine contact a poison control center or emergency room at once. NOTE: This medicine is only for you. Do not share this medicine with others. What if I miss a dose? Keep appointments for follow-up doses. It is important not to miss your dose. Call your care team if you are unable to keep an appointment. What may interact with this medication? Interactions are not expected. This list may not describe all possible interactions. Give your health care provider a list of all the medicines, herbs, non-prescription drugs, or dietary supplements you use. Also tell them if you smoke, drink alcohol, or use illegal drugs. Some items may interact with your medicine. What should I watch for while using this medication? Your condition will be monitored carefully while you are receiving this medication. You may need blood work while taking this medication. This medication may make you feel generally unwell. This is not uncommon as chemotherapy can affect healthy cells as well as cancer cells. Report any side effects. Continue your course of treatment even though you feel ill unless your care team tells you to stop. This medication may increase your risk to bruise or bleed. Call your care team if you notice any unusual bleeding. Before having surgery, talk to your care team to make sure it is ok. This medication can increase the risk of poor healing of your surgical site or wound. You will need to stop this medication for 28 days before surgery. After surgery, wait at least 28 days before restarting this medication. Make sure the surgical site or wound is  healed enough before restarting this medication. Talk to your care team if questions. Talk to your care team if you may be pregnant. Serious birth defects can occur if you take this medication during pregnancy and for 6 months after the last dose. Contraception is recommended while taking this medication and for 6 months after the last dose. Your care team can help you find the option that works for you. Do not breastfeed while taking this medication and for 6 months after the last dose. This medication can cause infertility. Talk to your care team if you are concerned about your fertility. What side effects may I notice from receiving this medication? Side effects that you should report to your care team as soon as possible: Allergic reactions--skin rash, itching, hives, swelling of the face, lips, tongue, or throat Bleeding--bloody or black, tar-like stools, vomiting blood or brown material that looks like coffee grounds, red or dark brown urine, small red or purple spots on skin, unusual bruising or bleeding Blood clot--pain, swelling, or warmth in the leg, shortness of breath, chest pain Heart attack--pain or tightness in the chest, shoulders, arms, or jaw, nausea, shortness of breath, cold or clammy skin, feeling faint or lightheaded Heart failure--shortness of  breath, swelling of the ankles, feet, or hands, sudden weight gain, unusual weakness or fatigue Increase in blood pressure Infection--fever, chills, cough, sore throat, wounds that don't heal, pain or trouble when passing urine, general feeling of discomfort or being unwell Infusion reactions--chest pain, shortness of breath or trouble breathing, feeling faint or lightheaded Kidney injury--decrease in the amount of urine, swelling of the ankles, hands, or feet Stomach pain that is severe, does not go away, or gets worse Stroke--sudden numbness or weakness of the face, arm, or leg, trouble speaking, confusion, trouble walking, loss of  balance or coordination, dizziness, severe headache, change in vision Sudden and severe headache, confusion, change in vision, seizures, which may be signs of posterior reversible encephalopathy syndrome (PRES) Side effects that usually do not require medical attention (report to your care team if they continue or are bothersome): Back pain Change in taste Diarrhea Dry skin Increased tears Nosebleed This list may not describe all possible side effects. Call your doctor for medical advice about side effects. You may report side effects to FDA at 1-800-FDA-1088. Where should I keep my medication? This medication is given in a hospital or clinic. It will not be stored at home. NOTE: This sheet is a summary. It may not cover all possible information. If you have questions about this medicine, talk to your doctor, pharmacist, or health care provider.  2024 Elsevier/Gold Standard (2021-10-02 00:00:00)

## 2024-01-20 ENCOUNTER — Telehealth: Payer: Self-pay | Admitting: Adult Health

## 2024-01-20 NOTE — Telephone Encounter (Signed)
 Pt  daughter called to cancel appt   Appt Canceled

## 2024-01-23 ENCOUNTER — Ambulatory Visit: Attending: Internal Medicine | Admitting: Physical Therapy

## 2024-01-23 ENCOUNTER — Encounter: Payer: Self-pay | Admitting: Internal Medicine

## 2024-01-23 VITALS — BP 131/83 | HR 67

## 2024-01-23 DIAGNOSIS — R2689 Other abnormalities of gait and mobility: Secondary | ICD-10-CM | POA: Diagnosis present

## 2024-01-23 DIAGNOSIS — M6281 Muscle weakness (generalized): Secondary | ICD-10-CM | POA: Insufficient documentation

## 2024-01-23 DIAGNOSIS — R296 Repeated falls: Secondary | ICD-10-CM | POA: Diagnosis present

## 2024-01-23 DIAGNOSIS — R2681 Unsteadiness on feet: Secondary | ICD-10-CM | POA: Insufficient documentation

## 2024-01-23 NOTE — Therapy (Signed)
 OUTPATIENT PHYSICAL THERAPY NEURO EVALUATION   Patient Name: Randall Lewis MRN: [id number] DOB:09-25-1955, 68 y.o., male Today's Date: 01/23/2024   PCP: Randall Toribio MATSU, MD REFERRING PROVIDER: Laurence Locus, DO  END OF SESSION:  PT End of Session - 01/23/24 0946     Visit Number 1    Number of Visits 13   with eval   Date for PT Re-Evaluation 04/02/24    Authorization Type Medicare    PT Start Time 0945   pt arrived late   PT Stop Time 1015    PT Time Calculation (min) 30 min    Equipment Utilized During Treatment Gait belt    Activity Tolerance Patient tolerated treatment well    Behavior During Therapy WFL for tasks assessed/performed          Past Medical History:  Diagnosis Date   Anxiety    BPH (benign prostatic hyperplasia)    Cancer (HCC) 05/31/2004   Brain tumor, in remission after chemo radiation and surgery   Diabetes mellitus without complication (HCC)    ED (erectile dysfunction)    Heart murmur    History of heart murmur   Hyperlipidemia    Hypertension    Sleep apnea    USES C-PAP   UTI (urinary tract infection)    Past Surgical History:  Procedure Laterality Date   BRAIN SURGERY  2006   BRAIN TUMOR  - CANCER   COLONOSCOPY     CYSTOSCOPY N/A 07/29/2015   Procedure: FLEXIBLE CYSTOSCOPY;  Surgeon: Randall Seltzer, MD;  Location: [facility];  Service: Urology;  Laterality: N/A;   FOOT BONE EXCISION  1989   PENILE PROSTHESIS IMPLANT N/A 07/29/2015   Procedure: PENILE PROSTHESIS;  Surgeon: Randall Seltzer, MD;  Location: [facility];  Service: Urology;  Laterality: N/A;   right knee meniscus repair  2010   THYROID  LOBECTOMY  05/12/2012   Procedure: THYROID  LOBECTOMY;  Surgeon: Randall CHRISTELLA Spinner, MD;  Location: [facility];  Service: General;  Laterality: Left;  left thyroid  lobectomy   Patient Active Problem List   Diagnosis Date Noted   Seizures (HCC) 01/16/2024   Depression 01/16/2024   Essential hypertension 01/16/2024   Diabetes mellitus type 2 in nonobese (HCC)  01/16/2024   Acute metabolic acidosis 01/16/2024   Obesity, Class I, BMI 30-34.9 01/16/2024   Hx of colonic polyps 10/18/2023   Abdominal pain 10/18/2023   Persistent hypersomnia 02/08/2022   Parasomnia associated with another health condition 03/20/2021   Non-restorative sleep 03/20/2021   Nightmares REM-sleep type 02/05/2021   Excessive daytime sleepiness 02/05/2021   OSA on CPAP 02/05/2021   Glioblastoma, IDH-wildtype (HCC) 02/05/2021   Erectile dysfunction 07/29/2015   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 He also reports having some weakness in his L hand and his L leg. He has had several falls over the past 6 months. A week ago Sunday he had 3 falls when he was having his seizure. He reports no injuries with his falls and that his wife usually has to help him back up.  He was initially restricted from driving due to his visual impairments and now that has been extended another 6 months because of his seizure.  His daughter is with him at eval, he lives with his wife but she does have to work Print[employer]) during the day so he will be home alone for periods of time. Family asking about if he is safe to shower alone-recommended he have someone there the first few times.  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  HPI per ED note 01/15/2024: Randall Lewis is a 68 y.o. male with prior history of left frontal anaplastic oligodendroglioma status postcraniotomy and resection on 03/2005 status post 6 weeks of radiation therapy and concurrent Temodar  ending on 06/22/2005, diabetes mellitus type 2, hypertension, hyperlipidemia, BPH, sleep apnea who had craniotomy on 04/18/2023 for glioblastoma presently on chemo and Avastin  being followed at [facility] and also Dr. Buckley was brought to the ER after patient was found to have left-sided weakness following which patient had seizure-like activity on the left side at around 9 PM yesterday at home.  Patient has been having headache for the last 24 hours and he tried some Tylenol  and also some leftover oxycodone  the headache eased up little bit.  Later in the evening around 9 PM when he was walking to the bathroom when he had a fall but did not hit his head or lose consciousness.  Patient's wife states he was with him she helped him to the bathroom and while on the commode he started developing left-sided weakness with seizure and EMS was called.  Patient's wife states that over the last 3 weeks he has been having some poor vision in the left eye.  PAIN:  Are you having pain? No  PRECAUTIONS: Fall and Other: seizures  RED FLAGS: None   WEIGHT BEARING RESTRICTIONS: No  FALLS: Has patient fallen in last 6 months? Yes. Number of falls too many to count, had 3 on 8/17 when he had his seizure that led to ED visit  LIVING ENVIRONMENT: Lives with: lives with their spouse (but she works during the day) Lives in: House/apartment Stairs: Yes: Internal: 12 steps; on right going up and External: 2-4 steps; on right going up Has following equipment at home: None  PLOF: Independent with gait,  Independent with transfers, and in July the visual impairments started to get more prominent  PATIENT GOALS: 100% recovery  OBJECTIVE:  Note: Objective measures were completed at Evaluation unless otherwise noted.  DIAGNOSTIC FINDINGS: ***  COGNITION: Overall cognitive status: minor cognitive impairments per patient report   SENSATION: WFL in BLE No N/T per patient report  COORDINATION: WFL in BLE  EDEMA:  None  POSTURE: rounded shoulders and forward head  LOWER EXTREMITY ROM:     Active  Right Eval Left Eval  Hip flexion    Hip extension    Hip abduction    Hip adduction  Hip internal rotation    Hip external rotation    Knee flexion    Knee extension Tight HS Tight HS  Ankle dorsiflexion    Ankle plantarflexion    Ankle inversion    Ankle eversion     (Blank rows = not tested)  LOWER EXTREMITY MMT:    MMT Right Eval Left Eval  Hip flexion 5 5  Hip extension    Hip abduction    Hip adduction    Hip internal rotation    Hip external rotation    Knee flexion 5 5  Knee extension 5 5  Ankle dorsiflexion 5 5  Ankle plantarflexion    Ankle inversion    Ankle eversion    (Blank rows = not tested)  BED MOBILITY:  Mod I per report  TRANSFERS: Sit to stand: Modified independence  Assistive device utilized: None     Stand to sit: Modified independence  Assistive device utilized: None     Chair to chair: Modified independence  Assistive device utilized: None       RAMP:  Not tested  CURB:  Not tested  STAIRS:  See FGA GAIT: Findings:  Gait pattern: {gait characteristics:25376} Distance walked: various clinic distances Assistive device utilized: None Level of assistance: SBA Comments: SBA and decreased gait speed due to visual impairments   FUNCTIONAL TESTS:    [facility] PT Assessment - 01/23/24 0959       Ambulation/Gait   Gait velocity 32.8 ft over 12.8 sec = 2.56 ft/sec      Standardized Balance Assessment   Standardized Balance  Assessment Timed Up and Go Test;Five Times Sit to Stand    Five times sit to stand comments  20.75 sec   no UE     Timed Up and Go Test   TUG Normal TUG    Normal TUG (seconds) 13.4   no AD     Functional Gait  Assessment   Gait assessed  Yes    Gait Level Surface Walks 20 ft, slow speed, abnormal gait pattern, evidence for imbalance or deviates 10-15 in outside of the 12 in walkway width. Requires more than 7 sec to ambulate 20 ft.    Change in Gait Speed Able to change speed, demonstrates mild gait deviations, deviates 6-10 in outside of the 12 in walkway width, or no gait deviations, unable to achieve a major change in velocity, or uses a change in velocity, or uses an assistive device.    Gait with Horizontal Head Turns Performs head turns smoothly with slight change in gait velocity (eg, minor disruption to smooth gait path), deviates 6-10 in outside 12 in walkway width, or uses an assistive device.    Gait with Vertical Head Turns Performs task with slight change in gait velocity (eg, minor disruption to smooth gait path), deviates 6 - 10 in outside 12 in walkway width or uses assistive device    Gait and Pivot Turn Pivot turns safely in greater than 3 sec and stops with no loss of balance, or pivot turns safely within 3 sec and stops with mild imbalance, requires small steps to catch balance.    Step Over Obstacle Is able to step over one shoe box (4.5 in total height) but must slow down and adjust steps to clear box safely. May require verbal cueing.    Gait with Narrow Base of Support Ambulates less than 4 steps heel to toe or cannot perform without assistance.    Gait with Eyes Closed  Walks 20 ft, slow speed, abnormal gait pattern, evidence for imbalance, deviates 10-15 in outside 12 in walkway width. Requires more than 9 sec to ambulate 20 ft.    Ambulating Backwards Walks 20 ft, uses assistive device, slower speed, mild gait deviations, deviates 6-10 in outside 12 in walkway width.     Steps Alternating feet, must use rail.    Total Score 15    FGA comment: 15/30, high fall risk                                                                                                                                       TREATMENT DATE: PT Evaluation  Self-Care/Home Management Vitals:   01/23/24 0957  BP: 131/83  Pulse: 67      PATIENT EDUCATION: Education details: Eval findings, PT POC, results of OM and functional implications, focus of PT on addressing balance impairments but we cannot fix his visual impairments Person educated: Patient and Child(ren) Education method: Medical illustrator Education comprehension: verbalized understanding, returned demonstration, and needs further education  HOME EXERCISE PROGRAM: To be initiated  GOALS: Goals reviewed with patient? Yes  SHORT TERM GOALS: Target date: 02/13/2024    *** Baseline: Goal status: INITIAL  2.  *** Baseline:  Goal status: INITIAL  3.  *** Baseline:  Goal status: INITIAL  4.  *** Baseline:  Goal status: INITIAL  5.  *** Baseline:  Goal status: INITIAL  6.  *** Baseline:  Goal status: INITIAL  LONG TERM GOALS: Target date: 03/05/2024    *** Baseline:  Goal status: INITIAL  2.  *** Baseline:  Goal status: INITIAL  3.  *** Baseline:  Goal status: INITIAL  4.  *** Baseline:  Goal status: INITIAL  5.  *** Baseline:  Goal status: INITIAL  6.  *** Baseline:  Goal status: INITIAL  ASSESSMENT:  CLINICAL IMPRESSION: Evaluation limited by patient's late arrival. Patient is a *** year old *** referred to Neuro OPPT for***.   Pt's PMH is significant for: *** The following deficits were present during the exam: ***. Based on ***, pt is an incr risk for falls. Pt would benefit from skilled PT to address these impairments and functional limitations to maximize functional mobility independence.   OBJECTIVE IMPAIRMENTS: {opptimpairments:25111}.   ACTIVITY  LIMITATIONS: {activitylimitations:27494}  PARTICIPATION LIMITATIONS: {participationrestrictions:25113}  PERSONAL FACTORS: {Personal factors:25162} are also affecting patient's functional outcome.   REHAB POTENTIAL: {rehabpotential:25112}  CLINICAL DECISION MAKING: {clinical decision making:25114}  EVALUATION COMPLEXITY: High  PLAN:  PT FREQUENCY: 2x/week  PT DURATION: 6 weeks  PLANNED INTERVENTIONS: {rehab planned interventions:25118::97110-Therapeutic exercises,97530- Therapeutic [phone]- Neuromuscular re-education,97535- Self Rjmz,02859- Manual therapy}  PLAN FOR NEXT SESSION: ***assess visual field impairments further   Waddell Southgate, PT Waddell Southgate, PT, DPT, CSRS  01/23/2024, 10:18 AM

## 2024-01-24 ENCOUNTER — Other Ambulatory Visit: Payer: Self-pay

## 2024-01-27 ENCOUNTER — Other Ambulatory Visit: Payer: Self-pay

## 2024-01-27 ENCOUNTER — Other Ambulatory Visit: Payer: Self-pay | Admitting: Internal Medicine

## 2024-01-27 DIAGNOSIS — C719 Malignant neoplasm of brain, unspecified: Secondary | ICD-10-CM

## 2024-01-31 ENCOUNTER — Ambulatory Visit: Attending: Internal Medicine

## 2024-01-31 ENCOUNTER — Other Ambulatory Visit: Payer: Self-pay

## 2024-01-31 DIAGNOSIS — R2689 Other abnormalities of gait and mobility: Secondary | ICD-10-CM | POA: Insufficient documentation

## 2024-01-31 DIAGNOSIS — R29898 Other symptoms and signs involving the musculoskeletal system: Secondary | ICD-10-CM | POA: Insufficient documentation

## 2024-01-31 DIAGNOSIS — R41842 Visuospatial deficit: Secondary | ICD-10-CM | POA: Insufficient documentation

## 2024-01-31 DIAGNOSIS — R278 Other lack of coordination: Secondary | ICD-10-CM | POA: Diagnosis present

## 2024-01-31 DIAGNOSIS — R296 Repeated falls: Secondary | ICD-10-CM | POA: Insufficient documentation

## 2024-01-31 DIAGNOSIS — R2681 Unsteadiness on feet: Secondary | ICD-10-CM | POA: Diagnosis present

## 2024-01-31 DIAGNOSIS — M6281 Muscle weakness (generalized): Secondary | ICD-10-CM | POA: Insufficient documentation

## 2024-01-31 DIAGNOSIS — R29818 Other symptoms and signs involving the nervous system: Secondary | ICD-10-CM | POA: Insufficient documentation

## 2024-01-31 NOTE — Therapy (Unsigned)
 OUTPATIENT PHYSICAL THERAPY NEURO TREATMENT   Patient Name: Randall Lewis MRN: [id number] DOB:10-May-1956, 68 y.o., male Today's Date: 01/31/2024   PCP: Yolande Toribio MATSU, MD REFERRING PROVIDER: Laurence Locus, DO  END OF SESSION:  PT End of Session - 01/31/24 1451     Visit Number 2    Number of Visits 13    Date for PT Re-Evaluation 04/02/24    Authorization Type Medicare    PT Start Time 1449   patient late   PT Stop Time 1530    PT Time Calculation (min) 41 min    Activity Tolerance Patient tolerated treatment well    Behavior During Therapy [facility] for tasks assessed/performed          Past Medical History:  Diagnosis Date   Anxiety    BPH (benign prostatic hyperplasia)    Cancer (HCC) 05/31/2004   Brain tumor, in remission after chemo radiation and surgery   Diabetes mellitus without complication (HCC)    ED (erectile dysfunction)    Heart murmur    History of heart murmur   Hyperlipidemia    Hypertension    Sleep apnea    USES C-PAP   UTI (urinary tract infection)    Past Surgical History:  Procedure Laterality Date   BRAIN SURGERY  2006   BRAIN TUMOR  - CANCER   COLONOSCOPY     CYSTOSCOPY N/A 07/29/2015   Procedure: FLEXIBLE CYSTOSCOPY;  Surgeon: Norleen Seltzer, MD;  Location: [facility];  Service: Urology;  Laterality: N/A;   FOOT BONE EXCISION  1989   PENILE PROSTHESIS IMPLANT N/A 07/29/2015   Procedure: PENILE PROSTHESIS;  Surgeon: Norleen Seltzer, MD;  Location: [facility];  Service: Urology;  Laterality: N/A;   right knee meniscus repair  2010   THYROID  LOBECTOMY  05/12/2012   Procedure: THYROID  LOBECTOMY;  Surgeon: Krystal CHRISTELLA Spinner, MD;  Location: [facility];  Service: General;  Laterality: Left;  left thyroid  lobectomy   Patient Active Problem List   Diagnosis Date Noted   Seizures (HCC) 01/16/2024   Depression 01/16/2024   Essential hypertension 01/16/2024   Diabetes mellitus type 2 in nonobese (HCC) 01/16/2024   Acute metabolic acidosis 01/16/2024   Obesity, Class  I, BMI 30-34.9 01/16/2024   Hx of colonic polyps 10/18/2023   Abdominal pain 10/18/2023   Persistent hypersomnia 02/08/2022   Parasomnia associated with another health condition 03/20/2021   Non-restorative sleep 03/20/2021   Nightmares REM-sleep type 02/05/2021   Excessive daytime sleepiness 02/05/2021   OSA on CPAP 02/05/2021   Glioblastoma, IDH-wildtype (HCC) 02/05/2021   Erectile dysfunction 07/29/2015   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 Reports doing well. No acute changes. Denies falls.   Pt accompanied by: family member daughter [name]  PERTINENT HISTORY: PMH includes R medial occipital lobe anaplastic oligodendroglioma s/p resection and chemo radiation, anxiety, BPH, DM, HTN, HLD.  HPI per ED note 01/15/2024: Randall Lewis is a 68 y.o. male with prior history of left frontal anaplastic oligodendroglioma status postcraniotomy and resection on 03/2005 status post 6 weeks of radiation therapy and concurrent Temodar  ending on 06/22/2005, diabetes mellitus type 2, hypertension, hyperlipidemia, BPH, sleep apnea who had craniotomy on 04/18/2023 for glioblastoma presently on chemo and Avastin  being followed at [facility] and also Dr. Buckley was brought to the ER after patient was found to have left-sided weakness following which patient had seizure-like  activity on the left side at around 9 PM yesterday at home.  Patient has been having headache for the last 24 hours and he tried some Tylenol  and also some leftover oxycodone  the headache eased up little bit.  Later in the evening around 9 PM when he was walking to the bathroom when he had a fall but did not hit his head or lose consciousness.  Patient's wife states he was with him she helped him to the bathroom and while on the commode he started developing left-sided weakness with seizure and EMS was called.  Patient's wife states that over the last 3 weeks he has been having some poor vision in the left eye.  PAIN:  Are you having pain? No  PRECAUTIONS: Fall and Other: seizures  RED FLAGS: None   WEIGHT BEARING RESTRICTIONS: No  FALLS: Has patient fallen in last 6 months? Yes. Number of falls too many to count, had 3 on 8/17 when he had his seizure that led to ED visit  LIVING ENVIRONMENT: Lives with: lives with their spouse (but she works during the day) Lives in: House/apartment Stairs: Yes: Internal: 12 steps; on right going up and External: 2-4 steps; on right going up Has following equipment at home: None  PLOF: Independent with gait, Independent with transfers, and in July the visual impairments started to get more prominent  PATIENT GOALS: 100% recovery  OBJECTIVE:  Note: Objective measures were completed at Evaluation unless otherwise noted.  DIAGNOSTIC FINDINGS:   Head CT 01/15/2024 IMPRESSION: 1. Allowing for modality difference, subdural hematoma of 8 mm thickness underlying the right parietal craniotomy site is unchanged compared to the MRI of 12/22/23. 2. Diffuse white matter hypoattenuation throughout the posterior right hemisphere at the area of tumor resection, with improved mass effect on the right lateral ventricle. No midline shift or hydrocephalus. 3. Chronic gliosis in the left frontal white matter, unchanged. 4. Findings communicated to Dr. Sal  Khaliqdina at 11:19 pm on 01/15/24.  COGNITION: Overall cognitive status: minor cognitive impairments per patient report  TREATMENT  Theract: -Discussed impact edema has had on vision -assessed vision and patient has L homonymous hemianopsia  -discussed consistent need for compensation for visual loss -balance implications of visual deficits  -Spot It! Standing with fluctuating Min to Max cues needed to scan to find matching symbols -simple alternating attention task with cog + motor  -prioritizing motor with blaze pods taps vs determining which playing card had higher value  -ambulatory visual scanning to retrieve playing cards from hallway with noted appropriate visual sweeping   PATIENT EDUCATION: Education details: see above, Peli lenses for [facility] Person educated: Patient and Child(ren) Education method: Medical illustrator Education comprehension: verbalized understanding, returned demonstration, and needs further education  HOME EXERCISE PROGRAM: To be initiated  GOALS: Goals reviewed with patient? Yes  SHORT TERM GOALS: Target date: 02/13/2024   Pt will be independent with initial HEP for improved strength, balance, transfers and gait. Baseline: Goal status: INITIAL  2.  Pt will improve 5 x STS to less than or equal to 17 seconds to demonstrate improved functional strength and transfer efficiency.  Baseline: 20.75 sec no UE (8/25) Goal status: INITIAL  3.  Pt will improve gait velocity to at least 2.75 ft/sec for improved gait efficiency and performance at SBA level  Baseline: 2.56 ft/sec no AD SBA (8/25) Goal status: INITIAL  4.  Pt will improve FGA to 19/30 for decreased fall risk  Baseline: 15/30 (8/25) Goal status: INITIAL    LONG TERM GOALS: Target date: 03/05/2024    Pt will be independent with final HEP for improved  strength, balance, transfers and gait. Baseline:  Goal status: INITIAL  2.  Pt will improve 5 x STS to less than or equal to 14 seconds to demonstrate improved functional strength and transfer efficiency.  Baseline: 20.75 sec no UE (8/25) Goal status: INITIAL  3.  Pt will improve gait velocity to at least 3.0 ft/sec for improved gait efficiency and performance at mod I level  Baseline:  2.56 ft/sec no AD SBA (8/25) Goal status: INITIAL  4.  Pt will improve FGA to 23/30 for decreased fall risk  Baseline: 15/30 (8/25) Goal status: INITIAL  5.  Pt will demonstrate good ability to compensate for his visual impairments in order to decrease his fall risk and be able to name 2 strategies for compensation. Baseline: visual impairments not assessed at eval due to time constraints Goal status: INITIAL    ASSESSMENT:  CLINICAL IMPRESSION: Patient seen for skilled PT session with emphasis on further assessing vision and greater practice with visual scanning to compensate for visual loss. Given functional strength and static balance, it is likely that his dynamic and ambulatory balance and mostly impaired due to his HH and lack of consistent compensation for it. Walking straight, predictable paths, such as an empty hallway, he is able to visually scan and maintain his balance. More unpredictable scenarios lead to mild instability. Continue POC.    OBJECTIVE IMPAIRMENTS: Abnormal gait, decreased balance, decreased cognition, decreased knowledge of condition, decreased knowledge of use of DME, difficulty walking, decreased strength, impaired perceived functional ability, and impaired vision/preception.   ACTIVITY LIMITATIONS: stairs and bathing  PARTICIPATION LIMITATIONS: meal prep, cleaning, laundry, driving, and community activity  PERSONAL FACTORS: Time since onset of injury/illness/exacerbation, Transportation, and 3+ comorbidities:  R medial occipital lobe anaplastic oligodendroglioma s/p  resection and chemo radiation, anxiety, BPH, DM, HTN, HLD.are also affecting patient's functional outcome.   REHAB POTENTIAL: Good  CLINICAL DECISION MAKING: Unstable/unpredictable  EVALUATION COMPLEXITY: High  PLAN:  PT FREQUENCY: 2x/week  PT DURATION: 6 weeks  PLANNED INTERVENTIONS: 97164- PT Re-evaluation, 97750- Physical Performance Testing, 97110-Therapeutic exercises, 97530- Therapeutic activity, 97112- Neuromuscular re-education, 97535- Self Care, 02859- Manual therapy, [phone]- Gait training, [phone] (1-2 muscles), 20561 (3+ muscles)- Dry Needling, Patient/Family education, Balance training, Stair training, Taping, Joint mobilization, Spinal mobilization, Vestibular training, Visual/preceptual remediation/compensation, Cognitive remediation, DME instructions, Cryotherapy, and Moist heat  PLAN FOR NEXT SESSION: seizures,  initiate HEP to work on functional strengthening and balance, work on Metallurgist for L visual field impairments, simple dual tasking    Delon DELENA Pop, PT Delon DELENA Pop, PT, DPT, CBIS  01/31/2024, 4:14 PM

## 2024-02-02 ENCOUNTER — Inpatient Hospital Stay

## 2024-02-02 ENCOUNTER — Inpatient Hospital Stay: Attending: Internal Medicine | Admitting: Internal Medicine

## 2024-02-02 ENCOUNTER — Other Ambulatory Visit: Payer: Self-pay

## 2024-02-02 VITALS — BP 148/88 | HR 57 | Temp 98.0°F | Resp 13 | Wt 225.4 lb

## 2024-02-02 DIAGNOSIS — Z803 Family history of malignant neoplasm of breast: Secondary | ICD-10-CM | POA: Diagnosis not present

## 2024-02-02 DIAGNOSIS — E119 Type 2 diabetes mellitus without complications: Secondary | ICD-10-CM | POA: Diagnosis not present

## 2024-02-02 DIAGNOSIS — R112 Nausea with vomiting, unspecified: Secondary | ICD-10-CM | POA: Insufficient documentation

## 2024-02-02 DIAGNOSIS — Z923 Personal history of irradiation: Secondary | ICD-10-CM | POA: Insufficient documentation

## 2024-02-02 DIAGNOSIS — Z87440 Personal history of urinary (tract) infections: Secondary | ICD-10-CM | POA: Insufficient documentation

## 2024-02-02 DIAGNOSIS — C711 Malignant neoplasm of frontal lobe: Secondary | ICD-10-CM | POA: Diagnosis not present

## 2024-02-02 DIAGNOSIS — C719 Malignant neoplasm of brain, unspecified: Secondary | ICD-10-CM

## 2024-02-02 DIAGNOSIS — Z87891 Personal history of nicotine dependence: Secondary | ICD-10-CM | POA: Insufficient documentation

## 2024-02-02 DIAGNOSIS — Z7984 Long term (current) use of oral hypoglycemic drugs: Secondary | ICD-10-CM | POA: Insufficient documentation

## 2024-02-02 DIAGNOSIS — Z5112 Encounter for antineoplastic immunotherapy: Secondary | ICD-10-CM | POA: Diagnosis present

## 2024-02-02 DIAGNOSIS — I10 Essential (primary) hypertension: Secondary | ICD-10-CM | POA: Diagnosis not present

## 2024-02-02 DIAGNOSIS — E785 Hyperlipidemia, unspecified: Secondary | ICD-10-CM | POA: Insufficient documentation

## 2024-02-02 DIAGNOSIS — R569 Unspecified convulsions: Secondary | ICD-10-CM | POA: Diagnosis not present

## 2024-02-02 DIAGNOSIS — G4730 Sleep apnea, unspecified: Secondary | ICD-10-CM | POA: Diagnosis not present

## 2024-02-02 DIAGNOSIS — Z79899 Other long term (current) drug therapy: Secondary | ICD-10-CM | POA: Insufficient documentation

## 2024-02-02 DIAGNOSIS — N400 Enlarged prostate without lower urinary tract symptoms: Secondary | ICD-10-CM | POA: Diagnosis not present

## 2024-02-02 LAB — CBC WITH DIFFERENTIAL (CANCER CENTER ONLY)
Abs Immature Granulocytes: 0.01 K/uL (ref 0.00–0.07)
Basophils Absolute: 0 K/uL (ref 0.0–0.1)
Basophils Relative: 1 %
Eosinophils Absolute: 0 K/uL (ref 0.0–0.5)
Eosinophils Relative: 0 %
HCT: 53.1 % — ABNORMAL HIGH (ref 39.0–52.0)
Hemoglobin: 17.3 g/dL — ABNORMAL HIGH (ref 13.0–17.0)
Immature Granulocytes: 0 %
Lymphocytes Relative: 23 %
Lymphs Abs: 0.7 K/uL (ref 0.7–4.0)
MCH: 25.7 pg — ABNORMAL LOW (ref 26.0–34.0)
MCHC: 32.6 g/dL (ref 30.0–36.0)
MCV: 79 fL — ABNORMAL LOW (ref 80.0–100.0)
Monocytes Absolute: 0.2 K/uL (ref 0.1–1.0)
Monocytes Relative: 7 %
Neutro Abs: 2.1 K/uL (ref 1.7–7.7)
Neutrophils Relative %: 69 %
Platelet Count: 130 K/uL — ABNORMAL LOW (ref 150–400)
RBC: 6.72 MIL/uL — ABNORMAL HIGH (ref 4.22–5.81)
RDW: 17 % — ABNORMAL HIGH (ref 11.5–15.5)
WBC Count: 3.1 K/uL — ABNORMAL LOW (ref 4.0–10.5)
nRBC: 0 % (ref 0.0–0.2)

## 2024-02-02 LAB — TOTAL PROTEIN, URINE DIPSTICK: Protein, ur: NEGATIVE mg/dL

## 2024-02-02 MED ORDER — SODIUM CHLORIDE 0.9 % IV SOLN
10.0000 mg/kg | Freq: Once | INTRAVENOUS | Status: AC
  Administered 2024-02-02: 1100 mg via INTRAVENOUS
  Filled 2024-02-02: qty 12

## 2024-02-02 MED ORDER — SODIUM CHLORIDE 0.9 % IV SOLN: INTRAVENOUS | Status: DC

## 2024-02-02 MED ORDER — PREDNISONE 50 MG PO TABS: ORAL_TABLET | ORAL | 0 refills | Status: DC

## 2024-02-02 NOTE — Progress Notes (Signed)
 [facility] [address]  [place], [place] [address] [phone]   Interval Evaluation  Date of Service: 02/02/24 Patient Name: Randall Lewis Patient MRN: [id number] Patient DOB: 04-06-1956 Provider: Arthea MARLA Manns, MD  Identifying Statement:  Randall Lewis is a 68 y.o. male with right frontal glioblastoma    Oncologic History: Anaplastic oligodendroglioma of frontal lobe (CMS/HHS-HCC)  04/16/2005 Surgery  Craniotomy for resection of left frontal lesion. 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 Lost to follow up after 03/18/2010  05/02/2023 Presentation  Presentation to the [facility]. Recommendations pending final pathology. Likely XRT/Temozolomide  as this is a high grade glioma. 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 Pathology reveals Infiltrating High Grade Glioma pending final molecular studies for integrated diagnosis.  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  No further seizures since prior visit, he continues on Keppra  1000mg  twice per day.  Wellbutrin  remains discontinued. Today he otherwise reports overall stability of previously noted impaired vision on the left side, he has been unable to drive safely as a result of this.  He still reports recognizing faces in people that he doesn't know.  Otherwise his walking is independent.  No headaches or seizures.    H+P (05/19/23) Patient presents for follow up after craniotomy, resection of recurrent primary brain tumor at [facility] on 04/18/23.  He tolerated surgery well, no ill effects.  He is functionally intact, no further headaches, no seizures.  Recently increased blood pressure medication with his PCP.  Has visit with radiation oncology upcoming as well.  Medications: Current Outpatient Medications on File Prior to Visit  Medication Sig Dispense Refill   acetaminophen  (TYLENOL ) 325 MG tablet Take 975 mg by mouth every 6 (six) hours as needed.     amLODipine  (NORVASC ) 5 MG tablet Take 5 mg by mouth daily.     JARDIANCE 25 MG TABS tablet Take 25 mg by mouth daily.     levETIRAcetam  (KEPPRA ) 1000 MG tablet Take 1 tablet (1,000 mg total) by mouth 2 (two) times daily. 180 tablet 0   losartan -hydrochlorothiazide  (HYZAAR) 100-12.5 MG tablet Take 1 tablet by mouth daily.  metFORMIN (GLUCOPHAGE-XR) 500 MG 24  hr tablet Take 1,000 mg by mouth daily.     metoprolol  tartrate (LOPRESSOR ) 12.5 mg TABS tablet Take 12.5 mg by mouth daily.     oxyCODONE  (OXY IR/ROXICODONE ) 5 MG immediate release tablet Take 5 mg by mouth every 4 (four) hours as needed.     pioglitazone (ACTOS) 15 MG tablet Take 15 mg by mouth daily.     predniSONE  (DELTASONE ) 50 MG tablet Take 1 tablet at 13 hours, 7 hours, and 1 hour prior to IV contrast 9 tablet 0   simvastatin  (ZOCOR ) 20 MG tablet Take 20 mg by mouth daily.     lomustine  (GLEOSTINE ) 40 MG capsule Take 5 capsules (200 mg total) by mouth once for 1 dose. Take on an empty stomach 1 hour before or 2 hours after meals. (Patient not taking: Reported on 02/02/2024) 5 capsule 0   tamsulosin  (FLOMAX ) 0.4 MG CAPS capsule Take 0.4 mg by mouth daily. (Patient not taking: Reported on 02/02/2024)     No current facility-administered medications on file prior to visit.    Allergies:  Allergies  Allergen Reactions   Sildenafil Other (See Comments) and Nausea Only   Gadolinium Derivatives Nausea And Vomiting    Per pt , always get sick with gado and never offered anti nausea meds.   Past Medical History:  Past Medical History:  Diagnosis Date   Anxiety    BPH (benign prostatic hyperplasia)    Cancer (HCC) 05/31/2004   Brain tumor, in remission after chemo radiation and surgery   Diabetes mellitus without complication (HCC)    ED (erectile dysfunction)    Heart murmur    History of heart murmur   Hyperlipidemia    Hypertension    Sleep apnea    USES C-PAP   UTI (urinary tract infection)    Past Surgical History:  Past Surgical History:  Procedure Laterality Date   BRAIN SURGERY  2006   BRAIN TUMOR  - CANCER   COLONOSCOPY     CYSTOSCOPY N/A 07/29/2015   Procedure: FLEXIBLE CYSTOSCOPY;  Surgeon: Norleen Seltzer, MD;  Location: [facility];  Service: Urology;  Laterality: N/A;   FOOT BONE EXCISION  1989   PENILE PROSTHESIS IMPLANT N/A 07/29/2015   Procedure: PENILE PROSTHESIS;   Surgeon: Norleen Seltzer, MD;  Location: [facility];  Service: Urology;  Laterality: N/A;   right knee meniscus repair  2010   THYROID  LOBECTOMY  05/12/2012   Procedure: THYROID  LOBECTOMY;  Surgeon: Krystal CHRISTELLA Spinner, MD;  Location: [facility];  Service: General;  Laterality: Left;  left thyroid  lobectomy   Social History:  Social History   Socioeconomic History   Marital status: Married    Spouse name: [name]   Number of children: 2   Years of education: Not on file   Highest education level: Master's degree (e.g., MA, MS, MEng, MEd, MSW, MBA)  Occupational History   Not on file  Tobacco Use   Smoking status: Former    Current packs/day: 0.00    Average packs/day: 1 pack/day for 23.0 years (23.0 ttl pk-yrs)    Types: Cigarettes    Start date: 04/26/1973    Quit date: 04/26/1996    Years since quitting: 27.7   Smokeless tobacco: Never  Vaping Use   Vaping status: Never Used  Substance and Sexual Activity   Alcohol use: No   Drug use: No   Sexual activity: Not on file  Other Topics Concern   Not on file  Social History Narrative   Lives with wife   Right handed   Caffeine: 2 cups of coffee and 1 glass of tea/soda a day   Social Drivers of Health   Financial Resource Strain: Low Risk  (05/01/2023)   Received from [facility]   Overall Financial Resource Strain (CARDIA)    Difficulty of Paying Living Expenses: Not hard at all  Food Insecurity: No Food Insecurity (01/16/2024)   Hunger Vital Sign    Worried About Running Out of Food in the Last Year: Never true    Ran Out of Food in the Last Year: Never true  Transportation Needs: No Transportation Needs (01/16/2024)   PRAPARE - Administrator, Civil Service (Medical): No    Lack of Transportation (Non-Medical): No  Physical Activity: Not on file  Stress: Not on file  Social Connections: Unknown (01/16/2024)   Social Connection and Isolation Panel    Frequency of Communication with Friends and Family: More  than three times a week    Frequency of Social Gatherings with Friends and Family: More than three times a week    Attends Religious Services: Not on file    Active Member of Clubs or Organizations: Yes    Attends Banker Meetings: More than 4 times per year    Marital Status: Married  Catering manager Violence: Not At Risk (01/16/2024)   Humiliation, Afraid, Rape, and Kick questionnaire    Fear of Current or Ex-Partner: No    Emotionally Abused: No    Physically Abused: No    Sexually Abused: No   Family History:  Family History  Problem Relation Age of Onset   Breast cancer Mother    Cancer Brother 36   Heart attack Brother    Heart attack Brother    Birth defects Son    Colon cancer Neg Hx    Liver disease Neg Hx    Esophageal cancer Neg Hx     Review of Systems: Constitutional: Doesn't report fevers, chills or abnormal weight loss Eyes: Doesn't report blurriness of vision Ears, nose, mouth, throat, and face: Doesn't report sore throat Respiratory: Doesn't report cough, dyspnea or wheezes Cardiovascular: Doesn't report palpitation, chest discomfort  Gastrointestinal:  Doesn't report nausea, constipation, diarrhea GU: Doesn't report incontinence Skin: Doesn't report skin rashes Neurological: Per HPI Musculoskeletal: Doesn't report joint pain Behavioral/Psych: Doesn't report anxiety  Physical Exam: Vitals:   02/02/24 1110  BP: (!) 148/88  Pulse: (!) 57  Resp: 13  Temp: 98 F (36.7 C)  SpO2: 99%   KPS: 70 General: Alert, cooperative, pleasant, in no acute distress Head: Normal EENT: No conjunctival injection or scleral icterus.  Lungs: Resp effort normal Cardiac: Regular rate Abdomen: Non-distended abdomen Skin: No rashes cyanosis or petechiae. Extremities: No clubbing or edema  Neurologic Exam: Mental Status: Awake, alert, attentive to examiner. Oriented to self and environment. Language is fluent with intact comprehension.  Cranial Nerves:  Visual acuity is grossly normal. Left hemianopia. Extra-ocular movements intact. No ptosis. Face is symmetric Motor: Tone and bulk are normal. Power is full in both arms and legs. Reflexes are symmetric, no pathologic reflexes present.  Sensory: Intact to light touch Gait: Normal.   Labs: I have reviewed the data as listed    Component Value Date/Time   NA 135 01/16/2024 0252   K 4.1 01/16/2024 0252   CL 104 01/16/2024 0252   CO2 23 01/16/2024 0252   GLUCOSE 115 (H) 01/16/2024 0252  BUN 14 01/16/2024 0252   CREATININE 1.17 01/16/2024 0252   CREATININE 1.30 (H) 12/26/2023 1109   CREATININE 1.31 (H) 02/25/2012 1438   CALCIUM 8.7 (L) 01/16/2024 0252   PROT 6.0 (L) 01/16/2024 0252   ALBUMIN 3.2 (L) 01/16/2024 0252   AST 18 01/16/2024 0252   AST 13 (L) 12/26/2023 1109   ALT 15 01/16/2024 0252   ALT 17 12/26/2023 1109   ALKPHOS 42 01/16/2024 0252   BILITOT 0.8 01/16/2024 0252   BILITOT 0.7 12/26/2023 1109   GFRNONAA >60 01/16/2024 0252   GFRNONAA >60 12/26/2023 1109   GFRNONAA >60 02/25/2012 1438   GFRAA >60 08/14/2019 1438   GFRAA >60 02/25/2012 1438   Lab Results  Component Value Date   WBC 3.1 (L) 02/02/2024   NEUTROABS 2.1 02/02/2024   HGB 17.3 (H) 02/02/2024   HCT 53.1 (H) 02/02/2024   MCV 79.0 (L) 02/02/2024   PLT 130 (L) 02/02/2024   Imaging:  EEG adult Result Date: 01/16/2024 Shelton Arlin KIDD, MD     01/16/2024  8:59 AM Patient Name: YACINE GARRIGA MRN: [id number] Epilepsy Attending: Arlin KIDD Shelton Referring Physician/Provider: Khaliqdina, Salman, MD Date: 01/16/2024 Duration: 30.28 mins Patient history: 68 y.o. 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 EEG to evaluate for seizure. Level of alertness: Awake, asleep AEDs during EEG study: LEV Technical aspects: This EEG study was done with  scalp electrodes positioned according to the 10-20 International system of electrode placement. Electrical activity was reviewed with band pass filter of 1-70Hz , sensitivity of 7 uV/mm, display speed of 23mm/sec with a 60Hz  notched filter applied as appropriate. EEG data were recorded continuously and digitally stored.  Video monitoring was available and reviewed as appropriate. Description: The posterior dominant rhythm consists of 8 Hz activity of moderate voltage (25-35 uV) seen predominantly in posterior head regions, asymmetric ( right<left) and reactive to eye opening and eye closing. Sleep was characterized by vertex waves, sleep spindles (12 to 14 Hz), maximal frontocentral region. EEG showed continuous 3 to 6 Hz theta-delta slowing in right hemisphere, maximal right temporo-parietal region. Hyperventilation and photic stimulation were not performed.   ABNORMALITY - Continuous slow, right hemisphere, maximal right temporo-parietal region - Background asymmetry, right<left IMPRESSION: This study is suggestive of cortical dysfunction arising from right hemisphere, maximal right temporo-parietal region likely secondary to underlying structural abnormality. No seizures or epileptiform discharges were seen throughout the recording. Arlin KIDD Shelton   CT HEAD CODE STROKE WO CONTRAST Result Date: 01/15/2024 EXAM: CT HEAD WITHOUT CONTRAST 01/15/2024 11:09:42 PM TECHNIQUE: CT of the head was performed without the administration of intravenous contrast. Automated exposure control, iterative reconstruction, and/or weight based adjustment of the mA/kV was utilized to reduce the radiation dose to as low as reasonably achievable. COMPARISON: 04/02/2023 and 12/22/2023 CLINICAL HISTORY: Neuro deficit, acute, stroke suspected. FINDINGS: BRAIN AND VENTRICLES: Subdural hematoma of 8 mm thickness underlying the right parietal craniotomy site. Allowing for differences in technique this appears unchanged compared to the MRI of  12/22/2023. Remote left-sided craniotomy. There is diffuse white matter hypoattenuation throughout the posterior right hemisphere at the area of tumor resection. No midline shift. No hydrocephalus. Chronic gliosis in the left frontal white matter is unchanged. Improved mass effect on the right lateral ventricle. ORBITS: No acute abnormality. SINUSES: No acute abnormality. SOFT TISSUES AND SKULL: No acute soft tissue abnormality.  No skull fracture. IMPRESSION: 1. Allowing for modality difference, subdural hematoma of 8 mm thickness underlying the right parietal craniotomy site is unchanged compared to the MRI of 12/22/23. 2. Diffuse white matter hypoattenuation throughout the posterior right hemisphere at the area of tumor resection, with improved mass effect on the right lateral ventricle. No midline shift or hydrocephalus. 3. Chronic gliosis in the left frontal white matter, unchanged. 4. Findings communicated to Dr. [name] at 11:19 pm on 01/15/24. Electronically signed by: Franky Stanford MD 01/15/2024 11:20 PM EDT RP Workstation: [id number]   NM PET Metabolic Brain Result Date: 01/06/2024 CLINICAL DATA:  Glioblastoma status post surgical and radiation therapy. Assess tumor recurrence versus tumor necrosis. EXAM: NM PET METABOLIC BRAIN TECHNIQUE: 9.9 mCi F-18 FDG was injected intravenously. Full-ring PET imaging was performed from the vertex to skull base. CT data was obtained and used for attenuation correction and anatomic localization. FASTING BLOOD GLUCOSE:  Value: 122 mg/dl COMPARISON:  Brain MRI 12/22/2023, 10/07/2023 FINDINGS: IN comparison to contrast MRI 12/22/2023, the majority of the enhancing expanding tissue in the medial RIGHT parietal lobe (medial to the lateral ventricle) does not have hypermetabolic activity. There is a focus of hypermetabolic along the medial border of the enhancing tissue which is moderately hypermetabolic. This activity is in the vicinity of the smaller enhancing lesion  seen on MRI of 10/07/2023. More superiorly there is a rim of mild metabolic activity in the medial parietal lobe which corresponds to high-density rim on comparison MRI and may represent blood product. This activity is mild and not favored high-grade neoplasm. IMPRESSION: 1. Equivocal exam. Focus of metabolic activity along the medial aspect of the enhancing tissue in medial RIGHT parietal lobe corresponds to the smaller lesion described on more remote scan of 10/07/2023. The larger expanding enhancing tissue for the most part is not significantly hypermetabolic. 2. Mild metabolic activity associated superior medial RIGHT parietal tissue is rounded and hyperdense tissue and is not tumor recurrence. Electronically Signed   By: Jackquline Boxer M.D.   On: 01/06/2024 09:27   Assessment/Plan Glioblastoma, IDH-wildtype (HCC)  Seizures (HCC)  Lamar LITTIE Gosling is clinically stable today, here for cycle #1, day 29 CCNU/avastin .  Labs are within normal limits.    Patient elected to continue with cycle #1 oral CCNU 90mg /m2 q6 weeks and avastin  10mg /kg IV 2q weeks.  Avastin  will be helpful as concurrent therapy given burden of enhancing tumor and steroid requirement.  We reviewed side effects of CCNU, including fatigue, nausea vomiting, cytopenias, ILD.  We reviewed side effects of avastin , including hypertension, bleeding/clotting events, wound healing impairment.  The patient will have a complete blood count, a comprehensive metabolic panel, and urine protein performed prior to each avastin  infusion. Labs may need to be performed more often. Zofran  will prescribed for home use for nausea/vomiting.    Informed consent was obtained verbally at bedside to proceed with oral chemotherapy and avastin .  Chemotherapy should be held for the following:  ANC less than 1,000  Platelets less than 100,000  LFT or creatinine greater than 2x ULN  If clinical concerns/contraindications develop   Avastin  should be held  for the following:  ANC less than 500  Platelets less than 50,000  LFT or creatinine greater than 2x ULN  If clinical concerns/contraindications develop  He understands he is not safe to drive at this time.  We did recommend following through with PT/OT as arranged by the inpatient team.  Also placed opatholmology referral today.  Keppra  will con't 1000mg   BID.  We ask that Lamar LITTIE Gosling return to clinic in 2 weeks with labs following MRI prior to cycle #2, or sooner as needed.    All questions were answered. The patient knows to call the clinic with any problems, questions or concerns. No barriers to learning were detected.  The total time spent in the encounter was 30 minutes and more than 50% was on counseling and review of test results   Arthea MARLA Manns, MD Medical Director of Neuro-Oncology [facility] at [facility] 02/02/24 11:29 AM

## 2024-02-02 NOTE — Patient Instructions (Signed)
 [facility] - A DEPT OF [facility]  Discharge Instructions: Thank you for choosing [facility] to provide your oncology and hematology care.   If you have a lab appointment with the Cancer Center, please go directly to the Cancer Center and check in at the registration area.   Wear comfortable clothing and clothing appropriate for easy access to any Portacath or PICC line.   We strive to give you quality time with your provider. You may need to reschedule your appointment if you arrive late (15 or more minutes).  Arriving late affects you and other patients whose appointments are after yours.  Also, if you miss three or more appointments without notifying the office, you may be dismissed from the clinic at the provider's discretion.      For prescription refill requests, have your pharmacy contact our office and allow 72 hours for refills to be completed.    Today you received the following chemotherapy and/or immunotherapy agents: bevacizumab      To help prevent nausea and vomiting after your treatment, we encourage you to take your nausea medication as directed.  BELOW ARE SYMPTOMS THAT SHOULD BE REPORTED IMMEDIATELY: *FEVER GREATER THAN 100.4 F (38 C) OR HIGHER *CHILLS OR SWEATING *NAUSEA AND VOMITING THAT IS NOT CONTROLLED WITH YOUR NAUSEA MEDICATION *UNUSUAL SHORTNESS OF BREATH *UNUSUAL BRUISING OR BLEEDING *URINARY PROBLEMS (pain or burning when urinating, or frequent urination) *BOWEL PROBLEMS (unusual diarrhea, constipation, pain near the anus) TENDERNESS IN MOUTH AND THROAT WITH OR WITHOUT PRESENCE OF ULCERS (sore throat, sores in mouth, or a toothache) UNUSUAL RASH, SWELLING OR PAIN  UNUSUAL VAGINAL DISCHARGE OR ITCHING   Items with * indicate a potential emergency and should be followed up as soon as possible or go to the Emergency Department if any problems should occur.  Please show the CHEMOTHERAPY ALERT CARD or  IMMUNOTHERAPY ALERT CARD at check-in to the Emergency Department and triage nurse.  Should you have questions after your visit or need to cancel or reschedule your appointment, please contact [facility] - A DEPT OF [facility]  Dept: [phone]  and follow the prompts.  Office hours are 8:00 a.m. to 4:30 p.m. Monday - Friday. Please note that voicemails left after 4:00 p.m. may not be returned until the following business day.  We are closed weekends and major holidays. You have access to a nurse at all times for urgent questions. Please call the main number to the clinic Dept: [phone] and follow the prompts.   For any non-urgent questions, you may also contact your provider using MyChart. We now offer e-Visits for anyone 81 and older to request care online for non-urgent symptoms. For details visit [email or web address].   Also download the MyChart app! Go to the app store, search "MyChart", open the app, select [facility], and log in with your MyChart username and password.

## 2024-02-03 ENCOUNTER — Other Ambulatory Visit: Payer: Self-pay

## 2024-02-03 ENCOUNTER — Other Ambulatory Visit (HOSPITAL_COMMUNITY): Payer: Self-pay

## 2024-02-03 ENCOUNTER — Other Ambulatory Visit: Payer: Self-pay | Admitting: Internal Medicine

## 2024-02-03 DIAGNOSIS — C719 Malignant neoplasm of brain, unspecified: Secondary | ICD-10-CM

## 2024-02-03 NOTE — Progress Notes (Signed)
 Specialty Pharmacy Ongoing Clinical Assessment Note  Randall Lewis is a 68 y.o. male who is being followed by the specialty pharmacy service for RxSp Oncology   Patient's specialty medication(s) reviewed today: Lomustine  (GLEOSTINE )   Missed doses in the last 4 weeks: 0   Patient/Caregiver did not have any additional questions or concerns.   Therapeutic benefit summary: Patient is achieving benefit   Adverse events/side effects summary: No adverse events/side effects   Patient's therapy is appropriate to: Continue    Goals Addressed             This Visit's Progress    Slow Disease Progression       Patient is unable to be assessed as therapy was recently initiated. Patient will maintain adherence.         Follow up: 3 months  [facility]

## 2024-02-06 ENCOUNTER — Other Ambulatory Visit: Payer: Self-pay | Admitting: Pharmacy Technician

## 2024-02-06 ENCOUNTER — Other Ambulatory Visit: Payer: Self-pay

## 2024-02-06 ENCOUNTER — Telehealth: Payer: Self-pay | Admitting: Internal Medicine

## 2024-02-06 ENCOUNTER — Encounter: Payer: Self-pay | Admitting: Internal Medicine

## 2024-02-06 NOTE — Telephone Encounter (Signed)
 Patient needs to have lab and md visit prior to refilling oral chemo.

## 2024-02-06 NOTE — Progress Notes (Signed)
 9.8 Called and spoke to patient that Gleostine  Rx was denied. Patient needs appt. Patient reported that appt is on 02/13/24.

## 2024-02-06 NOTE — Telephone Encounter (Signed)
 Scheduled appointments per WQ. Talked with the patient and he is aware of the made appointments.

## 2024-02-07 ENCOUNTER — Ambulatory Visit: Admitting: Physical Therapy

## 2024-02-07 ENCOUNTER — Encounter: Payer: Self-pay | Admitting: Physical Therapy

## 2024-02-07 ENCOUNTER — Other Ambulatory Visit: Payer: Self-pay

## 2024-02-07 VITALS — BP 130/77 | HR 65

## 2024-02-07 DIAGNOSIS — R296 Repeated falls: Secondary | ICD-10-CM

## 2024-02-07 DIAGNOSIS — R2689 Other abnormalities of gait and mobility: Secondary | ICD-10-CM

## 2024-02-07 DIAGNOSIS — M6281 Muscle weakness (generalized): Secondary | ICD-10-CM

## 2024-02-07 DIAGNOSIS — R2681 Unsteadiness on feet: Secondary | ICD-10-CM

## 2024-02-07 NOTE — Therapy (Signed)
 OUTPATIENT PHYSICAL THERAPY NEURO TREATMENT   Patient Name: Randall Lewis MRN: [id number] DOB:1955/09/27, 68 y.o., male Today's Date: 02/07/2024   PCP: Randall Toribio MATSU, MD REFERRING PROVIDER: Laurence Locus, DO  END OF SESSION:  PT End of Session - 02/07/24 1619     Visit Number 3    Number of Visits 13    Date for PT Re-Evaluation 04/02/24    Authorization Type Medicare    PT Start Time 1616    PT Stop Time 1658    PT Time Calculation (min) 42 min    Equipment Utilized During Treatment Gait belt    Activity Tolerance Patient tolerated treatment well    Behavior During Therapy WFL for tasks assessed/performed          Past Medical History:  Diagnosis Date   Anxiety    BPH (benign prostatic hyperplasia)    Cancer (HCC) 05/31/2004   Brain tumor, in remission after chemo radiation and surgery   Diabetes mellitus without complication (HCC)    ED (erectile dysfunction)    Heart murmur    History of heart murmur   Hyperlipidemia    Hypertension    Sleep apnea    USES C-PAP   UTI (urinary tract infection)    Past Surgical History:  Procedure Laterality Date   BRAIN SURGERY  2006   BRAIN TUMOR  - CANCER   COLONOSCOPY     CYSTOSCOPY N/A 07/29/2015   Procedure: FLEXIBLE CYSTOSCOPY;  Surgeon: Randall Seltzer, MD;  Location: [facility];  Service: Urology;  Laterality: N/A;   FOOT BONE EXCISION  1989   PENILE PROSTHESIS IMPLANT N/A 07/29/2015   Procedure: PENILE PROSTHESIS;  Surgeon: Randall Seltzer, MD;  Location: [facility];  Service: Urology;  Laterality: N/A;   right knee meniscus repair  2010   THYROID  LOBECTOMY  05/12/2012   Procedure: THYROID  LOBECTOMY;  Surgeon: Randall CHRISTELLA Spinner, MD;  Location: [facility];  Service: General;  Laterality: Left;  left thyroid  lobectomy   Patient Active Problem List   Diagnosis Date Noted   Seizures (HCC) 01/16/2024   Depression 01/16/2024   Essential hypertension 01/16/2024   Diabetes mellitus type 2 in nonobese (HCC) 01/16/2024   Acute metabolic  acidosis 01/16/2024   Obesity, Class I, BMI 30-34.9 01/16/2024   Hx of colonic polyps 10/18/2023   Abdominal pain 10/18/2023   Persistent hypersomnia 02/08/2022   Parasomnia associated with another health condition 03/20/2021   Non-restorative sleep 03/20/2021   Nightmares REM-sleep type 02/05/2021   Excessive daytime sleepiness 02/05/2021   OSA on CPAP 02/05/2021   Glioblastoma, IDH-wildtype (HCC) 02/05/2021   Erectile dysfunction 07/29/2015   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 No changes, no falls. Notes hits wall frequency. 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  HPI per ED note 01/15/2024: Randall Lewis is a 68 y.o. male with prior history of left frontal anaplastic oligodendroglioma status postcraniotomy and resection on 03/2005 status post 6 weeks of radiation therapy and concurrent Temodar  ending on 06/22/2005, diabetes mellitus type 2, hypertension, hyperlipidemia, BPH, sleep apnea who had craniotomy on 04/18/2023 for glioblastoma presently on chemo and Avastin  being followed at [facility] and also Randall Lewis was brought to the ER after  patient was found to have left-sided weakness following which patient had seizure-like activity on the left side at around 9 PM yesterday at home.  Patient has been having headache for the last 24 hours and he tried some Tylenol  and also some leftover oxycodone  the headache eased up little bit.  Later in the evening around 9 PM when he was walking to the bathroom when he had a fall but did not hit his head or lose consciousness.  Patient's wife states he was with him she helped him to the bathroom and while on the commode he started developing left-sided weakness with seizure and EMS was called.  Patient's wife states that over the last 3 weeks he has been having some poor vision in the left eye.  PAIN:  Are you having pain? No  PRECAUTIONS: Fall and Other: seizures  RED FLAGS: None   WEIGHT BEARING RESTRICTIONS: No  FALLS: Has patient fallen in last 6 months? Yes. Number of falls too many to count, had 3 on 8/17 when he had his seizure that led to ED visit  LIVING ENVIRONMENT: Lives with: lives with their spouse (but she works during the day) Lives in: House/apartment Stairs: Yes: Internal: 12 steps; on right going up and External: 2-4 steps; on right going up Has following equipment at home: None  PLOF: Independent with gait, Independent with transfers, and in July the visual impairments started to get more prominent  PATIENT GOALS: 100% recovery  OBJECTIVE:  Note: Objective measures were completed at Evaluation unless otherwise noted.  DIAGNOSTIC FINDINGS:   Head CT 01/15/2024 IMPRESSION: 1. Allowing for modality difference, subdural hematoma of 8 mm thickness underlying the right parietal craniotomy site is unchanged compared to the MRI of 12/22/23. 2. Diffuse white matter hypoattenuation throughout the posterior right hemisphere at the area of tumor resection, with improved mass effect on the right lateral ventricle. No midline shift or hydrocephalus. 3. Chronic gliosis in  the left frontal white matter, unchanged. 4. Findings communicated to Dr. Sal Lewis at 11:19 pm on 01/15/24.  COGNITION: Overall cognitive status: minor cognitive impairments per patient report  TREATMENT   Therapeutic Activity: Vitals:   02/07/24 1624  BP: 130/77  Pulse: 65   Access Code: [email or web address] URL: [email or web address] Date: 02/07/2024 Prepared by: Randall Lewis  Initiated HEP for functional strength/balance, see MedBridge for more details:   Exercises - Sit to Stand  - 1-2 x daily - 4-5 x weekly - 2 sets - 10 reps - Romberg Stance Eyes Closed on Foam Pad  - 1-2 x daily - 4-5 x weekly - 3 sets - 30 hold - Romberg Stance on Foam Pad with Head Rotation  - 1-2 x daily - 4-5 x weekly - 2 sets - 10 reps and 10 reps head nods  - Walking with Head Rotation  - 1-2 x daily - 4-5 x weekly - 3 sets - next to countertop/hallway, cues for full ROM  - Standing Marching  - 1-2 x daily - 4-5 x weekly - 3 sets - next to countertop, focus on slowed pace and holding for a few seconds   In // bars: Alternating SLS taps to 2 cones 20 reps, then performed an additional 20 reps on air ex, with pt more unsteady on LLE, trying to perform with no UE support  Alternating forward stepping strategy 20 reps (when stepping back on holding leg in the air for a couple of seconds for more SLS stability)  PATIENT EDUCATION: Education details: Initial HEP  Person educated: Patient Education method: Explanation, Demonstration, Verbal cues, and Handouts Education comprehension: verbalized understanding, returned demonstration, and needs further education  HOME EXERCISE PROGRAM: Access Code: [email or web address] URL: [email or web address] Date: 02/07/2024 Prepared by: Randall Lewis  Exercises - Sit to Stand  - 1-2 x daily - 4-5 x weekly - 2 sets - 10  reps - Romberg Stance Eyes Closed on Foam Pad  - 1-2 x daily - 4-5 x weekly - 3 sets - 30 hold - Romberg Stance on Foam Pad with Head Rotation  - 1-2 x daily - 4-5 x weekly - 2 sets - 10 reps and 10 reps head nods  - Walking with Head Rotation  - 1-2 x daily - 4-5 x weekly - 3 sets - Standing Marching  - 1-2 x daily - 4-5 x weekly - 3 sets  GOALS: Goals reviewed with patient? Yes  SHORT TERM GOALS: Target date: 02/13/2024   Pt will be independent with initial HEP for improved strength, balance, transfers and gait. Baseline: Goal status: INITIAL  2.  Pt will improve 5 x STS to less than or equal to 17 seconds to demonstrate improved functional strength and transfer efficiency.  Baseline: 20.75 sec no UE (8/25) Goal status: INITIAL  3.  Pt will improve gait velocity to at least 2.75 ft/sec for improved gait efficiency and performance at SBA level  Baseline: 2.56 ft/sec no AD SBA (8/25) Goal status: INITIAL  4.  Pt will improve FGA to 19/30 for decreased fall risk  Baseline: 15/30 (8/25) Goal status: INITIAL    LONG TERM GOALS: Target date: 03/05/2024    Pt will be independent with final HEP for improved strength, balance, transfers and gait. Baseline:  Goal status: INITIAL  2.  Pt will improve 5 x STS to less than or equal to 14 seconds to demonstrate improved functional strength and transfer efficiency.  Baseline: 20.75 sec no UE (8/25) Goal status: INITIAL  3.  Pt will improve gait velocity to at least 3.0 ft/sec for improved gait efficiency and performance at mod I level  Baseline:  2.56 ft/sec no AD SBA (  8/25) Goal status: INITIAL  4.  Pt will improve FGA to 23/30 for decreased fall risk  Baseline: 15/30 (8/25) Goal status: INITIAL  5.  Pt will demonstrate good ability to compensate for his visual impairments in order to decrease his fall risk and be able to name 2 strategies for compensation. Baseline: visual impairments not assessed at eval due to time  constraints Goal status: INITIAL    ASSESSMENT:  CLINICAL IMPRESSION: Today's skilled session focused on initiating HEP for functional strength/balance. Added gait with head turns to HEP to work on compensating for visual loss. When working on SLS tasks, pt more unsteady when standing on LLE. Will continue per POC.    OBJECTIVE IMPAIRMENTS: Abnormal gait, decreased balance, decreased cognition, decreased knowledge of condition, decreased knowledge of use of DME, difficulty walking, decreased strength, impaired perceived functional ability, and impaired vision/preception.   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   REHAB POTENTIAL: Good  CLINICAL DECISION MAKING: Unstable/unpredictable  EVALUATION COMPLEXITY: High  PLAN:  PT FREQUENCY: 2x/week  PT DURATION: 6 weeks  PLANNED INTERVENTIONS: 02835- PT Re-evaluation, 97750- Physical Performance Testing, 97110-Therapeutic exercises, 97530- Therapeutic activity, 97112- Neuromuscular re-education, 97535- Self Care, 02859- Manual therapy, [phone]- Gait training, [phone] (1-2 muscles), [phone] (3+ muscles)- Dry Needling, Patient/Family education, Balance training, Stair training, Taping, Joint mobilization, Spinal mobilization, Vestibular training, Visual/preceptual remediation/compensation, Cognitive remediation, DME instructions, Cryotherapy, and Moist heat  PLAN FOR NEXT SESSION: seizures, add to HEP as needed, work on compensations for L visual field impairments, simple dual tasking, work on balance tasks/functional strengthening    Randall Lewis, PT, DPT 02/07/24 5:19  PM

## 2024-02-08 ENCOUNTER — Other Ambulatory Visit: Payer: Self-pay

## 2024-02-08 ENCOUNTER — Telehealth: Payer: Self-pay | Admitting: *Deleted

## 2024-02-08 NOTE — Telephone Encounter (Signed)
 Patient referral, demographic sheet & office note from 02/02/24 faxed to [facility], [phone].  Fax confirmation received.

## 2024-02-09 ENCOUNTER — Ambulatory Visit

## 2024-02-09 VITALS — BP 140/86 | HR 73

## 2024-02-09 DIAGNOSIS — M6281 Muscle weakness (generalized): Secondary | ICD-10-CM

## 2024-02-09 DIAGNOSIS — R296 Repeated falls: Secondary | ICD-10-CM

## 2024-02-09 DIAGNOSIS — R2689 Other abnormalities of gait and mobility: Secondary | ICD-10-CM

## 2024-02-09 DIAGNOSIS — R2681 Unsteadiness on feet: Secondary | ICD-10-CM

## 2024-02-09 NOTE — Therapy (Signed)
 OUTPATIENT PHYSICAL THERAPY NEURO TREATMENT   Patient Name: Randall Lewis MRN: [id number] DOB:03-21-1956, 68 y.o., male Today's Date: 02/09/2024   PCP: Randall Toribio MATSU, MD REFERRING PROVIDER: Laurence Locus, DO  END OF SESSION:  PT End of Session - 02/09/24 1442     Visit Number 4    Number of Visits 13    Date for PT Re-Evaluation 04/02/24    Authorization Type Medicare    Progress Note Due on Visit 10    PT Start Time 1445    PT Stop Time 1530    PT Time Calculation (min) 45 min    Equipment Utilized During Treatment Gait belt    Activity Tolerance Patient tolerated treatment well    Behavior During Therapy WFL for tasks assessed/performed          Past Medical History:  Diagnosis Date   Anxiety    BPH (benign prostatic hyperplasia)    Cancer (HCC) 05/31/2004   Brain tumor, in remission after chemo radiation and surgery   Diabetes mellitus without complication (HCC)    ED (erectile dysfunction)    Heart murmur    History of heart murmur   Hyperlipidemia    Hypertension    Sleep apnea    USES C-PAP   UTI (urinary tract infection)    Past Surgical History:  Procedure Laterality Date   BRAIN SURGERY  2006   BRAIN TUMOR  - CANCER   COLONOSCOPY     CYSTOSCOPY N/A 07/29/2015   Procedure: FLEXIBLE CYSTOSCOPY;  Surgeon: Randall Seltzer, MD;  Location: [facility];  Service: Urology;  Laterality: N/A;   FOOT BONE EXCISION  1989   PENILE PROSTHESIS IMPLANT N/A 07/29/2015   Procedure: PENILE PROSTHESIS;  Surgeon: Randall Seltzer, MD;  Location: [facility];  Service: Urology;  Laterality: N/A;   right knee meniscus repair  2010   THYROID  LOBECTOMY  05/12/2012   Procedure: THYROID  LOBECTOMY;  Surgeon: Randall CHRISTELLA Spinner, MD;  Location: [facility];  Service: General;  Laterality: Left;  left thyroid  lobectomy   Patient Active Problem List   Diagnosis Date Noted   Seizures (HCC) 01/16/2024   Depression 01/16/2024   Essential hypertension 01/16/2024   Diabetes mellitus type 2 in nonobese  (HCC) 01/16/2024   Acute metabolic acidosis 01/16/2024   Obesity, Class I, BMI 30-34.9 01/16/2024   Hx of colonic polyps 10/18/2023   Abdominal pain 10/18/2023   Persistent hypersomnia 02/08/2022   Parasomnia associated with another health condition 03/20/2021   Non-restorative sleep 03/20/2021   Nightmares REM-sleep type 02/05/2021   Excessive daytime sleepiness 02/05/2021   OSA on CPAP 02/05/2021   Glioblastoma, IDH-wildtype (HCC) 02/05/2021   Erectile dysfunction 07/29/2015   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 Denies any significant functional changes. Denies falls.   Pt accompanied by: self   PERTINENT HISTORY: PMH includes R medial occipital lobe anaplastic oligodendroglioma s/p resection and chemo radiation, anxiety, BPH, DM, HTN, HLD.  HPI per ED note 01/15/2024: Randall Lewis is a 68 y.o. male with prior history of left frontal anaplastic oligodendroglioma status postcraniotomy and resection on 03/2005 status post 6 weeks of radiation therapy and concurrent Temodar  ending on 06/22/2005, diabetes mellitus type 2, hypertension, hyperlipidemia, BPH, sleep apnea who had craniotomy on 04/18/2023 for glioblastoma presently on chemo and Avastin  being followed at [facility] Lewis and also Dr. Buckley was brought to the ER after patient was found to  have left-sided weakness following which patient had seizure-like activity on the left side at around 9 PM yesterday at home.  Patient has been having headache for the last 24 hours and he tried some Tylenol  and also some leftover oxycodone  the headache eased up little bit.  Later in the evening around 9 PM when he was walking to the bathroom when he had a fall but did not hit his head or lose consciousness.  Patient's wife states he was with him she helped him to the bathroom and while on the commode he started developing left-sided weakness with seizure and EMS was called.  Patient's wife states that over the last 3 weeks he has been having some poor vision in the left eye.  PAIN:  Are you having pain? No  PRECAUTIONS: Fall and Other: seizures   PATIENT GOALS: 100% recovery  OBJECTIVE:  Note: Objective measures were completed at Evaluation unless otherwise noted.  DIAGNOSTIC FINDINGS:   Head CT 01/15/2024 IMPRESSION: 1. Allowing for modality difference, subdural hematoma of 8 mm thickness underlying the right parietal craniotomy site is unchanged compared to the MRI of 12/22/23. 2. Diffuse white matter hypoattenuation throughout the posterior right hemisphere at the area of tumor resection, with improved mass effect on the right lateral ventricle. No midline shift or hydrocephalus. 3. Chronic gliosis in the left frontal white matter, unchanged. 4. Findings communicated to Randall Lewis at 11:19 pm on 01/15/24.  COGNITION: Overall cognitive status: minor cognitive impairments per patient report                                                                                                                               TREATMENT   Therapeutic Activity: Vitals:   02/09/24 1449  BP: (!) 140/86  Pulse: 73  -standing wood puzzle with R upper corner completed already  -poor awareness of visual errors and visuospatial organization   -decr problem solving noted  NMR: -standing  on solid ground reading Randall Lewis hitting blaze pods random color tap in visual periphery   -progressed to standing on airex  -consistently missing first 1-3 letters of Hart Chart set ~10* off midline to his L with poor awareness of this despite  PT pointing it out  -improved visual scanning to reach Blaze pods set to L with increased time on task     PATIENT EDUCATION: Education details: continue HEP Person educated: Patient Education method: Explanation, Demonstration, Verbal cues, and Handouts Education comprehension: verbalized understanding, returned demonstration, and needs further education  HOME EXERCISE PROGRAM: Access Code: [email or web address] URL: [email or web address] Date: 02/07/2024 Prepared by: Sheffield Senate  Exercises - Sit to Stand  - 1-2 x daily - 4-5 x weekly - 2 sets - 10 reps - Romberg Stance Eyes Closed on Foam Pad  - 1-2 x daily - 4-5 x weekly - 3 sets - 30 hold - Romberg Stance on Foam Pad with Head Rotation  - 1-2 x daily - 4-5 x weekly - 2 sets - 10 reps and 10 reps head nods  - Walking with Head Rotation  - 1-2 x daily - 4-5 x weekly - 3 sets - Standing Marching  - 1-2 x daily - 4-5 x weekly - 3 sets  GOALS: Goals reviewed with patient? Yes  SHORT TERM GOALS: Target date: 02/13/2024   Pt will be independent with initial HEP for improved strength, balance, transfers and gait. Baseline: Goal status: INITIAL  2.  Pt will improve 5 x STS to less than or equal to 17 seconds to demonstrate improved functional strength and transfer efficiency.  Baseline: 20.75 sec no UE (8/25) Goal status: INITIAL  3.  Pt will improve gait velocity to at least 2.75 ft/sec for improved gait efficiency and performance at SBA level  Baseline: 2.56 ft/sec no AD SBA (8/25) Goal status: INITIAL  4.  Pt will improve FGA to 19/30 for decreased fall risk  Baseline: 15/30 (8/25) Goal status: INITIAL    LONG TERM GOALS: Target date: 03/05/2024    Pt will be  independent with final HEP for improved strength, balance, transfers and gait. Baseline:  Goal status: INITIAL  2.  Pt will improve 5 x STS to less than or equal to 14 seconds to demonstrate improved functional strength and transfer efficiency.  Baseline: 20.75 sec no UE (8/25) Goal status: INITIAL  3.  Pt will improve gait velocity to at least 3.0 ft/sec for improved gait efficiency and performance at mod I level  Baseline:  2.56 ft/sec no AD SBA (8/25) Goal status: INITIAL  4.  Pt will improve FGA to 23/30 for decreased fall risk  Baseline: 15/30 (8/25) Goal status: INITIAL  5.  Pt will demonstrate good ability to compensate for his visual impairments in order to decrease his fall risk and be able to name 2 strategies for compensation. Baseline: visual impairments not assessed at eval due to time constraints Goal status: INITIAL    ASSESSMENT:  CLINICAL IMPRESSION: Patient seen for skilled PT session with emphasis on dynamic balance and visual scanning. Patient progressing well with gross visual scanning, but poor adherence to technique of scanning L toward end of reading line. With puzzle, noted to have poor error awareness and visuospatial challenges. Continue POC.    OBJECTIVE IMPAIRMENTS: Abnormal gait, decreased balance, decreased cognition, decreased knowledge of condition, decreased knowledge of use of DME, difficulty walking, decreased strength, impaired perceived functional ability, and impaired vision/preception.   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  REHAB POTENTIAL: Good  CLINICAL DECISION MAKING:  Unstable/unpredictable  EVALUATION COMPLEXITY: High  PLAN:  PT FREQUENCY: 2x/week  PT DURATION: 6 weeks  PLANNED INTERVENTIONS: 02835- PT Re-evaluation, 97750- Physical Performance Testing, 97110-Therapeutic exercises, 97530- Therapeutic activity, 97112- Neuromuscular re-education, 97535- Self Care, 02859- Manual therapy, [phone]- Gait training, [phone] (1-2 muscles), [phone] (3+ muscles)- Dry Needling, Patient/Family education, Balance training, Stair training, Taping, Joint mobilization, Spinal mobilization, Vestibular training, Visual/preceptual remediation/compensation, Cognitive remediation, DME instructions, Cryotherapy, and Moist heat  PLAN FOR NEXT SESSION: seizures, add to HEP as needed, work on compensations for L visual field impairments, simple dual tasking, work on balance tasks/functional strengthening    Delon DELENA Pop, PT, DPT, CBIS 02/09/24 3:43 PM

## 2024-02-13 ENCOUNTER — Ambulatory Visit: Payer: Medicare PPO | Admitting: Adult Health

## 2024-02-13 ENCOUNTER — Ambulatory Visit (HOSPITAL_COMMUNITY)
Admission: RE | Admit: 2024-02-13 | Discharge: 2024-02-13 | Disposition: A | Discharge status: Home / Self Care | Source: Ambulatory Visit | Attending: Internal Medicine | Admitting: Internal Medicine

## 2024-02-13 ENCOUNTER — Other Ambulatory Visit (HOSPITAL_COMMUNITY): Payer: Self-pay

## 2024-02-13 DIAGNOSIS — C719 Malignant neoplasm of brain, unspecified: Secondary | ICD-10-CM | POA: Diagnosis present

## 2024-02-13 MED ORDER — GADOBUTROL 1 MMOL/ML IV SOLN
10.0000 mL | Freq: Once | INTRAVENOUS | Status: AC | PRN
  Administered 2024-02-13: 10 mL via INTRAVENOUS

## 2024-02-14 ENCOUNTER — Ambulatory Visit: Admitting: Physical Therapy

## 2024-02-14 VITALS — BP 151/89 | HR 54

## 2024-02-14 DIAGNOSIS — R2681 Unsteadiness on feet: Secondary | ICD-10-CM

## 2024-02-14 DIAGNOSIS — M6281 Muscle weakness (generalized): Secondary | ICD-10-CM | POA: Diagnosis not present

## 2024-02-14 DIAGNOSIS — R296 Repeated falls: Secondary | ICD-10-CM

## 2024-02-14 DIAGNOSIS — R2689 Other abnormalities of gait and mobility: Secondary | ICD-10-CM

## 2024-02-14 NOTE — Therapy (Signed)
 OUTPATIENT PHYSICAL THERAPY NEURO TREATMENT   Patient Name: Randall Lewis MRN: [id number] DOB:01-24-1956, 68 y.o., male Today's Date: 02/14/2024   PCP: Yolande Toribio MATSU, MD REFERRING PROVIDER: Laurence Locus, DO  END OF SESSION:  PT End of Session - 02/14/24 1146     Visit Number 5    Number of Visits 13    Date for PT Re-Evaluation 04/02/24    Authorization Type Medicare    Progress Note Due on Visit 10    PT Start Time 1144    PT Stop Time 1230    PT Time Calculation (min) 46 min    Equipment Utilized During Treatment Gait belt    Activity Tolerance Patient tolerated treatment well    Behavior During Therapy WFL for tasks assessed/performed           Past Medical History:  Diagnosis Date   Anxiety    BPH (benign prostatic hyperplasia)    Cancer (HCC) 05/31/2004   Brain tumor, in remission after chemo radiation and surgery   Diabetes mellitus without complication (HCC)    ED (erectile dysfunction)    Heart murmur    History of heart murmur   Hyperlipidemia    Hypertension    Sleep apnea    USES C-PAP   UTI (urinary tract infection)    Past Surgical History:  Procedure Laterality Date   BRAIN SURGERY  2006   BRAIN TUMOR  - CANCER   COLONOSCOPY     CYSTOSCOPY N/A 07/29/2015   Procedure: FLEXIBLE CYSTOSCOPY;  Surgeon: Norleen Seltzer, MD;  Location: [facility];  Service: Urology;  Laterality: N/A;   FOOT BONE EXCISION  1989   PENILE PROSTHESIS IMPLANT N/A 07/29/2015   Procedure: PENILE PROSTHESIS;  Surgeon: Norleen Seltzer, MD;  Location: [facility];  Service: Urology;  Laterality: N/A;   right knee meniscus repair  2010   THYROID  LOBECTOMY  05/12/2012   Procedure: THYROID  LOBECTOMY;  Surgeon: Krystal CHRISTELLA Spinner, MD;  Location: [facility];  Service: General;  Laterality: Left;  left thyroid  lobectomy   Patient Active Problem List   Diagnosis Date Noted   Seizures (HCC) 01/16/2024   Depression 01/16/2024   Essential hypertension 01/16/2024   Diabetes mellitus type 2 in nonobese  (HCC) 01/16/2024   Acute metabolic acidosis 01/16/2024   Obesity, Class I, BMI 30-34.9 01/16/2024   Hx of colonic polyps 10/18/2023   Abdominal pain 10/18/2023   Persistent hypersomnia 02/08/2022   Parasomnia associated with another health condition 03/20/2021   Non-restorative sleep 03/20/2021   Nightmares REM-sleep type 02/05/2021   Excessive daytime sleepiness 02/05/2021   OSA on CPAP 02/05/2021   Glioblastoma, IDH-wildtype (HCC) 02/05/2021   Erectile dysfunction 07/29/2015   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 Pt denies any pain today.  Pt reports that his vision is not as bad as it used to be, believes Dr. Buckley has sent a referral to neuro-ophthalmology, waiting to hear from them about scheduling.  Pt feels comfortable with his HEP, has not worked on it yet.  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  HPI per ED note 01/15/2024: Randall Lewis is a 68 y.o. male with prior history of left frontal anaplastic oligodendroglioma status postcraniotomy and resection on 03/2005 status post 6 weeks of radiation therapy and concurrent Temodar  ending on 06/22/2005, diabetes  mellitus type 2, hypertension, hyperlipidemia, BPH, sleep apnea who had craniotomy on 04/18/2023 for glioblastoma presently on chemo and Avastin  being followed at [facility] and also Dr. Buckley was brought to the ER after patient was found to have left-sided weakness following which patient had seizure-like activity on the left side at around 9 PM yesterday at home.  Patient has been having headache for the last 24 hours and he tried some Tylenol  and also some leftover oxycodone  the headache eased up little bit.  Later in the evening around 9 PM when he was walking to the bathroom when he had a fall but did not hit his head or lose consciousness.  Patient's wife states he was with him she helped him to the bathroom and while on the commode he started developing left-sided weakness with seizure and EMS was called.  Patient's wife states that over the last 3 weeks he has been having some poor vision in the left eye.  PAIN:  Are you having pain? No  PRECAUTIONS: Fall and Other: seizures   PATIENT GOALS: 100% recovery  OBJECTIVE:  Note: Objective measures were completed at Evaluation unless otherwise noted.  DIAGNOSTIC FINDINGS:   Head CT 01/15/2024 IMPRESSION: 1. Allowing for modality difference, subdural hematoma of 8 mm thickness underlying the right parietal craniotomy site is unchanged compared to the MRI of 12/22/23. 2. Diffuse white matter hypoattenuation throughout the posterior right hemisphere at the area of tumor resection, with improved mass effect on the right lateral ventricle. No midline shift or hydrocephalus. 3. Chronic gliosis in the left frontal white matter, unchanged. 4. Findings communicated to Dr. [name] at 11:19 pm on 01/15/24.  COGNITION: Overall cognitive status: minor cognitive impairments per patient report                                                                                                                               TREATMENT    Self-Care/Home Management Vitals:   02/14/24 1152  BP: (!) 151/89  Pulse: (!) 54   BP assessed in LUE in sitting at rest, elevated this date but within safe limits for participation in PT  session. Pt is wearing a thick sleeve over his arm when BP assessed which may have affected his reading as well.   Physical Performance For STG assessment  [facility] PT Assessment - 02/14/24 1155       Ambulation/Gait   Gait velocity 32.8 ft over 10.25 sec = 3.2 ft/sec   no AD     Standardized Balance Assessment   Standardized Balance Assessment Timed Up and Go Test;Five Times Sit to Stand    Five times sit to stand comments  20.25 sec   no UE     Functional Gait  Assessment   Gait assessed  Yes    Gait Level Surface Walks 20 ft in less than 7 sec but greater than 5.5 sec, uses assistive device, slower speed, mild gait deviations, or deviates 6-10 in outside of the 12 in walkway width.    Change in Gait Speed Able to change speed, demonstrates mild gait deviations, deviates 6-10 in outside of the 12 in walkway width, or no gait deviations, unable to achieve a major change in velocity, or uses a change in velocity, or uses an assistive device.    Gait with Horizontal Head Turns Performs head turns smoothly with slight change in gait velocity (eg, minor disruption to smooth gait path), deviates 6-10 in outside 12 in walkway width, or uses an assistive device.    Gait with Vertical Head Turns Performs task with slight change in gait velocity (eg, minor disruption to smooth gait path), deviates 6 - 10 in outside 12 in walkway width or uses assistive device    Gait and Pivot Turn Pivot turns safely within 3 sec and stops quickly with no loss of balance.    Step Over Obstacle Is able to step over one shoe box (4.5 in total height) but must slow down and adjust steps to clear box safely. May require verbal cueing.    Gait with Narrow Base of Support Is able to ambulate for 10 steps heel to toe with no  staggering.    Gait with Eyes Closed Walks 20 ft, slow speed, abnormal gait pattern, evidence for imbalance, deviates 10-15 in outside 12 in walkway width. Requires more than 9 sec to ambulate 20 ft.    Ambulating Backwards Walks 20 ft, uses assistive device, slower speed, mild gait deviations, deviates 6-10 in outside 12 in walkway width.    Steps Alternating feet, must use rail.    Total Score 20    FGA comment: 20/30, medium fall risk          TherAct For review of prescribed HEP Sit to stands no UE x 10 reps easy Added in pillow under feet x 10 reps easy-med Standing squats x 10 reps easy Staggered squats x 10 reps L/R easy Spanish squats 3 x 10 reps with green TB medium  Added Spanish squats to HEP, see bolded below. Reviewed how to safely perform exercise at home and recommended family assist first time he tries setting it up.   PATIENT EDUCATION: Education details: continue HEP, revised HEP, results of OM and functional implications Person educated: Patient Education method: Explanation, Demonstration, Verbal cues, and Handouts Education comprehension: verbalized understanding, returned demonstration, and needs further education  HOME EXERCISE PROGRAM: Access Code: [email or web address] URL: [email or web address] Date: 02/07/2024 Prepared by: Sheffield Senate  Exercises - Spanish Squat With Resistance  - 1 x daily - 7 x weekly - 3 sets - 10 reps - Romberg Stance Eyes Closed on Foam Pad  - 1-2 x  daily - 4-5 x weekly - 3 sets - 30 hold - Romberg Stance on Foam Pad with Head Rotation  - 1-2 x daily - 4-5 x weekly - 2 sets - 10 reps and 10 reps head nods  - Walking with Head Rotation  - 1-2 x daily - 4-5 x weekly - 3 sets - Standing Marching  - 1-2 x daily - 4-5 x weekly - 3 sets   GOALS: Goals reviewed with patient? Yes  SHORT TERM GOALS: Target date: 02/13/2024   Pt will be independent with initial HEP for improved strength, balance, transfers and  gait. Baseline: has not worked on yet (9/16) Goal status: IN PROGRESS  2.  Pt will improve 5 x STS to less than or equal to 17 seconds to demonstrate improved functional strength and transfer efficiency.  Baseline: 20.75 sec no UE (8/25), 20.25 sec no UE (9/16) Goal status: IN PROGRESS  3.  Pt will improve gait velocity to at least 2.75 ft/sec for improved gait efficiency and performance at SBA level  Baseline: 2.56 ft/sec no AD SBA (8/25), 3.2 ft/sec no AD SBA (9/16) Goal status: MET  4.  Pt will improve FGA to 19/30 for decreased fall risk  Baseline: 15/30 (8/25), 20/30 (9/16) Goal status: MET    LONG TERM GOALS: Target date: 03/05/2024    Pt will be independent with final HEP for improved strength, balance, transfers and gait. Baseline:  Goal status: INITIAL  2.  Pt will improve 5 x STS to less than or equal to 14 seconds to demonstrate improved functional strength and transfer efficiency.  Baseline: 20.75 sec no UE (8/25), 20.25 sec no UE (9/16) Goal status: INITIAL  3.  Pt will improve gait velocity to at least 3.5 ft/sec for improved gait efficiency and performance at mod I level  Baseline:  2.56 ft/sec no AD SBA (8/25), 3.2 ft/sec no AD SBA (9/16) Goal status: REVISED/UPGRADED  4.  Pt will improve FGA to 23/30 for decreased fall risk  Baseline: 15/30 (8/25), 20/30 (9/16) Goal status: INITIAL  5.  Pt will demonstrate good ability to compensate for his visual impairments in order to decrease his fall risk and be able to name 2 strategies for compensation. Baseline: visual impairments not assessed at eval due to time constraints Goal status: INITIAL    ASSESSMENT:  CLINICAL IMPRESSION: Emphasis of skilled PT session on assessing STG and initiating review of his prescribed HEP. Pt has met 2/2 STG due to improving his gait speed from 2.56 ft/sec initially to 3.2 ft/sec this date and improving his score on the FGA from 15/30 initially to 20/30 this date indicating a  decreased fall risk. He did slightly improve his 5xSTS score but not quite enough to meet his STG. Finally, he has had not tried working on his HEP at home yet but is making progress towards that goal. Remainder of session focus on reviewing his HEP and revising exercises to make them more challenging, upgraded sit to stands to Spanish squats and will review remainder of HEP in future sessions. Overall he feels like his vision in improving and/or he is better able to compensate for impairments. He continues to benefit from skilled PT services to work on improving his strength and functional balance in order to decrease his fall risk. Continue POC.   OBJECTIVE IMPAIRMENTS: Abnormal gait, decreased balance, decreased cognition, decreased knowledge of condition, decreased knowledge of use of DME, difficulty walking, decreased strength, impaired perceived functional ability, and impaired vision/preception.  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   REHAB POTENTIAL: Good  CLINICAL DECISION MAKING: Unstable/unpredictable  EVALUATION COMPLEXITY: High  PLAN:  PT FREQUENCY: 2x/week  PT DURATION: 6 weeks  PLANNED INTERVENTIONS: 02835- PT Re-evaluation, 97750- Physical Performance Testing, 97110-Therapeutic exercises, 97530- Therapeutic activity, 97112- Neuromuscular re-education, 97535- Self Care, [id number]- Manual therapy, [phone]- Gait training, [phone] (1-2 muscles), [phone] (3+ muscles)- Dry Needling, Patient/Family education, Balance training, Stair training, Taping, Joint mobilization, Spinal mobilization, Vestibular training, Visual/preceptual  remediation/compensation, Cognitive remediation, DME instructions, Cryotherapy, and Moist heat  PLAN FOR NEXT SESSION: seizures, add to HEP as needed, work on compensations for L visual field impairments, simple dual tasking, work on [employer] manager, finish reviewing/revising HEP   Waddell Southgate, PT, DPT, CSRS  02/14/24 12:37 PM

## 2024-02-16 ENCOUNTER — Other Ambulatory Visit (HOSPITAL_COMMUNITY): Payer: Self-pay

## 2024-02-16 ENCOUNTER — Inpatient Hospital Stay

## 2024-02-16 ENCOUNTER — Inpatient Hospital Stay: Admitting: Internal Medicine

## 2024-02-16 ENCOUNTER — Encounter: Payer: Self-pay | Admitting: *Deleted

## 2024-02-16 ENCOUNTER — Ambulatory Visit: Admitting: Physical Therapy

## 2024-02-16 ENCOUNTER — Other Ambulatory Visit: Payer: Self-pay

## 2024-02-16 VITALS — BP 127/82 | HR 95 | Temp 97.5°F | Resp 20 | Wt 223.2 lb

## 2024-02-16 VITALS — BP 137/87 | HR 58

## 2024-02-16 DIAGNOSIS — M6281 Muscle weakness (generalized): Secondary | ICD-10-CM | POA: Diagnosis not present

## 2024-02-16 DIAGNOSIS — R2681 Unsteadiness on feet: Secondary | ICD-10-CM

## 2024-02-16 DIAGNOSIS — R2689 Other abnormalities of gait and mobility: Secondary | ICD-10-CM

## 2024-02-16 DIAGNOSIS — R569 Unspecified convulsions: Secondary | ICD-10-CM | POA: Diagnosis not present

## 2024-02-16 DIAGNOSIS — C711 Malignant neoplasm of frontal lobe: Secondary | ICD-10-CM | POA: Diagnosis not present

## 2024-02-16 DIAGNOSIS — C719 Malignant neoplasm of brain, unspecified: Secondary | ICD-10-CM | POA: Diagnosis not present

## 2024-02-16 DIAGNOSIS — R296 Repeated falls: Secondary | ICD-10-CM

## 2024-02-16 LAB — CBC WITH DIFFERENTIAL (CANCER CENTER ONLY)
Abs Immature Granulocytes: 0 K/uL (ref 0.00–0.07)
Basophils Absolute: 0 K/uL (ref 0.0–0.1)
Basophils Relative: 0 %
Eosinophils Absolute: 0 K/uL (ref 0.0–0.5)
Eosinophils Relative: 0 %
HCT: 51 % (ref 39.0–52.0)
Hemoglobin: 17.6 g/dL — ABNORMAL HIGH (ref 13.0–17.0)
Immature Granulocytes: 0 %
Lymphocytes Relative: 24 %
Lymphs Abs: 0.8 K/uL (ref 0.7–4.0)
MCH: 26.7 pg (ref 26.0–34.0)
MCHC: 34.5 g/dL (ref 30.0–36.0)
MCV: 77.5 fL — ABNORMAL LOW (ref 80.0–100.0)
Monocytes Absolute: 0.3 K/uL (ref 0.1–1.0)
Monocytes Relative: 8 %
Neutro Abs: 2.4 K/uL (ref 1.7–7.7)
Neutrophils Relative %: 68 %
Platelet Count: 165 K/uL (ref 150–400)
RBC: 6.58 MIL/uL — ABNORMAL HIGH (ref 4.22–5.81)
RDW: 17.2 % — ABNORMAL HIGH (ref 11.5–15.5)
WBC Count: 3.5 K/uL — ABNORMAL LOW (ref 4.0–10.5)
nRBC: 0 % (ref 0.0–0.2)

## 2024-02-16 MED ORDER — LOMUSTINE 100 MG PO CAPS
200.0000 mg | ORAL_CAPSULE | Freq: Once | ORAL | 0 refills | Status: DC
  Filled 2024-02-16: qty 2, 1d supply, fill #0
  Filled 2024-02-20: qty 2, 30d supply, fill #0

## 2024-02-16 MED ORDER — SODIUM CHLORIDE 0.9 % IV SOLN
10.0000 mg/kg | Freq: Once | INTRAVENOUS | Status: AC
  Administered 2024-02-16: 1100 mg via INTRAVENOUS
  Filled 2024-02-16: qty 12

## 2024-02-16 MED ORDER — SODIUM CHLORIDE 0.9 % IV SOLN: INTRAVENOUS | Status: DC

## 2024-02-16 NOTE — Progress Notes (Signed)
 Contacted [facility] regarding patient referral, he has not been contacted.  Per their request referral refaxed to [phone], fax confirmation received.  Office staff state they will contact patient today.

## 2024-02-16 NOTE — Progress Notes (Signed)
 [facility] [address]  [place], [place] [address] [phone]   Interval Evaluation  Date of Service: 02/16/24 Patient Name: Randall Lewis Patient MRN: [id number] Patient DOB: June 03, 1955 Provider: Arthea MARLA Manns, MD  Identifying Statement:  Randall Lewis is a 68 y.o. male with right frontal glioblastoma    Oncologic History: Anaplastic oligodendroglioma of frontal lobe (CMS/HHS-HCC)  04/16/2005 Surgery  Craniotomy for resection of left frontal lesion. 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 Lost to follow up after 03/18/2010  05/02/2023 Presentation  Presentation to the [facility] at [facility]. Recommendations pending final pathology. Likely XRT/Temozolomide  as this is a high grade glioma. 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 Pathology reveals Infiltrating High Grade Glioma pending final molecular studies for integrated diagnosis.  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 (chromosomal microarray) Chromosome 10 loss with PTEN loss is detected (chromosomal microarray) 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  No further seizures since prior visit, he continues on Keppra  1000mg  twice per day.  Today he otherwise reports overall stability of previously noted impaired vision on the left side, he has been unable to drive safely as a result of this.  He still reports recognizing faces in people that he doesn't know.  Otherwise his walking is independent.  No headaches or seizures.    H+P (05/19/23) Patient presents for follow up after craniotomy, resection of recurrent primary brain tumor at [facility] on 04/18/23.  He tolerated surgery well, no ill effects.  He is functionally intact, no further headaches, no seizures.  Recently increased blood pressure medication with his PCP.  Has visit with radiation oncology upcoming as well.  Medications: Current Outpatient Medications on File Prior to Visit  Medication Sig Dispense Refill   acetaminophen  (TYLENOL ) 325 MG tablet Take 975 mg by mouth every 6 (six) hours as needed.     amLODipine  (NORVASC ) 5 MG tablet Take 5 mg by mouth daily.     JARDIANCE 25 MG TABS tablet Take 25 mg by mouth daily.     levETIRAcetam  (KEPPRA ) 1000 MG tablet Take 1 tablet (1,000 mg total) by mouth 2 (  two) times daily. 180 tablet 0   lomustine   (GLEOSTINE ) 40 MG capsule Take 5 capsules (200 mg total) by mouth once for 1 dose. Take on an empty stomach 1 hour before or 2 hours after meals. (Patient not taking: Reported on 02/02/2024) 5 capsule 0   losartan -hydrochlorothiazide  (HYZAAR) 100-12.5 MG tablet Take 1 tablet by mouth daily.     metFORMIN (GLUCOPHAGE-XR) 500 MG 24 hr tablet Take 1,000 mg by mouth daily.     metoprolol  tartrate (LOPRESSOR ) 12.5 mg TABS tablet Take 12.5 mg by mouth daily.     oxyCODONE  (OXY IR/ROXICODONE ) 5 MG immediate release tablet Take 5 mg by mouth every 4 (four) hours as needed.     pioglitazone (ACTOS) 15 MG tablet Take 15 mg by mouth daily.     predniSONE  (DELTASONE ) 50 MG tablet Take 1 tablet at 13 hours, 7 hours, and 1 hour prior to IV contrast 9 tablet 0   simvastatin  (ZOCOR ) 20 MG tablet Take 20 mg by mouth daily.     tamsulosin  (FLOMAX ) 0.4 MG CAPS capsule Take 0.4 mg by mouth daily. (Patient not taking: Reported on 02/02/2024)     No current facility-administered medications on file prior to visit.    Allergies:  Allergies  Allergen Reactions   Sildenafil Other (See Comments) and Nausea Only   Gadolinium Derivatives Nausea And Vomiting    Per pt , always get sick with gado and never offered anti nausea meds.   Past Medical History:  Past Medical History:  Diagnosis Date   Anxiety    BPH (benign prostatic hyperplasia)    Cancer (HCC) 05/31/2004   Brain tumor, in remission after chemo radiation and surgery   Diabetes mellitus without complication (HCC)    ED (erectile dysfunction)    Heart murmur    History of heart murmur   Hyperlipidemia    Hypertension    Sleep apnea    USES C-PAP   UTI (urinary tract infection)    Past Surgical History:  Past Surgical History:  Procedure Laterality Date   BRAIN SURGERY  2006   BRAIN TUMOR  - CANCER   COLONOSCOPY     CYSTOSCOPY N/A 07/29/2015   Procedure: FLEXIBLE CYSTOSCOPY;  Surgeon: Norleen Seltzer, MD;  Location: [facility];  Service: Urology;   Laterality: N/A;   FOOT BONE EXCISION  1989   PENILE PROSTHESIS IMPLANT N/A 07/29/2015   Procedure: PENILE PROSTHESIS;  Surgeon: Norleen Seltzer, MD;  Location: [facility];  Service: Urology;  Laterality: N/A;   right knee meniscus repair  2010   THYROID  LOBECTOMY  05/12/2012   Procedure: THYROID  LOBECTOMY;  Surgeon: Krystal CHRISTELLA Spinner, MD;  Location: [facility];  Service: General;  Laterality: Left;  left thyroid  lobectomy   Social History:  Social History   Socioeconomic History   Marital status: Married    Spouse name: [name]   Number of children: 2   Years of education: Not on file   Highest education level: Master's degree (e.g., MA, MS, MEng, MEd, MSW, MBA)  Occupational History   Not on file  Tobacco Use   Smoking status: Former    Current packs/day: 0.00    Average packs/day: 1 pack/day for 23.0 years (23.0 ttl pk-yrs)    Types: Cigarettes    Start date: 04/26/1973    Quit date: 04/26/1996    Years since quitting: 27.8   Smokeless tobacco: Never  Vaping Use   Vaping status: Never Used  Substance and Sexual Activity   Alcohol use:  No   Drug use: No   Sexual activity: Not on file  Other Topics Concern   Not on file  Social History Narrative   Lives with wife   Right handed   Caffeine: 2 cups of coffee and 1 glass of tea/soda a day   Social Drivers of Health   Financial Resource Strain: Low Risk  (05/01/2023)   Received from [facility]   Overall Financial Resource Strain (CARDIA)    Difficulty of Paying Living Expenses: Not hard at all  Food Insecurity: No Food Insecurity (01/16/2024)   Hunger Vital Sign    Worried About Running Out of Food in the Last Year: Never true    Ran Out of Food in the Last Year: Never true  Transportation Needs: No Transportation Needs (01/16/2024)   PRAPARE - Administrator, Civil Service (Medical): No    Lack of Transportation (Non-Medical): No  Physical Activity: Not on file  Stress: Not on file  Social Connections:  Unknown (01/16/2024)   Social Connection and Isolation Panel    Frequency of Communication with Friends and Family: More than three times a week    Frequency of Social Gatherings with Friends and Family: More than three times a week    Attends Religious Services: Not on Insurance claims handler of Clubs or Organizations: Yes    Attends Banker Meetings: More than 4 times per year    Marital Status: Married  Catering manager Violence: Not At Risk (01/16/2024)   Humiliation, Afraid, Rape, and Kick questionnaire    Fear of Current or Ex-Partner: No    Emotionally Abused: No    Physically Abused: No    Sexually Abused: No   Family History:  Family History  Problem Relation Age of Onset   Breast cancer Mother    Cancer Brother 64   Heart attack Brother    Heart attack Brother    Birth defects Son    Colon cancer Neg Hx    Liver disease Neg Hx    Esophageal cancer Neg Hx     Review of Systems: Constitutional: Doesn't report fevers, chills or abnormal weight loss Eyes: Doesn't report blurriness of vision Ears, nose, mouth, throat, and face: Doesn't report sore throat Respiratory: Doesn't report cough, dyspnea or wheezes Cardiovascular: Doesn't report palpitation, chest discomfort  Gastrointestinal:  Doesn't report nausea, constipation, diarrhea GU: Doesn't report incontinence Skin: Doesn't report skin rashes Neurological: Per HPI Musculoskeletal: Doesn't report joint pain Behavioral/Psych: Doesn't report anxiety  Physical Exam: There were no vitals filed for this visit.  KPS: 70 General: Alert, cooperative, pleasant, in no acute distress Head: Normal EENT: No conjunctival injection or scleral icterus.  Lungs: Resp effort normal Cardiac: Regular rate Abdomen: Non-distended abdomen Skin: No rashes cyanosis or petechiae. Extremities: No clubbing or edema  Neurologic Exam: Mental Status: Awake, alert, attentive to examiner. Oriented to self and environment.  Language is fluent with intact comprehension.  Cranial Nerves: Visual acuity is grossly normal. Left hemianopia. Extra-ocular movements intact. No ptosis. Face is symmetric Motor: Tone and bulk are normal. Power is full in both arms and legs. Reflexes are symmetric, no pathologic reflexes present.  Sensory: Intact to light touch Gait: Normal.   Labs: I have reviewed the data as listed    Component Value Date/Time   NA 135 01/16/2024 0252   K 4.1 01/16/2024 0252   CL 104 01/16/2024 0252   CO2 23 01/16/2024 0252   GLUCOSE 115 (  H) 01/16/2024 0252   BUN 14 01/16/2024 0252   CREATININE 1.17 01/16/2024 0252   CREATININE 1.30 (H) 12/26/2023 1109   CREATININE 1.31 (H) 02/25/2012 1438   CALCIUM 8.7 (L) 01/16/2024 0252   PROT 6.0 (L) 01/16/2024 0252   ALBUMIN 3.2 (L) 01/16/2024 0252   AST 18 01/16/2024 0252   AST 13 (L) 12/26/2023 1109   ALT 15 01/16/2024 0252   ALT 17 12/26/2023 1109   ALKPHOS 42 01/16/2024 0252   BILITOT 0.8 01/16/2024 0252   BILITOT 0.7 12/26/2023 1109   GFRNONAA >60 01/16/2024 0252   GFRNONAA >60 12/26/2023 1109   GFRNONAA >60 02/25/2012 1438   GFRAA >60 08/14/2019 1438   GFRAA >60 02/25/2012 1438   Lab Results  Component Value Date   WBC 3.5 (L) 02/16/2024   NEUTROABS 2.4 02/16/2024   HGB 17.6 (H) 02/16/2024   HCT 51.0 02/16/2024   MCV 77.5 (L) 02/16/2024   PLT 165 02/16/2024   Imaging:  [facility] Clinician Interpretation: I have personally reviewed the CNS images as listed.  My interpretation, in the context of the patient's clinical presentation, is stable disease  MR BRAIN W WO CONTRAST Result Date: 02/13/2024 CLINICAL DATA:  Brain/CNS neoplasm, assess treatment response. Glioblastoma. EXAM: MRI HEAD WITHOUT AND WITH CONTRAST TECHNIQUE: Multiplanar, multiecho pulse sequences of the brain and surrounding structures were obtained without and with intravenous contrast. CONTRAST:  10mL GADAVIST  GADOBUTROL  1 MMOL/ML IV SOLN COMPARISON:  Head CT 01/15/2024 and  MRI 12/22/2023 FINDINGS: Brain: Enhancement associated with the necrotic lesion in the medial aspect of the right occipital and posterior temporal lobes has resolved. There is increased susceptibility and intrinsic T1 hyperintensity in this region suggesting chronic blood products and mineralization. There is also significantly progressive, confluent restricted diffusion which now measures up to approximately 5 cm in diameter with associated heterogeneous T2 hyperintensity. However, the overall extent of T2 hyperintensity throughout the right occipital, posterior temporal, and parietal lobes has decreased with decreased mass effect including on the right lateral ventricle. A resection cavity is again noted more laterally in the right temporoparietal region with chronic blood products and unchanged mild enhancement. A resection cavity and mild surrounding T2 hyperintensity in the anterior left frontal lobe are unchanged and without associated enhancement. There is no midline shift or hydrocephalus. Minimal chronic extra-axial fluid is noted subjacent to the right-sided craniotomy. There is mild cerebral atrophy. Vascular: Major intracranial vascular flow voids are preserved. Skull and upper cervical spine: Bilateral craniotomies. Sinuses/Orbits: Unremarkable orbits. Clear paranasal sinuses. Minimal left mastoid fluid. Other: None. IMPRESSION: Resolved enhancement associated with the necrotic lesion in the medial right occipital lobe with overall decreased regional edema and mass effect, however a 5 cm region of confluent restricted diffusion has greatly progressed. This may reflect coagulative necrosis secondary to Avastin  therapy (favored) versus nonenhancing tumor. Electronically Signed   By: Dasie Hamburg M.D.   On: 02/13/2024 15:38    Assessment/Plan Glioblastoma, IDH-wildtype (HCC)  Seizures (HCC)  Randall Lewis is clinically stable today, now having completed cycle #1 CCNU/avastin .  MRI brain  demonstrates stable findings with regards to tumor burden, progression seen previously.  Labs are within normal limits.    Patient elected to continue with cycle #2 oral CCNU 90mg /m2 q6 weeks and avastin  10mg /kg IV 2q weeks.  Avastin  will be helpful as concurrent therapy given burden of enhancing tumor and steroid requirement.  We reviewed side effects of CCNU, including fatigue, nausea vomiting, cytopenias, ILD.  We reviewed side effects of avastin , including  hypertension, bleeding/clotting events, wound healing impairment.  The patient will have a complete blood count, a comprehensive metabolic panel, and urine protein performed prior to each avastin  infusion. Labs may need to be performed more often. Zofran  will prescribed for home use for nausea/vomiting.    Informed consent was obtained verbally at bedside to proceed with oral chemotherapy and avastin .  Chemotherapy should be held for the following:  ANC less than 1,000  Platelets less than 100,000  LFT or creatinine greater than 2x ULN  If clinical concerns/contraindications develop   Avastin  should be held for the following:  ANC less than 500  Platelets less than 50,000  LFT or creatinine greater than 2x ULN  If clinical concerns/contraindications develop  He understands he is not safe to drive at this time.  We did recommend following through with PT/OT as arranged by the inpatient team.  Also placed opatholmology referral today.  Keppra  will con't 1000mg  BID.  We ask that Randall Lewis return to clinic in 2 weeks for cycle #2, day 15 CCNU/avastin , or sooner as needed.    All questions were answered. The patient knows to call the clinic with any problems, questions or concerns. No barriers to learning were detected.  The total time spent in the encounter was 40 minutes and more than 50% was on counseling and review of test results   Arthea MARLA Manns, MD Medical Director of Neuro-Oncology [facility]  [facility] 02/16/24 11:04 AM

## 2024-02-16 NOTE — Progress Notes (Signed)
 Requested that images from MRI brain on 9/15 be pushed to [facility] by [employer].

## 2024-02-16 NOTE — Therapy (Signed)
 OUTPATIENT PHYSICAL THERAPY NEURO TREATMENT   Patient Name: Randall Lewis MRN: [id number] DOB:[birth year], 68 y.o., male Today's Date: 02/16/2024   PCP: Randall Toribio MATSU, MD REFERRING PROVIDER: Laurence Locus, DO  END OF SESSION:  PT End of Session - 02/16/24 1445     Visit Number 6    Number of Visits 13    Date for Recertification  04/02/24    Authorization Type Medicare    Progress Note Due on Visit 10    PT Start Time 1443    PT Stop Time 1530    PT Time Calculation (min) 47 min    Equipment Utilized During Treatment Gait belt    Activity Tolerance Patient tolerated treatment well    Behavior During Therapy WFL for tasks assessed/performed            Past Medical History:  Diagnosis Date   Anxiety    BPH (benign prostatic hyperplasia)    Cancer (HCC) 05/31/2004   Brain tumor, in remission after chemo radiation and surgery   Diabetes mellitus without complication (HCC)    ED (erectile dysfunction)    Heart murmur    History of heart murmur   Hyperlipidemia    Hypertension    Sleep apnea    USES C-PAP   UTI (urinary tract infection)    Past Surgical History:  Procedure Laterality Date   BRAIN SURGERY  2006   BRAIN TUMOR  - CANCER   COLONOSCOPY     CYSTOSCOPY N/A 07/29/2015   Procedure: FLEXIBLE CYSTOSCOPY;  Surgeon: Randall Seltzer, MD;  Location: [facility];  Service: Urology;  Laterality: N/A;   FOOT BONE EXCISION  1989   PENILE PROSTHESIS IMPLANT N/A 07/29/2015   Procedure: PENILE PROSTHESIS;  Surgeon: Randall Seltzer, MD;  Location: [facility];  Service: Urology;  Laterality: N/A;   right knee meniscus repair  2010   THYROID  LOBECTOMY  05/12/2012   Procedure: THYROID  LOBECTOMY;  Surgeon: Randall CHRISTELLA Spinner, MD;  Location: [facility];  Service: General;  Laterality: Left;  left thyroid  lobectomy   Patient Active Problem List   Diagnosis Date Noted   Seizures (HCC) 01/16/2024   Depression 01/16/2024   Essential hypertension 01/16/2024   Diabetes mellitus type 2 in nonobese  (HCC) 01/16/2024   Acute metabolic acidosis 01/16/2024   Obesity, Class I, BMI 30-34.9 01/16/2024   Hx of colonic polyps 10/18/2023   Abdominal pain 10/18/2023   Persistent hypersomnia 02/08/2022   Parasomnia associated with another health condition 03/20/2021   Non-restorative sleep 03/20/2021   Nightmares REM-sleep type 02/05/2021   Excessive daytime sleepiness 02/05/2021   OSA on CPAP 02/05/2021   Glioblastoma, IDH-wildtype (HCC) 02/05/2021   Erectile dysfunction 07/29/2015   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 Denies any falls or acute changes since last visit. Pt had a busy morning with oncology appointments, feeling good this afternoon.  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  HPI per ED note 01/15/2024: Randall Lewis is a 68 y.o. male with prior history of left frontal anaplastic oligodendroglioma status postcraniotomy and resection on 03/2005 status post 6 weeks of radiation therapy and concurrent Temodar  ending on 06/22/2005, diabetes mellitus type 2, hypertension, hyperlipidemia, BPH, sleep apnea who had craniotomy on 04/18/2023 for glioblastoma presently on  chemo and Avastin  being followed at [facility] and also Dr. Buckley was brought to the ER after patient was found to have left-sided weakness following which patient had seizure-like activity on the left side at around 9 PM yesterday at home.  Patient has been having headache for the last 24 hours and he tried some Tylenol  and also some leftover oxycodone  the headache eased up little bit.  Later in the evening around 9 PM when he was walking to the bathroom when he had a fall but did not hit his head or lose consciousness.  Patient's wife states he was with him she helped him to the bathroom and while on the commode he started developing left-sided weakness with seizure and EMS was called.  Patient's wife states that over the last 3 weeks he has been having some poor vision in the left eye.  PAIN:  Are you having pain? No  PRECAUTIONS: Fall and Other: seizures   PATIENT GOALS: 100% recovery  OBJECTIVE:  Note: Objective measures were completed at Evaluation unless otherwise noted.  DIAGNOSTIC FINDINGS:   Head CT 01/15/2024 IMPRESSION: 1. Allowing for modality difference, subdural hematoma of 8 mm thickness underlying the right parietal craniotomy site is unchanged compared to the MRI of 12/22/23. 2. Diffuse white matter hypoattenuation throughout the posterior right hemisphere at the area of tumor resection, with improved mass effect on the right lateral ventricle. No midline shift or hydrocephalus. 3. Chronic gliosis in the left frontal white matter, unchanged. 4. Findings communicated to Dr. [name] at 11:19 pm on 01/15/24.  COGNITION: Overall cognitive status: minor cognitive impairments per patient report                                                                                                                               TREATMENT   Self-Care/Home Management Vitals:   02/16/24 1448  BP: 137/87  Pulse: (!) 58   BP assessed in LUE in sitting at rest,  [facility].   TherAct For review of prescribed HEP Static standing balance on airex in corner: Romberg stance with EC x 30 sec, easy Romberg stance with EO with head turns horizontal x 10 reps L/R, easy Romberg  stance with EC and horizontal head turns x 10 reps L/R, medium Modified tandem stance on airex L/R 2 x 30 sec each, progresses from medium to easy Added in horizontal head turns x 10 reps L/R with L/R stance, medium Standing marches no UE support x 10 reps B Not easy Gait 4 x 25 ft with horizontal head turns, improved performance as he progresses   Added modified exercises to HEP, see bolded below  Gait 2 x 230 ft at mod I level performing L/R head turns to find cards and naming color of card. Pt does have to increase his turn to the L in order to find card due to homonymous hemianopsia. He also exhibits decreased gait speed during task. Good awareness of obstacles during gait.  5 Blaze pods on random setting for improved L visual scanning.  Performed on 1 minute intervals with 30 rest periods.  Pt requires CGA guarding. Pods placed 2 on floor laterally to patient and 3 on mirror, 2 in L visual field. Round 1:  13 hits. Round 2:  12 hits. Round 3:  13 hits. Notable errors/deficits:  needs increased time and cues to scan to the L     PATIENT EDUCATION: Education details: continue HEP, revised HEP Person educated: Patient Education method: Explanation, Demonstration, Verbal cues, and Handouts Education comprehension: verbalized understanding, returned demonstration, and needs further education  HOME EXERCISE PROGRAM: Access Code: [email or web address] URL: [email or web address] Date: 02/07/2024 Prepared by: Sheffield Senate  Exercises - Spanish Squat With Resistance  - 1 x daily - 7 x weekly - 3 sets - 10 reps - Romberg Stance Eyes Closed on Foam Pad  - 1-2 x daily - 4-5 x weekly - 3 sets - 30 hold - Romberg Stance on Foam Pad with Head Rotation  - 1-2 x daily  - 4-5 x weekly - 2 sets - 10 reps and 10 reps head nods  - Walking with Head Rotation  - 1-2 x daily - 4-5 x weekly - 3 sets - Standing Marching  - 1-2 x daily - 4-5 x weekly - 3 sets - Wide Tandem Stance on Foam Pad with Eyes Closed  - 1-2 x daily - 4-5 x weekly - 2 sets - 10 reps   GOALS: Goals reviewed with patient? Yes  SHORT TERM GOALS: Target date: 02/13/2024   Pt will be independent with initial HEP for improved strength, balance, transfers and gait. Baseline: has not worked on yet (9/16) Goal status: IN PROGRESS  2.  Pt will improve 5 x STS to less than or equal to 17 seconds to demonstrate improved functional strength and transfer efficiency.  Baseline: 20.75 sec no UE (8/25), 20.25 sec no UE (9/16) Goal status: IN PROGRESS  3.  Pt will improve gait velocity to at least 2.75 ft/sec for improved gait efficiency and performance at SBA level  Baseline: 2.56 ft/sec no AD SBA (8/25), 3.2 ft/sec no AD SBA (9/16) Goal status: MET  4.  Pt will improve FGA to 19/30 for decreased fall risk  Baseline: 15/30 (8/25), 20/30 (9/16) Goal status: MET    LONG TERM GOALS: Target date: 03/05/2024    Pt will be independent with final HEP for improved strength, balance, transfers and gait. Baseline:  Goal status: INITIAL  2.  Pt will improve 5 x STS to less than or equal to 14 seconds to demonstrate improved functional strength and transfer efficiency.  Baseline: 20.75 sec no UE (8/25), 20.25 sec no UE (9/16) Goal status: INITIAL  3.  Pt will improve gait velocity to at least 3.5 ft/sec for improved gait efficiency and performance at mod I level  Baseline:  2.56 ft/sec no AD SBA (8/25), 3.2 ft/sec no AD SBA (9/16) Goal status: REVISED/UPGRADED  4.  Pt will improve FGA to 23/30 for decreased fall risk  Baseline: 15/30 (8/25), 20/30 (9/16) Goal status: INITIAL  5.  Pt will demonstrate good ability to compensate for his visual impairments in order to decrease his fall risk and be able  to name 2 strategies for compensation. Baseline: visual impairments not assessed at eval due to time constraints Goal status: INITIAL    ASSESSMENT:  CLINICAL IMPRESSION: Emphasis of skilled PT session on continuing to revise his HEP*** assessing STG and initiating review of his prescribed HEP. Pt has met 2/2 STG due to improving his gait speed from 2.56 ft/sec initially to 3.2 ft/sec this date and improving his score on the FGA from 15/30 initially to 20/30 this date indicating a decreased fall risk. He did slightly improve his 5xSTS score but not quite enough to meet his STG. Finally, he has had not tried working on his HEP at home yet but is making progress towards that goal. Remainder of session focus on reviewing his HEP and revising exercises to make them more challenging, upgraded sit to stands to Spanish squats and will review remainder of HEP in future sessions. Overall he feels like his vision in improving and/or he is better able to compensate for impairments. He continues to benefit from skilled PT services to work on improving his strength and functional balance in order to decrease his fall risk. Continue POC.   OBJECTIVE IMPAIRMENTS: Abnormal gait, decreased balance, decreased cognition, decreased knowledge of condition, decreased knowledge of use of DME, difficulty walking, decreased strength, impaired perceived functional ability, and impaired vision/preception.   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   REHAB POTENTIAL: Good  CLINICAL DECISION MAKING: Unstable/unpredictable  EVALUATION COMPLEXITY: High  PLAN:  PT  FREQUENCY: 2x/week  PT DURATION: 6 weeks  PLANNED INTERVENTIONS: 02835- PT Re-evaluation, 97750- Physical Performance Testing, 97110-Therapeutic exercises, 97530- Therapeutic activity, 97112- Neuromuscular re-education, 97535- Self Care, 02859- Manual therapy, [phone]- Gait training, [phone] (1-2 muscles), [phone] (3+ muscles)- Dry Needling, Patient/Family education, Balance training, Stair training, Taping, Joint mobilization, Spinal mobilization, Vestibular training, Visual/preceptual remediation/compensation, Cognitive remediation, DME instructions, Cryotherapy, and Moist heat  PLAN FOR NEXT SESSION: seizures, add to HEP as needed, work on compensations for L visual field impairments, simple dual tasking, work on [employer] manager, finish reviewing/revising HEP***   Waddell Southgate, PT, DPT, CSRS  02/16/24 3:34 PM

## 2024-02-16 NOTE — Patient Instructions (Signed)
 [facility] - A DEPT OF [facility]  Discharge Instructions: Thank you for choosing [facility] to provide your oncology and hematology care.   If you have a lab appointment with the Cancer Center, please go directly to the Cancer Center and check in at the registration area.   Wear comfortable clothing and clothing appropriate for easy access to any Portacath or PICC line.   We strive to give you quality time with your provider. You may need to reschedule your appointment if you arrive late (15 or more minutes).  Arriving late affects you and other patients whose appointments are after yours.  Also, if you miss three or more appointments without notifying the office, you may be dismissed from the clinic at the provider's discretion.      For prescription refill requests, have your pharmacy contact our office and allow 72 hours for refills to be completed.    Today you received the following chemotherapy and/or immunotherapy agents: bevacizumab -adcd (VEGZELMA )      To help prevent nausea and vomiting after your treatment, we encourage you to take your nausea medication as directed.  BELOW ARE SYMPTOMS THAT SHOULD BE REPORTED IMMEDIATELY: *FEVER GREATER THAN 100.4 F (38 C) OR HIGHER *CHILLS OR SWEATING *NAUSEA AND VOMITING THAT IS NOT CONTROLLED WITH YOUR NAUSEA MEDICATION *UNUSUAL SHORTNESS OF BREATH *UNUSUAL BRUISING OR BLEEDING *URINARY PROBLEMS (pain or burning when urinating, or frequent urination) *BOWEL PROBLEMS (unusual diarrhea, constipation, pain near the anus) TENDERNESS IN MOUTH AND THROAT WITH OR WITHOUT PRESENCE OF ULCERS (sore throat, sores in mouth, or a toothache) UNUSUAL RASH, SWELLING OR PAIN  UNUSUAL VAGINAL DISCHARGE OR ITCHING   Items with * indicate a potential emergency and should be followed up as soon as possible or go to the Emergency Department if any problems should occur.  Please show the CHEMOTHERAPY ALERT CARD  or IMMUNOTHERAPY ALERT CARD at check-in to the Emergency Department and triage nurse.  Should you have questions after your visit or need to cancel or reschedule your appointment, please contact [facility] - A DEPT OF [facility]  Dept: [phone]  and follow the prompts.  Office hours are 8:00 a.m. to 4:30 p.m. Monday - Friday. Please note that voicemails left after 4:00 p.m. may not be returned until the following business day.  We are closed weekends and major holidays. You have access to a nurse at all times for urgent questions. Please call the main number to the clinic Dept: [phone] and follow the prompts.   For any non-urgent questions, you may also contact your provider using MyChart. We now offer e-Visits for anyone 48 and older to request care online for non-urgent symptoms. For details visit [email or web address].   Also download the MyChart app! Go to the app store, search MyChart, open the app, select [facility], and log in with your MyChart username and password.

## 2024-02-18 ENCOUNTER — Other Ambulatory Visit (HOSPITAL_COMMUNITY): Payer: Self-pay

## 2024-02-20 ENCOUNTER — Other Ambulatory Visit: Payer: Self-pay

## 2024-02-20 ENCOUNTER — Telehealth: Payer: Self-pay | Admitting: Internal Medicine

## 2024-02-20 NOTE — Telephone Encounter (Signed)
 Scheduled appointments per WQ. Talked with the patient and he is aware of the made appointments.

## 2024-02-21 ENCOUNTER — Ambulatory Visit: Admitting: Physical Therapy

## 2024-02-21 ENCOUNTER — Ambulatory Visit: Admitting: Occupational Therapy

## 2024-02-21 ENCOUNTER — Other Ambulatory Visit: Payer: Self-pay

## 2024-02-21 ENCOUNTER — Other Ambulatory Visit (HOSPITAL_COMMUNITY): Payer: Self-pay

## 2024-02-21 NOTE — Progress Notes (Signed)
 Specialty Pharmacy Refill Coordination Note  Randall Lewis is a 68 y.o. male contacted today regarding refills of specialty medication(s) Lomustine  (GLEOSTINE )   Patient requested [name] at [facility] at [place] date: 02/21/24   Medication will be filled on 02/21/24.

## 2024-02-22 ENCOUNTER — Other Ambulatory Visit: Payer: Self-pay

## 2024-02-23 ENCOUNTER — Ambulatory Visit

## 2024-02-28 ENCOUNTER — Ambulatory Visit: Admitting: Occupational Therapy

## 2024-02-28 ENCOUNTER — Ambulatory Visit: Admitting: Physical Therapy

## 2024-02-28 ENCOUNTER — Encounter: Payer: Self-pay | Admitting: Occupational Therapy

## 2024-02-28 ENCOUNTER — Other Ambulatory Visit: Payer: Self-pay

## 2024-02-28 VITALS — BP 112/78 | HR 60

## 2024-02-28 DIAGNOSIS — M6281 Muscle weakness (generalized): Secondary | ICD-10-CM

## 2024-02-28 DIAGNOSIS — R296 Repeated falls: Secondary | ICD-10-CM

## 2024-02-28 DIAGNOSIS — R2689 Other abnormalities of gait and mobility: Secondary | ICD-10-CM

## 2024-02-28 DIAGNOSIS — R2681 Unsteadiness on feet: Secondary | ICD-10-CM

## 2024-02-28 DIAGNOSIS — R41842 Visuospatial deficit: Secondary | ICD-10-CM

## 2024-02-28 DIAGNOSIS — R29898 Other symptoms and signs involving the musculoskeletal system: Secondary | ICD-10-CM

## 2024-02-28 DIAGNOSIS — R29818 Other symptoms and signs involving the nervous system: Secondary | ICD-10-CM

## 2024-02-28 DIAGNOSIS — R278 Other lack of coordination: Secondary | ICD-10-CM

## 2024-02-28 NOTE — Therapy (Signed)
 OUTPATIENT OCCUPATIONAL THERAPY NEURO EVALUATION  Patient Name: Randall Lewis MRN: [id number] DOB:09-15-1955, 68 y.o., male Today's Date: 02/28/2024  PCP: Yolande Toribio MATSU, MD  REFERRING PROVIDER: Vaslow, Zachary K, MD  END OF SESSION:  OT End of Session - 02/28/24 1455     Visit Number 1    Number of Visits 13    Date for Recertification  04/10/24    Authorization Type Medicare    OT Start Time 1455    OT Stop Time 1533    OT Time Calculation (min) 38 min    Activity Tolerance Patient tolerated treatment well    Behavior During Therapy WFL for tasks assessed/performed          Past Medical History:  Diagnosis Date   Anxiety    BPH (benign prostatic hyperplasia)    Cancer (HCC) 05/31/2004   Brain tumor, in remission after chemo radiation and surgery   Diabetes mellitus without complication (HCC)    ED (erectile dysfunction)    Heart murmur    History of heart murmur   Hyperlipidemia    Hypertension    Sleep apnea    USES C-PAP   UTI (urinary tract infection)    Past Surgical History:  Procedure Laterality Date   BRAIN SURGERY  2006   BRAIN TUMOR  - CANCER   COLONOSCOPY     CYSTOSCOPY N/A 07/29/2015   Procedure: FLEXIBLE CYSTOSCOPY;  Surgeon: Norleen Seltzer, MD;  Location: [facility];  Service: Urology;  Laterality: N/A;   FOOT BONE EXCISION  1989   PENILE PROSTHESIS IMPLANT N/A 07/29/2015   Procedure: PENILE PROSTHESIS;  Surgeon: Norleen Seltzer, MD;  Location: [facility];  Service: Urology;  Laterality: N/A;   right knee meniscus repair  2010   THYROID  LOBECTOMY  05/12/2012   Procedure: THYROID  LOBECTOMY;  Surgeon: Krystal CHRISTELLA Spinner, MD;  Location: [facility];  Service: General;  Laterality: Left;  left thyroid  lobectomy   Patient Active Problem List   Diagnosis Date Noted   Seizures (HCC) 01/16/2024   Depression 01/16/2024   Essential hypertension 01/16/2024   Diabetes mellitus type 2 in nonobese (HCC) 01/16/2024   Acute metabolic acidosis 01/16/2024   Obesity, Class I,  BMI 30-34.9 01/16/2024   Hx of colonic polyps 10/18/2023   Abdominal pain 10/18/2023   Persistent hypersomnia 02/08/2022   Parasomnia associated with another health condition 03/20/2021   Non-restorative sleep 03/20/2021   Nightmares REM-sleep type 02/05/2021   Excessive daytime sleepiness 02/05/2021   OSA on CPAP 02/05/2021   Glioblastoma, IDH-wildtype (HCC) 02/05/2021   Erectile dysfunction 07/29/2015   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 Had bought a motorcycle before vision became an issue but is considering selling it.   He prefers for his medications to be separated in blister packets per day and time.   States his vision started changing in July.   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 male with prior history of left frontal anaplastic oligodendroglioma status postcraniotomy and resection on 03/2005 status post 6 weeks of radiation therapy and concurrent Temodar  ending on 06/22/2005, diabetes mellitus type 2, hypertension, hyperlipidemia, BPH, sleep apnea who had craniotomy on 04/18/2023 for glioblastoma presently on chemo and Avastin  being followed at [facility] and also Dr. Buckley was brought to the ER after patient was found to  have left-sided weakness following which patient had seizure-like activity on the left side at around 9 PM yesterday at home.  Patient has been having headache for the last 24 hours and he tried some Tylenol  and also some leftover oxycodone  the headache eased up little bit.  Later in the evening around 9 PM when he was walking to the bathroom when he had a fall but did not hit his head or lose consciousness.  Patient's wife states he was with him she helped him to the bathroom and while on the commode he started developing left-sided weakness with seizure and EMS was called.  Patient's wife states that over the last 3 weeks he has been having some poor vision in the left eye.  PRECAUTIONS: Fall and Other: seizures   WEIGHT BEARING RESTRICTIONS: No  PAIN:  Are you having pain? No  FALLS: Has patient fallen in last 6 months? Yes. Number of falls too many to count, had 3 on 8/17 when he had his seizure that led to ED visit   LIVING ENVIRONMENT: Lives with: lives with their spouse (but she works during the day) Lives in: House/apartment Stairs: Yes: Internal: 12 steps; on right going up and External: 2-4 steps; on right going up Has following equipment at home: None  PLOF: Independent; not driving; retired from Doctor, hospital, Comptroller, and Lt. Colonel from [employer]; enjoys general repairs  PATIENT GOALS: return to driving; avoid bumping into items  OBJECTIVE:  Note: Objective measures were completed at Evaluation unless otherwise noted.  HAND DOMINANCE: Right  ADLs: Overall ADLs: mod I  IADLs:  Meal Prep: mod I though bumps into things in the kitchen Medication management: mod I  Typing: issues with knowing where the keys are and has issues identifying emails  MOBILITY STATUS: Independent and Hx of falls  ACTIVITY TOLERANCE: Activity tolerance: good to fair  FUNCTIONAL OUTCOME MEASURES: PSFS: 5.7 total score   Total score = sum of the activity  scores/number of activities Minimum detectable change (90%CI) for average score = 2 points Minimum detectable change (90%CI) for single activity score = 3 points   UPPER EXTREMITY ROM and MMT:    BUE: WNL  HAND FUNCTION: Grip strength: Right: 98.1 lbs; Left: 65.6 lbs  COORDINATION: 9 Hole Peg test: Right: 34 sec; Left: 41 sec; with errors to L side  SENSATION: Paresthesias reported upon waking to UEs   EDEMA: none reported or observed  MUSCLE TONE: WFL  COGNITION: Overall cognitive status: Within functional limits for tasks assessed  VISION: Subjective report: Bumps into items on his L side Baseline vision: Wears glasses all the time Visual history: cataracts and brain injury  VISION ASSESSMENT: Impaired  Patient has difficulty with following activities due to following visual impairments: 30* of visual field on the left with inferior cut  PERCEPTION: Impaired: See above  PRAXIS: WFL  OBSERVATIONS: Pt ambulates without use of AD. No loss of balance. The pt appears well kept and has glasses donned. At times demonstrated errors with manipulation  of items in Left visual field.                                                                                                                           TREATMENT :   OT educated pt on rehabilitation process and results of objective measures in relation to pt specific goals.    PATIENT EDUCATION: Education details: OT Role and POC  Person educated: Patient Education method: Explanation Education comprehension: verbalized understanding  HOME EXERCISE PROGRAM: N/A for this visit  GOALS:  SHORT TERM GOALS: Target date: 03/27/2024   Patient will demonstrate initial L UE HEP with 25% verbal cues or less for proper execution. Baseline: Goal status: INITIAL  2.  Patient will demonstrate at least 75 lbs L grip strength as needed to open jars and other containers. Baseline: Right: 98.1 lbs; Left: 65.6 lbs Goal status:  INITIAL  3.  Patient will independently recall at least 2 compensatory strategies for visual impairment without cueing. Baseline:  Goal status: INITIAL  LONG TERM GOALS: Target date: 04/10/2024   Patient will demonstrate updated L UE HEP with visual handouts only for proper execution. Baseline:  Goal status: INITIAL  2.  Patient will report at least two-point increase in average PSFS score or at least three-point increase in a single activity score indicating functionally significant improvement given minimum detectable change.  Baseline: 5.7 total score (See above for individual activity scores) Goal status: INITIAL  3.  Patient will demo improved FM coordination as evidenced by completing nine-hole peg with use of L in 36 seconds or less without errors in L visual field. Baseline: Right: 34 sec; Left: 41 sec Goal status: INITIAL  ASSESSMENT:  CLINICAL IMPRESSION: Patient is a 68 y.o. male who was seen today for occupational therapy evaluation for glioblastoma. 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 Patient currently presents below baseline level of functioning demonstrating functional deficits and impairments as noted below. Pt would benefit from skilled OT services in the outpatient setting to work on impairments as noted below to help pt return to PLOF as able.    PERFORMANCE DEFICITS: in functional skills including ADLs, IADLs, coordination, sensation, strength, Fine motor control, endurance, decreased knowledge of precautions, decreased knowledge of use of DME, vision, and UE functional use.   IMPAIRMENTS: are limiting patient from ADLs, IADLs, rest and sleep, and leisure.   CO-MORBIDITIES: has co-morbidities such as glioblastoma, DM, seizures that affects occupational performance. Patient will benefit from skilled OT to address above impairments and improve overall function.  MODIFICATION  OR ASSISTANCE TO COMPLETE EVALUATION: Min-Moderate modification of tasks or assist with assess necessary to complete an evaluation.  OT OCCUPATIONAL PROFILE AND HISTORY: Detailed assessment: Review of records and additional review of physical, cognitive, psychosocial history related to current functional performance.  CLINICAL DECISION MAKING: Moderate - several treatment options, min-mod task modification necessary  REHAB POTENTIAL: Fair given medical history  EVALUATION COMPLEXITY: Moderate    PLAN:  OT FREQUENCY: 2x/week  OT DURATION: 6 weeks  PLANNED INTERVENTIONS: 97168 OT Re-evaluation, 97535 self care/ADL training, 97110 therapeutic exercise, 97530 therapeutic activity, 97112 neuromuscular re-education, 97018 paraffin, 97039 fluidotherapy, 97010 moist heat, 97750 Physical Performance Testing, functional mobility training, visual/perceptual remediation/compensation, coping strategies training, patient/family education, and DME and/or AE instructions  RECOMMENDED OTHER SERVICES: N/A for this visit  CONSULTED AND AGREED WITH PLAN OF CARE: Patient  PLAN FOR NEXT SESSION: Initiate green putty HEP; visual scanning activities (and handout); vision strategies handout   Sleep positioning Explore hobbies with repairs [address]  Jocelyn HERO [place], OT 02/28/2024, 4:59 PM

## 2024-02-28 NOTE — Therapy (Signed)
 OUTPATIENT PHYSICAL THERAPY NEURO TREATMENT   Patient Name: ALPHA MYSLIWIEC MRN: [id number] DOB:[birth year], 68 y.o., male Today's Date: 02/28/2024   PCP: Yolande Toribio MATSU, MD REFERRING PROVIDER: Laurence Locus, DO  END OF SESSION:  PT End of Session - 02/28/24 1536     Visit Number 7    Number of Visits 13    Date for Recertification  04/02/24    Authorization Type Medicare    Progress Note Due on Visit 10    PT Start Time 1536   from OT eval   PT Stop Time 1618    PT Time Calculation (min) 42 min    Equipment Utilized During Treatment Gait belt    Activity Tolerance Patient tolerated treatment well    Behavior During Therapy WFL for tasks assessed/performed             Past Medical History:  Diagnosis Date   Anxiety    BPH (benign prostatic hyperplasia)    Cancer (HCC) 05/31/2004   Brain tumor, in remission after chemo radiation and surgery   Diabetes mellitus without complication (HCC)    ED (erectile dysfunction)    Heart murmur    History of heart murmur   Hyperlipidemia    Hypertension    Sleep apnea    USES C-PAP   UTI (urinary tract infection)    Past Surgical History:  Procedure Laterality Date   BRAIN SURGERY  2006   BRAIN TUMOR  - CANCER   COLONOSCOPY     CYSTOSCOPY N/A 07/29/2015   Procedure: FLEXIBLE CYSTOSCOPY;  Surgeon: Norleen Seltzer, MD;  Location: [facility];  Service: Urology;  Laterality: N/A;   FOOT BONE EXCISION  1989   PENILE PROSTHESIS IMPLANT N/A 07/29/2015   Procedure: PENILE PROSTHESIS;  Surgeon: Norleen Seltzer, MD;  Location: [facility];  Service: Urology;  Laterality: N/A;   right knee meniscus repair  2010   THYROID  LOBECTOMY  05/12/2012   Procedure: THYROID  LOBECTOMY;  Surgeon: Krystal CHRISTELLA Spinner, MD;  Location: [facility];  Service: General;  Laterality: Left;  left thyroid  lobectomy   Patient Active Problem List   Diagnosis Date Noted   Seizures (HCC) 01/16/2024   Depression 01/16/2024   Essential hypertension 01/16/2024   Diabetes mellitus  type 2 in nonobese (HCC) 01/16/2024   Acute metabolic acidosis 01/16/2024   Obesity, Class I, BMI 30-34.9 01/16/2024   Hx of colonic polyps 10/18/2023   Abdominal pain 10/18/2023   Persistent hypersomnia 02/08/2022   Parasomnia associated with another health condition 03/20/2021   Non-restorative sleep 03/20/2021   Nightmares REM-sleep type 02/05/2021   Excessive daytime sleepiness 02/05/2021   OSA on CPAP 02/05/2021   Glioblastoma, IDH-wildtype (HCC) 02/05/2021   Erectile dysfunction 07/29/2015   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 His vision remains about the same. Pt did see the ophthalmologist and was told that prisms won't work for him due to his impairments. Pt feels like his balance is improving.  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  HPI per ED note 01/15/2024: JAD JOHANSSON is a 68 y.o. male with prior history of left frontal anaplastic oligodendroglioma status postcraniotomy and resection on 03/2005 status post 6 weeks of radiation therapy and concurrent Temodar  ending on 06/22/2005, diabetes mellitus type 2, hypertension, hyperlipidemia, BPH, sleep apnea who had craniotomy  on 04/18/2023 for glioblastoma presently on chemo and Avastin  being followed at [facility] and also Dr. Buckley was brought to the ER after patient was found to have left-sided weakness following which patient had seizure-like activity on the left side at around 9 PM yesterday at home.  Patient has been having headache for the last 24 hours and he tried some Tylenol  and also some leftover oxycodone  the headache eased up little bit.  Later in the evening around 9 PM when he was walking to the bathroom when he had a fall but did not hit his head or lose consciousness.  Patient's wife states he was with him she helped him to the bathroom and while on the commode he started developing left-sided weakness with seizure and EMS was called.  Patient's wife states that over the last 3 weeks he has been having some poor vision in the left eye.  PAIN:  Are you having pain? No  PRECAUTIONS: Fall and Other: seizures   PATIENT GOALS: 100% recovery  OBJECTIVE:  Note: Objective measures were completed at Evaluation unless otherwise noted.  DIAGNOSTIC FINDINGS:   Head CT 01/15/2024 IMPRESSION: 1. Allowing for modality difference, subdural hematoma of 8 mm thickness underlying the right parietal craniotomy site is unchanged compared to the MRI of 12/22/23. 2. Diffuse white matter hypoattenuation throughout the posterior right hemisphere at the area of tumor resection, with improved mass effect on the right lateral ventricle. No midline shift or hydrocephalus. 3. Chronic gliosis in the left frontal white matter, unchanged. 4. Findings communicated to Dr. [name] at 11:19 pm on 01/15/24.  COGNITION: Overall cognitive status: minor cognitive impairments per patient report                                                                                                                               TREATMENT   Self-Care/Home Management Vitals:   02/28/24 1547  BP: 112/78  Pulse: 60   BP  assessed in LUE in sitting at rest, [facility].   TherAct To work on visual scanning to L side Scanning L/R for squigz on mirrors Needs increased cues to scan L environment for squigz Added in stance on airex for increased  challenge Ambulation through obstacle course of chairs Added in cog dual task of counting backwards by 3's and by 4's Decreased speed noted with addition of cog task    PATIENT EDUCATION: Education details: continue HEP, provided contact info for [facility] though pt will likely not be able to return to driving given his visual impairments Person educated: Patient Education method: Explanation, Demonstration, Verbal cues, and Handouts Education comprehension: verbalized understanding, returned demonstration, and needs further education  HOME EXERCISE PROGRAM: Access Code: [email or web address] URL: [email or web address] Date: 02/07/2024 Prepared by: Sheffield Senate  Exercises - Spanish Squat With Resistance  - 1 x daily - 7 x weekly - 3 sets - 10 reps - Romberg Stance Eyes Closed on Foam Pad  - 1-2 x daily - 4-5 x weekly - 3 sets - 30 hold - Romberg Stance on Foam Pad with Head Rotation  - 1-2 x daily - 4-5 x weekly - 2 sets - 10 reps and 10 reps head nods  - Walking with Head Rotation  - 1-2 x daily - 4-5 x weekly - 3 sets - Standing Marching  - 1-2 x daily - 4-5 x weekly - 3 sets - Wide Tandem Stance on Foam Pad with Eyes Closed  - 1-2 x daily - 4-5 x weekly - 2 sets - 10 reps   GOALS: Goals reviewed with patient? Yes  SHORT TERM GOALS: Target date: 02/13/2024   Pt will be independent with initial HEP for improved strength, balance, transfers and gait. Baseline: has not worked on yet (9/16) Goal status: IN PROGRESS  2.  Pt will improve 5 x STS to less than or equal to 17 seconds to demonstrate improved functional strength and transfer efficiency.  Baseline: 20.75 sec no UE (8/25), 20.25 sec no UE (9/16) Goal status: IN PROGRESS  3.  Pt will improve  gait velocity to at least 2.75 ft/sec for improved gait efficiency and performance at SBA level  Baseline: 2.56 ft/sec no AD SBA (8/25), 3.2 ft/sec no AD SBA (9/16) Goal status: MET  4.  Pt will improve FGA to 19/30 for decreased fall risk  Baseline: 15/30 (8/25), 20/30 (9/16) Goal status: MET    LONG TERM GOALS: Target date: 03/05/2024    Pt will be independent with final HEP for improved strength, balance, transfers and gait. Baseline:  Goal status: INITIAL  2.  Pt will improve 5 x STS to less than or equal to 14 seconds to demonstrate improved functional strength and transfer efficiency.  Baseline: 20.75 sec no UE (8/25), 20.25 sec no UE (9/16) Goal status: INITIAL  3.  Pt will improve gait velocity to at least 3.5 ft/sec for improved gait efficiency and performance at mod I level  Baseline:  2.56 ft/sec no AD SBA (8/25), 3.2 ft/sec no AD SBA (9/16) Goal status: REVISED/UPGRADED  4.  Pt will improve FGA to 23/30 for decreased fall risk  Baseline: 15/30 (8/25), 20/30 (9/16) Goal status: INITIAL  5.  Pt will demonstrate good ability to compensate for his visual impairments in order to decrease his fall risk and be able to name 2 strategies for compensation. Baseline: visual impairments not assessed at eval due to time constraints Goal status: INITIAL    ASSESSMENT:  CLINICAL IMPRESSION: Emphasis of skilled PT session on working on visual scanning to the L. Pt continues to exhibit a slow, cautious gait speed through busy environments due to visual impairments and continues to utilize visual scanning strategies reviewed in therapy. He continues to need increased  time to scan to his L side and exhibits more difficulty identifying obstacles with addition of cognitive dual tasks. He continues to benefit from skilled PT services to work on improving his strength and functional balance in order to decrease his fall risk as well as to continue to work on strategies to compensate for  his visual impairments. Continue POC.   OBJECTIVE IMPAIRMENTS: Abnormal gait, decreased balance, decreased cognition, decreased knowledge of condition, decreased knowledge of use of DME, difficulty walking, decreased strength, impaired perceived functional ability, and impaired vision/preception.   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   REHAB POTENTIAL: Good  CLINICAL DECISION MAKING: Unstable/unpredictable  EVALUATION COMPLEXITY: High  PLAN:  PT FREQUENCY: 2x/week  PT DURATION: 6 weeks  PLANNED INTERVENTIONS: 02835- PT Re-evaluation, 97750- Physical Performance Testing, 97110-Therapeutic exercises, 97530- Therapeutic activity, 97112- Neuromuscular re-education, 97535- Self Care, 02859- Manual therapy, [phone]- Gait training, [phone] (1-2 muscles), 20561 (3+ muscles)- Dry Needling, Patient/Family education, Balance training, Stair training, Taping, Joint mobilization, Spinal mobilization, Vestibular training, Visual/preceptual remediation/compensation, Cognitive remediation, DME instructions, Cryotherapy, and Moist heat  PLAN FOR NEXT SESSION: seizures, add to HEP as needed, work on compensations for L visual field impairments, simple dual tasking, work on Heritage manager, how is HEP since it was revised? D/c at end of POC?   Hillel Card, PT, DPT, CSRS  02/28/24 4:19 PM

## 2024-02-29 ENCOUNTER — Other Ambulatory Visit: Payer: Self-pay

## 2024-03-01 ENCOUNTER — Inpatient Hospital Stay: Attending: Internal Medicine

## 2024-03-01 ENCOUNTER — Ambulatory Visit: Attending: Internal Medicine | Admitting: Physical Therapy

## 2024-03-01 ENCOUNTER — Ambulatory Visit: Admitting: Occupational Therapy

## 2024-03-01 ENCOUNTER — Inpatient Hospital Stay: Admitting: Internal Medicine

## 2024-03-01 ENCOUNTER — Inpatient Hospital Stay

## 2024-03-01 VITALS — BP 116/77 | HR 75 | Temp 97.9°F | Resp 20 | Wt 217.5 lb

## 2024-03-01 VITALS — BP 119/77 | HR 60

## 2024-03-01 DIAGNOSIS — E119 Type 2 diabetes mellitus without complications: Secondary | ICD-10-CM | POA: Diagnosis not present

## 2024-03-01 DIAGNOSIS — Z5112 Encounter for antineoplastic immunotherapy: Secondary | ICD-10-CM | POA: Diagnosis present

## 2024-03-01 DIAGNOSIS — C719 Malignant neoplasm of brain, unspecified: Secondary | ICD-10-CM | POA: Diagnosis not present

## 2024-03-01 DIAGNOSIS — R29818 Other symptoms and signs involving the nervous system: Secondary | ICD-10-CM | POA: Diagnosis present

## 2024-03-01 DIAGNOSIS — G4730 Sleep apnea, unspecified: Secondary | ICD-10-CM | POA: Diagnosis not present

## 2024-03-01 DIAGNOSIS — R278 Other lack of coordination: Secondary | ICD-10-CM | POA: Diagnosis present

## 2024-03-01 DIAGNOSIS — C712 Malignant neoplasm of temporal lobe: Secondary | ICD-10-CM | POA: Insufficient documentation

## 2024-03-01 DIAGNOSIS — Z923 Personal history of irradiation: Secondary | ICD-10-CM | POA: Diagnosis not present

## 2024-03-01 DIAGNOSIS — M6281 Muscle weakness (generalized): Secondary | ICD-10-CM | POA: Insufficient documentation

## 2024-03-01 DIAGNOSIS — Z87440 Personal history of urinary (tract) infections: Secondary | ICD-10-CM | POA: Insufficient documentation

## 2024-03-01 DIAGNOSIS — Z87891 Personal history of nicotine dependence: Secondary | ICD-10-CM | POA: Insufficient documentation

## 2024-03-01 DIAGNOSIS — Z79899 Other long term (current) drug therapy: Secondary | ICD-10-CM | POA: Insufficient documentation

## 2024-03-01 DIAGNOSIS — R2689 Other abnormalities of gait and mobility: Secondary | ICD-10-CM | POA: Insufficient documentation

## 2024-03-01 DIAGNOSIS — Z7984 Long term (current) use of oral hypoglycemic drugs: Secondary | ICD-10-CM | POA: Insufficient documentation

## 2024-03-01 DIAGNOSIS — R29898 Other symptoms and signs involving the musculoskeletal system: Secondary | ICD-10-CM

## 2024-03-01 DIAGNOSIS — R296 Repeated falls: Secondary | ICD-10-CM | POA: Diagnosis present

## 2024-03-01 DIAGNOSIS — R569 Unspecified convulsions: Secondary | ICD-10-CM | POA: Insufficient documentation

## 2024-03-01 DIAGNOSIS — N400 Enlarged prostate without lower urinary tract symptoms: Secondary | ICD-10-CM | POA: Insufficient documentation

## 2024-03-01 DIAGNOSIS — Z803 Family history of malignant neoplasm of breast: Secondary | ICD-10-CM | POA: Diagnosis not present

## 2024-03-01 DIAGNOSIS — R112 Nausea with vomiting, unspecified: Secondary | ICD-10-CM | POA: Diagnosis not present

## 2024-03-01 DIAGNOSIS — R41842 Visuospatial deficit: Secondary | ICD-10-CM

## 2024-03-01 DIAGNOSIS — I10 Essential (primary) hypertension: Secondary | ICD-10-CM | POA: Diagnosis not present

## 2024-03-01 DIAGNOSIS — E785 Hyperlipidemia, unspecified: Secondary | ICD-10-CM | POA: Insufficient documentation

## 2024-03-01 DIAGNOSIS — R2681 Unsteadiness on feet: Secondary | ICD-10-CM | POA: Diagnosis present

## 2024-03-01 DIAGNOSIS — C711 Malignant neoplasm of frontal lobe: Secondary | ICD-10-CM | POA: Diagnosis not present

## 2024-03-01 LAB — CMP (CANCER CENTER ONLY)
ALT: 13 U/L (ref 0–44)
AST: 14 U/L — ABNORMAL LOW (ref 15–41)
Albumin: 4.3 g/dL (ref 3.5–5.0)
Alkaline Phosphatase: 51 U/L (ref 38–126)
Anion gap: 8 (ref 5–15)
BUN: 25 mg/dL — ABNORMAL HIGH (ref 8–23)
CO2: 26 mmol/L (ref 22–32)
Calcium: 9.8 mg/dL (ref 8.9–10.3)
Chloride: 103 mmol/L (ref 98–111)
Creatinine: 1.36 mg/dL — ABNORMAL HIGH (ref 0.61–1.24)
GFR, Estimated: 57 mL/min — ABNORMAL LOW (ref >60)
Glucose, Bld: 178 mg/dL — ABNORMAL HIGH (ref 70–99)
Potassium: 3.9 mmol/L (ref 3.5–5.1)
Sodium: 137 mmol/L (ref 135–145)
Total Bilirubin: 1 mg/dL (ref 0.0–1.2)
Total Protein: 7.5 g/dL (ref 6.5–8.1)

## 2024-03-01 LAB — CBC WITH DIFFERENTIAL (CANCER CENTER ONLY)
Abs Immature Granulocytes: 0 K/uL (ref 0.00–0.07)
Basophils Absolute: 0 K/uL (ref 0.0–0.1)
Basophils Relative: 0 %
Eosinophils Absolute: 0 K/uL (ref 0.0–0.5)
Eosinophils Relative: 0 %
HCT: 53.6 % — ABNORMAL HIGH (ref 39.0–52.0)
Hemoglobin: 18.2 g/dL — ABNORMAL HIGH (ref 13.0–17.0)
Immature Granulocytes: 0 %
Lymphocytes Relative: 38 %
Lymphs Abs: 1.2 K/uL (ref 0.7–4.0)
MCH: 26.6 pg (ref 26.0–34.0)
MCHC: 34 g/dL (ref 30.0–36.0)
MCV: 78.5 fL — ABNORMAL LOW (ref 80.0–100.0)
Monocytes Absolute: 0.2 K/uL (ref 0.1–1.0)
Monocytes Relative: 7 %
Neutro Abs: 1.8 K/uL (ref 1.7–7.7)
Neutrophils Relative %: 55 %
Platelet Count: 166 K/uL (ref 150–400)
RBC: 6.83 MIL/uL — ABNORMAL HIGH (ref 4.22–5.81)
RDW: 17.1 % — ABNORMAL HIGH (ref 11.5–15.5)
WBC Count: 3.2 K/uL — ABNORMAL LOW (ref 4.0–10.5)
nRBC: 0 % (ref 0.0–0.2)

## 2024-03-01 MED ORDER — SODIUM CHLORIDE 0.9 % IV SOLN: INTRAVENOUS | Status: DC

## 2024-03-01 MED ORDER — SODIUM CHLORIDE 0.9 % IV SOLN
10.0000 mg/kg | Freq: Once | INTRAVENOUS | Status: AC
  Administered 2024-03-01: 1100 mg via INTRAVENOUS
  Filled 2024-03-01: qty 32

## 2024-03-01 NOTE — Patient Instructions (Signed)
 [facility] - A DEPT OF [facility]  Discharge Instructions: Thank you for choosing [facility] to provide your oncology and hematology care.   If you have a lab appointment with the Cancer Center, please go directly to the Cancer Center and check in at the registration area.   Wear comfortable clothing and clothing appropriate for easy access to any Portacath or PICC line.   We strive to give you quality time with your provider. You may need to reschedule your appointment if you arrive late (15 or more minutes).  Arriving late affects you and other patients whose appointments are after yours.  Also, if you miss three or more appointments without notifying the office, you may be dismissed from the clinic at the provider's discretion.      For prescription refill requests, have your pharmacy contact our office and allow 72 hours for refills to be completed.    Today you received the following chemotherapy and/or immunotherapy agents Bevacizumab       To help prevent nausea and vomiting after your treatment, we encourage you to take your nausea medication as directed.  BELOW ARE SYMPTOMS THAT SHOULD BE REPORTED IMMEDIATELY: *FEVER GREATER THAN 100.4 F (38 C) OR HIGHER *CHILLS OR SWEATING *NAUSEA AND VOMITING THAT IS NOT CONTROLLED WITH YOUR NAUSEA MEDICATION *UNUSUAL SHORTNESS OF BREATH *UNUSUAL BRUISING OR BLEEDING *URINARY PROBLEMS (pain or burning when urinating, or frequent urination) *BOWEL PROBLEMS (unusual diarrhea, constipation, pain near the anus) TENDERNESS IN MOUTH AND THROAT WITH OR WITHOUT PRESENCE OF ULCERS (sore throat, sores in mouth, or a toothache) UNUSUAL RASH, SWELLING OR PAIN  UNUSUAL VAGINAL DISCHARGE OR ITCHING   Items with * indicate a potential emergency and should be followed up as soon as possible or go to the Emergency Department if any problems should occur.  Please show the CHEMOTHERAPY ALERT CARD or IMMUNOTHERAPY  ALERT CARD at check-in to the Emergency Department and triage nurse.  Should you have questions after your visit or need to cancel or reschedule your appointment, please contact [facility] - A DEPT OF [facility]  Dept: [phone]  and follow the prompts.  Office hours are 8:00 a.m. to 4:30 p.m. Monday - Friday. Please note that voicemails left after 4:00 p.m. may not be returned until the following business day.  We are closed weekends and major holidays. You have access to a nurse at all times for urgent questions. Please call the main number to the clinic Dept: [phone] and follow the prompts.   For any non-urgent questions, you may also contact your provider using MyChart. We now offer e-Visits for anyone 64 and older to request care online for non-urgent symptoms. For details visit [email or web address].   Also download the MyChart app! Go to the app store, search MyChart, open the app, select [facility], and log in with your MyChart username and password.

## 2024-03-01 NOTE — Therapy (Signed)
 OUTPATIENT OCCUPATIONAL THERAPY NEURO TREATMENT  Patient Name: Randall Lewis MRN: [id number] DOB:01-Oct-1955, 68 y.o., male Today's Date: 03/01/2024  PCP: Randall Toribio MATSU, MD  REFERRING PROVIDER: Vaslow, Zachary K, MD  END OF SESSION:  OT End of Session - 03/01/24 1614     Visit Number 2    Number of Visits 13    Date for Recertification  04/10/24    Authorization Type Medicare    OT Start Time 1618    OT Stop Time 1703    OT Time Calculation (min) 45 min    Equipment Utilized During Treatment Green putty    Activity Tolerance Patient tolerated treatment well    Behavior During Therapy Randall Lewis for tasks assessed/performed          Past Medical History:  Diagnosis Date   Anxiety    BPH (benign prostatic hyperplasia)    Cancer (HCC) 05/31/2004   Brain tumor, in remission after chemo radiation and surgery   Diabetes mellitus without complication (HCC)    ED (erectile dysfunction)    Heart murmur    History of heart murmur   Hyperlipidemia    Hypertension    Sleep apnea    USES C-PAP   UTI (urinary tract infection)    Past Surgical History:  Procedure Laterality Date   BRAIN SURGERY  2006   BRAIN TUMOR  - CANCER   COLONOSCOPY     CYSTOSCOPY N/A 07/29/2015   Procedure: FLEXIBLE CYSTOSCOPY;  Surgeon: Randall Seltzer, MD;  Location: [facility];  Service: Urology;  Laterality: N/A;   FOOT BONE EXCISION  1989   PENILE PROSTHESIS IMPLANT N/A 07/29/2015   Procedure: PENILE PROSTHESIS;  Surgeon: Randall Seltzer, MD;  Location: [facility];  Service: Urology;  Laterality: N/A;   right knee meniscus repair  2010   THYROID  LOBECTOMY  05/12/2012   Procedure: THYROID  LOBECTOMY;  Surgeon: Randall CHRISTELLA Spinner, MD;  Location: [facility];  Service: General;  Laterality: Left;  left thyroid  lobectomy   Patient Active Problem List   Diagnosis Date Noted   Seizures (HCC) 01/16/2024   Depression 01/16/2024   Essential hypertension 01/16/2024   Diabetes mellitus type 2 in nonobese (HCC) 01/16/2024   Acute  metabolic acidosis 01/16/2024   Obesity, Class I, BMI 30-34.9 01/16/2024   Hx of colonic polyps 10/18/2023   Abdominal pain 10/18/2023   Persistent hypersomnia 02/08/2022   Parasomnia associated with another health condition 03/20/2021   Non-restorative sleep 03/20/2021   Nightmares REM-sleep type 02/05/2021   Excessive daytime sleepiness 02/05/2021   OSA on CPAP 02/05/2021   Glioblastoma, IDH-wildtype (HCC) 02/05/2021   Erectile dysfunction 07/29/2015   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 male with prior history of left frontal anaplastic oligodendroglioma status postcraniotomy and  resection on 03/2005 status post 6 weeks of radiation therapy and concurrent Temodar  ending on 06/22/2005, diabetes mellitus type 2, hypertension, hyperlipidemia, BPH, sleep apnea who had craniotomy on 04/18/2023 for glioblastoma presently on chemo and Avastin  being followed at [facility] and also Dr. Buckley was brought to the ER after patient was found to have left-sided weakness following which patient had seizure-like activity on the left side at around 9 PM yesterday at home.  Patient has been having  headache for the last 24 hours and he tried some Tylenol  and also some leftover oxycodone  the headache eased up little bit.  Later in the evening around 9 PM when he was walking to the bathroom when he had a fall but did not hit his head or lose consciousness.  Patient's wife states he was with him she helped him to the bathroom and while on the commode he started developing left-sided weakness with seizure and EMS was called.  Patient's wife states that over the last 3 weeks he has been having some poor vision in the left eye.  PRECAUTIONS: Fall and Other: seizures   WEIGHT BEARING RESTRICTIONS: No  PAIN:  Are you having pain? No  FALLS: Has patient fallen in last 6 months? Yes. Number of falls too many to count, had 3 on 8/17 when he had his seizure that led to ED visit   LIVING ENVIRONMENT: Lives with: lives with their spouse (but she works during the day) Lives in: House/apartment Stairs: Yes: Internal: 12 steps; on right going up and External: 2-4 steps; on right going up Has following equipment at home: None  PLOF: Independent; not driving; retired from Doctor, hospital, Comptroller, and Lt. Colonel from [employer]; enjoys general repairs  PATIENT GOALS: return to driving; avoid bumping into items  OBJECTIVE:  Note: Objective measures were completed at Evaluation unless otherwise noted.  HAND DOMINANCE: Right  ADLs: Overall ADLs: mod I  IADLs:  Meal Prep: mod I though bumps into things in the kitchen Medication management: mod I  Typing: issues with knowing where the keys are and has issues identifying emails  MOBILITY STATUS: Independent and Hx of falls  ACTIVITY TOLERANCE: Activity tolerance: good to fair  FUNCTIONAL OUTCOME MEASURES: PSFS: 5.7 total score   Total score = sum of the activity scores/number of activities Minimum detectable change (90%CI) for average score = 2 points Minimum detectable change (90%CI) for single activity score = 3  points   UPPER EXTREMITY ROM and MMT:    BUE: WNL  HAND FUNCTION: Grip strength: Right: 98.1 lbs; Left: 65.6 lbs  COORDINATION: 9 Hole Peg test: Right: 34 sec; Left: 41 sec; with errors to L side  SENSATION: Paresthesias reported upon waking to UEs   EDEMA: none reported or observed  MUSCLE TONE: WFL  COGNITION: Overall cognitive status: Within functional limits for tasks assessed  VISION: Subjective report: Bumps into items on his L side Baseline vision: Wears glasses all the time Visual history: cataracts and brain injury  VISION ASSESSMENT: Impaired  Patient has difficulty with following activities due to following visual impairments: 30* of visual field on the left with inferior cut  PERCEPTION: Impaired: See above  PRAXIS: WFL  OBSERVATIONS: Pt ambulates without use of AD. No loss of balance. The pt appears well kept and has glasses donned. At times demonstrated errors with manipulation of items in Left visual field.  TREATMENT :  - Therapeutic activities completed for duration as noted below including:  Initiated Putty Exercises with green putty to begin strengthening, coordination and sensory stimulation of B UEs.  Patient provided visual demonstration, verbal and tactile cues as needed to improve performance of the various exercises/activities including:   - Putty Squeezes - cues to squeeze putty into log for use with other exercises and to fold putty in half with 1 hand  - Putty Rolls - encourage to roll putty into logs with sensory stimulation to entire length of hand, fingers and wrist as needed   - Pinch and Pull with Putty - this motion is combined with different pinches (3-Point Pinch, Tip Pinch, Key Pinch) - patient encouraged to combine tripod, pincer and/or key pinch with pinch and pull motion of putty pulling away from midline,  changing between different pinches and changing different directions to change grip  - Finger Extension with Putty - pt shown how to work on task with all fingers and thumb as well as individual fingers in opposition to thumb  - Finger Adduction with Putty - pt shown how to work on weaving putty between fingers/thumb and then squeeze fingers together while laying hand flat on table top.  - Removing Objects from Putty  - encouraged to hide items (coins, marble, dice etc) and use one hand at a time to find the objects and identify them by tactile input before s/he digs them out and can see them visually.  OT educated patient on theraputty recommendations: avoid small children, pets, hot environments, place in designated container and avoid contact with fabrics. Patient verbalized understanding.    Patient benefited from extra time, verbal/tactile cues, and modeling of task to allow time for processing of verbal instructions and improve motor planning of unfamiliar movements.    OT educated pt on table top play of Golf Solitaire for LUE to address fine motor coordination, gross motor coordination, scanning and locating of items, processing, and sequencing of unfamiliar motor movements or tasks. Pt required minimal cues for proper play.  Tape was placed on patients left side as a visual anchor to promote visual scanning encouraging pt to turn his head all the way to the left and then the rigjt to scan for cards.  PATIENT EDUCATION: Education details: Public house manager, Manufacturing systems engineer Person educated: Patient Education method: Explanation, Demonstration, Tactile cues, Verbal cues, and Handouts Education comprehension: verbalized understanding, returned demonstration, verbal cues required, tactile cues required, and needs further education  HOME EXERCISE PROGRAM: 03/01/2024: Landy putty exercises( access code;[email or web address]), golf solitaire  GOALS:  SHORT TERM GOALS: Target date: 03/27/2024   Patient  will demonstrate initial L UE HEP with 25% verbal cues or less for proper execution. Baseline: Goal status: IN PROGRESS  2.  Patient will demonstrate at least 75 lbs L grip strength as needed to open jars and other containers. Baseline: Right: 98.1 lbs; Left: 65.6 lbs Goal status: IN PROGRESS  3.  Patient will independently recall at least 2 compensatory strategies for visual impairment without cueing. Baseline:  Goal status: IN PROGRESS  LONG TERM GOALS: Target date: 04/10/2024   Patient will demonstrate updated L UE HEP with visual handouts only for proper execution. Baseline:  Goal status: IN PROGRESS  2.  Patient will report at least two-point increase in average PSFS score or at least three-point increase in a single activity score indicating functionally significant improvement given minimum detectable change.  Baseline: 5.7 total score (See above for individual activity scores) Goal status:  IN PROGRESS  3.  Patient will demo improved FM coordination as evidenced by completing nine-hole peg with use of L in 36 seconds or less without errors in L visual field. Baseline: Right: 34 sec; Left: 41 sec Goal status: IN PROGRESS  ASSESSMENT:  CLINICAL IMPRESSION: Patient presents with L sided weakness and Visuospatial deficits secondary to glioblastoma,. He demonstrates fair rehab potential as evidence to verbalizing and demonstrating understanding of HEP. Pt will continue to benefit from skilled outpatient OT to improve L sided weakness and address vision deficits for functional independence in ADLs and IADLs.    PERFORMANCE DEFICITS: in functional skills including ADLs, IADLs, coordination, sensation, strength, Fine motor control, endurance, decreased knowledge of precautions, decreased knowledge of use of DME, vision, and UE functional use.   IMPAIRMENTS: are limiting patient from ADLs, IADLs, rest and sleep, and leisure.   CO-MORBIDITIES: has co-morbidities such as  glioblastoma, DM, seizures that affects occupational performance. Patient will benefit from skilled OT to address above impairments and improve overall function.  OT OCCUPATIONAL PROFILE AND HISTORY: Detailed assessment: Review of records and additional review of physical, cognitive, psychosocial history related to current functional performance.   REHAB POTENTIAL: Fair given medical history   PLAN:  OT FREQUENCY: 2x/week  OT DURATION: 6 weeks  PLANNED INTERVENTIONS: 97168 OT Re-evaluation, 97535 self care/ADL training, 02889 therapeutic exercise, 97530 therapeutic activity, 97112 neuromuscular re-education, 97018 paraffin, 02960 fluidotherapy, 97010 moist heat, 97750 Physical Performance Testing, functional mobility training, visual/perceptual remediation/compensation, coping strategies training, patient/family education, and DME and/or AE instructions  RECOMMENDED OTHER SERVICES: N/A for this visit  CONSULTED AND AGREED WITH PLAN OF CARE: Patient  PLAN FOR NEXT SESSION:  Update HEP Visual scanning activities (and handout); vision strategies handout Sleep positioning Explore hobbies with repairs   Williard Keller, Student-OT 03/01/2024, 5:18 PM

## 2024-03-01 NOTE — Progress Notes (Signed)
 [facility] at [facility] [address]  [place], [place] [address] [phone]   Interval Evaluation  Date of Service: 03/01/24 Patient Name: Randall Lewis Patient MRN: [id number] Patient DOB: 09-29-1955 Provider: Arthea MARLA Manns, MD  Identifying Statement:  Randall Lewis is a 68 y.o. male with right frontal glioblastoma    Oncologic History: Anaplastic oligodendroglioma of frontal lobe (CMS/HHS-HCC)  04/16/2005 Surgery  Craniotomy for resection of left frontal lesion. 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 Lost to follow up after 03/18/2010  05/02/2023 Presentation  Presentation to the [facility] at [facility]. Recommendations pending final pathology. Likely XRT/Temozolomide  as this is a high grade glioma. 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 Pathology reveals Infiltrating High Grade Glioma pending final molecular studies for integrated diagnosis.  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 (chromosomal microarray) Chromosome 10 loss with PTEN loss is detected (chromosomal microarray) 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  No further seizures since prior visit, he continues on Keppra  1000mg  twice per day.  No new or progressive changes reported.  He still reports recognizing faces in people that he doesn't know.  Otherwise his walking is independent.  No headaches or seizures.    H+P (05/19/23) Patient presents for follow up after craniotomy, resection of recurrent primary brain tumor at [facility] on 04/18/23.  He tolerated surgery well, no ill effects.  He is functionally intact, no further headaches, no seizures.  Recently increased blood pressure medication with his PCP.  Has visit with radiation oncology upcoming as well.  Medications: Current Outpatient Medications on File Prior to Visit  Medication Sig Dispense Refill   acetaminophen  (TYLENOL ) 325 MG tablet Take 975 mg by mouth every 6 (six) hours as needed.     amLODipine  (NORVASC ) 5 MG tablet Take 5 mg by mouth daily.     JARDIANCE 25 MG TABS tablet Take 25 mg by mouth daily.     levETIRAcetam  (KEPPRA ) 1000 MG tablet Take 1 tablet (1,000 mg total) by mouth 2 (two) times daily. 180 tablet 0   lomustine  (GLEOSTINE ) 100 MG capsule Take 2 capsules (200 mg total) by mouth  once for 1 dose. Take on an emtpy stomach 1 hour before or 2  hours after meals. Caution:chemotherapy 2 capsule 0   lomustine  (GLEOSTINE ) 40 MG capsule Take 5 capsules (200 mg total) by mouth once for 1 dose. Take on an empty stomach 1 hour before or 2 hours after meals. (Patient not taking: Reported on 02/16/2024) 5 capsule 0   losartan -hydrochlorothiazide  (HYZAAR) 100-12.5 MG tablet Take 1 tablet by mouth daily.     metFORMIN (GLUCOPHAGE-XR) 500 MG 24 hr tablet Take 1,000 mg by mouth daily.     metoprolol  tartrate (LOPRESSOR ) 12.5 mg TABS tablet Take 12.5 mg by mouth daily.     oxyCODONE  (OXY IR/ROXICODONE ) 5 MG immediate release tablet Take 5 mg by mouth every 4 (four) hours as needed.     pioglitazone (ACTOS) 15 MG tablet Take 15 mg by mouth daily.     predniSONE  (DELTASONE ) 50 MG tablet Take 1 tablet at 13 hours, 7 hours, and 1 hour prior to IV contrast 9 tablet 0   simvastatin  (ZOCOR ) 20 MG tablet Take 20 mg by mouth daily.     No current facility-administered medications on file prior to visit.    Allergies:  Allergies  Allergen Reactions   Sildenafil Other (See Comments) and Nausea Only   Gadolinium Derivatives Nausea And Vomiting    Per pt , always get sick with gado and never offered anti nausea meds.   Past Medical History:  Past Medical History:  Diagnosis Date   Anxiety    BPH (benign prostatic hyperplasia)    Cancer (HCC) 05/31/2004   Brain tumor, in remission after chemo radiation and surgery   Diabetes mellitus without complication (HCC)    ED (erectile dysfunction)    Heart murmur    History of heart murmur   Hyperlipidemia    Hypertension    Sleep apnea    USES C-PAP   UTI (urinary tract infection)    Past Surgical History:  Past Surgical History:  Procedure Laterality Date   BRAIN SURGERY  2006   BRAIN TUMOR  - CANCER   COLONOSCOPY     CYSTOSCOPY N/A 07/29/2015   Procedure: FLEXIBLE CYSTOSCOPY;  Surgeon: Norleen Seltzer, MD;  Location: [facility];  Service: Urology;  Laterality: N/A;   FOOT BONE EXCISION  1989   PENILE  PROSTHESIS IMPLANT N/A 07/29/2015   Procedure: PENILE PROSTHESIS;  Surgeon: Norleen Seltzer, MD;  Location: [facility];  Service: Urology;  Laterality: N/A;   right knee meniscus repair  2010   THYROID  LOBECTOMY  05/12/2012   Procedure: THYROID  LOBECTOMY;  Surgeon: Krystal CHRISTELLA Spinner, MD;  Location: [facility];  Service: General;  Laterality: Left;  left thyroid  lobectomy   Social History:  Social History   Socioeconomic History   Marital status: Married    Spouse name: [name]   Number of children: 2   Years of education: Not on file   Highest education level: Master's degree (e.g., MA, MS, MEng, MEd, MSW, MBA)  Occupational History   Not on file  Tobacco Use   Smoking status: Former    Current packs/day: 0.00    Average packs/day: 1 pack/day for 23.0 years (23.0 ttl pk-yrs)    Types: Cigarettes    Start date: 04/26/1973    Quit date: 04/26/1996    Years since quitting: 27.8   Smokeless tobacco: Never  Vaping Use   Vaping status: Never Used  Substance and Sexual Activity   Alcohol use: No   Drug use: No  Sexual activity: Not on file  Other Topics Concern   Not on file  Social History Narrative   Lives with wife   Right handed   Caffeine: 2 cups of coffee and 1 glass of tea/soda a day   Social Drivers of Health   Financial Resource Strain: Low Risk  (05/01/2023)   Received from [facility]   Overall Financial Resource Strain (CARDIA)    Difficulty of Paying Living Expenses: Not hard at all  Food Insecurity: Low Risk  (02/20/2024)   Received from [facility]   Hunger Vital Sign    Within the past 12 months, you worried that your food would run out before you got money to buy more: Never true    Within the past 12 months, the food you bought just didn't last and you didn't have money to get more. : Never true  Transportation Needs: No Transportation Needs (02/20/2024)   Received from [facility]    In the past 12 months, has lack of reliable  transportation kept you from medical appointments, meetings, work or from getting things needed for daily living? : No  Physical Activity: Not on file  Stress: Not on file  Social Connections: Unknown (01/16/2024)   Social Connection and Isolation Panel    Frequency of Communication with Friends and Family: More than three times a week    Frequency of Social Gatherings with Friends and Family: More than three times a week    Attends Religious Services: Not on file    Active Member of Clubs or Organizations: Yes    Attends Banker Meetings: More than 4 times per year    Marital Status: Married  Catering manager Violence: Not At Risk (01/16/2024)   Humiliation, Afraid, Rape, and Kick questionnaire    Fear of Current or Ex-Partner: No    Emotionally Abused: No    Physically Abused: No    Sexually Abused: No   Family History:  Family History  Problem Relation Age of Onset   Breast cancer Mother    Cancer Brother 29   Heart attack Brother    Heart attack Brother    Birth defects Son    Colon cancer Neg Hx    Liver disease Neg Hx    Esophageal cancer Neg Hx     Review of Systems: Constitutional: Doesn't report fevers, chills or abnormal weight loss Eyes: Doesn't report blurriness of vision Ears, nose, mouth, throat, and face: Doesn't report sore throat Respiratory: Doesn't report cough, dyspnea or wheezes Cardiovascular: Doesn't report palpitation, chest discomfort  Gastrointestinal:  Doesn't report nausea, constipation, diarrhea GU: Doesn't report incontinence Skin: Doesn't report skin rashes Neurological: Per HPI Musculoskeletal: Doesn't report joint pain Behavioral/Psych: Doesn't report anxiety  Physical Exam: There were no vitals filed for this visit.  KPS: 70 General: Alert, cooperative, pleasant, in no acute distress Head: Normal EENT: No conjunctival injection or scleral icterus.  Lungs: Resp effort normal Cardiac: Regular rate Abdomen: Non-distended  abdomen Skin: No rashes cyanosis or petechiae. Extremities: No clubbing or edema  Neurologic Exam: Mental Status: Awake, alert, attentive to examiner. Oriented to self and environment. Language is fluent with intact comprehension.  Cranial Nerves: Visual acuity is grossly normal. Left hemianopia. Extra-ocular movements intact. No ptosis. Face is symmetric Motor: Tone and bulk are normal. Power is full in both arms and legs. Reflexes are symmetric, no pathologic reflexes present.  Sensory: Intact to light touch Gait: Normal.   Labs: I have  reviewed the data as listed    Component Value Date/Time   NA 135 01/16/2024 0252   K 4.1 01/16/2024 0252   CL 104 01/16/2024 0252   CO2 23 01/16/2024 0252   GLUCOSE 115 (H) 01/16/2024 0252   BUN 14 01/16/2024 0252   CREATININE 1.17 01/16/2024 0252   CREATININE 1.30 (H) 12/26/2023 1109   CREATININE 1.31 (H) 02/25/2012 1438   CALCIUM 8.7 (L) 01/16/2024 0252   PROT 6.0 (L) 01/16/2024 0252   ALBUMIN 3.2 (L) 01/16/2024 0252   AST 18 01/16/2024 0252   AST 13 (L) 12/26/2023 1109   ALT 15 01/16/2024 0252   ALT 17 12/26/2023 1109   ALKPHOS 42 01/16/2024 0252   BILITOT 0.8 01/16/2024 0252   BILITOT 0.7 12/26/2023 1109   GFRNONAA >60 01/16/2024 0252   GFRNONAA >60 12/26/2023 1109   GFRNONAA >60 02/25/2012 1438   GFRAA >60 08/14/2019 1438   GFRAA >60 02/25/2012 1438   Lab Results  Component Value Date   WBC 3.2 (L) 03/01/2024   NEUTROABS 1.8 03/01/2024   HGB 18.2 (H) 03/01/2024   HCT 53.6 (H) 03/01/2024   MCV 78.5 (L) 03/01/2024   PLT 166 03/01/2024     Assessment/Plan Glioblastoma, IDH-wildtype (HCC)  Randall Lewis is clinically stable today, now on cycle #2, day 15 CCNU/avastin .  Labs are within normal limits today.  Patient elected to continue with cycle #2 oral CCNU 90mg /m2 q6 weeks and avastin  10mg /kg IV 2q weeks.  Avastin  will be helpful as concurrent therapy given burden of enhancing tumor and steroid requirement.  We reviewed  side effects of CCNU, including fatigue, nausea vomiting, cytopenias, ILD.  We reviewed side effects of avastin , including hypertension, bleeding/clotting events, wound healing impairment.  The patient will have a complete blood count, a comprehensive metabolic panel, and urine protein performed prior to each avastin  infusion. Labs may need to be performed more often. Zofran  will prescribed for home use for nausea/vomiting.    Informed consent was obtained verbally at bedside to proceed with oral chemotherapy and avastin .  Chemotherapy should be held for the following:  ANC less than 1,000  Platelets less than 100,000  LFT or creatinine greater than 2x ULN  If clinical concerns/contraindications develop   Avastin  should be held for the following:  ANC less than 500  Platelets less than 50,000  LFT or creatinine greater than 2x ULN  If clinical concerns/contraindications develop  He understands he is not safe to drive at this time.  We did recommend following through with PT/OT as arranged by the inpatient team.    Keppra  will con't 1000mg  BID.  We ask that Randall Lewis return to clinic in 2 weeks for cycle #2, day 29 CCNU/avastin , or sooner as needed.  MRI requested for 03/26/24.  All questions were answered. The patient knows to call the clinic with any problems, questions or concerns. No barriers to learning were detected.  The total time spent in the encounter was 30 minutes and more than 50% was on counseling and review of test results   Arthea MARLA Manns, MD Medical Director of Neuro-Oncology [facility] 03/01/24 9:31 AM

## 2024-03-01 NOTE — Therapy (Signed)
 OUTPATIENT PHYSICAL THERAPY NEURO TREATMENT   Patient Name: Randall Lewis MRN: [id number] DOB:1955-12-04, 68 y.o., male Today's Date: 03/01/2024   PCP: Yolande Toribio MATSU, MD REFERRING PROVIDER: Laurence Locus, DO  END OF SESSION:  PT End of Session - 03/01/24 1541     Visit Number 8    Number of Visits 13    Date for Recertification  04/02/24    Authorization Type Medicare    Progress Note Due on Visit 10    PT Start Time 1538   pt arrived late, thought his appt was canceled for PT   PT Stop Time 1620    PT Time Calculation (min) 42 min    Equipment Utilized During Treatment Gait belt    Activity Tolerance Patient tolerated treatment well    Behavior During Therapy WFL for tasks assessed/performed              Past Medical History:  Diagnosis Date   Anxiety    BPH (benign prostatic hyperplasia)    Cancer (HCC) 05/31/2004   Brain tumor, in remission after chemo radiation and surgery   Diabetes mellitus without complication (HCC)    ED (erectile dysfunction)    Heart murmur    History of heart murmur   Hyperlipidemia    Hypertension    Sleep apnea    USES C-PAP   UTI (urinary tract infection)    Past Surgical History:  Procedure Laterality Date   BRAIN SURGERY  2006   BRAIN TUMOR  - CANCER   COLONOSCOPY     CYSTOSCOPY N/A 07/29/2015   Procedure: FLEXIBLE CYSTOSCOPY;  Surgeon: Norleen Seltzer, MD;  Location: [facility];  Service: Urology;  Laterality: N/A;   FOOT BONE EXCISION  1989   PENILE PROSTHESIS IMPLANT N/A 07/29/2015   Procedure: PENILE PROSTHESIS;  Surgeon: Norleen Seltzer, MD;  Location: [facility];  Service: Urology;  Laterality: N/A;   right knee meniscus repair  2010   THYROID  LOBECTOMY  05/12/2012   Procedure: THYROID  LOBECTOMY;  Surgeon: Krystal CHRISTELLA Spinner, MD;  Location: [facility];  Service: General;  Laterality: Left;  left thyroid  lobectomy   Patient Active Problem List   Diagnosis Date Noted   Seizures (HCC) 01/16/2024   Depression 01/16/2024   Essential  hypertension 01/16/2024   Diabetes mellitus type 2 in nonobese (HCC) 01/16/2024   Acute metabolic acidosis 01/16/2024   Obesity, Class I, BMI 30-34.9 01/16/2024   Hx of colonic polyps 10/18/2023   Abdominal pain 10/18/2023   Persistent hypersomnia 02/08/2022   Parasomnia associated with another health condition 03/20/2021   Non-restorative sleep 03/20/2021   Nightmares REM-sleep type 02/05/2021   Excessive daytime sleepiness 02/05/2021   OSA on CPAP 02/05/2021   Glioblastoma, IDH-wildtype (HCC) 02/05/2021   Erectile dysfunction 07/29/2015   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  HPI per ED note 01/15/2024: Randall Lewis is a 68 y.o. male with prior history of left frontal anaplastic oligodendroglioma status postcraniotomy and resection on 03/2005 status post 6 weeks of radiation therapy and concurrent Temodar  ending on 06/22/2005, diabetes mellitus type 2, hypertension, hyperlipidemia, BPH, sleep apnea who had craniotomy on 04/18/2023  for glioblastoma presently on chemo and Avastin  being followed at [facility] and also Dr. Buckley was brought to the ER after patient was found to have left-sided weakness following which patient had seizure-like activity on the left side at around 9 PM yesterday at home.  Patient has been having headache for the last 24 hours and he tried some Tylenol  and also some leftover oxycodone  the headache eased up little bit.  Later in the evening around 9 PM when he was walking to the bathroom when he had a fall but did not hit his head or lose consciousness.  Patient's wife states he was with him she helped him to the bathroom and while on the commode he started developing left-sided weakness with seizure and EMS was called.  Patient's wife states that over the last 3 weeks he has been having some poor vision in the left eye.  PAIN:  Are you having pain? No  PRECAUTIONS: Fall and Other: seizures   PATIENT GOALS: 100% recovery  OBJECTIVE:  Note: Objective measures were completed at Evaluation unless otherwise noted.  DIAGNOSTIC FINDINGS:   Head CT 01/15/2024 IMPRESSION: 1. Allowing for modality difference, subdural hematoma of 8 mm thickness underlying the right parietal craniotomy site is unchanged compared to the MRI of 12/22/23. 2. Diffuse white matter hypoattenuation throughout the posterior right hemisphere at the area of tumor resection, with improved mass effect on the right lateral ventricle. No midline shift or hydrocephalus. 3. Chronic gliosis in the left frontal white matter, unchanged. 4. Findings communicated to Dr. [name] at 11:19 pm on 01/15/24.  COGNITION: Overall cognitive status: minor cognitive impairments per patient report                                                                                                                               TREATMENT   Self-Care/Home Management Vitals:   03/01/24 1545  BP: 119/77  Pulse: 60   BP assessed in LUE in  sitting at rest, [facility].   TherAct To work on visual scanning to L side Card scan with horizontal head turns during gait x 345 ft with close SBA for balance Pt requires 5x as long to find card in L visual field as compared to his R visual field  To work on balance reactions and dynamic strengthening: On rockerboard in // bars in A/P direction with no UE  support: 2# dowel rod chest press x 10 reps 7# dowel rod chest press 2 x 10 reps Horizontal head turns L/R 2 x 10 reps each direction One posterior LOB, able to correct with cues for activation of PF and glutes Mini-squats 2 x 10 reps  One posterior LOB, requires UE support to recover On rockerboard in // bars in M/L direction with no UE support: 2# dowel rod chest press 3 x 10 reps More difficulty with M/L direction, tends to WB on RLE>LLE    PATIENT EDUCATION: Education details: continue HEP, plan to d/c from PT at end of POC next week Person educated: Patient Education method: Explanation, Demonstration, and Verbal cues Education comprehension: verbalized understanding, returned demonstration, and needs further education  HOME EXERCISE PROGRAM: Access Code: [email or web address] URL: [email or web address] Date: 02/07/2024 Prepared by: Sheffield Senate  Exercises - Spanish Squat With Resistance  - 1 x daily - 7 x weekly - 3 sets - 10 reps - Romberg Stance Eyes Closed on Foam Pad  - 1-2 x daily - 4-5 x weekly - 3 sets - 30 hold - Romberg Stance on Foam Pad with Head Rotation  - 1-2 x daily - 4-5 x weekly - 2 sets - 10 reps and 10 reps head nods  - Walking with Head Rotation  - 1-2 x daily - 4-5 x weekly - 3 sets - Standing Marching  - 1-2 x daily - 4-5 x weekly - 3 sets - Wide Tandem Stance on Foam Pad with Eyes Closed  - 1-2 x daily - 4-5 x weekly - 2 sets - 10 reps   GOALS: Goals reviewed with patient? Yes  SHORT TERM GOALS: Target date: 02/13/2024   Pt will be independent with initial HEP for improved strength,  balance, transfers and gait. Baseline: has not worked on yet (9/16) Goal status: IN PROGRESS  2.  Pt will improve 5 x STS to less than or equal to 17 seconds to demonstrate improved functional strength and transfer efficiency.  Baseline: 20.75 sec no UE (8/25), 20.25 sec no UE (9/16) Goal status: IN PROGRESS  3.  Pt will improve gait velocity to at least 2.75 ft/sec for improved gait efficiency and performance at SBA level  Baseline: 2.56 ft/sec no AD SBA (8/25), 3.2 ft/sec no AD SBA (9/16) Goal status: MET  4.  Pt will improve FGA to 19/30 for decreased fall risk  Baseline: 15/30 (8/25), 20/30 (9/16) Goal status: MET    LONG TERM GOALS: Target date: 03/08/2024 (updated to match last scheduled visit within POC)    Pt will be independent with final HEP for improved strength, balance, transfers and gait. Baseline:  Goal status: INITIAL  2.  Pt will improve 5 x STS to less than or equal to 14 seconds to demonstrate improved functional strength and transfer efficiency.  Baseline: 20.75 sec no UE (8/25), 20.25 sec no UE (9/16) Goal status: INITIAL  3.  Pt will improve gait velocity to at least 3.5 ft/sec for improved gait efficiency and performance at mod I level  Baseline:  2.56 ft/sec no AD SBA (8/25), 3.2 ft/sec no AD SBA (9/16) Goal status: REVISED/UPGRADED  4.  Pt will improve FGA to 23/30 for decreased fall risk  Baseline: 15/30 (8/25), 20/30 (9/16) Goal status: INITIAL  5.  Pt will demonstrate good ability to compensate for his visual impairments in order to decrease his fall risk and be able to name 2 strategies for compensation. Baseline: visual impairments not assessed at eval due to  time constraints Goal status: INITIAL    ASSESSMENT:  CLINICAL IMPRESSION: Emphasis of skilled PT session on continuing to work on visual scanning to the L as well as working on dynamic balance and balance reactions. He exhibits overall good activation of ankle and hip strategy for  balance recovery with just two LOB posteriorly where he requires his UE to catch himself. He continues to need increased time to scan to his L side. He continues to benefit from skilled PT services to work on improving his strength and functional balance in order to decrease his fall risk as well as to continue to work on strategies to compensate for his visual impairments. Continue POC.   OBJECTIVE IMPAIRMENTS: Abnormal gait, decreased balance, decreased cognition, decreased knowledge of condition, decreased knowledge of use of DME, difficulty walking, decreased strength, impaired perceived functional ability, and impaired vision/preception.   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   REHAB POTENTIAL: Good  CLINICAL DECISION MAKING: Unstable/unpredictable  EVALUATION COMPLEXITY: High  PLAN:  PT FREQUENCY: 2x/week  PT DURATION: 6 weeks  PLANNED INTERVENTIONS: 02835- PT Re-evaluation, 97750- Physical Performance Testing, 97110-Therapeutic exercises, 97530- Therapeutic activity, 97112- Neuromuscular re-education, 97535- Self Care, 02859- Manual therapy, [phone]- Gait training, [phone] (1-2 muscles), [phone] (3+ muscles)- Dry Needling, Patient/Family education, Balance training, Stair training, Taping, Joint mobilization, Spinal mobilization, Vestibular training, Visual/preceptual remediation/compensation, Cognitive remediation, DME instructions, Cryotherapy, and Moist heat  PLAN FOR NEXT SESSION: seizures, add to HEP as needed, work on compensations for L visual field impairments, simple dual tasking, work on Animator, how is HEP since it was revised? D/c at end of POC   Waddell Southgate, PT, DPT, CSRS  03/01/24 4:21 PM

## 2024-03-01 NOTE — Patient Instructions (Addendum)
   Access Code: [email or web address] URL: [email or web address] Date: 03/01/2024 Prepared by: [email or web address] Gared Gillie  Exercises (green putty) - Putty Squeezes  - 1-2 x daily - 10 reps - Rolling Putty on Table  - 1-2 x daily - 10 reps - Finger Pinch and Pull with Putty  - 1-2 x daily - 10 reps - 3-Point Pinch with Putty  - 1-2 x daily - 10 reps - Tip PUSH with Putty  - 1-2 x daily - 10 reps - Key Pinch with Putty  - 1-2 x daily - 10 reps - Finger Extension with Putty  - 1-2 x daily - 10 reps - Finger Adduction with Putty  - 1-2 x daily - 10 reps - Removing Marbles from Putty  - 1-2 x daily - 10 reps

## 2024-03-06 ENCOUNTER — Encounter: Payer: Self-pay | Admitting: Neurology

## 2024-03-06 ENCOUNTER — Encounter: Payer: Self-pay | Admitting: Physical Therapy

## 2024-03-06 ENCOUNTER — Ambulatory Visit: Payer: Self-pay | Admitting: Neurology

## 2024-03-06 ENCOUNTER — Ambulatory Visit: Admitting: Occupational Therapy

## 2024-03-06 ENCOUNTER — Ambulatory Visit: Admitting: Physical Therapy

## 2024-03-06 VITALS — BP 139/84 | HR 50 | Ht 71.0 in | Wt 221.8 lb

## 2024-03-06 DIAGNOSIS — R41842 Visuospatial deficit: Secondary | ICD-10-CM

## 2024-03-06 DIAGNOSIS — D440 Neoplasm of uncertain behavior of thyroid gland: Secondary | ICD-10-CM | POA: Diagnosis not present

## 2024-03-06 DIAGNOSIS — R569 Unspecified convulsions: Secondary | ICD-10-CM | POA: Diagnosis not present

## 2024-03-06 DIAGNOSIS — D649 Anemia, unspecified: Secondary | ICD-10-CM | POA: Insufficient documentation

## 2024-03-06 DIAGNOSIS — M6281 Muscle weakness (generalized): Secondary | ICD-10-CM | POA: Diagnosis not present

## 2024-03-06 DIAGNOSIS — R2681 Unsteadiness on feet: Secondary | ICD-10-CM

## 2024-03-06 DIAGNOSIS — C712 Malignant neoplasm of temporal lobe: Secondary | ICD-10-CM

## 2024-03-06 DIAGNOSIS — C719 Malignant neoplasm of brain, unspecified: Secondary | ICD-10-CM

## 2024-03-06 DIAGNOSIS — R2689 Other abnormalities of gait and mobility: Secondary | ICD-10-CM

## 2024-03-06 DIAGNOSIS — R29818 Other symptoms and signs involving the nervous system: Secondary | ICD-10-CM

## 2024-03-06 DIAGNOSIS — G4733 Obstructive sleep apnea (adult) (pediatric): Secondary | ICD-10-CM

## 2024-03-06 DIAGNOSIS — C711 Malignant neoplasm of frontal lobe: Secondary | ICD-10-CM | POA: Diagnosis not present

## 2024-03-06 DIAGNOSIS — R278 Other lack of coordination: Secondary | ICD-10-CM

## 2024-03-06 DIAGNOSIS — N320 Bladder-neck obstruction: Secondary | ICD-10-CM | POA: Insufficient documentation

## 2024-03-06 DIAGNOSIS — E041 Nontoxic single thyroid nodule: Secondary | ICD-10-CM | POA: Insufficient documentation

## 2024-03-06 DIAGNOSIS — I739 Peripheral vascular disease, unspecified: Secondary | ICD-10-CM | POA: Insufficient documentation

## 2024-03-06 DIAGNOSIS — R29898 Other symptoms and signs involving the musculoskeletal system: Secondary | ICD-10-CM

## 2024-03-06 MED ORDER — LEVETIRACETAM 1000 MG PO TABS
1000.0000 mg | ORAL_TABLET | Freq: Two times a day (BID) | ORAL | 3 refills | Status: DC
  Filled 2024-04-14 (×2): qty 10, 5d supply, fill #0
  Filled 2024-04-14: qty 170, 85d supply, fill #0
  Filled 2024-04-14: qty 180, 90d supply, fill #0

## 2024-03-06 NOTE — Therapy (Signed)
 OUTPATIENT OCCUPATIONAL THERAPY NEURO TREATMENT  Patient Name: Randall Lewis MRN: [id number] DOB:24-Apr-1956, 68 y.o., male Today's Date: 03/06/2024  PCP: Yolande Toribio MATSU, MD  REFERRING PROVIDER: Vaslow, Zachary K, MD  END OF SESSION:  OT End of Session - 03/06/24 1533     Visit Number 3    Number of Visits 13    Date for Recertification  04/10/24    Authorization Type Medicare    OT Start Time 1535    OT Stop Time 1620    OT Time Calculation (min) 45 min    Equipment Utilized During Treatment Nuts and bolts    Activity Tolerance Patient tolerated treatment well    Behavior During Therapy WFL for tasks assessed/performed          Past Medical History:  Diagnosis Date   Anxiety    BPH (benign prostatic hyperplasia)    Cancer (HCC) 05/31/2004   Brain tumor, in remission after chemo radiation and surgery   Diabetes mellitus without complication (HCC)    ED (erectile dysfunction)    Heart murmur    History of heart murmur   Hyperlipidemia    Hypertension    Sleep apnea    USES C-PAP   UTI (urinary tract infection)    Past Surgical History:  Procedure Laterality Date   BRAIN SURGERY  2006   BRAIN TUMOR  - CANCER   COLONOSCOPY     CYSTOSCOPY N/A 07/29/2015   Procedure: FLEXIBLE CYSTOSCOPY;  Surgeon: Norleen Seltzer, MD;  Location: [facility];  Service: Urology;  Laterality: N/A;   FOOT BONE EXCISION  1989   PENILE PROSTHESIS IMPLANT N/A 07/29/2015   Procedure: PENILE PROSTHESIS;  Surgeon: Norleen Seltzer, MD;  Location: [facility];  Service: Urology;  Laterality: N/A;   right knee meniscus repair  2010   THYROID  LOBECTOMY  05/12/2012   Procedure: THYROID  LOBECTOMY;  Surgeon: Krystal CHRISTELLA Spinner, MD;  Location: [facility];  Service: General;  Laterality: Left;  left thyroid  lobectomy   Patient Active Problem List   Diagnosis Date Noted   Anemia 03/06/2024   Bladder outlet obstruction 03/06/2024   Nontoxic single thyroid  nodule 03/06/2024   Peripheral vascular disease 03/06/2024    Seizures (HCC) 01/16/2024   Depression 01/16/2024   Essential hypertension 01/16/2024   Diabetes mellitus type 2 in nonobese (HCC) 01/16/2024   Acute metabolic acidosis 01/16/2024   Obesity, Class I, BMI 30-34.9 01/16/2024   Hx of colonic polyps 10/18/2023   Abdominal pain 10/18/2023   Incomplete emptying of bladder 05/03/2023   Glioblastoma of temporal lobe (HCC) 04/18/2023   Benign prostatic hyperplasia with urinary retention 04/11/2023   Mixed hyperlipidemia 04/11/2023   Persistent hypersomnia 02/08/2022   Parasomnia associated with another health condition 03/20/2021   Non-restorative sleep 03/20/2021   Nightmares REM-sleep type 02/05/2021   Excessive daytime sleepiness 02/05/2021   OSA on CPAP 02/05/2021   Glioblastoma, IDH-wildtype (HCC) 02/05/2021   Disequilibrium 04/06/2019   History of malignant neoplasm of brain 03/23/2019   Neoplasm of brain (HCC) 03/23/2019   Lipoprotein deficiency disorder 11/21/2018   Encounter for screening for other disorder 11/11/2017   Pain of right shoulder region 07/05/2017   Adjustment disorder with depressed mood 11/01/2016   Secondary polycythemia 10/30/2015   Encounter for general adult medical examination without abnormal findings 10/23/2015   Erectile dysfunction 07/29/2015   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/27/2015   Paresthesia 09/25/2014   Neoplasm of uncertain behavior of thyroid  gland, left lobe 04/26/2012   Retinal hemorrhage  12/29/2011   Encounter for other general examination 06/17/2010   Asymptomatic microscopic hematuria 06/23/2009   Right knee pain 03/24/2009   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 and also Dr. Buckley was brought to the ER after patient was found to have left-sided weakness following which patient had seizure-like activity on the left side at around 9 PM yesterday at home.  Patient has been having headache for the last 24 hours and he tried some Tylenol  and also some leftover oxycodone  the headache eased up little bit.  Later in the evening around 9 PM when he was walking to the bathroom when he had a fall but did not hit his head or lose consciousness.  Patient's wife states he was with him she helped him to the bathroom and while on the commode he started developing left-sided weakness with seizure and EMS was called.  Patient's wife states that over the last 3 weeks he has been having some poor vision in the left eye.  PRECAUTIONS: Fall and Other: seizures   WEIGHT BEARING RESTRICTIONS: No  PAIN:  Are you having pain? No  FALLS: Has patient fallen in last 6 months? Yes. Number of falls too many to count, had 3 on 8/17 when he had his seizure that led to ED visit   LIVING ENVIRONMENT: Lives with: lives with their spouse (but she works  during the day) Lives in: House/apartment Stairs: Yes: Internal: 12 steps; on right going up and External: 2-4 steps; on right going up Has following equipment at home: None  PLOF: Independent; not driving; retired from Doctor, hospital, Comptroller, and Lt. Colonel from [employer]; enjoys general repairs  PATIENT GOALS: return to driving; avoid bumping into items  OBJECTIVE:  Note: Objective measures were completed at Evaluation unless otherwise noted.  HAND DOMINANCE: Right  ADLs: Overall ADLs: mod I  IADLs:  Meal Prep: mod I though bumps into things in the kitchen Medication management: mod I  Typing: issues with knowing where the keys are and has issues identifying emails  MOBILITY STATUS: Independent and Hx of falls  ACTIVITY TOLERANCE: Activity tolerance: good to fair  FUNCTIONAL OUTCOME MEASURES: PSFS: 5.7 total score   Total score = sum of the activity scores/number of activities Minimum detectable change (90%CI) for average score = 2 points Minimum detectable change (90%CI) for single activity score = 3 points   UPPER EXTREMITY ROM and MMT:    BUE: WNL  HAND FUNCTION: Grip strength:  Right: 98.1 lbs; Left: 65.6 lbs  COORDINATION: 9 Hole Peg test: Right: 34 sec; Left: 41 sec; with errors to L side  SENSATION: Paresthesias reported upon waking to UEs   EDEMA: none reported or observed  MUSCLE TONE: WFL  COGNITION: Overall cognitive status: Within functional limits for tasks assessed  VISION: Subjective report: Bumps into items on his L side Baseline vision: Wears glasses all the time Visual history: cataracts and brain injury  VISION ASSESSMENT: Impaired  Patient has difficulty with following activities due to following visual impairments: 30* of visual field on the left with inferior cut  PERCEPTION: Impaired: See above  PRAXIS: WFL  OBSERVATIONS: Pt ambulates without use of AD. No loss of balance. The pt appears well kept and  has glasses donned. At times demonstrated errors with manipulation of items in Left visual field.                                                                                                                           TREATMENT : - Therapeutic activities completed for duration as noted below including:   Wrist flex and ext with tan (12.5 lbs) flex bar x 2 min for strength and endurance of affected extremity  Supination with tan (12.5 lbs) flex bar x 2 min for strength and endurance of affected extremity  Pronation with tan (12.5 lbs) flex bar x 2 min for strength and endurance of affected extremity   OT placed 6 UNO cards around the therapy gym, consisting of matching pairs by color and number, to promote environmental scanning. The patient was encouraged to use compensatory strategies such as turning his head all the way to the left and all the way to the right, looking from top to bottom, turning his body all the way around to re-scan the environment, and pacing himself for accuracy while scanning the environment. The activity was graded up to incorporate both visual scanning and memory recall. The patient was instructed to pick up one card at a time upon finding it, then scan the gym to locate its matching pair, working to recall the previously seen card locations. On the first trial, pt missed 5/6 cards. The second trial, pt required cueing to use compensatory strategies to locate remaining cards and was able to locate 4/6.OT observed that the patient frequently missed cards positioned on his left side and those placed at higher or lower levels. It was also observed that when pt looked straight ahead instead of having to turn his head, he was able to better locate the placed cards. Pt encouraged to carryover the above visual  compensatory strategies at home such as when grocery shopping or locating other items around the house. Pt verbalized understanding and willingness to try.   Patient also  engaged in a fine motor coordination activity by screwing various sized nuts and bolts using BUE.  He was able to stabilize the screws with his RUE while using his LUE to place the  washer on the corresponding screw and then screw on the bolt.  - Self-care/home management completed for duration as noted below including:  The patient was also educated on sleep positioning to help alleviate numbness in the LUE. OT encouraged the patient to avoid sleeping or lying on his arms and hands, and instead to use pillows to properly support the upper extremities. The patient verbalized understanding.  PATIENT EDUCATION: Education details: Visual compensatory strategies, sleep positioning Person educated: Patient Education method: Explanation, Demonstration, Tactile cues, and Verbal cues Education comprehension: verbalized understanding, returned demonstration, verbal cues required, tactile cues required, and needs further education  HOME EXERCISE PROGRAM: 03/01/2024: Landy putty exercises( access code;[email or web address]), golf solitaire  GOALS:  SHORT TERM GOALS: Target date: 03/27/2024   Patient will demonstrate initial L UE HEP with 25% verbal cues or less for proper execution. Baseline: Goal status: IN PROGRESS  2.  Patient will demonstrate at least 75 lbs L grip strength as needed to open jars and other containers. Baseline: Right: 98.1 lbs; Left: 65.6 lbs Goal status: IN PROGRESS  3.  Patient will independently recall at least 2 compensatory strategies for visual impairment without cueing. Baseline:  Goal status: IN PROGRESS  LONG TERM GOALS: Target date: 04/10/2024   Patient will demonstrate updated L UE HEP with visual handouts only for proper execution. Baseline:  Goal status: IN PROGRESS  2.  Patient will report at least two-point increase in average PSFS score or at least three-point increase in a single activity score indicating functionally significant improvement given minimum detectable  change.  Baseline: 5.7 total score (See above for individual activity scores) Goal status: IN PROGRESS  3.  Patient will demo improved FM coordination as evidenced by completing nine-hole peg with use of L in 36 seconds or less without errors in L visual field. Baseline: Right: 34 sec; Left: 41 sec Goal status: IN PROGRESS  ASSESSMENT:  CLINICAL IMPRESSION: Patient presents with L sided weakness and Visuospatial deficits secondary to glioblastoma,. He demonstrates fair rehab potential as evidence to verbalizing understanding of HEP and visual compensatory strategies. However, pt has not completed putty exercises since initiation last week. Pt will continue to benefit from skilled outpatient OT to improve L sided weakness and to address vision deficits for functional independence in ADLs and IADLs.    PERFORMANCE DEFICITS: in functional skills including ADLs, IADLs, coordination, sensation, strength, Fine motor control, endurance, decreased knowledge of precautions, decreased knowledge of use of DME, vision, and UE functional use.   IMPAIRMENTS: are limiting patient from ADLs, IADLs, rest and sleep, and leisure.   CO-MORBIDITIES: has co-morbidities such as glioblastoma, DM, seizures that affects occupational performance. Patient will benefit from skilled OT to address above impairments and improve overall function.  OT OCCUPATIONAL PROFILE AND HISTORY: Detailed assessment: Review of records and additional review of physical, cognitive, psychosocial history related to current functional performance.   REHAB POTENTIAL: Fair given medical history   PLAN:  OT FREQUENCY: 2x/week  OT DURATION: 6 weeks  PLANNED INTERVENTIONS: 97168 OT Re-evaluation, 97535 self care/ADL training, 02889 therapeutic exercise, 97530 therapeutic activity, 97112 neuromuscular re-education, 97018 paraffin, 02960 fluidotherapy, 97010 moist heat, 97750 Physical Performance Testing, functional mobility training,  visual/perceptual remediation/compensation, coping strategies training, patient/family education, and DME and/or AE instructions  RECOMMENDED OTHER SERVICES: N/A for this visit  CONSULTED AND AGREED WITH PLAN OF CARE: Patient  PLAN FOR NEXT SESSION:  Update HEP Visual scanning activities (and handout); vision strategies handout Explore hobbies with repairs- nuts and bolts Pipe assembly  Jailon Schaible, Student-OT 03/06/2024, 4:40 PM

## 2024-03-06 NOTE — Progress Notes (Signed)
 Provider:  Dedra Gores, MD  Primary Care Physician:  Yolande Toribio MATSU, MD [address] [place] [address]     Referring Provider: Dr Buckley, Oncology, [facility].      Laurence Locus, Do [address] [place],  [place] [address]          Chief Complaint according to patient   Patient presents with:           The patient has a known brain tumour and has been seen here only for sleep medicine. This visit is to address a new onset seizure.   GLIOBLASTOMA wildtype   [facility] has been consulted abut his vision:  left homonomous hemianopsia.        HISTORY OF PRESENT ILLNESS:  Randall Lewis is a 68 y.o. male patient who is here for revisit 03/06/2024 for NEW PROBLEM : Seizure.  Just had a birthday.   Seen in ER 01-15-2024:  Randall Lewis is a 68 y.o. 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 Buckley was brought to the ER after patient was found to have left-sided weakness following which patient had seizure-like activity on the left side at around 9 PM yesterday at home.  Patient has been having headache for the last 24 hours and he tried some Tylenol  and also some leftover oxycodone  the headache eased up little bit.  Later in the evening around 9 PM when he was walking to the bathroom when he had a fall but did not hit his head or lose consciousness.   Patient's wife states he was with him she helped him to the bathroom and while on the commode he started developing left-sided weakness with  convulsions and EMS was called.   Patient's wife states that over the last 3 weeks he has been having some poor  vision in the left eye.   ED Course: In the ER patient was given 2 mg IV Ativan  and also loading dose of Keppra .  CT head shows nothing acute.  Neurology was consulted.  Patient admitted for new onset seizures.  Labs show sodium of 134 creatinine 1.3 WBC 5.9 hemoglobin 16.3.  At the time of my exam patient is mildly somnolent but answers questions appropriately mild weakness in the left lower extremity.    Seen here for OSA on CPAP Seizures (HCC) Active Problems:   Anaplastic oligodendroglioma of frontal lobe (HCC)     Glioblastoma, IDH-wildtype (HCC)   Depression   Essential hypertension   Diabetes mellitus type 2 in nonobese (HCC)   Hyponatremia     Seizure -    discussed with neurologist Dr. Vanessa.  Patient was given loading dose of Keppra  and Keppra  continued.  Seizure precautions.  Awaiting further recommendations from neurologist. Glioblastoma being followed at [facility] and also Dr. Buckley.  Presently on lomustine  and Avastin . Diabetes mellitus type 2 last hemoglobin A1c in the chart is in November 2024 was 8.  Takes Jardiance and metformin.  Presently on sliding scale coverage. Hypertension will continue with metoprolol  amlodipine  and losartan .  Will hold hydrochlorothiazide  due to mild hyponatremia and increased creatinine. Acute renal failure with hyponatremia could  be from dehydration.  Gently hydrate hold hydrochlorothiazide  due to hyponatremia which is mild.  If creatinine does not improve yet hold losartan .   Hyperlipidemia on statins. Sleep apnea on CPAP at bedtime. Depression on Wellbutrin .  Since patient has seizures neurology recommended stopping Wellbutrin . BPH on tamsulosin .     Review of Systems: Out of a complete 14 system review, the patient complains of only the following symptoms, and all other reviewed systems are negative.:   SLEEPINESS ?  How likely are you to doze in the following situations: 0 = not likely, 1 = slight chance, 2 =  moderate chance, 3 = high chance  Sitting and Reading? 1 Watching Television? 0 Sitting inactive in a public place (theater or meeting)?0 Lying down in the afternoon when circumstances permit? 2 Sitting and talking to someone? 0 Sitting quietly after lunch without alcohol? 2 In a car, while stopped for a few minutes in traffic?0 As a passenger in a car for an hour without a break? 1  Total =  6/ 24    Highly compliant on CPAP, good resolution of apnea.        Social History   Socioeconomic History   Marital status: Married    Spouse name: Randall Lewis   Number of children: 2   Years of education: Not on file   Highest education level: Master's degree (e.g., MA, MS, MEng, MEd, MSW, MBA)  Occupational History   Not on file  Tobacco Use   Smoking status: Former    Current packs/day: 0.00    Average packs/day: 1 pack/day for 23.0 years (23.0 ttl pk-yrs)    Types: Cigarettes    Start date: 04/26/1973    Quit date: 04/26/1996    Years since quitting: 27.8   Smokeless tobacco: Never  Vaping Use   Vaping status: Never Used  Substance and Sexual Activity   Alcohol use: No   Drug use: No   Sexual activity: Not on file  Other Topics Concern   Not on file  Social History Narrative   Lives with wife   Right handed   Caffeine: 2 cups of coffee and 1 glass of tea/soda a day   Social Drivers of Corporate investment banker Strain: Low Risk  (05/01/2023)   Received from [facility]   Overall Financial Resource Strain (CARDIA)    Difficulty of Paying Living Expenses: Not hard at all  Food Insecurity: Low Risk  (02/20/2024)   Received from [facility]   Hunger Vital Sign    Within the past 12 months, you worried that your food would run out before you got money to buy more: Never true    Within the past 12 months, the food you bought just didn't last and you didn't have money to get more. : Never true  Transportation Needs: No Transportation Needs (02/20/2024)    Received from [facility]    In the past 12 months, has lack of reliable transportation kept you from medical appointments, meetings, work or from getting things needed for daily living? : No  Physical Activity: Not on file  Stress: Not on file  Social Connections: Unknown (01/16/2024)   Social Connection and Isolation Panel    Frequency of Communication with Friends and Family: More than three times a week    Frequency of Social Gatherings with Friends and Family: More than three times a week    Attends Religious Services: Not on file    Active  Member of Clubs or Organizations: Yes    Attends Engineer, structural: More than 4 times per year    Marital Status: Married    Family History  Problem Relation Age of Onset   Breast cancer Mother    Cancer Brother 37   Heart attack Brother    Heart attack Brother    Birth defects Son    Colon cancer Neg Hx    Liver disease Neg Hx    Esophageal cancer Neg Hx     Past Medical History:  Diagnosis Date   Anxiety    BPH (benign prostatic hyperplasia)    Cancer (HCC) 05/31/2004   Brain tumor, in remission after chemo radiation and surgery   Diabetes mellitus without complication (HCC)    ED (erectile dysfunction)    Heart murmur    History of heart murmur   Hyperlipidemia    Hypertension    Sleep apnea    USES C-PAP   UTI (urinary tract infection)     Past Surgical History:  Procedure Laterality Date   BRAIN SURGERY  2006   BRAIN TUMOR  - CANCER   COLONOSCOPY     CYSTOSCOPY N/A 07/29/2015   Procedure: FLEXIBLE CYSTOSCOPY;  Surgeon: Norleen Seltzer, MD;  Location: [facility];  Service: Urology;  Laterality: N/A;   FOOT BONE EXCISION  1989   PENILE PROSTHESIS IMPLANT N/A 07/29/2015   Procedure: PENILE PROSTHESIS;  Surgeon: Norleen Seltzer, MD;  Location: [facility];  Service: Urology;  Laterality: N/A;   right knee meniscus repair  2010   THYROID  LOBECTOMY  05/12/2012   Procedure: THYROID  LOBECTOMY;  Surgeon: Krystal CHRISTELLA Spinner, MD;  Location: [facility];  Service: General;  Laterality: Left;  left thyroid  lobectomy     Current Outpatient Medications on File Prior to Visit  Medication Sig Dispense Refill   acetaminophen  (TYLENOL ) 325 MG tablet Take 975 mg by mouth every 6 (six) hours as needed.     amLODipine  (NORVASC ) 5 MG tablet Take 5 mg by mouth daily.     JARDIANCE 25 MG TABS tablet Take 25 mg by mouth daily.     levETIRAcetam  (KEPPRA ) 1000 MG tablet Take 1 tablet (1,000 mg total) by mouth 2 (two) times daily. 180 tablet 0   lomustine  (GLEOSTINE ) 100 MG capsule Take 2 capsules (200 mg total) by mouth once for 1 dose. Take on an emtpy stomach 1 hour before or 2 hours after meals. Caution:chemotherapy 2 capsule 0   losartan -hydrochlorothiazide  (HYZAAR) 100-12.5 MG tablet Take 1 tablet by mouth daily.     metFORMIN (GLUCOPHAGE-XR) 500 MG 24 hr tablet Take 1,000 mg by mouth daily.     metoprolol  tartrate (LOPRESSOR ) 12.5 mg TABS tablet Take 12.5 mg by mouth daily.     oxyCODONE  (OXY IR/ROXICODONE ) 5 MG immediate release tablet Take 5 mg by mouth every 4 (four) hours as needed.     pioglitazone (ACTOS) 15 MG tablet Take 15 mg by mouth daily.     predniSONE  (DELTASONE ) 50 MG tablet Take 1 tablet at 13 hours, 7 hours, and 1 hour prior to IV contrast 9 tablet 0   simvastatin  (ZOCOR ) 20 MG tablet Take 20 mg by mouth daily.     lomustine  (GLEOSTINE ) 40 MG capsule Take 5 capsules (200 mg total) by mouth once for 1 dose. Take on an empty stomach 1 hour before or 2 hours after meals. (Patient not taking: Reported on 03/06/2024) 5 capsule 0   No current facility-administered medications on file  prior to visit.    Allergies  Allergen Reactions   Sildenafil Other (See Comments) and Nausea Only   Gadolinium Derivatives Nausea And Vomiting    Per pt , always get sick with gado and never offered anti nausea meds.     DIAGNOSTIC DATA (LABS, IMAGING, TESTING) - I reviewed patient records, labs, notes, testing and imaging  myself where available.  Lab Results  Component Value Date   WBC 3.2 (L) 03/01/2024   HGB 18.2 (H) 03/01/2024   HCT 53.6 (H) 03/01/2024   MCV 78.5 (L) 03/01/2024   PLT 166 03/01/2024      Component Value Date/Time   NA 137 03/01/2024 0912   K 3.9 03/01/2024 0912   CL 103 03/01/2024 0912   CO2 26 03/01/2024 0912   GLUCOSE 178 (H) 03/01/2024 0912   BUN 25 (H) 03/01/2024 0912   CREATININE 1.36 (H) 03/01/2024 0912   CREATININE 1.31 (H) 02/25/2012 1438   CALCIUM 9.8 03/01/2024 0912   PROT 7.5 03/01/2024 0912   ALBUMIN 4.3 03/01/2024 0912   AST 14 (L) 03/01/2024 0912   ALT 13 03/01/2024 0912   ALKPHOS 51 03/01/2024 0912   BILITOT 1.0 03/01/2024 0912   GFRNONAA 57 (L) 03/01/2024 0912   GFRNONAA >60 02/25/2012 1438   GFRAA >60 08/14/2019 1438   GFRAA >60 02/25/2012 1438   No results found for: CHOL, HDL, LDLCALC, LDLDIRECT, TRIG, CHOLHDL Lab Results  Component Value Date   HGBA1C 6.6 (H) 01/16/2024   No results found for: VITAMINB12 No results found for: TSH  PHYSICAL EXAM:  Vitals:   03/06/24 1422  BP: 139/84  Pulse: (!) 50  SpO2: 98%   No data found. Body mass index is 30.93 kg/m.   Wt Readings from Last 3 Encounters:  03/06/24 221 lb 12.8 oz (100.6 kg)  03/01/24 217 lb 8 oz (98.7 kg)  02/16/24 223 lb 3.2 oz (101.2 kg)     Ht Readings from Last 3 Encounters:  03/06/24 5' 11 (1.803 m)  01/16/24 5' 11 (1.803 m)  10/18/23 5' 11 (1.803 m)      General: The patient is awake, alert and appears not in acute distress and groomed. Head:  surgical scars-  Neck is supple.  Neck is supple. Mallampati ,3 plus   neck circumference:18.5 inches . Nasal airflow  patent.  Retrognathia is seen.  Dental status: / Cardiovascular:  Regular rate and cardiac rhythm by pulse,  without distended neck veins. Respiratory: Lungs are clear to auscultation.  Skin:  Without evidence of ankle edema, or rash. Trunk: The patient's posture is erect.   Neurologic  exam : The patient is awake and alert, oriented to place and time.   Memory subjective described as intact.  Attention span & concentration ability appears normal.  Speech is fluent,  without  dysarthria, dysphonia or aphasia.  Mood and affect are appropriate.   Cranial nerves: no loss of smell or taste reported  Pupils are equal and briskly reactive to light. Funduscopic exam deferred. Normal EOM, but restricted visual field to the right periphery. - no longer driving ( seizures and visual impairment) .  Hearing was intact to soft voice.    Facial sensation intact to fine touch.  Facial motor strength is symmetric and tongue and uvula move midline. Bilateral ptosis,  Neck ROM : rotation, tilt and flexion extension were normal for age and shoulder shrug was symmetrical.    Motor exam:  Symmetric bulk, tone and ROM.   Normal tone without  cog- wheeling, symmetric grip strength .   Sensory:  Fine touch and vibration was normal. At knee and hands, reduced at ankles.   Proprioception tested in the upper extremities was normal.   Coordination: Rapid alternating movements in the fingers/hands were of normal speed.  The Finger-to-nose maneuver was intact without evidence of ataxia, dysmetria or tremor.   Deep tendon reflexes: in the  upper and lower extremities are symmetric and intact.  Babinski response was deferred.     MRI from 02-13-2024   Enhancement associated with the necrotic lesion in the medial aspect of the right occipital and posterior temporal lobes has resolved. There is increased susceptibility and intrinsic T1 hyperintensity in this region suggesting chronic blood products and mineralization. There is also significantly progressive, confluent restricted diffusion which now measures up to approximately 5 cm in diameter with associated heterogeneous T2 hyperintensity. However, the overall extent of T2 hyperintensity throughout the right occipital, posterior temporal, and  parietal lobes has decreased with decreased mass effect including on the right lateral ventricle. A resection cavity is again noted more laterally in the right temporoparietal region with chronic blood products and unchanged mild enhancement.   A resection cavity and mild surrounding T2 hyperintensity in the anterior left frontal lobe are unchanged and without associated enhancement. There is no midline shift or hydrocephalus. Minimal chronic extra-axial fluid is noted subjacent to the right-sided craniotomy. There is mild cerebral atrophy. 02/13/2024 15:38      ASSESSMENT AND PLAN :   68 y.o. year old male  here with:  2 brain tumours . Left frontal and right parietal, both surgically resected. On chemotherapy     1) new onset focal seizure related to  GBM and not to hyponatremia.  Was appropriately started on keppra  and  the medication Wellbutrin  was d/c .    MRI brain: reviewed   EEG: ordered.    Keppra  to be refilled, he denies depression or mood changes on the medication.   OSA on CPA : stay on current  settings.    Follow up q 6 months.    I would like to thank Yolande Toribio MATSU, MD and Laurence Locus, Do [address] [place],  [place] [address] for allowing me to meet with this pleasant patient.   Sleep Clinic Patients are generally offered input on sleep hygiene, life style changes and how to improve compliance with medical treatment where applicable. Review and reiteration of good sleep hygiene measures is offered to any sleep clinic patient, be it in the first consultation or with any follow up visits. Any patient with sleepiness should be cautioned not to drive, work at heights, or operate dangerous or heavy equipment when feeling tired or sleepy.    After spending a total time of  35  minutes face to face and time for  history taking, physical and neurologic examination, review of laboratory studies,  personal review of imaging studies, reports and results  of other testing and review of referral information / records as far as provided in visit,   Electronically signed by: Dedra Gores, MD 03/06/2024 2:38 PM  [facility] Board certified by The [employer] of Sleep Medicine and Diplomate of the [employer] of Sleep Medicine. Board certified In Neurology through the ABPN, Fellow of the [employer] of Neurology.

## 2024-03-06 NOTE — Patient Instructions (Addendum)
 Levetiracetam  Tablets What is this medication? LEVETIRACETAM  (lee ve tye RA se tam) prevents and controls seizures in people with epilepsy. It works by calming overactive nerves in your body. This medicine may be used for other purposes; ask your health care provider or pharmacist if you have questions. COMMON BRAND NAME(S): Keppra , Roweepra  What should I tell my care team before I take this medication? They need to know if you have any of these conditions: Kidney disease Suicidal thoughts, plans, or attempt by you or a family member An unusual or allergic reaction to levetiracetam , other medications, foods, dyes, or preservatives Pregnant or trying to get pregnant Breast-feeding How should I use this medication? Take this medication by mouth with a glass of water . Follow the directions on the prescription label. Swallow the tablets whole. Do not crush or chew this medication. You may take this medication with or without food. Take your doses at regular intervals. Do not take your medication more often than directed. Do not stop taking this medication or any of your seizure medications unless instructed by your care team. Stopping your medication suddenly can increase your seizures or their severity. A special MedGuide will be given to you by the pharmacist with each prescription and refill. Be sure to read this information carefully each time. Contact your care team about the use of this medication in children. While this medication may be prescribed for children as young as 83 years of age for selected conditions, precautions do apply. Overdosage: If you think you have taken too much of this medicine contact a poison control center or emergency room at once. NOTE: This medicine is only for you. Do not share this medicine with others. What if I miss a dose? If you miss a dose, take it as soon as you can. If it is almost time for your next dose, take only that dose. Do not take double or extra  doses. What may interact with this medication? This medication may interact with the following: Carbamazepine Colesevelam Probenecid Sevelamer This list may not describe all possible interactions. Give your health care provider a list of all the medicines, herbs, non-prescription drugs, or dietary supplements you use. Also tell them if you smoke, drink alcohol, or use illegal drugs. Some items may interact with your medicine. What should I watch for while using this medication? Visit your care team for a regular check on your progress. Wear a medical identification bracelet or chain to say you have epilepsy, and carry a card that lists all your medications. This medication may cause serious skin reactions. They can happen weeks to months after starting the medication. Contact your care team right away if you notice fevers or flu-like symptoms with a rash. The rash may be red or purple and then turn into blisters or peeling of the skin. You may also notice a red rash with swelling of the face, lips, or lymph nodes in your neck or under your arms. It is important to take this medication exactly as instructed by your care team. When first starting treatment, your dose may need to be adjusted. It may take weeks or months before your dose is stable. You should contact your care team if your seizures get worse or if you have any new types of seizures. This medication may affect your coordination, reaction time, or judgment. Do not drive or operate machinery until you know how this medication affects you. Sit up or stand slowly to reduce the risk of dizzy or fainting  spells. Drinking alcohol with this medication can increase the risk of these side effects. This medication may cause thoughts of suicide or depression. This includes sudden changes in mood, behaviors, or thoughts. These changes can happen at any time but are more common in the beginning of treatment or after a change in dose. Call your care team  right away if you experience these thoughts or worsening depression. If you become pregnant while using this medication, you may enroll in the [place] American Antiepileptic Drug Pregnancy Registry by calling [phone]. This registry collects information about the safety of antiepileptic medication use during pregnancy. What side effects may I notice from receiving this medication? Side effects that you should report to your care team as soon as possible: Allergic reactions or angioedema--skin rash, itching or hives, swelling of the face, eyes, lips, tongue, arms, or legs, trouble swallowing or breathing Increase in blood pressure in children Infection--fever, chills, cough, or sore throat Loss of balance or coordination Low red blood cell level--unusual weakness or fatigue, dizziness, headache, trouble breathing Mood and behavior changes--anxiety, nervousness, confusion, hallucinations, irritability, hostility, thoughts of suicide or self-harm, worsening mood, feelings of depression Rash, fever, and swollen lymph nodes Redness, swelling, and blistering of the skin over hands and feet Trouble walking Unusual bruising or bleeding Unusual weakness or fatigue Side effects that usually do not require medical attention (report these to your care team if they continue or are bothersome): Dizziness Drowsiness Fatigue Irritability Loss of appetite This list may not describe all possible side effects. Call your doctor for medical advice about side effects. You may report side effects to FDA at 1-800-FDA-1088. Where should I keep my medication? Keep out of reach of children. Store at room temperature between 15 and 30 degrees C (59 and 86 degrees F). Throw away any unused medication after the expiration date. NOTE: This sheet is a summary. It may not cover all possible information. If you have questions about this medicine, talk to your doctor, pharmacist, or health care provider.  2024  Elsevier/Gold Standard (2023-04-29 00:00:00)  ASSESSMENT AND PLAN :   68 y.o. year old male  here with:  2 brain tumours . Left frontal and right parietal, both surgically resected. On chemotherapy     1) new onset focal seizure related to  GBM and not to hyponatremia.  Was appropriately started on keppra  and  the medication Wellbutrin  was d/c .    MRI brain: reviewed   EEG: ordered.    Keppra  to be refilled, he denies depression or mood changes on the medication.  Should the patient develop depression, please consider changing to BREVIACT>   OSA on CPA : stay on current  settings.    Follow up q 6 months.    I would like to thank Yolande Toribio MATSU, MD and Laurence Locus, Do

## 2024-03-06 NOTE — Therapy (Signed)
 OUTPATIENT PHYSICAL THERAPY NEURO TREATMENT   Patient Name: Randall Lewis MRN: [id number] DOB:28-Feb-1956, 68 y.o., male Today's Date: 03/06/2024   PCP: Yolande Toribio MATSU, MD REFERRING PROVIDER: Laurence Locus, DO  END OF SESSION:  PT End of Session - 03/06/24 1510     Visit Number 9    Number of Visits 13    Date for Recertification  04/02/24    Authorization Type Medicare    Progress Note Due on Visit 10    PT Start Time 1508   pt running late from [facility] appt   PT Stop Time 1531    PT Time Calculation (min) 23 min    Equipment Utilized During Treatment Gait belt    Activity Tolerance Patient tolerated treatment well    Behavior During Therapy WFL for tasks assessed/performed              Past Medical History:  Diagnosis Date   Anxiety    BPH (benign prostatic hyperplasia)    Cancer (HCC) 05/31/2004   Brain tumor, in remission after chemo radiation and surgery   Diabetes mellitus without complication (HCC)    ED (erectile dysfunction)    Heart murmur    History of heart murmur   Hyperlipidemia    Hypertension    Sleep apnea    USES C-PAP   UTI (urinary tract infection)    Past Surgical History:  Procedure Laterality Date   BRAIN SURGERY  2006   BRAIN TUMOR  - CANCER   COLONOSCOPY     CYSTOSCOPY N/A 07/29/2015   Procedure: FLEXIBLE CYSTOSCOPY;  Surgeon: Norleen Seltzer, MD;  Location: [facility];  Service: Urology;  Laterality: N/A;   FOOT BONE EXCISION  1989   PENILE PROSTHESIS IMPLANT N/A 07/29/2015   Procedure: PENILE PROSTHESIS;  Surgeon: Norleen Seltzer, MD;  Location: [facility];  Service: Urology;  Laterality: N/A;   right knee meniscus repair  2010   THYROID  LOBECTOMY  05/12/2012   Procedure: THYROID  LOBECTOMY;  Surgeon: Krystal CHRISTELLA Spinner, MD;  Location: [facility];  Service: General;  Laterality: Left;  left thyroid  lobectomy   Patient Active Problem List   Diagnosis Date Noted   Anemia 03/06/2024   Bladder outlet obstruction 03/06/2024   Nontoxic single thyroid  nodule  03/06/2024   Peripheral vascular disease 03/06/2024   Seizures (HCC) 01/16/2024   Depression 01/16/2024   Essential hypertension 01/16/2024   Diabetes mellitus type 2 in nonobese (HCC) 01/16/2024   Acute metabolic acidosis 01/16/2024   Obesity, Class I, BMI 30-34.9 01/16/2024   Hx of colonic polyps 10/18/2023   Abdominal pain 10/18/2023   Incomplete emptying of bladder 05/03/2023   Glioblastoma of temporal lobe (HCC) 04/18/2023   Benign prostatic hyperplasia with urinary retention 04/11/2023   Mixed hyperlipidemia 04/11/2023   Persistent hypersomnia 02/08/2022   Parasomnia associated with another health condition 03/20/2021   Non-restorative sleep 03/20/2021   Nightmares REM-sleep type 02/05/2021   Excessive daytime sleepiness 02/05/2021   OSA on CPAP 02/05/2021   Glioblastoma, IDH-wildtype (HCC) 02/05/2021   Disequilibrium 04/06/2019   History of malignant neoplasm of brain 03/23/2019   Neoplasm of brain (HCC) 03/23/2019   Lipoprotein deficiency disorder 11/21/2018   Encounter for screening for other disorder 11/11/2017   Pain of right shoulder region 07/05/2017   Adjustment disorder with depressed mood 11/01/2016   Secondary polycythemia 10/30/2015   Encounter for general adult medical examination without abnormal findings 10/23/2015   Erectile dysfunction 07/29/2015   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/27/2015  Paresthesia 09/25/2014   Neoplasm of uncertain behavior of thyroid  gland, left lobe 04/26/2012   Retinal hemorrhage 12/29/2011   Encounter for other general examination 06/17/2010   Asymptomatic microscopic hematuria 06/23/2009   Right knee pain 03/24/2009   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 Had a good birthday. Just saw [facility] next door, reports they told him to come back.   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  HPI per ED note 01/15/2024: Randall Lewis is a 68 y.o. 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 Buckley was brought to the ER after patient was found to have left-sided weakness following which patient had seizure-like activity on the left side at around 9 PM yesterday at home.  Patient has been having headache for the last 24 hours and he tried some Tylenol  and also some leftover oxycodone  the headache eased up little bit.  Later in the evening around 9 PM when he was walking to the bathroom when he had a fall but did not hit his head or lose consciousness.  Patient's wife states he was with him she helped him to the bathroom and while on the commode he started developing left-sided weakness with seizure and EMS was called.  Patient's wife states that over the last 3 weeks he has been having some poor vision in the left eye.  PAIN:  Are you having pain? No  PRECAUTIONS: Fall and Other: seizures   PATIENT GOALS: 100% recovery  OBJECTIVE:  Note: Objective measures were completed at Evaluation unless otherwise  noted.  DIAGNOSTIC FINDINGS:   Head CT 01/15/2024 IMPRESSION: 1. Allowing for modality difference, subdural hematoma of 8 mm thickness underlying the right parietal craniotomy site is unchanged compared to  the MRI of 12/22/23. 2. Diffuse white matter hypoattenuation throughout the posterior right hemisphere at the area of tumor resection, with improved mass effect on the right lateral ventricle. No midline shift or hydrocephalus. 3. Chronic gliosis in the left frontal white matter, unchanged. 4. Findings communicated to Dr. [name] at 11:19 pm on 01/15/24.  COGNITION: Overall cognitive status: minor cognitive impairments per patient report                                                                                                                               TREATMENT   NMR:  With 5 blaze pods in a semi-circle on mirror for visual scanning, reaching outside of BOS, weight shifting, reaction times. Performed on rockerboard on random hit setting, intermittent CGA for balance: Performed 4 bouts of 1 minute each: In A/P direction: 16 hits, 21 hits In M/L direction: 15 hits, 25 hits  During first set, pt initially taking more time to find blaze pod on the L side   To work on balance reactions and visual scanning, esp to L side On blue foam beam holding tandem stance, performed head turns to R/L and naming card that therapist was holding, took incr time to find card on L side, performed 2 sets of tandem stance each side    PATIENT EDUCATION: Education details: plan to D/C from PT at next session  Person educated: Patient Education method: Explanation, Demonstration, and Verbal cues Education comprehension: verbalized understanding, returned demonstration, and needs further education  HOME EXERCISE PROGRAM: Access Code: [email or web address] URL: [email or web address] Date: 02/07/2024 Prepared by: Sheffield Senate  Exercises - Spanish Squat With Resistance  - 1 x daily  - 7 x weekly - 3 sets - 10 reps - Romberg Stance Eyes Closed on Foam Pad  - 1-2 x daily - 4-5 x weekly - 3 sets - 30 hold - Romberg Stance on Foam Pad with Head Rotation  - 1-2 x daily - 4-5 x weekly - 2 sets - 10 reps and 10 reps head nods  - Walking with Head Rotation  - 1-2 x daily - 4-5 x weekly - 3 sets - Standing Marching  - 1-2 x daily - 4-5 x weekly - 3 sets - Wide Tandem Stance on Foam Pad with Eyes Closed  - 1-2 x daily - 4-5 x weekly - 2 sets - 10 reps   GOALS: Goals reviewed with patient? Yes  SHORT TERM GOALS: Target date: 02/13/2024   Pt will be independent with initial HEP for improved strength, balance, transfers and gait. Baseline: has not worked on yet (9/16) Goal status: IN PROGRESS  2.  Pt will improve 5 x STS to less than or equal to 17 seconds to demonstrate improved functional strength and transfer efficiency.  Baseline: 20.75 sec no UE (8/25), 20.25 sec no UE (9/16) Goal status: IN PROGRESS  3.  Pt will improve gait velocity to at least 2.75 ft/sec for improved gait  efficiency and performance at SBA level  Baseline: 2.56 ft/sec no AD SBA (8/25), 3.2 ft/sec no AD SBA (9/16) Goal status: MET  4.  Pt will improve FGA to 19/30 for decreased fall risk  Baseline: 15/30 (8/25), 20/30 (9/16) Goal status: MET    LONG TERM GOALS: Target date: 03/08/2024 (updated to match last scheduled visit within POC)    Pt will be independent with final HEP for improved strength, balance, transfers and gait. Baseline:  Goal status: INITIAL  2.  Pt will improve 5 x STS to less than or equal to 14 seconds to demonstrate improved functional strength and transfer efficiency.  Baseline: 20.75 sec no UE (8/25), 20.25 sec no UE (9/16) Goal status: INITIAL  3.  Pt will improve gait velocity to at least 3.5 ft/sec for improved gait efficiency and performance at mod I level  Baseline:  2.56 ft/sec no AD SBA (8/25), 3.2 ft/sec no AD SBA (9/16) Goal status: REVISED/UPGRADED  4.  Pt  will improve FGA to 23/30 for decreased fall risk  Baseline: 15/30 (8/25), 20/30 (9/16) Goal status: INITIAL  5.  Pt will demonstrate good ability to compensate for his visual impairments in order to decrease his fall risk and be able to name 2 strategies for compensation. Baseline: visual impairments not assessed at eval due to time constraints Goal status: INITIAL    ASSESSMENT:  CLINICAL IMPRESSION: Session limited today due to pt arriving late from [facility] neurology appt and having OT right after. Today's skilled session focused on balance strategies with focus on visual scanning to L side. Pt continues to need to take incr time to scan on his L side. Continue POC.   OBJECTIVE IMPAIRMENTS: Abnormal gait, decreased balance, decreased cognition, decreased knowledge of condition, decreased knowledge of use of DME, difficulty walking, decreased strength, impaired perceived functional ability, and impaired vision/preception.   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   REHAB POTENTIAL: Good  CLINICAL DECISION MAKING: Unstable/unpredictable  EVALUATION COMPLEXITY: High  PLAN:  PT FREQUENCY: 2x/week  PT DURATION: 6 weeks  PLANNED INTERVENTIONS: 02835- PT Re-evaluation, 97750- Physical Performance Testing, 97110-Therapeutic exercises, 97530- Therapeutic activity, 97112- Neuromuscular re-education, 97535- Self Care, [id number]- Manual therapy, [phone]- Gait training, [phone] (1-2 muscles), [phone] (3+ muscles)- Dry Needling, Patient/Family education, Balance training, Stair training, Taping, Joint mobilization, Spinal mobilization, Vestibular  training, Visual/preceptual remediation/compensation, Cognitive remediation, DME instructions, Cryotherapy, and Moist heat  PLAN FOR NEXT SESSION: seizures Check goals, and plan to D/C, finalize HEP, will need 10th visit PN   Sheffield Senate, PT, DPT 03/06/24 3:43 PM

## 2024-03-08 ENCOUNTER — Ambulatory Visit: Admitting: Occupational Therapy

## 2024-03-08 ENCOUNTER — Ambulatory Visit: Admitting: Physical Therapy

## 2024-03-08 ENCOUNTER — Other Ambulatory Visit: Payer: Self-pay

## 2024-03-08 VITALS — BP 126/81 | HR 58

## 2024-03-08 DIAGNOSIS — M6281 Muscle weakness (generalized): Secondary | ICD-10-CM

## 2024-03-08 DIAGNOSIS — R29898 Other symptoms and signs involving the musculoskeletal system: Secondary | ICD-10-CM

## 2024-03-08 DIAGNOSIS — R41842 Visuospatial deficit: Secondary | ICD-10-CM

## 2024-03-08 DIAGNOSIS — R2681 Unsteadiness on feet: Secondary | ICD-10-CM

## 2024-03-08 DIAGNOSIS — R29818 Other symptoms and signs involving the nervous system: Secondary | ICD-10-CM

## 2024-03-08 DIAGNOSIS — R278 Other lack of coordination: Secondary | ICD-10-CM

## 2024-03-08 DIAGNOSIS — R2689 Other abnormalities of gait and mobility: Secondary | ICD-10-CM

## 2024-03-08 NOTE — Therapy (Signed)
 OUTPATIENT PHYSICAL THERAPY NEURO TREATMENT - 10th VISIT PROGRESS NOTE and DISCHARGE NOTE   Patient Name: Randall Lewis MRN: [id number] DOB:02/20/1956, 68 y.o., male Today's Date: 03/08/2024   PCP: Randall Toribio MATSU, MD REFERRING PROVIDER: Laurence Locus, DO  PHYSICAL THERAPY DISCHARGE SUMMARY  Visits from Start of Care: 10  Current functional level related to goals / functional outcomes: Mod I   Remaining deficits: Slightly impaired balance, L homonymous hemianopsia   Education / Equipment: Handout for final HEP   Patient agrees to discharge. Patient goals were partially met. Patient is being discharged due to maximized rehab potential.    END OF SESSION:  PT End of Session - 03/08/24 1450     Visit Number 10    Number of Visits 13    Date for Recertification  04/02/24    Authorization Type Medicare    Progress Note Due on Visit 10    PT Start Time 1450   from OT session   PT Stop Time 1513    PT Time Calculation (min) 23 min    Equipment Utilized During Treatment Gait belt    Activity Tolerance Patient tolerated treatment well    Behavior During Therapy WFL for tasks assessed/performed               Past Medical History:  Diagnosis Date   Anxiety    BPH (benign prostatic hyperplasia)    Cancer (HCC) 05/31/2004   Brain tumor, in remission after chemo radiation and surgery   Diabetes mellitus without complication (HCC)    ED (erectile dysfunction)    Heart murmur    History of heart murmur   Hyperlipidemia    Hypertension    Sleep apnea    USES C-PAP   UTI (urinary tract infection)    Past Surgical History:  Procedure Laterality Date   BRAIN SURGERY  2006   BRAIN TUMOR  - CANCER   COLONOSCOPY     CYSTOSCOPY N/A 07/29/2015   Procedure: FLEXIBLE CYSTOSCOPY;  Surgeon: Randall Seltzer, MD;  Location: [facility];  Service: Urology;  Laterality: N/A;   FOOT BONE EXCISION  1989   PENILE PROSTHESIS IMPLANT N/A 07/29/2015   Procedure: PENILE PROSTHESIS;   Surgeon: Randall Seltzer, MD;  Location: [facility];  Service: Urology;  Laterality: N/A;   right knee meniscus repair  2010   THYROID  LOBECTOMY  05/12/2012   Procedure: THYROID  LOBECTOMY;  Surgeon: Randall CHRISTELLA Spinner, MD;  Location: [facility];  Service: General;  Laterality: Left;  left thyroid  lobectomy   Patient Active Problem List   Diagnosis Date Noted   Anemia 03/06/2024   Bladder outlet obstruction 03/06/2024   Nontoxic single thyroid  nodule 03/06/2024   Peripheral vascular disease 03/06/2024   Seizures (HCC) 01/16/2024   Depression 01/16/2024   Essential hypertension 01/16/2024   Diabetes mellitus type 2 in nonobese (HCC) 01/16/2024   Acute metabolic acidosis 01/16/2024   Obesity, Class I, BMI 30-34.9 01/16/2024   Hx of colonic polyps 10/18/2023   Abdominal pain 10/18/2023   Incomplete emptying of bladder 05/03/2023   Glioblastoma of temporal lobe (HCC) 04/18/2023   Benign prostatic hyperplasia with urinary retention 04/11/2023   Mixed hyperlipidemia 04/11/2023   Persistent hypersomnia 02/08/2022   Parasomnia associated with another health condition 03/20/2021   Non-restorative sleep 03/20/2021   Nightmares REM-sleep type 02/05/2021   Excessive daytime sleepiness 02/05/2021   OSA on CPAP 02/05/2021   Glioblastoma, IDH-wildtype (HCC) 02/05/2021   Disequilibrium 04/06/2019   History of malignant neoplasm of brain  03/23/2019   Neoplasm of brain (HCC) 03/23/2019   Lipoprotein deficiency disorder 11/21/2018   Encounter for screening for other disorder 11/11/2017   Pain of right shoulder region 07/05/2017   Adjustment disorder with depressed mood 11/01/2016   Secondary polycythemia 10/30/2015   Encounter for general adult medical examination without abnormal findings 10/23/2015   Erectile dysfunction 07/29/2015   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/27/2015   Paresthesia 09/25/2014   Neoplasm of uncertain behavior of thyroid  gland, left lobe 04/26/2012   Retinal  hemorrhage 12/29/2011   Encounter for other general examination 06/17/2010   Asymptomatic microscopic hematuria 06/23/2009   Right knee pain 03/24/2009   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    ONSET DATE: 01/16/2024 (referral date)  REFERRING DIAG: R53.1 (ICD-10-CM) - Weakness  THERAPY DIAG:  Muscle weakness (generalized)  Other symptoms and signs involving the musculoskeletal system  Other symptoms and signs involving the nervous system  Unsteadiness on feet  Other abnormalities of gait and mobility  Rationale for Evaluation and Treatment: Rehabilitation  SUBJECTIVE:                                                                                                                                                                                             SUBJECTIVE STATEMENT: Randall Lewis   Pt denies any acute changes since last visit. Pt reports that things are going well with his HEP, it remains a good challenge.  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  HPI per ED note 01/15/2024: Randall Lewis is a 68 y.o. 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 and also Randall Lewis was brought to the ER after patient was found to have left-sided weakness following which patient had seizure-like activity on the left side at around 9 PM yesterday at home.  Patient has been having headache for the last 24 hours and he tried some Tylenol  and also some leftover oxycodone  the headache eased up little bit.  Later in the  evening around 9 PM when he was walking to the bathroom when he had a fall but did not hit his head or lose consciousness.  Patient's wife states he was with him she helped him to the bathroom and while on the commode he  started developing left-sided weakness with seizure and EMS was called.  Patient's wife states that over the last 3 weeks he has been having some poor vision in the left eye.  PAIN:  Are you having pain? No  PRECAUTIONS: Fall and Other: seizures   PATIENT GOALS: 100% recovery  OBJECTIVE:  Note: Objective measures were completed at Evaluation unless otherwise noted.  DIAGNOSTIC FINDINGS:   Head CT 01/15/2024 IMPRESSION: 1. Allowing for modality difference, subdural hematoma of 8 mm thickness underlying the right parietal craniotomy site is unchanged compared to the MRI of 12/22/23. 2. Diffuse white matter hypoattenuation throughout the posterior right hemisphere at the area of tumor resection, with improved mass effect on the right lateral ventricle. No midline shift or hydrocephalus. 3. Chronic gliosis in the left frontal white matter, unchanged. 4. Findings communicated to Dr. [name] at 11:19 pm on 01/15/24.  COGNITION: Overall cognitive status: minor cognitive impairments per patient report                                                                                                                               TREATMENT    Physical Performance For LTG assessment:  [facility] PT Assessment - 03/08/24 1456       Ambulation/Gait   Gait velocity 32.8 over 10 sec = 3.28 ft/sec      Standardized Balance Assessment   Standardized Balance Assessment Five Times Sit to Stand    Five times sit to stand comments  19.59 sec   no UE     Functional Gait  Assessment   Gait assessed  Yes    Gait Level Surface Walks 20 ft in less than 7 sec but greater than 5.5 sec, uses assistive device, slower speed, mild gait deviations, or deviates 6-10 in outside of the 12  in walkway width.    Change in Gait Speed Able to smoothly change walking speed without loss of balance or gait deviation. Deviate no more than 6 in outside of the 12 in walkway width.    Gait with Horizontal Head Turns Performs head turns smoothly with slight change in gait velocity (eg, minor disruption to smooth gait path), deviates 6-10 in outside 12 in walkway width, or uses an assistive device.    Gait with Vertical Head Turns Performs task with slight change in gait velocity (eg, minor disruption to smooth gait path), deviates 6 - 10 in outside 12 in walkway width or uses assistive device    Gait and Pivot Turn Pivot turns safely within 3 sec and stops quickly with no loss of balance.    Step Over Obstacle Is able to step over 2 stacked shoe boxes taped together (9 in total height) without changing gait speed. No evidence of imbalance.    Gait with Narrow Base of Support Is able to ambulate for 10 steps heel to toe with no staggering.    Gait with Eyes Closed Walks 20 ft, slow speed,  abnormal gait pattern, evidence for imbalance, deviates 10-15 in outside 12 in walkway width. Requires more than 9 sec to ambulate 20 ft.    Ambulating Backwards Walks 20 ft, uses assistive device, slower speed, mild gait deviations, deviates 6-10 in outside 12 in walkway width.    Steps Alternating feet, must use rail.    Total Score 23    FGA comment: 23/30, medium fall risk            PATIENT EDUCATION: Education details: results of OM and functional implications, plan to d/c from OPPT Person educated: Patient Education method: Medical illustrator Education comprehension: verbalized understanding and returned demonstration  HOME EXERCISE PROGRAM: Access Code: [email or web address] URL: [email or web address] Date: 02/07/2024 Prepared by: Sheffield Senate  Exercises - Spanish Squat With Resistance  - 1 x daily - 7 x weekly - 3 sets - 10 reps - Romberg Stance Eyes Closed on Foam Pad  -  1-2 x daily - 4-5 x weekly - 3 sets - 30 hold - Romberg Stance on Foam Pad with Head Rotation  - 1-2 x daily - 4-5 x weekly - 2 sets - 10 reps and 10 reps head nods  - Walking with Head Rotation  - 1-2 x daily - 4-5 x weekly - 3 sets - Standing Marching  - 1-2 x daily - 4-5 x weekly - 3 sets - Wide Tandem Stance on Foam Pad with Eyes Closed  - 1-2 x daily - 4-5 x weekly - 2 sets - 10 reps   GOALS: Goals reviewed with patient? Yes  SHORT TERM GOALS: Target date: 02/13/2024   Pt will be independent with initial HEP for improved strength, balance, transfers and gait. Baseline: has not worked on yet (9/16) Goal status: IN PROGRESS  2.  Pt will improve 5 x STS to less than or equal to 17 seconds to demonstrate improved functional strength and transfer efficiency.  Baseline: 20.75 sec no UE (8/25), 20.25 sec no UE (9/16) Goal status: IN PROGRESS  3.  Pt will improve gait velocity to at least 2.75 ft/sec for improved gait efficiency and performance at SBA level  Baseline: 2.56 ft/sec no AD SBA (8/25), 3.2 ft/sec no AD SBA (9/16) Goal status: MET  4.  Pt will improve FGA to 19/30 for decreased fall risk  Baseline: 15/30 (8/25), 20/30 (9/16) Goal status: MET    LONG TERM GOALS: Target date: 03/08/2024 (updated to match last scheduled visit within POC)    Pt will be independent with final HEP for improved strength, balance, transfers and gait. Baseline:  Goal status: MET  2.  Pt will improve 5 x STS to less than or equal to 14 seconds to demonstrate improved functional strength and transfer efficiency.  Baseline: 20.75 sec no UE (8/25), 20.25 sec no UE (9/16), 19.59 sec no UE (10/9) Goal status: NOT MET  3.  Pt will improve gait velocity to at least 3.5 ft/sec for improved gait efficiency and performance at mod I level  Baseline:  2.56 ft/sec no AD SBA (8/25), 3.2 ft/sec no AD SBA (9/16), 3.28 ft/sec no AD mod I (10/9) Goal status: NOT MET  4.  Pt will improve FGA to 23/30 for  decreased fall risk  Baseline: 15/30 (8/25), 20/30 (9/16), 23/30 (10/9) Goal status: MET  5.  Pt will demonstrate good ability to compensate for his visual impairments in order to decrease his fall risk and be able to name 2 strategies for compensation. Baseline: visual impairments not assessed at eval  due to time constraints, pt independent with scanning environment for obstacles and turning his head to the L Goal status: MET    ASSESSMENT:  CLINICAL IMPRESSION: Emphasis of skilled PT session on assessing LTG in preparation for d/c from OPPT services this date. Pt has met 3/5 LTG due to being independent with his final HEP, improving his FGA score to 23/30, and being independent with visual impairment compensations. He did improve his 5xSTS score and gait speed but did not quite meet LTG set. Overall he shows great improvement as compared to his initial evaluation and is safe to d/c from OPPT services at this time.     OBJECTIVE IMPAIRMENTS: Abnormal gait, decreased balance, decreased cognition, decreased knowledge of condition, decreased knowledge of use of DME, difficulty walking, decreased strength, impaired perceived functional ability, and impaired vision/preception.   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   REHAB POTENTIAL: Good  CLINICAL DECISION MAKING: Unstable/unpredictable  EVALUATION COMPLEXITY: High  PLAN: discharge from PT   Waddell Southgate, PT, DPT, CSRS  03/08/24 3:13 PM

## 2024-03-08 NOTE — Therapy (Signed)
 OUTPATIENT OCCUPATIONAL THERAPY NEURO TREATMENT  Patient Name: Randall Lewis MRN: [id number] DOB:October 30, 1955, 68 y.o., male Today's Date: 03/08/2024  PCP: Yolande Toribio MATSU, MD  REFERRING PROVIDER: Vaslow, Zachary K, MD  END OF SESSION:  OT End of Session - 03/08/24 1429     Visit Number 4    Number of Visits 13    Date for Recertification  04/10/24    Authorization Type Medicare    OT Start Time 1401    OT Stop Time 1446    OT Time Calculation (min) 45 min    Equipment Utilized During Treatment Pipe assembly    Activity Tolerance Patient tolerated treatment well    Behavior During Therapy WFL for tasks assessed/performed           Past Medical History:  Diagnosis Date   Anxiety    BPH (benign prostatic hyperplasia)    Cancer (HCC) 05/31/2004   Brain tumor, in remission after chemo radiation and surgery   Diabetes mellitus without complication (HCC)    ED (erectile dysfunction)    Heart murmur    History of heart murmur   Hyperlipidemia    Hypertension    Sleep apnea    USES C-PAP   UTI (urinary tract infection)    Past Surgical History:  Procedure Laterality Date   BRAIN SURGERY  2006   BRAIN TUMOR  - CANCER   COLONOSCOPY     CYSTOSCOPY N/A 07/29/2015   Procedure: FLEXIBLE CYSTOSCOPY;  Surgeon: Norleen Seltzer, MD;  Location: [facility];  Service: Urology;  Laterality: N/A;   FOOT BONE EXCISION  1989   PENILE PROSTHESIS IMPLANT N/A 07/29/2015   Procedure: PENILE PROSTHESIS;  Surgeon: Norleen Seltzer, MD;  Location: [facility];  Service: Urology;  Laterality: N/A;   right knee meniscus repair  2010   THYROID  LOBECTOMY  05/12/2012   Procedure: THYROID  LOBECTOMY;  Surgeon: Krystal CHRISTELLA Spinner, MD;  Location: [facility];  Service: General;  Laterality: Left;  left thyroid  lobectomy   Patient Active Problem List   Diagnosis Date Noted   Anemia 03/06/2024   Bladder outlet obstruction 03/06/2024   Nontoxic single thyroid  nodule 03/06/2024   Peripheral vascular disease 03/06/2024    Seizures (HCC) 01/16/2024   Depression 01/16/2024   Essential hypertension 01/16/2024   Diabetes mellitus type 2 in nonobese (HCC) 01/16/2024   Acute metabolic acidosis 01/16/2024   Obesity, Class I, BMI 30-34.9 01/16/2024   Hx of colonic polyps 10/18/2023   Abdominal pain 10/18/2023   Incomplete emptying of bladder 05/03/2023   Glioblastoma of temporal lobe (HCC) 04/18/2023   Benign prostatic hyperplasia with urinary retention 04/11/2023   Mixed hyperlipidemia 04/11/2023   Persistent hypersomnia 02/08/2022   Parasomnia associated with another health condition 03/20/2021   Non-restorative sleep 03/20/2021   Nightmares REM-sleep type 02/05/2021   Excessive daytime sleepiness 02/05/2021   OSA on CPAP 02/05/2021   Glioblastoma, IDH-wildtype (HCC) 02/05/2021   Disequilibrium 04/06/2019   History of malignant neoplasm of brain 03/23/2019   Neoplasm of brain (HCC) 03/23/2019   Lipoprotein deficiency disorder 11/21/2018   Encounter for screening for other disorder 11/11/2017   Pain of right shoulder region 07/05/2017   Adjustment disorder with depressed mood 11/01/2016   Secondary polycythemia 10/30/2015   Encounter for general adult medical examination without abnormal findings 10/23/2015   Erectile dysfunction 07/29/2015   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/27/2015   Paresthesia 09/25/2014   Neoplasm of uncertain behavior of thyroid  gland, left lobe 04/26/2012   Retinal hemorrhage  12/29/2011   Encounter for other general examination 06/17/2010   Asymptomatic microscopic hematuria 06/23/2009   Right knee pain 03/24/2009   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 Buckley was brought to the ER after patient was found to have left-sided weakness following which patient had seizure-like activity on the left side at around 9 PM yesterday at home.  Patient has been having headache for the last 24 hours and he tried some Tylenol  and also some leftover oxycodone  the headache eased up little bit.  Later in the evening around 9 PM when he was walking to the bathroom when he had a fall but did not hit his head or lose consciousness.  Patient's wife states he was with him she helped him to the bathroom and while on the commode he started developing left-sided weakness with seizure and EMS was called.  Patient's wife states that over the last 3 weeks he has been having some poor vision in the left eye.  PRECAUTIONS: Fall and Other: seizures   WEIGHT BEARING RESTRICTIONS: No  PAIN:  Are you having pain? No  FALLS: Has patient fallen in last 6 months? Yes. Number of falls too many to count, had 3 on 8/17 when he had his seizure that led to ED visit   LIVING ENVIRONMENT: Lives with: lives with their spouse (but she works during the  day) Lives in: House/apartment Stairs: Yes: Internal: 12 steps; on right going up and External: 2-4 steps; on right going up Has following equipment at home: None  PLOF: Independent; not driving; retired from Doctor, hospital, Comptroller, and Lt. Colonel from [employer]; enjoys general repairs  PATIENT GOALS: return to driving; avoid bumping into items  OBJECTIVE:  Note: Objective measures were completed at Evaluation unless otherwise noted.  HAND DOMINANCE: Right  ADLs: Overall ADLs: mod I  IADLs:  Meal Prep: mod I though bumps into things in the kitchen Medication management: mod I  Typing: issues with knowing where the keys are and has issues identifying emails  MOBILITY STATUS: Independent and Hx of falls  ACTIVITY TOLERANCE: Activity tolerance: good to fair  FUNCTIONAL OUTCOME MEASURES: PSFS: 5.7 total score   Total score = sum of the activity scores/number of activities Minimum detectable change (90%CI) for average score = 2 points Minimum detectable change (90%CI) for single activity score = 3 points   UPPER EXTREMITY ROM and MMT:    BUE: WNL  HAND FUNCTION: Grip strength: Right: 98.1 lbs; Left:  65.6 lbs  COORDINATION: 9 Hole Peg test: Right: 34 sec; Left: 41 sec; with errors to L side  SENSATION: Paresthesias reported upon waking to UEs   EDEMA: none reported or observed  MUSCLE TONE: WFL  COGNITION: Overall cognitive status: Within functional limits for tasks assessed  VISION: Subjective report: Bumps into items on his L side Baseline vision: Wears glasses all the time Visual history: cataracts and brain injury  VISION ASSESSMENT: Impaired  Patient has difficulty with following activities due to following visual impairments: 30* of visual field on the left with inferior cut  PERCEPTION: Impaired: See above  PRAXIS: WFL  OBSERVATIONS: Pt ambulates without use of AD. No loss of balance. The pt appears well kept and has  glasses donned. At times demonstrated errors with manipulation of items in Left visual field.                                                                                                                           TREATMENT : - Self-care/home management completed for duration as noted below including:  OT educated patient on general vision recommendations including increased/improved lighting, increased contrast, and reduction of visual clutter as noted in patient instructions.  Furthermore, education was provided on compensatory strategies including turning head to the left to compensate for field cut as well as use of scanning to improve safety with functional mobility and accurate location of items to promote visual neuro rehabilitation following CVA.     OT also educated the patient on neck stretches to prevent stiffness on the left side of the neck, which may result from frequently turning the head to the left to compensate for a left visual field cut. The patient verbalized understanding and will be provided with specific neck exercises at the next visit. The therapist also recommended using visual markers, such as the end of the table, to help with spatial orientation, as the patient tends to tilt their head to the left to accurately scan the lower left visual quadrant. The patient was encouraged to be mindful of neck positioning, particularly on the right side, and to continue incorporating neck stretches to maintain mobility and prevent discomfort.  - Therapeutic activities completed for duration as noted below including:  Pt engaged in a PVC pipe tree puzzle activity while seated, targeting LUE coordination, bilateral integration, visual scanning, problem solving, and sequencing. Task involved assembling a 3-dimensional PVC pipe structure from a 2-dimensional image model.  Pt demonstrated good functional use of both upper extremities for picking up, sorting, and manipulating pieces. Therapist  encouraged pt to sort components prior to assembly to promote visual organization and awareness of available pieces. During task, min to mod cues were provided for sequencing and error correction.  Pt continues to show signs of a left visual field cut, as evidenced by incomplete assembly on the left side of the structure when replicating the model. The image to copy was positioned on the right side of the table,  with the PVC tree in midline and pieces arranged on the left. Pt was able to recognize that an error had occurred but demonstrated difficulty alternating visual attention to locate and correct errors without visual and verbal cueing from therapist.  When reaching for pieces on the left side, pt tended to reach toward the center of the table rather than toward the front or bottom of the row (closest to self), often identifying objects only when tactilely contacting them. Activity addressed awareness of the left visual field, midline orientation, and scanning strategy development.   PATIENT EDUCATION: Education details: Visual compensatory strategies, neck stretches Person educated: Patient Education method: Explanation, Demonstration, Tactile cues, and Verbal cues Education comprehension: verbalized understanding, returned demonstration, verbal cues required, tactile cues required, and needs further education  HOME EXERCISE PROGRAM: 03/01/2024: Landy putty exercises( access code;[email or web address]), golf solitaire 03/08/2024: Visual compensatory strategies, neck stretches  GOALS:  SHORT TERM GOALS: Target date: 03/27/2024   Patient will demonstrate initial L UE HEP with 25% verbal cues or less for proper execution. Baseline: Goal status: IN PROGRESS  2.  Patient will demonstrate at least 75 lbs L grip strength as needed to open jars and other containers. Baseline: Right: 98.1 lbs; Left: 65.6 lbs Goal status: IN PROGRESS  3.  Patient will independently recall at least 2 compensatory strategies  for visual impairment without cueing. Baseline:  Goal status: IN PROGRESS  LONG TERM GOALS: Target date: 04/10/2024   Patient will demonstrate updated L UE HEP with visual handouts only for proper execution. Baseline:  Goal status: IN PROGRESS  2.  Patient will report at least two-point increase in average PSFS score or at least three-point increase in a single activity score indicating functionally significant improvement given minimum detectable change.  Baseline: 5.7 total score (See above for individual activity scores) Goal status: IN PROGRESS  3.  Patient will demo improved FM coordination as evidenced by completing nine-hole peg with use of L in 36 seconds or less without errors in L visual field. Baseline: Right: 34 sec; Left: 41 sec Goal status: IN PROGRESS  ASSESSMENT:  CLINICAL IMPRESSION: Patient presents with L sided weakness and Visuospatial deficits secondary to glioblastoma. He demonstrates fair rehab potential as evidence to verbalizing understanding of HEP and visual compensatory strategies. Pt will continue to benefit from skilled outpatient OT to improve L sided weakness and to address vision deficits for functional independence in ADLs and IADLs.    PERFORMANCE DEFICITS: in functional skills including ADLs, IADLs, coordination, sensation, strength, Fine motor control, endurance, decreased knowledge of precautions, decreased knowledge of use of DME, vision, and UE functional use.   IMPAIRMENTS: are limiting patient from ADLs, IADLs, rest and sleep, and leisure.   CO-MORBIDITIES: has co-morbidities such as glioblastoma, DM, seizures that affects occupational performance. Patient will benefit from skilled OT to address above impairments and improve overall function.  OT OCCUPATIONAL PROFILE AND HISTORY: Detailed assessment: Review of records and additional review of physical, cognitive, psychosocial history related to current functional performance.   REHAB  POTENTIAL: Fair given medical history   PLAN:  OT FREQUENCY: 2x/week  OT DURATION: 6 weeks  PLANNED INTERVENTIONS: 97168 OT Re-evaluation, 97535 self care/ADL training, 02889 therapeutic exercise, 97530 therapeutic activity, 97112 neuromuscular re-education, 97018 paraffin, 02960 fluidotherapy, 97010 moist heat, 97750 Physical Performance Testing, functional mobility training, visual/perceptual remediation/compensation, coping strategies training, patient/family education, and DME and/or AE instructions  RECOMMENDED OTHER SERVICES: N/A for this visit  CONSULTED AND AGREED WITH PLAN OF CARE: Patient  PLAN FOR  NEXT SESSION:  Update HEP- more strengthening exercises  Visual scanning activities  Explore hobbies with repairs   Chasady Longwell, Student-OT 03/08/2024, 4:29 PM

## 2024-03-08 NOTE — Patient Instructions (Signed)
 SABRA

## 2024-03-13 ENCOUNTER — Ambulatory Visit: Admitting: Occupational Therapy

## 2024-03-13 DIAGNOSIS — R41842 Visuospatial deficit: Secondary | ICD-10-CM

## 2024-03-13 DIAGNOSIS — R278 Other lack of coordination: Secondary | ICD-10-CM

## 2024-03-13 DIAGNOSIS — M6281 Muscle weakness (generalized): Secondary | ICD-10-CM | POA: Diagnosis not present

## 2024-03-13 DIAGNOSIS — R29898 Other symptoms and signs involving the musculoskeletal system: Secondary | ICD-10-CM

## 2024-03-13 DIAGNOSIS — R29818 Other symptoms and signs involving the nervous system: Secondary | ICD-10-CM

## 2024-03-13 NOTE — Patient Instructions (Addendum)
 Access Code: [email or web address] URL: [email or web address] Date: 03/13/2024 Prepared by: [email or web address] Reddicks  Exercises - Seated Cervical Sidebending Stretch  - 2 x daily - 5 reps - Seated Cervical Retraction  - 2 x daily - 5 reps - Seated Cervical Rotation AROM  - 2 x daily - 5 reps - Seated Cervical Sidebending AROM  - 2 x daily - 5 reps - Seated Cervical Retraction and Extension  - 2 x daily - 5 reps

## 2024-03-13 NOTE — Therapy (Signed)
 OUTPATIENT OCCUPATIONAL THERAPY NEURO TREATMENT  Patient Name: Randall Lewis MRN: [id number] DOB:05/02/1956, 68 y.o., male Today's Date: 03/13/2024  PCP: Randall Toribio MATSU, MD  REFERRING PROVIDER: Vaslow, Zachary K, MD  END OF SESSION:  OT End of Session - 03/13/24 1445     Visit Number 5    Number of Visits 13    Date for Recertification  04/10/24    Authorization Type Medicare    OT Start Time 1445    OT Stop Time 1533    OT Time Calculation (min) 48 min    Activity Tolerance Patient tolerated treatment well    Behavior During Therapy [facility] for tasks assessed/performed           Past Medical History:  Diagnosis Date   Anxiety    BPH (benign prostatic hyperplasia)    Cancer (HCC) 05/31/2004   Brain tumor, in remission after chemo radiation and surgery   Diabetes mellitus without complication (HCC)    ED (erectile dysfunction)    Heart murmur    History of heart murmur   Hyperlipidemia    Hypertension    Sleep apnea    USES C-PAP   UTI (urinary tract infection)    Past Surgical History:  Procedure Laterality Date   BRAIN SURGERY  2006   BRAIN TUMOR  - CANCER   COLONOSCOPY     CYSTOSCOPY N/A 07/29/2015   Procedure: FLEXIBLE CYSTOSCOPY;  Surgeon: Randall Seltzer, MD;  Location: [facility];  Service: Urology;  Laterality: N/A;   FOOT BONE EXCISION  1989   PENILE PROSTHESIS IMPLANT N/A 07/29/2015   Procedure: PENILE PROSTHESIS;  Surgeon: Randall Seltzer, MD;  Location: [facility];  Service: Urology;  Laterality: N/A;   right knee meniscus repair  2010   THYROID  LOBECTOMY  05/12/2012   Procedure: THYROID  LOBECTOMY;  Surgeon: Randall CHRISTELLA Spinner, MD;  Location: [facility];  Service: General;  Laterality: Left;  left thyroid  lobectomy   Patient Active Problem List   Diagnosis Date Noted   Anemia 03/06/2024   Bladder outlet obstruction 03/06/2024   Nontoxic single thyroid  nodule 03/06/2024   Peripheral vascular disease 03/06/2024   Seizures (HCC) 01/16/2024   Depression 01/16/2024    Essential hypertension 01/16/2024   Diabetes mellitus type 2 in nonobese (HCC) 01/16/2024   Acute metabolic acidosis 01/16/2024   Obesity, Class I, BMI 30-34.9 01/16/2024   Hx of colonic polyps 10/18/2023   Abdominal pain 10/18/2023   Incomplete emptying of bladder 05/03/2023   Glioblastoma of temporal lobe (HCC) 04/18/2023   Benign prostatic hyperplasia with urinary retention 04/11/2023   Mixed hyperlipidemia 04/11/2023   Persistent hypersomnia 02/08/2022   Parasomnia associated with another health condition 03/20/2021   Non-restorative sleep 03/20/2021   Nightmares REM-sleep type 02/05/2021   Excessive daytime sleepiness 02/05/2021   OSA on CPAP 02/05/2021   Glioblastoma, IDH-wildtype (HCC) 02/05/2021   Disequilibrium 04/06/2019   History of malignant neoplasm of brain 03/23/2019   Neoplasm of brain (HCC) 03/23/2019   Lipoprotein deficiency disorder 11/21/2018   Encounter for screening for other disorder 11/11/2017   Pain of right shoulder region 07/05/2017   Adjustment disorder with depressed mood 11/01/2016   Secondary polycythemia 10/30/2015   Encounter for general adult medical examination without abnormal findings 10/23/2015   Erectile dysfunction 07/29/2015   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/27/2015   Paresthesia 09/25/2014   Neoplasm of uncertain behavior of thyroid  gland, left lobe 04/26/2012   Retinal hemorrhage 12/29/2011   Encounter for other general examination 06/17/2010  Asymptomatic microscopic hematuria 06/23/2009   Right knee pain 03/24/2009   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    ONSET DATE: 02/02/2024 (Date of referral)  REFERRING DIAG: C71.9 (ICD-10-CM) - Glioblastoma, IDH-wildtype   THERAPY DIAG:  Muscle weakness (generalized)  Visuospatial deficit  Other lack of coordination  Other symptoms and signs involving the musculoskeletal system  Other symptoms and signs involving the nervous system  Rationale for Evaluation and  Treatment: Rehabilitation  SUBJECTIVE:   SUBJECTIVE STATEMENT: I am ready for any challenge that you throw at me today.   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  Patient has been having headache for the last 24 hours and he tried some Tylenol  and also some leftover oxycodone  the headache eased up little bit.  Later in the evening around 9 PM when he was walking to the bathroom when he had a fall but did not hit his head or lose consciousness.  Patient's wife states he was with him she helped him to the bathroom and while on the commode he started developing left-sided weakness with seizure and EMS was called.  Patient's wife states that over the last 3 weeks he has been having some poor vision in the left eye.  PRECAUTIONS: Fall and Other: seizures   WEIGHT BEARING RESTRICTIONS: No  PAIN:  Are you having pain? No  FALLS: Has patient fallen in last 6 months? Yes. Number of falls too many to count, had 3 on 8/17 when he had his seizure that led to ED visit   LIVING ENVIRONMENT: Lives with: lives with their spouse (but she works during the day) Lives in: House/apartment Stairs: Yes: Internal: 12 steps; on right going up and  External: 2-4 steps; on right going up Has following equipment at home: None  PLOF: Independent; not driving; retired from Doctor, hospital, Comptroller, and Lt. Colonel from [employer]; enjoys general repairs  PATIENT GOALS: return to driving; avoid bumping into items  OBJECTIVE:  Note: Objective measures were completed at Evaluation unless otherwise noted.  HAND DOMINANCE: Right  ADLs: Overall ADLs: mod I  IADLs:  Meal Prep: mod I though bumps into things in the kitchen Medication management: mod I  Typing: issues with knowing where the keys are and has issues identifying emails  MOBILITY STATUS: Independent and Hx of falls  ACTIVITY TOLERANCE: Activity tolerance: good to fair  FUNCTIONAL OUTCOME MEASURES: PSFS: 5.7 total score   Total score = sum of the activity scores/number of activities Minimum detectable change (90%CI) for average score = 2 points Minimum detectable change (90%CI) for single activity score = 3 points   UPPER EXTREMITY ROM and MMT:    BUE: WNL  HAND FUNCTION: Grip strength: Right: 98.1 lbs; Left: 65.6 lbs  COORDINATION: 9 Hole Peg test: Right: 34 sec; Left: 41 sec;  with errors to L side  SENSATION: Paresthesias reported upon waking to UEs   EDEMA: none reported or observed  MUSCLE TONE: WFL  COGNITION: Overall cognitive status: Within functional limits for tasks assessed  VISION: Subjective report: Bumps into items on his L side Baseline vision: Wears glasses all the time Visual history: cataracts and brain injury  VISION ASSESSMENT: Impaired  Patient has difficulty with following activities due to following visual impairments: 30* of visual field on the left with inferior cut  PERCEPTION: Impaired: See above  PRAXIS: WFL  OBSERVATIONS: Pt ambulates without use of AD. No loss of balance. The pt appears well kept and has glasses donned. At times demonstrated errors with manipulation of items in Left visual field.                                                                                                                            TREATMENT : - Therapeutic activities completed for duration as noted below including:  Pt engaged in a BUE coordination task that promoted visual scanning. Pt was asked to place and remove small, colored pegs into corresponding holes using pattern sheets placed on his left side to promote visual scanning. Initially, pt missed components of the design located in the lower left visual field, requiring moderate cueing to implement compensatory strategies for improved accuracy and task completion. Colored paper was placed at the bottom edge of the work surface to promote full visual scanning. Pt was instructed to scan until identifying the red or yellow paper, signaling the visual boundary for task completion. With these strategies in place, pt demonstrated increased accuracy for completion of task.   Additionally, a number chart was used to assess visual scanning accuracy. Pt initially skipped two lines on the left side of the sheet, indicating difficulty with left field scanning. Once visual anchors were introduced, pt was able to re-scan the number chart accurately without additional cueing.  Throughout the session the following compensatory strategies were used; Visual anchors (colored sheets) to support scanning consistency and spatial awareness. Other strategies included backing up, slight head turning to bring items into the right visual field, scooting back to widen the visual field, and using tactile feedback to assist with locating and manipulating items accurately.  - Therapeutic exercises completed for duration as noted below including:  OT provided the patient with neck stretches to prevent stiffness on the left side of the neck, which may result from frequently turning the head to the left to compensate for a left visual field cut. The exercises included the following;  -  Seated Cervical Sidebending Stretch  - 2 x daily - 5 reps - Seated Cervical Retraction  - 2 x daily - 5 reps - Seated Cervical Rotation AROM  - 2 x daily - 5 reps - Seated Cervical Sidebending AROM  - 2 x daily - 5 reps - Seated Cervical Retraction and Extension  - 2 x daily - 5 reps  PATIENT EDUCATION: Education details: Visual compensatory strategies, neck stretches Person educated: Patient Education method: Explanation, Demonstration, Tactile cues, Verbal cues, and Handouts Education comprehension: verbalized understanding, returned demonstration, verbal cues required, tactile cues required, and needs further education  HOME EXERCISE PROGRAM: 03/01/2024: Landy putty exercises( access code;[email or web address]), golf solitaire 03/08/2024: Visual compensatory strategies, neck stretches 03/13/2024: Neck stretches HEP  GOALS:  SHORT TERM GOALS: Target date: 03/27/2024   Patient will demonstrate initial L UE HEP with 25% verbal cues or less for proper execution. Baseline: Goal status: IN PROGRESS  2.  Patient will demonstrate at least 75 lbs L grip strength as needed to open jars and other containers. Baseline: Right: 98.1 lbs; Left: 65.6 lbs Goal status: IN PROGRESS  3.  Patient will independently recall at least 2 compensatory strategies for visual impairment without cueing. Baseline:  Goal status: IN PROGRESS  LONG TERM GOALS: Target date: 04/10/2024   Patient will demonstrate updated L UE HEP with visual handouts only for proper execution. Baseline:  Goal status: IN PROGRESS  2.  Patient will report at least two-point increase in average PSFS score or at least three-point increase in a single activity score indicating functionally significant improvement given minimum detectable change.  Baseline: 5.7 total score (See above for individual activity scores) Goal status: IN PROGRESS  3.  Patient will demo improved FM coordination as evidenced by completing nine-hole peg with use of L  in 36 seconds or less without errors in L visual field. Baseline: Right: 34 sec; Left: 41 sec Goal status: IN PROGRESS  ASSESSMENT:  CLINICAL IMPRESSION: Patient presents with L sided weakness and Visuospatial deficits secondary to glioblastoma. He demonstrates fair rehab potential as evidence to verbalizing understanding of HEP and visual compensatory strategies. Pt will continue to benefit from skilled outpatient OT to improve L sided weakness and to address vision deficits for functional independence in ADLs and IADLs.    PERFORMANCE DEFICITS: in functional skills including ADLs, IADLs, coordination, sensation, strength, Fine motor control, endurance, decreased knowledge of precautions, decreased knowledge of use of DME, vision, and UE functional use.   IMPAIRMENTS: are limiting patient from ADLs, IADLs, rest and sleep, and leisure.   CO-MORBIDITIES: has co-morbidities such as glioblastoma, DM, seizures that affects occupational performance. Patient will benefit from skilled OT to address above impairments and improve overall function.  OT OCCUPATIONAL PROFILE AND HISTORY: Detailed assessment: Review of records and additional review of physical, cognitive, psychosocial history related to current functional performance.   REHAB POTENTIAL: Fair given medical history   PLAN:  OT FREQUENCY: 2x/week  OT DURATION: 6 weeks  PLANNED INTERVENTIONS: 97168 OT Re-evaluation, 97535 self care/ADL training, 02889 therapeutic exercise, 97530 therapeutic activity, 97112 neuromuscular re-education, 97018 paraffin, 02960 fluidotherapy, 97010 moist heat, 97750 Physical Performance Testing, functional mobility training, visual/perceptual remediation/compensation, coping strategies training, patient/family education, and DME and/or AE instructions  RECOMMENDED OTHER SERVICES: N/A for this visit  CONSULTED AND AGREED WITH PLAN OF CARE: Patient  PLAN FOR NEXT SESSION:  Update HEP- more strengthening  exercises for LUE Visual scanning activities using visual anchors- word search/ larger peg design/ connect 4 Explore hobbies with repairs   Ozias Dicenzo, Student-OT 03/13/2024, 4:09 PM

## 2024-03-14 ENCOUNTER — Ambulatory Visit (INDEPENDENT_AMBULATORY_CARE_PROVIDER_SITE_OTHER): Admitting: Neurology

## 2024-03-14 DIAGNOSIS — C711 Malignant neoplasm of frontal lobe: Secondary | ICD-10-CM

## 2024-03-14 DIAGNOSIS — R569 Unspecified convulsions: Secondary | ICD-10-CM

## 2024-03-14 DIAGNOSIS — C719 Malignant neoplasm of brain, unspecified: Secondary | ICD-10-CM

## 2024-03-14 DIAGNOSIS — D440 Neoplasm of uncertain behavior of thyroid gland: Secondary | ICD-10-CM

## 2024-03-14 DIAGNOSIS — G4733 Obstructive sleep apnea (adult) (pediatric): Secondary | ICD-10-CM

## 2024-03-15 ENCOUNTER — Other Ambulatory Visit: Payer: Self-pay

## 2024-03-15 ENCOUNTER — Ambulatory Visit: Admitting: Occupational Therapy

## 2024-03-15 ENCOUNTER — Inpatient Hospital Stay: Admitting: Internal Medicine

## 2024-03-15 ENCOUNTER — Inpatient Hospital Stay

## 2024-03-15 ENCOUNTER — Encounter: Payer: Self-pay | Admitting: Internal Medicine

## 2024-03-15 VITALS — BP 117/78 | HR 50 | Temp 97.6°F | Resp 18

## 2024-03-15 VITALS — BP 133/82 | HR 62 | Temp 97.5°F | Resp 20 | Wt 215.9 lb

## 2024-03-15 DIAGNOSIS — R569 Unspecified convulsions: Secondary | ICD-10-CM

## 2024-03-15 DIAGNOSIS — C719 Malignant neoplasm of brain, unspecified: Secondary | ICD-10-CM

## 2024-03-15 DIAGNOSIS — Z5112 Encounter for antineoplastic immunotherapy: Secondary | ICD-10-CM | POA: Diagnosis not present

## 2024-03-15 LAB — CMP (CANCER CENTER ONLY)
ALT: 17 U/L (ref 0–44)
AST: 18 U/L (ref 15–41)
Albumin: 4.2 g/dL (ref 3.5–5.0)
Alkaline Phosphatase: 60 U/L (ref 38–126)
Anion gap: 6 (ref 5–15)
BUN: 17 mg/dL (ref 8–23)
CO2: 28 mmol/L (ref 22–32)
Calcium: 9.8 mg/dL (ref 8.9–10.3)
Chloride: 103 mmol/L (ref 98–111)
Creatinine: 1.11 mg/dL (ref 0.61–1.24)
GFR, Estimated: >60 mL/min (ref >60)
Glucose, Bld: 120 mg/dL — ABNORMAL HIGH (ref 70–99)
Potassium: 3.8 mmol/L (ref 3.5–5.1)
Sodium: 137 mmol/L (ref 135–145)
Total Bilirubin: 0.8 mg/dL (ref 0.0–1.2)
Total Protein: 7.1 g/dL (ref 6.5–8.1)

## 2024-03-15 LAB — CBC WITH DIFFERENTIAL (CANCER CENTER ONLY)
Abs Immature Granulocytes: 0.01 K/uL (ref 0.00–0.07)
Basophils Absolute: 0 K/uL (ref 0.0–0.1)
Basophils Relative: 1 %
Eosinophils Absolute: 0 K/uL (ref 0.0–0.5)
Eosinophils Relative: 0 %
HCT: 51 % (ref 39.0–52.0)
Hemoglobin: 17.3 g/dL — ABNORMAL HIGH (ref 13.0–17.0)
Immature Granulocytes: 0 %
Lymphocytes Relative: 25 %
Lymphs Abs: 0.9 K/uL (ref 0.7–4.0)
MCH: 26.6 pg (ref 26.0–34.0)
MCHC: 33.9 g/dL (ref 30.0–36.0)
MCV: 78.3 fL — ABNORMAL LOW (ref 80.0–100.0)
Monocytes Absolute: 0.4 K/uL (ref 0.1–1.0)
Monocytes Relative: 10 %
Neutro Abs: 2.2 K/uL (ref 1.7–7.7)
Neutrophils Relative %: 64 %
Platelet Count: 132 K/uL — ABNORMAL LOW (ref 150–400)
RBC: 6.51 MIL/uL — ABNORMAL HIGH (ref 4.22–5.81)
RDW: 17.4 % — ABNORMAL HIGH (ref 11.5–15.5)
WBC Count: 3.5 K/uL — ABNORMAL LOW (ref 4.0–10.5)
nRBC: 0 % (ref 0.0–0.2)

## 2024-03-15 LAB — TOTAL PROTEIN, URINE DIPSTICK: Protein, ur: NEGATIVE mg/dL

## 2024-03-15 MED ORDER — SODIUM CHLORIDE 0.9 % IV SOLN: INTRAVENOUS | Status: DC

## 2024-03-15 MED ORDER — SODIUM CHLORIDE 0.9 % IV SOLN
1000.0000 mg | Freq: Once | INTRAVENOUS | Status: AC
  Administered 2024-03-15: 1000 mg via INTRAVENOUS
  Filled 2024-03-15: qty 32

## 2024-03-15 NOTE — Progress Notes (Signed)
 [facility] [address]  [place], [place] [address] [phone]   Interval Evaluation  Date of Service: 03/15/24 Patient Name: Randall Lewis Patient MRN: [id number] Patient DOB: 1955/11/23 Provider: Arthea MARLA Manns, MD  Identifying Statement:  Randall Lewis is a 68 y.o. male with right frontal glioblastoma    Oncologic History: Anaplastic oligodendroglioma of frontal lobe (CMS/HHS-HCC)  04/16/2005 Surgery  Craniotomy for resection of left frontal lesion. 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 Lost to follow up after 03/18/2010  05/02/2023 Presentation  Presentation to the [facility]. Recommendations pending final pathology. Likely XRT/Temozolomide  as this is a high grade glioma. 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 Pathology reveals Infiltrating High Grade Glioma pending final molecular studies for integrated diagnosis.  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 (chromosomal microarray) Chromosome 10 loss with PTEN loss is detected (chromosomal microarray) 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  No further seizures since prior visit, he continues on Keppra  1000mg  twice per day.  No neurologic changes today.  He still reports recognizing faces in people that he doesn't know.  Otherwise his walking is independent.  No headaches or seizures.    H+P (05/19/23) Patient presents for follow up after craniotomy, resection of recurrent primary brain tumor at [facility] on 04/18/23.  He tolerated surgery well, no ill effects.  He is functionally intact, no further headaches, no seizures.  Recently increased blood pressure medication with his PCP.  Has visit with radiation oncology upcoming as well.  Medications: Current Outpatient Medications on File Prior to Visit  Medication Sig Dispense Refill   acetaminophen  (TYLENOL ) 325 MG tablet Take 975 mg by mouth every 6 (six) hours as needed.     amLODipine  (NORVASC ) 5 MG tablet Take 5 mg by mouth daily.     JARDIANCE 25 MG TABS tablet Take 25 mg by mouth daily.     levETIRAcetam  (KEPPRA ) 1000 MG tablet Take 1 tablet (1,000 mg total) by mouth 2 (two) times daily. 180 tablet 3   lomustine  (GLEOSTINE ) 100 MG capsule Take 2 capsules (200 mg total) by mouth once for  1 dose. Take on an emtpy stomach 1 hour before or 2 hours after  meals. Caution:chemotherapy 2 capsule 0   lomustine  (GLEOSTINE ) 40 MG capsule Take 5 capsules (200 mg total) by mouth once for 1 dose. Take on an empty stomach 1 hour before or 2 hours after meals. (Patient not taking: Reported on 03/06/2024) 5 capsule 0   losartan -hydrochlorothiazide  (HYZAAR) 100-12.5 MG tablet Take 1 tablet by mouth daily.     metFORMIN (GLUCOPHAGE-XR) 500 MG 24 hr tablet Take 1,000 mg by mouth daily.     metoprolol  tartrate (LOPRESSOR ) 12.5 mg TABS tablet Take 12.5 mg by mouth daily.     oxyCODONE  (OXY IR/ROXICODONE ) 5 MG immediate release tablet Take 5 mg by mouth every 4 (four) hours as needed.     pioglitazone (ACTOS) 15 MG tablet Take 15 mg by mouth daily.     predniSONE  (DELTASONE ) 50 MG tablet Take 1 tablet at 13 hours, 7 hours, and 1 hour prior to IV contrast 9 tablet 0   simvastatin  (ZOCOR ) 20 MG tablet Take 20 mg by mouth daily.     No current facility-administered medications on file prior to visit.    Allergies:  Allergies  Allergen Reactions   Sildenafil Other (See Comments) and Nausea Only   Gadolinium Derivatives Nausea And Vomiting    Per pt , always get sick with gado and never offered anti nausea meds.   Past Medical History:  Past Medical History:  Diagnosis Date   Anxiety    BPH (benign prostatic hyperplasia)    Cancer (HCC) 05/31/2004   Brain tumor, in remission after chemo radiation and surgery   Diabetes mellitus without complication (HCC)    ED (erectile dysfunction)    Heart murmur    History of heart murmur   Hyperlipidemia    Hypertension    Sleep apnea    USES C-PAP   UTI (urinary tract infection)    Past Surgical History:  Past Surgical History:  Procedure Laterality Date   BRAIN SURGERY  2006   BRAIN TUMOR  - CANCER   COLONOSCOPY     CYSTOSCOPY N/A 07/29/2015   Procedure: FLEXIBLE CYSTOSCOPY;  Surgeon: Norleen Seltzer, MD;  Location: [facility];  Service: Urology;  Laterality: N/A;   FOOT BONE EXCISION  1989   PENILE PROSTHESIS  IMPLANT N/A 07/29/2015   Procedure: PENILE PROSTHESIS;  Surgeon: Norleen Seltzer, MD;  Location: [facility];  Service: Urology;  Laterality: N/A;   right knee meniscus repair  2010   THYROID  LOBECTOMY  05/12/2012   Procedure: THYROID  LOBECTOMY;  Surgeon: Krystal CHRISTELLA Spinner, MD;  Location: [facility];  Service: General;  Laterality: Left;  left thyroid  lobectomy   Social History:  Social History   Socioeconomic History   Marital status: Married    Spouse name: [name]   Number of children: 2   Years of education: Not on file   Highest education level: Master's degree (e.g., MA, MS, MEng, MEd, MSW, MBA)  Occupational History   Not on file  Tobacco Use   Smoking status: Former    Current packs/day: 0.00    Average packs/day: 1 pack/day for 23.0 years (23.0 ttl pk-yrs)    Types: Cigarettes    Start date: 04/26/1973    Quit date: 04/26/1996    Years since quitting: 27.9   Smokeless tobacco: Never  Vaping Use   Vaping status: Never Used  Substance and Sexual Activity   Alcohol use: No   Drug use: No   Sexual  activity: Not on file  Other Topics Concern   Not on file  Social History Narrative   Lives with wife   Right handed   Caffeine: 2 cups of coffee and 1 glass of tea/soda a day   Social Drivers of Health   Financial Resource Strain: Low Risk  (05/01/2023)   Received from [facility]   Overall Financial Resource Strain (CARDIA)    Difficulty of Paying Living Expenses: Not hard at all  Food Insecurity: Low Risk  (02/20/2024)   Received from [facility]   Hunger Vital Sign    Within the past 12 months, you worried that your food would run out before you got money to buy more: Never true    Within the past 12 months, the food you bought just didn't last and you didn't have money to get more. : Never true  Transportation Needs: No Transportation Needs (02/20/2024)   Received from [facility]    In the past 12 months, has lack of reliable transportation  kept you from medical appointments, meetings, work or from getting things needed for daily living? : No  Physical Activity: Not on file  Stress: Not on file  Social Connections: Unknown (01/16/2024)   Social Connection and Isolation Panel    Frequency of Communication with Friends and Family: More than three times a week    Frequency of Social Gatherings with Friends and Family: More than three times a week    Attends Religious Services: Not on file    Active Member of Clubs or Organizations: Yes    Attends Banker Meetings: More than 4 times per year    Marital Status: Married  Catering manager Violence: Not At Risk (01/16/2024)   Humiliation, Afraid, Rape, and Kick questionnaire    Fear of Current or Ex-Partner: No    Emotionally Abused: No    Physically Abused: No    Sexually Abused: No   Family History:  Family History  Problem Relation Age of Onset   Breast cancer Mother    Cancer Brother 63   Heart attack Brother    Heart attack Brother    Birth defects Son    Colon cancer Neg Hx    Liver disease Neg Hx    Esophageal cancer Neg Hx     Review of Systems: Constitutional: Doesn't report fevers, chills or abnormal weight loss Eyes: Doesn't report blurriness of vision Ears, nose, mouth, throat, and face: Doesn't report sore throat Respiratory: Doesn't report cough, dyspnea or wheezes Cardiovascular: Doesn't report palpitation, chest discomfort  Gastrointestinal:  Doesn't report nausea, constipation, diarrhea GU: Doesn't report incontinence Skin: Doesn't report skin rashes Neurological: Per HPI Musculoskeletal: Doesn't report joint pain Behavioral/Psych: Doesn't report anxiety  Physical Exam: There were no vitals filed for this visit.  KPS: 70 General: Alert, cooperative, pleasant, in no acute distress Head: Normal EENT: No conjunctival injection or scleral icterus.  Lungs: Resp effort normal Cardiac: Regular rate Abdomen: Non-distended abdomen Skin:  No rashes cyanosis or petechiae. Extremities: No clubbing or edema  Neurologic Exam: Mental Status: Awake, alert, attentive to examiner. Oriented to self and environment. Language is fluent with intact comprehension.  Cranial Nerves: Visual acuity is grossly normal. Left hemianopia. Extra-ocular movements intact. No ptosis. Face is symmetric Motor: Tone and bulk are normal. Power is full in both arms and legs. Reflexes are symmetric, no pathologic reflexes present.  Sensory: Intact to light touch Gait: Normal.   Labs: I have reviewed  the data as listed    Component Value Date/Time   NA 137 03/01/2024 0912   K 3.9 03/01/2024 0912   CL 103 03/01/2024 0912   CO2 26 03/01/2024 0912   GLUCOSE 178 (H) 03/01/2024 0912   BUN 25 (H) 03/01/2024 0912   CREATININE 1.36 (H) 03/01/2024 0912   CREATININE 1.31 (H) 02/25/2012 1438   CALCIUM 9.8 03/01/2024 0912   PROT 7.5 03/01/2024 0912   ALBUMIN 4.3 03/01/2024 0912   AST 14 (L) 03/01/2024 0912   ALT 13 03/01/2024 0912   ALKPHOS 51 03/01/2024 0912   BILITOT 1.0 03/01/2024 0912   GFRNONAA 57 (L) 03/01/2024 0912   GFRNONAA >60 02/25/2012 1438   GFRAA >60 08/14/2019 1438   GFRAA >60 02/25/2012 1438   Lab Results  Component Value Date   WBC 3.2 (L) 03/01/2024   NEUTROABS 1.8 03/01/2024   HGB 18.2 (H) 03/01/2024   HCT 53.6 (H) 03/01/2024   MCV 78.5 (L) 03/01/2024   PLT 166 03/01/2024     Assessment/Plan Glioblastoma, IDH-wildtype (HCC)  Seizures (HCC)  Lamar LITTIE Gosling is clinically stable today, now on cycle #2, day 29 CCNU/avastin .  Labs are within normal limits today.  Patient elected to continue with cycle #2 oral CCNU 90mg /m2 q6 weeks and avastin  10mg /kg IV 2q weeks.  Avastin  will be helpful as concurrent therapy given burden of enhancing tumor and steroid requirement.  We reviewed side effects of CCNU, including fatigue, nausea vomiting, cytopenias, ILD.  We reviewed side effects of avastin , including hypertension,  bleeding/clotting events, wound healing impairment.  The patient will have a complete blood count, a comprehensive metabolic panel, and urine protein performed prior to each avastin  infusion. Labs may need to be performed more often. Zofran  will prescribed for home use for nausea/vomiting.    Informed consent was obtained verbally at bedside to proceed with oral chemotherapy and avastin .  Chemotherapy should be held for the following:  ANC less than 1,000  Platelets less than 100,000  LFT or creatinine greater than 2x ULN  If clinical concerns/contraindications develop   Avastin  should be held for the following:  ANC less than 500  Platelets less than 50,000  LFT or creatinine greater than 2x ULN  If clinical concerns/contraindications develop  He understands he is not safe to drive at this time.  We did recommend following through with PT/OT as arranged by the inpatient team.    Keppra  will con't 1000mg  BID.  We ask that Lamar LITTIE Gosling return to clinic in 2 weeks with MRI brain prior to cycle #3 CCNU/avastin , or sooner as needed.  MRI scheduled for 03/26/24.  All questions were answered. The patient knows to call the clinic with any problems, questions or concerns. No barriers to learning were detected.  The total time spent in the encounter was 30 minutes and more than 50% was on counseling and review of test results   Arthea MARLA Manns, MD Medical Director of Neuro-Oncology [facility] 03/15/24 10:34 AM

## 2024-03-15 NOTE — Progress Notes (Addendum)
 Vegzelma  diluted in NS :  Lot: [id number], Exp: 06/30/2025.  Bridgett Leach [place], [place], BCPS, BCOP 03/15/2024  12:35 PM

## 2024-03-15 NOTE — Progress Notes (Addendum)
 Okay to reduce bevacizumab  dose to 1000 mg for today's dose and future cycles per Dr. Buckley due to weight loss.  [name] Nasuti, PharmD Oncology Infusion Pharmacist 03/15/2024 11:30 AM

## 2024-03-15 NOTE — Patient Instructions (Signed)
 [facility] - A DEPT OF [facility]  Discharge Instructions: Thank you for choosing [facility] to provide your oncology and hematology care.   If you have a lab appointment with the Cancer Center, please go directly to the Cancer Center and check in at the registration area.   Wear comfortable clothing and clothing appropriate for easy access to any Portacath or PICC line.   We strive to give you quality time with your provider. You may need to reschedule your appointment if you arrive late (15 or more minutes).  Arriving late affects you and other patients whose appointments are after yours.  Also, if you miss three or more appointments without notifying the office, you may be dismissed from the clinic at the provider's discretion.      For prescription refill requests, have your pharmacy contact our office and allow 72 hours for refills to be completed.    Today you received the following chemotherapy and/or immunotherapy agents Bevacizumab       To help prevent nausea and vomiting after your treatment, we encourage you to take your nausea medication as directed.  BELOW ARE SYMPTOMS THAT SHOULD BE REPORTED IMMEDIATELY: *FEVER GREATER THAN 100.4 F (38 C) OR HIGHER *CHILLS OR SWEATING *NAUSEA AND VOMITING THAT IS NOT CONTROLLED WITH YOUR NAUSEA MEDICATION *UNUSUAL SHORTNESS OF BREATH *UNUSUAL BRUISING OR BLEEDING *URINARY PROBLEMS (pain or burning when urinating, or frequent urination) *BOWEL PROBLEMS (unusual diarrhea, constipation, pain near the anus) TENDERNESS IN MOUTH AND THROAT WITH OR WITHOUT PRESENCE OF ULCERS (sore throat, sores in mouth, or a toothache) UNUSUAL RASH, SWELLING OR PAIN  UNUSUAL VAGINAL DISCHARGE OR ITCHING   Items with * indicate a potential emergency and should be followed up as soon as possible or go to the Emergency Department if any problems should occur.  Please show the CHEMOTHERAPY ALERT CARD or IMMUNOTHERAPY  ALERT CARD at check-in to the Emergency Department and triage nurse.  Should you have questions after your visit or need to cancel or reschedule your appointment, please contact [facility] - A DEPT OF [facility]  Dept: [phone]  and follow the prompts.  Office hours are 8:00 a.m. to 4:30 p.m. Monday - Friday. Please note that voicemails left after 4:00 p.m. may not be returned until the following business day.  We are closed weekends and major holidays. You have access to a nurse at all times for urgent questions. Please call the main number to the clinic Dept: [phone] and follow the prompts.   For any non-urgent questions, you may also contact your provider using MyChart. We now offer e-Visits for anyone 64 and older to request care online for non-urgent symptoms. For details visit [email or web address].   Also download the MyChart app! Go to the app store, search MyChart, open the app, select [facility], and log in with your MyChart username and password.

## 2024-03-20 ENCOUNTER — Ambulatory Visit: Admitting: Occupational Therapy

## 2024-03-20 DIAGNOSIS — R29818 Other symptoms and signs involving the nervous system: Secondary | ICD-10-CM

## 2024-03-20 DIAGNOSIS — R41842 Visuospatial deficit: Secondary | ICD-10-CM

## 2024-03-20 DIAGNOSIS — M6281 Muscle weakness (generalized): Secondary | ICD-10-CM

## 2024-03-20 DIAGNOSIS — R278 Other lack of coordination: Secondary | ICD-10-CM

## 2024-03-20 DIAGNOSIS — R29898 Other symptoms and signs involving the musculoskeletal system: Secondary | ICD-10-CM

## 2024-03-20 NOTE — Therapy (Addendum)
 OUTPATIENT OCCUPATIONAL THERAPY NEURO TREATMENT  Patient Name: Randall Lewis MRN: [id number] DOB:11-27-1955, 68 y.o., male Today's Date: 03/20/2024  PCP: Randall Toribio MATSU, MD  REFERRING PROVIDER: Buckley Arthea POUR, MD  END OF SESSION:  OT End of Session - 03/20/24 1446     Visit Number 6    Number of Visits 13    Date for Recertification  04/10/24    Authorization Type Medicare    OT Start Time 1446    OT Stop Time 1532    OT Time Calculation (min) 46 min    Activity Tolerance Patient tolerated treatment well    Behavior During Therapy [facility] for tasks assessed/performed           Past Medical History:  Diagnosis Date   Anxiety    BPH (benign prostatic hyperplasia)    Cancer (HCC) 05/31/2004   Brain tumor, in remission after chemo radiation and surgery   Diabetes mellitus without complication (HCC)    ED (erectile dysfunction)    Heart murmur    History of heart murmur   Hyperlipidemia    Hypertension    Sleep apnea    USES C-PAP   UTI (urinary tract infection)    Past Surgical History:  Procedure Laterality Date   BRAIN SURGERY  2006   BRAIN TUMOR  - CANCER   COLONOSCOPY     CYSTOSCOPY N/A 07/29/2015   Procedure: FLEXIBLE CYSTOSCOPY;  Surgeon: Randall Seltzer, MD;  Location: [facility];  Service: Urology;  Laterality: N/A;   FOOT BONE EXCISION  1989   PENILE PROSTHESIS IMPLANT N/A 07/29/2015   Procedure: PENILE PROSTHESIS;  Surgeon: Randall Seltzer, MD;  Location: [facility];  Service: Urology;  Laterality: N/A;   right knee meniscus repair  2010   THYROID  LOBECTOMY  05/12/2012   Procedure: THYROID  LOBECTOMY;  Surgeon: Randall CHRISTELLA Spinner, MD;  Location: [facility];  Service: General;  Laterality: Left;  left thyroid  lobectomy   Patient Active Problem List   Diagnosis Date Noted   Anemia 03/06/2024   Bladder outlet obstruction 03/06/2024   Nontoxic single thyroid  nodule 03/06/2024   Peripheral vascular disease 03/06/2024   Seizures (HCC) 01/16/2024   Depression 01/16/2024    Essential hypertension 01/16/2024   Diabetes mellitus type 2 in nonobese (HCC) 01/16/2024   Acute metabolic acidosis 01/16/2024   Obesity, Class I, BMI 30-34.9 01/16/2024   Hx of colonic polyps 10/18/2023   Abdominal pain 10/18/2023   Incomplete emptying of bladder 05/03/2023   Glioblastoma of temporal lobe (HCC) 04/18/2023   Benign prostatic hyperplasia with urinary retention 04/11/2023   Mixed hyperlipidemia 04/11/2023   Persistent hypersomnia 02/08/2022   Parasomnia associated with another health condition 03/20/2021   Non-restorative sleep 03/20/2021   Nightmares REM-sleep type 02/05/2021   Excessive daytime sleepiness 02/05/2021   OSA on CPAP 02/05/2021   Glioblastoma, IDH-wildtype (HCC) 02/05/2021   Disequilibrium 04/06/2019   History of malignant neoplasm of brain 03/23/2019   Neoplasm of brain (HCC) 03/23/2019   Lipoprotein deficiency disorder 11/21/2018   Encounter for screening for other disorder 11/11/2017   Pain of right shoulder region 07/05/2017   Adjustment disorder with depressed mood 11/01/2016   Secondary polycythemia 10/30/2015   Encounter for general adult medical examination without abnormal findings 10/23/2015   Erectile dysfunction 07/29/2015   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/27/2015   Paresthesia 09/25/2014   Neoplasm of uncertain behavior of thyroid  gland, left lobe 04/26/2012   Retinal hemorrhage 12/29/2011   Encounter for other general examination 06/17/2010  Asymptomatic microscopic hematuria 06/23/2009   Right knee pain 03/24/2009   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 He has been unable to pull up the HEP on his phone. OT reviewed and provided instruction on how to properly access Medbridge on the patient's device. Despite the difficulty, the patient has continued to perform the exercises to the best of his ability.  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 Buckley was brought to the ER after patient was found to have left-sided weakness following which patient had seizure-like activity on the left side at around 9 PM yesterday at home.  Patient has been having headache for the last 24 hours and he tried some Tylenol  and also some leftover oxycodone  the headache eased up little bit.  Later in the evening around 9 PM when he was walking to the bathroom when he had a fall but did not hit his head or lose consciousness.  Patient's wife states he was with him she helped him to the bathroom and while on the commode he started developing left-sided weakness with seizure and EMS was called.  Patient's wife states that over the last 3 weeks he has been having some poor vision in the left eye.  PRECAUTIONS: Fall and Other: seizures   WEIGHT BEARING RESTRICTIONS: No  PAIN:  Are you having pain? No  FALLS: Has patient fallen in last 6 months? Yes. Number of falls too many to count,  had 3 on 8/17 when he had his seizure that led to ED visit   LIVING ENVIRONMENT: Lives with: lives with their spouse (but she works during the day) Lives in: House/apartment Stairs: Yes: Internal: 12 steps; on right going up and External: 2-4 steps; on right going up Has following equipment at home: None  PLOF: Independent; not driving; retired from Doctor, hospital, Comptroller, and Lt. Colonel from [employer]; enjoys general repairs  PATIENT GOALS: return to driving; avoid bumping into items  OBJECTIVE:  Note: Objective measures were completed at Evaluation unless otherwise noted.  HAND DOMINANCE: Right  ADLs: Overall ADLs: mod I  IADLs:  Meal Prep: mod I though bumps into things in the kitchen Medication management: mod I  Typing: issues with knowing where the keys are and has issues identifying emails  MOBILITY STATUS: Independent and Hx of falls  ACTIVITY TOLERANCE: Activity tolerance: good to fair  FUNCTIONAL OUTCOME MEASURES: PSFS: 5.7 total score   Total score = sum of the activity scores/number of activities Minimum detectable change (90%CI) for average score = 2 points Minimum detectable change (90%CI) for single activity  score = 3 points   UPPER EXTREMITY ROM and MMT:    BUE: WNL  HAND FUNCTION: Grip strength: Right: 98.1 lbs; Left: 65.6 lbs  COORDINATION: 9 Hole Peg test: Right: 34 sec; Left: 41 sec; with errors to L side  SENSATION: Paresthesias reported upon waking to UEs   EDEMA: none reported or observed  MUSCLE TONE: WFL  COGNITION: Overall cognitive status: Within functional limits for tasks assessed  VISION: Subjective report: Bumps into items on his L side Baseline vision: Wears glasses all the time Visual history: cataracts and brain injury  VISION ASSESSMENT: Impaired  Patient has difficulty with following activities due to following visual impairments: 30* of visual field on the left with inferior  cut  PERCEPTION: Impaired: See above  PRAXIS: WFL  OBSERVATIONS: Pt ambulates without use of AD. No loss of balance. The pt appears well kept and has glasses donned. At times demonstrated errors with manipulation of items in Left visual field.                                                                                                                           TREATMENT : - Therapeutic activities completed for duration as noted below including:  Patient engaged in a BUE coordination task designed to promote visual scanning. The task involved placing and removing larger, colored pegs into corresponding holes on pattern sheets positioned on the patient's left side to encourage scanning of the left visual field. Initially, the patient missed components located in the lower left visual field and required moderate cueing to implement compensatory strategies for improved accuracy and task completion. To facilitate full visual scanning, colored paper was placed at the bottom edge of the work surface, serving as a visual boundary. The patient was instructed to scan until identifying the yellow paper, signaling the task's completion boundary. Additionally, tactile feedback was encouraged by having the patient touch and count the pegs on both the board and the pattern sheet to enhance accuracy. Despite these supports, the patient still missed some details such as the quantity of colored pegs needed. The patient was then prompted to count the number of each colored peg shown on the pattern before selecting and placing them. With these strategies in place, the patient demonstrated increased accuracy and improved task completion.  Patient engaged in a word search activity to further promote visual scanning, utilizing the previously introduced compensatory strategies. A red piece of paper was placed at the left border of the worksheet to encourage the patient to scan fully to the left when searching for words.The  patient demonstrated increased use of this strategy and was able to complete the word search independently, with no cueing needed for accuracy or task completion. A similar word search was provided for home practice to reinforce these skills. The patient was also encouraged to continue using visual anchors, such as the red piece of paper, during daily activities at home to improve visual scanning and reduce missed information  in the left visual field.  Patient also participated in a gross motor coordination activity to promote strengthening of the L UE as well as visual scanning. Using a resisted gripper, the patient was instructed to scan to pick up blocks located on L side one at a time by the color called by the OT. Patient was then instructed to pick up the blocks and place them into a larger container placed on his R side. It was observed that when prompted to scan for the colored blocks, the pt would go for the blocks placed at a higher visual field rather than those placed in his L lower field. Pt also had slight difficulty with maintaining a tight grasp on the resisted gripper dropping a few of the blocks indicating some decreased grip endurance and muscle fatigue of the LUE.     PATIENT EDUCATION: Education details: Visual compensatory strategies Person educated: Patient Education method: Explanation, Demonstration, Tactile cues, Verbal cues, and Handouts Education comprehension: verbalized understanding, returned demonstration, verbal cues required, tactile cues required, and needs further education  HOME EXERCISE PROGRAM: 03/01/2024: Landy putty exercises( access code;[email or web address]), golf solitaire 03/08/2024: Visual compensatory strategies, neck stretches 03/13/2024: Neck stretches HEP 03/20/2024: Word search, visual anchors (given red paper for home carryover)   GOALS:  SHORT TERM GOALS: Target date: 03/27/2024   Patient will demonstrate initial L UE HEP with 25% verbal cues or less for  proper execution. Baseline: Goal status: IN PROGRESS  2.  Patient will demonstrate at least 75 lbs L grip strength as needed to open jars and other containers. Baseline: Right: 98.1 lbs; Left: 65.6 lbs Goal status: IN PROGRESS  3.  Patient will independently recall at least 2 compensatory strategies for visual impairment without cueing. Baseline:  Goal status: IN PROGRESS  LONG TERM GOALS: Target date: 04/10/2024   Patient will demonstrate updated L UE HEP with visual handouts only for proper execution. Baseline:  Goal status: IN PROGRESS  2.  Patient will report at least two-point increase in average PSFS score or at least three-point increase in a single activity score indicating functionally significant improvement given minimum detectable change.  Baseline: 5.7 total score (See above for individual activity scores) Goal status: IN PROGRESS  3.  Patient will demo improved FM coordination as evidenced by completing nine-hole peg with use of L in 36 seconds or less without errors in L visual field. Baseline: Right: 34 sec; Left: 41 sec Goal status: IN PROGRESS  ASSESSMENT:  CLINICAL IMPRESSION: Patient presents with L sided weakness and Visuospatial deficits secondary to glioblastoma. He demonstrates fair rehab potential as evidence to verbalizing understanding of HEP and visual compensatory strategies. Pt will continue to benefit from skilled outpatient OT to improve L sided weakness and to address vision deficits for functional independence in ADLs and IADLs.   PERFORMANCE DEFICITS: in functional skills including ADLs, IADLs, coordination, sensation, strength, Fine motor control, endurance, decreased knowledge of precautions, decreased knowledge of use of DME, vision, and UE functional use.   IMPAIRMENTS: are limiting patient from ADLs, IADLs, rest and sleep, and leisure.   CO-MORBIDITIES: has co-morbidities such as glioblastoma, DM, seizures that affects occupational  performance. Patient will benefit from skilled OT to address above impairments and improve overall function.  OT OCCUPATIONAL PROFILE AND HISTORY: Detailed assessment: Review of records and additional review of physical, cognitive, psychosocial history related to current functional performance.   REHAB POTENTIAL: Fair given medical history   PLAN:  OT FREQUENCY: 2x/week  OT DURATION: 6 weeks  PLANNED INTERVENTIONS: 97168 OT Re-evaluation, 97535 self care/ADL training, 02889 therapeutic exercise, 97530 therapeutic activity, 97112 neuromuscular re-education, 97018 paraffin, 02960 fluidotherapy, 97010 moist heat, 97750 Physical Performance Testing, functional mobility training, visual/perceptual remediation/compensation, coping strategies training, patient/family education, and DME and/or AE instructions  RECOMMENDED OTHER SERVICES: N/A for this visit  CONSULTED AND AGREED WITH PLAN OF CARE: Patient  PLAN FOR NEXT SESSION: Assess goals Review HEP Visual scanning activities using visual anchors as needed- connect 4 Strengthening activities  Explore hobbies with repairs  Keefer Soulliere, Student-OT 03/20/2024, 5:14 PM

## 2024-03-22 ENCOUNTER — Ambulatory Visit: Admitting: Occupational Therapy

## 2024-03-22 ENCOUNTER — Other Ambulatory Visit (HOSPITAL_BASED_OUTPATIENT_CLINIC_OR_DEPARTMENT_OTHER): Payer: Self-pay

## 2024-03-22 ENCOUNTER — Telehealth: Payer: Self-pay | Admitting: Internal Medicine

## 2024-03-22 DIAGNOSIS — M6281 Muscle weakness (generalized): Secondary | ICD-10-CM | POA: Diagnosis not present

## 2024-03-22 DIAGNOSIS — R29818 Other symptoms and signs involving the nervous system: Secondary | ICD-10-CM

## 2024-03-22 DIAGNOSIS — R41842 Visuospatial deficit: Secondary | ICD-10-CM

## 2024-03-22 DIAGNOSIS — R278 Other lack of coordination: Secondary | ICD-10-CM

## 2024-03-22 DIAGNOSIS — R29898 Other symptoms and signs involving the musculoskeletal system: Secondary | ICD-10-CM

## 2024-03-22 NOTE — Therapy (Addendum)
 OUTPATIENT OCCUPATIONAL THERAPY NEURO TREATMENT  Patient Name: Randall Lewis MRN: [id number] DOB:[birth year], 68 y.o., male Today's Date: 03/22/2024  PCP: Yolande Toribio MATSU, MD  REFERRING PROVIDER: Vaslow, Zachary K, MD  END OF SESSION:  OT End of Session - 03/22/24 1442     Visit Number 7    Number of Visits 13    Date for Recertification  04/10/24    Authorization Type Medicare    OT Start Time 1444    OT Stop Time 1532    OT Time Calculation (min) 48 min    Equipment Utilized During Treatment Connect 4    Activity Tolerance Patient tolerated treatment well    Behavior During Therapy WFL for tasks assessed/performed          Past Medical History:  Diagnosis Date   Anxiety    BPH (benign prostatic hyperplasia)    Cancer (HCC) 05/31/2004   Brain tumor, in remission after chemo radiation and surgery   Diabetes mellitus without complication (HCC)    ED (erectile dysfunction)    Heart murmur    History of heart murmur   Hyperlipidemia    Hypertension    Sleep apnea    USES C-PAP   UTI (urinary tract infection)    Past Surgical History:  Procedure Laterality Date   BRAIN SURGERY  2006   BRAIN TUMOR  - CANCER   COLONOSCOPY     CYSTOSCOPY N/A 07/29/2015   Procedure: FLEXIBLE CYSTOSCOPY;  Surgeon: Norleen Seltzer, MD;  Location: [facility];  Service: Urology;  Laterality: N/A;   FOOT BONE EXCISION  1989   PENILE PROSTHESIS IMPLANT N/A 07/29/2015   Procedure: PENILE PROSTHESIS;  Surgeon: Norleen Seltzer, MD;  Location: [facility];  Service: Urology;  Laterality: N/A;   right knee meniscus repair  2010   THYROID  LOBECTOMY  05/12/2012   Procedure: THYROID  LOBECTOMY;  Surgeon: Krystal CHRISTELLA Spinner, MD;  Location: [facility];  Service: General;  Laterality: Left;  left thyroid  lobectomy   Patient Active Problem List   Diagnosis Date Noted   Anemia 03/06/2024   Bladder outlet obstruction 03/06/2024   Nontoxic single thyroid  nodule 03/06/2024   Peripheral vascular disease 03/06/2024    Seizures (HCC) 01/16/2024   Depression 01/16/2024   Essential hypertension 01/16/2024   Diabetes mellitus type 2 in nonobese (HCC) 01/16/2024   Acute metabolic acidosis 01/16/2024   Obesity, Class I, BMI 30-34.9 01/16/2024   Hx of colonic polyps 10/18/2023   Abdominal pain 10/18/2023   Incomplete emptying of bladder 05/03/2023   Glioblastoma of temporal lobe (HCC) 04/18/2023   Benign prostatic hyperplasia with urinary retention 04/11/2023   Mixed hyperlipidemia 04/11/2023   Persistent hypersomnia 02/08/2022   Parasomnia associated with another health condition 03/20/2021   Non-restorative sleep 03/20/2021   Nightmares REM-sleep type 02/05/2021   Excessive daytime sleepiness 02/05/2021   OSA on CPAP 02/05/2021   Glioblastoma, IDH-wildtype (HCC) 02/05/2021   Disequilibrium 04/06/2019   History of malignant neoplasm of brain 03/23/2019   Neoplasm of brain (HCC) 03/23/2019   Lipoprotein deficiency disorder 11/21/2018   Encounter for screening for other disorder 11/11/2017   Pain of right shoulder region 07/05/2017   Adjustment disorder with depressed mood 11/01/2016   Secondary polycythemia 10/30/2015   Encounter for general adult medical examination without abnormal findings 10/23/2015   Erectile dysfunction 07/29/2015   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/27/2015   Paresthesia 09/25/2014   Neoplasm of uncertain behavior of thyroid  gland, left lobe 04/26/2012   Retinal hemorrhage 12/29/2011  Encounter for other general examination 06/17/2010   Asymptomatic microscopic hematuria 06/23/2009   Right knee pain 03/24/2009   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 He has been more compliant with his HEP now that he is able to access his exercises on his phone.   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 Buckley was brought to the ER after patient was found to have left-sided weakness following which patient had seizure-like activity on the left side at around 9 PM yesterday at home.  Patient has been having headache for the last 24 hours and he tried some Tylenol  and also some leftover oxycodone  the headache eased up little bit.  Later in the evening around 9 PM when he was walking to the bathroom when he had a fall but did not hit his head or lose consciousness.  Patient's wife states he was with him she helped him to the bathroom and while on the commode he started developing left-sided weakness with seizure and EMS was called.  Patient's wife states that over the last 3 weeks he has been having some poor vision in the left eye.  PRECAUTIONS: Fall and Other: seizures   WEIGHT BEARING RESTRICTIONS: No  PAIN:  Are you having pain? No  FALLS: Has patient fallen in last 6 months? Yes. Number of falls too many to count, had 3 on 8/17 when he had his seizure that led to ED visit   LIVING ENVIRONMENT: Lives with: lives  with their spouse (but she works during the day) Lives in: House/apartment Stairs: Yes: Internal: 12 steps; on right going up and External: 2-4 steps; on right going up Has following equipment at home: None  PLOF: Independent; not driving; retired from Doctor, hospital, Comptroller, and Lt. Colonel from [employer]; enjoys general repairs  PATIENT GOALS: return to driving; avoid bumping into items  OBJECTIVE:  Note: Objective measures were completed at Evaluation unless otherwise noted.  HAND DOMINANCE: Right  ADLs: Overall ADLs: mod I  IADLs:  Meal Prep: mod I though bumps into things in the kitchen Medication management: mod I  Typing: issues with knowing where the keys are and has issues identifying emails  MOBILITY STATUS: Independent and Hx of falls  ACTIVITY TOLERANCE: Activity tolerance: good to fair  FUNCTIONAL OUTCOME MEASURES: PSFS: 5.7 total score   Total score = sum of the activity scores/number of activities Minimum detectable change (90%CI) for average score = 2 points Minimum detectable change (90%CI) for single activity score = 3 points   UPPER EXTREMITY ROM and MMT:    BUE: WNL  HAND FUNCTION: Grip strength: Right: 98.1 lbs; Left: 65.6 lbs  COORDINATION: 9 Hole Peg test: Right: 34 sec; Left: 41 sec; with errors to L side  SENSATION: Paresthesias reported upon waking to UEs   EDEMA: none reported or observed  MUSCLE TONE: WFL  COGNITION: Overall cognitive status: Within functional limits for tasks assessed  VISION: Subjective report: Bumps into items on his L side Baseline vision: Wears glasses all the time Visual history: cataracts and brain injury  VISION ASSESSMENT: Impaired  Patient has difficulty with following activities due to following visual impairments: 30* of visual field on the left with inferior cut  PERCEPTION: Impaired: See above  PRAXIS: WFL  OBSERVATIONS: Pt ambulates without use of AD. No loss of  balance. The pt appears well kept and has glasses donned. At times demonstrated errors with manipulation of items in Left visual field.                                                                                                                           TREATMENT : - Therapeutic activities completed for duration as noted below including:  Patient verbalized that he was able to complete the word search provided during the previous session using the red piece of paper as a visual anchor to assist with scanning all the way to the left. He expressed interest in completing additional word searches and was provided one containing more words than the previous activity, initially without the use of the visual anchor. The patient successfully completed the task independently with 95% accuracy. It was observed that he moved the paper to the right to improve his scanning ability. Additionally, when initiating the task, he began identifying words listed on the bottom right side of the page rather than scanning from left to right. Pt was instructed to scan from left to right and top to bottom using an organized scanning pattern.  Patient then participated in a fine/ gross motor coordination activity aimed to promote strengthening of the L UE and improve visual scanning. Using resisted clips, the patient was instructed to scan the table to locate and  use his L UE to pick up different colored clips scattered across the surface, encouraging scanning from left to right. It was observed that when prompted to locate the colored clips, the patient tended to reach for those positioned in the higher visual field rather than those in the lower left visual field. The patient typically turned his head only halfway to the left during scanning. As a result, he missed three clips, requiring verbal cues to rescan the area. He questioned why he often missed clips placed in his lower left visual field. OT encouraged the patient to  ensure that when scanning to the left, his chin is nearly touching his shoulder to confirm a full head turn, which helps bring items into his right visual field.  Patient also participated in 3 games of Connect 4 requiring moderate verbal cues for proper play  and no verbal cues to only use affected left hand with manipulation of game pieces for fine motor coordination, reaction time, scanning and locating of items, and processing.    PATIENT EDUCATION: Education details: Visual compensatory strategies Person educated: Patient Education method: Explanation, Demonstration, Tactile cues, Verbal cues, and Handouts Education comprehension: verbalized understanding, returned demonstration, verbal cues required, tactile cues required, and needs further education  HOME EXERCISE PROGRAM: 03/01/2024: Landy putty exercises( access code;[email or web address]), golf solitaire 03/08/2024: Visual compensatory strategies, neck stretches 03/13/2024: Neck stretches HEP 03/20/2024: Word search, visual anchors (given red paper for home carryover)   GOALS:  SHORT TERM GOALS: Target date: 03/27/2024   Patient will demonstrate initial L UE HEP with 25% verbal cues or less for proper execution. Baseline: Goal status: IN PROGRESS  2.  Patient will demonstrate at least 75 lbs L grip strength as needed to open jars and other containers. Baseline: Right: 98.1 lbs; Left: 65.6 lbs Goal status: IN PROGRESS  3.  Patient will independently recall at least 2 compensatory strategies for visual impairment without cueing. Baseline:  Goal status: IN PROGRESS  LONG TERM GOALS: Target date: 04/10/2024   Patient will demonstrate updated L UE HEP with visual handouts only for proper execution. Baseline:  Goal status: IN PROGRESS  2.  Patient will report at least two-point increase in average PSFS score or at least three-point increase in a single activity score indicating functionally significant improvement given minimum detectable  change.  Baseline: 5.7 total score (See above for individual activity scores) Goal status: IN PROGRESS  3.  Patient will demo improved FM coordination as evidenced by completing nine-hole peg with use of L in 36 seconds or less without errors in L visual field. Baseline: Right: 34 sec; Left: 41 sec Goal status: IN PROGRESS  ASSESSMENT:  CLINICAL IMPRESSION: Patient presents with L sided weakness and Visuospatial deficits secondary to glioblastoma. He demonstrates fair rehab potential as evidence to verbalizing understanding of HEP and visual compensatory strategies. Pt will continue to benefit from skilled outpatient OT to improve L sided weakness and to address vision deficits for functional independence in ADLs and IADLs.   PERFORMANCE DEFICITS: in functional skills including ADLs, IADLs, coordination, sensation, strength, Fine motor control, endurance, decreased knowledge of precautions, decreased knowledge of use of DME, vision, and UE functional use.   IMPAIRMENTS: are limiting patient from ADLs, IADLs, rest and sleep, and leisure.   CO-MORBIDITIES: has co-morbidities such as glioblastoma, DM, seizures that affects occupational performance. Patient will benefit from skilled OT to address above impairments and improve overall function.  OT OCCUPATIONAL PROFILE AND HISTORY: Detailed assessment: Review of records and additional review of physical, cognitive, psychosocial history related to current functional performance.   REHAB POTENTIAL: Fair given medical history   PLAN:  OT FREQUENCY: 2x/week  OT DURATION: 6 weeks  PLANNED INTERVENTIONS: 97168 OT Re-evaluation, 97535 self care/ADL training, 02889 therapeutic exercise, 97530 therapeutic activity, 97112 neuromuscular re-education, 97018 paraffin, 02960 fluidotherapy, 97010 moist heat, 97750 Physical Performance Testing, functional mobility training, visual/perceptual remediation/compensation, coping strategies training,  patient/family education, and DME and/or AE instructions  RECOMMENDED OTHER SERVICES: N/A for this visit  CONSULTED AND AGREED WITH PLAN OF CARE: Patient  PLAN FOR NEXT SESSION:  Review HEP Assess/ Update goals Visual scanning activities using visual anchors as needed Strengthening activities - UB bike  Explore hobbies with repairs - nuts and bolts  Simran Mannis, Student-OT 03/22/2024, 4:00 PM

## 2024-03-22 NOTE — Telephone Encounter (Signed)
 I spoke with patient to reschedule appointments in November. He is aware of all changes.

## 2024-03-23 ENCOUNTER — Ambulatory Visit: Payer: Self-pay | Admitting: Neurology

## 2024-03-23 NOTE — Procedures (Signed)
 [facility]  EEG (ELECTROENCEPHALOGRAM) REPORT  Randall Lewis    ORDERING CLINICIAN: Dedra Gores, M.D.  TECHNOLOGIST: , Burnard Plummer  REEGT TECHNIQUE:  This EEG study was done with scalp electrodes positioned according to the 10-20 International system of electrode placement. Electrical activity was reviewed with band pass filter of 1-70Hz , sensitivity of 7 uV/mm, display speed of 25mm/sec with a 60Hz  notched filter applied as appropriate. EEG data were recorded continuously and digitally stored.    Recoding duration : 26:48  Activation included:  Photic stimulation and Hyperventilation  .      Description: The EEG's posterior dominant background rhythm of 8 hertz was seen . The amplitude was higher over the left hemisphere,displayed while the patient's eyes were closed  and promptly attenuated with eye opening. At baseline, the recording showed a low amplitude asymmetric activity.  Suspected  breech artefact over left fronto temporal area and  slow irregular activity with  phase reversal activity over the right central parietal  area.   Photic stimulation was initiated at frequencies from 3- through 21 hertz, resulting in no additional changes , periodic or rhythmic discharges, or epileptiform activity.  Hyperventilation maneuver was initiated,  leading to amplitude build-up and poorly formed activity, as well as further slowing over T 8/ P 8  . Sharp waves were noted at the same time over the left fronto temporal brain .   Following hyperventilation the patient's EEG was reviewed for a period of 1 and 2 minutes post maneuver, and showed a dysrhythmia over the right brain, plus persistent  signs of  breech artefact over the left frontal and right parietal areas.    The patient was recorded while awake, while drowsy.   ECG: regular heart rate at  58-60 BPM.   IMPRESSION:  This EEG is abnormal .    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  Medication for prevention of seizures is needed.      Dr. Dedra Gores, M.D. Accredited by the ABPN, ABSM.

## 2024-03-26 ENCOUNTER — Other Ambulatory Visit: Payer: Self-pay

## 2024-03-26 ENCOUNTER — Other Ambulatory Visit (HOSPITAL_COMMUNITY): Payer: Self-pay

## 2024-03-26 ENCOUNTER — Other Ambulatory Visit: Payer: Self-pay | Admitting: *Deleted

## 2024-03-26 ENCOUNTER — Ambulatory Visit (HOSPITAL_COMMUNITY): Admission: RE | Admit: 2024-03-26 | Source: Ambulatory Visit

## 2024-03-26 ENCOUNTER — Telehealth: Payer: Self-pay | Admitting: *Deleted

## 2024-03-26 ENCOUNTER — Encounter: Payer: Self-pay | Admitting: Internal Medicine

## 2024-03-26 DIAGNOSIS — C719 Malignant neoplasm of brain, unspecified: Secondary | ICD-10-CM

## 2024-03-26 MED ORDER — PREDNISONE 50 MG PO TABS
ORAL_TABLET | ORAL | 0 refills | Status: DC
Start: 2024-03-26 — End: 2024-03-26

## 2024-03-26 MED ORDER — PREDNISONE 50 MG PO TABS
ORAL_TABLET | ORAL | 0 refills | Status: AC
Start: 2024-03-26 — End: ?
  Filled 2024-03-26: qty 9, 3d supply, fill #0

## 2024-03-26 NOTE — Telephone Encounter (Signed)
 Returned PC to patient, he called saying he has MRI scheduled today & is out of his prednisone  to take prior due to IV contrast allergy.  MRI rescheduled to 03/28/24 at [facility] at 2:00.  Rx refill for prednisone  sent to [facility].  He verbalizes understanding.

## 2024-03-27 ENCOUNTER — Ambulatory Visit: Admitting: Occupational Therapy

## 2024-03-27 DIAGNOSIS — M6281 Muscle weakness (generalized): Secondary | ICD-10-CM | POA: Diagnosis not present

## 2024-03-27 DIAGNOSIS — R41842 Visuospatial deficit: Secondary | ICD-10-CM

## 2024-03-27 DIAGNOSIS — R29818 Other symptoms and signs involving the nervous system: Secondary | ICD-10-CM

## 2024-03-27 DIAGNOSIS — R278 Other lack of coordination: Secondary | ICD-10-CM

## 2024-03-27 DIAGNOSIS — R29898 Other symptoms and signs involving the musculoskeletal system: Secondary | ICD-10-CM

## 2024-03-27 NOTE — Therapy (Addendum)
 OUTPATIENT OCCUPATIONAL THERAPY NEURO TREATMENT  Patient Name: Randall Lewis MRN: [id number] DOB:14-Jun-1955, 68 y.o., male Today's Date: 03/27/2024  PCP: Yolande Toribio MATSU, MD  REFERRING PROVIDER: Vaslow, Zachary K, MD  END OF SESSION:  OT End of Session - 03/27/24 1425     Visit Number 8    Number of Visits 13    Date for Recertification  04/10/24    Authorization Type Medicare    OT Start Time 1445    OT Stop Time 1530    OT Time Calculation (min) 45 min    Equipment Utilized During Treatment --    Activity Tolerance Patient tolerated treatment well    Behavior During Therapy [facility] for tasks assessed/performed         Past Medical History:  Diagnosis Date   Anxiety    BPH (benign prostatic hyperplasia)    Cancer (HCC) 05/31/2004   Brain tumor, in remission after chemo radiation and surgery   Diabetes mellitus without complication (HCC)    ED (erectile dysfunction)    Heart murmur    History of heart murmur   Hyperlipidemia    Hypertension    Sleep apnea    USES C-PAP   UTI (urinary tract infection)    Past Surgical History:  Procedure Laterality Date   BRAIN SURGERY  2006   BRAIN TUMOR  - CANCER   COLONOSCOPY     CYSTOSCOPY N/A 07/29/2015   Procedure: FLEXIBLE CYSTOSCOPY;  Surgeon: Norleen Seltzer, MD;  Location: [facility];  Service: Urology;  Laterality: N/A;   FOOT BONE EXCISION  1989   PENILE PROSTHESIS IMPLANT N/A 07/29/2015   Procedure: PENILE PROSTHESIS;  Surgeon: Norleen Seltzer, MD;  Location: [facility];  Service: Urology;  Laterality: N/A;   right knee meniscus repair  2010   THYROID  LOBECTOMY  05/12/2012   Procedure: THYROID  LOBECTOMY;  Surgeon: Krystal CHRISTELLA Spinner, MD;  Location: [facility];  Service: General;  Laterality: Left;  left thyroid  lobectomy   Patient Active Problem List   Diagnosis Date Noted   Anemia 03/06/2024   Bladder outlet obstruction 03/06/2024   Nontoxic single thyroid  nodule 03/06/2024   Peripheral vascular disease 03/06/2024   Seizures (HCC)  01/16/2024   Depression 01/16/2024   Essential hypertension 01/16/2024   Diabetes mellitus type 2 in nonobese (HCC) 01/16/2024   Acute metabolic acidosis 01/16/2024   Obesity, Class I, BMI 30-34.9 01/16/2024   Hx of colonic polyps 10/18/2023   Abdominal pain 10/18/2023   Incomplete emptying of bladder 05/03/2023   Glioblastoma of temporal lobe (HCC) 04/18/2023   Benign prostatic hyperplasia with urinary retention 04/11/2023   Mixed hyperlipidemia 04/11/2023   Persistent hypersomnia 02/08/2022   Parasomnia associated with another health condition 03/20/2021   Non-restorative sleep 03/20/2021   Nightmares REM-sleep type 02/05/2021   Excessive daytime sleepiness 02/05/2021   OSA on CPAP 02/05/2021   Glioblastoma, IDH-wildtype (HCC) 02/05/2021   Disequilibrium 04/06/2019   History of malignant neoplasm of brain 03/23/2019   Neoplasm of brain (HCC) 03/23/2019   Lipoprotein deficiency disorder 11/21/2018   Encounter for screening for other disorder 11/11/2017   Pain of right shoulder region 07/05/2017   Adjustment disorder with depressed mood 11/01/2016   Secondary polycythemia 10/30/2015   Encounter for general adult medical examination without abnormal findings 10/23/2015   Erectile dysfunction 07/29/2015   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/27/2015   Paresthesia 09/25/2014   Neoplasm of uncertain behavior of thyroid  gland, left lobe 04/26/2012   Retinal hemorrhage 12/29/2011  Encounter for other general examination 06/17/2010   Asymptomatic microscopic hematuria 06/23/2009   Right knee pain 03/24/2009   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 He reported that using the visual anchoring technique improved accuracy in scanning and completing the word searches at home.  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 Buckley was brought to the ER after patient was found to have left-sided weakness following which patient had seizure-like activity on the left side at around 9 PM yesterday at home.  Patient has been having headache for the last 24 hours and he tried some Tylenol  and also some leftover oxycodone  the headache eased up little bit.  Later in the evening around 9 PM when he was walking to the bathroom when he had a fall but did not hit his head or lose consciousness.  Patient's wife states he was with him she helped him to the bathroom and while on the commode he started developing left-sided weakness with seizure and EMS was called.  Patient's wife states that over the last 3 weeks he has been having some poor vision in the left eye.  PRECAUTIONS: Fall and Other: seizures   WEIGHT BEARING RESTRICTIONS: No  PAIN:  Are you having pain? No  FALLS: Has patient fallen in last 6 months? Yes. Number of falls too many to count, had 3 on 8/17  when he had his seizure that led to ED visit   LIVING ENVIRONMENT: Lives with: lives with their spouse (but she works during the day) Lives in: House/apartment Stairs: Yes: Internal: 12 steps; on right going up and External: 2-4 steps; on right going up Has following equipment at home: None  PLOF: Independent; not driving; retired from doctor, hospital, comptroller, and Lt. Colonel from [employer]; enjoys general repairs  PATIENT GOALS: return to driving; avoid bumping into items  OBJECTIVE:  Note: Objective measures were completed at Evaluation unless otherwise noted.  HAND DOMINANCE: Right  ADLs: Overall ADLs: mod I  IADLs:  Meal Prep: mod I though bumps into things in the kitchen Medication management: mod I  Typing: issues with knowing where the keys are and has issues identifying emails  MOBILITY STATUS: Independent and Hx of falls  ACTIVITY TOLERANCE: Activity tolerance: good to fair  FUNCTIONAL OUTCOME MEASURES: PSFS: 5.7 total score   Total score = sum of the activity scores/number of activities Minimum detectable change (90%CI) for average score = 2 points Minimum detectable change (90%CI) for single activity score = 3 points   UPPER EXTREMITY  ROM and MMT:    BUE: WNL  HAND FUNCTION: Grip strength: Right: 98.1 lbs; Left: 65.6 lbs Grip strength: Right: 60.1, 65.0, 65.4 lbs; Left: 45.8, 48.5, 56.8  lbs Average: 63.5 lbs (R); 50.4 lbs (L)  COORDINATION: 9 Hole Peg test: Right: 34 sec; Left: 41 sec; with errors to L side  SENSATION: Paresthesias reported upon waking to UEs   EDEMA: none reported or observed  MUSCLE TONE: WFL  COGNITION: Overall cognitive status: Within functional limits for tasks assessed  VISION: Subjective report: Bumps into items on his L side Baseline vision: Wears glasses all the time Visual history: cataracts and brain injury  VISION ASSESSMENT: Impaired  Patient has difficulty with following activities due  to following visual impairments: 30* of visual field on the left with inferior cut  PERCEPTION: Impaired: See above  PRAXIS: WFL  OBSERVATIONS: Pt ambulates without use of AD. No loss of balance. The pt appears well kept and has glasses donned. At times demonstrated errors with manipulation of items in Left visual field.                                                                                                                           TREATMENT : - Therapeutic exercises completed for duration as noted below including:  OT reviewed the HEP to assess the patient's progress toward meeting short-term goals and to ensure proper technique and carryover of therapeutic strategies. Review included putty exercises and neck stretches. Including the following   - Putty Squeezes - cues to squeeze putty into log for use with other exercises and to fold putty in half with 1 hand  - Putty Rolls - encourage to roll putty into logs with sensory stimulation to entire length of hand, fingers and wrist as needed   - Pinch and Pull with Putty - this motion is combined with different pinches (3-Point Pinch, Tip Pinch, Key Pinch) - patient encouraged to combine tripod, pincer and/or key pinch with pinch and pull motion of putty pulling away from midline, changing between different pinches and changing different directions to change grip  - Finger Extension with Putty - pt shown how to work on task with all fingers and thumb as well as individual fingers in opposition to thumb  - Finger Adduction with Putty - pt shown how to work on weaving putty between fingers/thumb and then squeeze fingers together while laying hand flat on table top.  - Removing Objects from Putty  - encouraged to hide items (coins, marble, dice etc) and use one hand at a time to find the objects and identify them by tactile input before s/he digs them out and can see them visually.  Patient benefited from extra time, verbal/tactile cues,  and modeling of task to allow time for processing of verbal instructions and improve motor planning of unfamiliar movements.   Neck exercises were also reviewed  to encourage stretching to prevent stiffness on the left side of the neck, which  may result from frequently turning the head to the left to compensate for a left visual field cut. Please refer to pt's previous instructions for more information on specific exercises. Pt verbalized that he has no concerns with these exercises at this time.    - Therapeutic activities completed for duration as noted below including:  Patient also participated in a gross motor coordination activity to promote strengthening of the B UE. Using a resisted gripper, the patient was instructed to pick up blocks one at a time, stack them in groups of two, then in groups of four using the gripper. Patient was then instructed to pick up the stacks of four, maintaining grip on all blocks, and transfer them into a bowl placed on the right side. Patient dropped a couple of stacks near the end of each set of gripping activities, indicating some decreased grip endurance and muscle fatigue of the RUE.    Grip strength was also reassessed, and the patient has not made significant progress at this time. The patient was encouraged to continue performing the HEP twice daily instead of once and to use his hands as much as possible for functional daily activities to promote strength, endurance, and overall hand function.  PATIENT EDUCATION: Education details: Reviewed HEP- provided feedback and cues as needed Person educated: Patient Education method: Explanation, Demonstration, Tactile cues, and Verbal cues Education comprehension: verbalized understanding, returned demonstration, verbal cues required, tactile cues required, and needs further education  HOME EXERCISE PROGRAM: 03/01/2024: Landy putty exercises( access code;[email or web address]), golf solitaire 03/08/2024: Visual compensatory  strategies, neck stretches 03/13/2024: Neck stretches HEP 03/20/2024: Word search, visual anchors (given red paper for home carryover)   GOALS:  SHORT TERM GOALS: Target date: 03/27/2024   Patient will demonstrate initial L UE HEP with 25% verbal cues or less for proper execution. Baseline: Goal status: MET  2.  Patient will demonstrate at least 75 lbs L grip strength as needed to open jars and other containers. Baseline: Right: 98.1 lbs; Left: 65.6 lbs  Goal status: NOT MET- 03/27/24  3.  Patient will independently recall at least 2 compensatory strategies for visual impairment without cueing. Baseline:  Goal status: MET  LONG TERM GOALS: Target date: 04/10/2024   Patient will demonstrate updated L UE HEP with visual handouts only for proper execution. Baseline:  Goal status: IN PROGRESS  2.  Patient will report at least two-point increase in average PSFS score or at least three-point increase in a single activity score indicating functionally significant improvement given minimum detectable change.  Baseline: 5.7 total score (See above for individual activity scores) Goal status: IN PROGRESS  3.  Patient will demo improved FM coordination as evidenced by completing nine-hole peg with use of L in 36 seconds or less without errors in L visual field. Baseline: Right: 34 sec; Left: 41 sec Goal status: IN PROGRESS  ASSESSMENT:  CLINICAL IMPRESSION: Patient presents with L sided weakness and Visuospatial deficits secondary to glioblastoma. He demonstrates fair rehab potential as evidence to verbalizing understanding of HEP and visual compensatory strategies. Pt will continue to benefit from skilled outpatient OT to improve L sided weakness and to address vision deficits for functional independence in ADLs and IADLs.   PERFORMANCE DEFICITS: in functional skills including ADLs, IADLs, coordination, sensation, strength, Fine motor control, endurance, decreased knowledge of  precautions, decreased knowledge of use of DME, vision, and UE functional use.   IMPAIRMENTS: are limiting patient from ADLs, IADLs, rest and sleep, and leisure.   CO-MORBIDITIES: has  co-morbidities such as glioblastoma, DM, seizures that affects occupational performance. Patient will benefit from skilled OT to address above impairments and improve overall function.  OT OCCUPATIONAL PROFILE AND HISTORY: Detailed assessment: Review of records and additional review of physical, cognitive, psychosocial history related to current functional performance.   REHAB POTENTIAL: Fair given medical history   PLAN:  OT FREQUENCY: 2x/week  OT DURATION: 6 weeks  PLANNED INTERVENTIONS: 97168 OT Re-evaluation, 97535 self care/ADL training, 02889 therapeutic exercise, 97530 therapeutic activity, 97112 neuromuscular re-education, 97018 paraffin, 02960 fluidotherapy, 97010 moist heat, 97750 Physical Performance Testing, functional mobility training, visual/perceptual remediation/compensation, coping strategies training, patient/family education, and DME and/or AE instructions  RECOMMENDED OTHER SERVICES: N/A for this visit  CONSULTED AND AGREED WITH PLAN OF CARE: Patient  PLAN FOR NEXT SESSION:  Coordination handout Word search/ cancellation worksheets Visual scanning activities using visual anchors as needed Strengthening activities - UB bike  Gorge Almanza, Student-OT 03/27/2024, 5:19 PM

## 2024-03-28 ENCOUNTER — Ambulatory Visit (HOSPITAL_COMMUNITY)
Admission: RE | Admit: 2024-03-28 | Discharge: 2024-03-28 | Disposition: A | Discharge status: Home / Self Care | Source: Ambulatory Visit | Attending: Internal Medicine | Admitting: Internal Medicine

## 2024-03-28 DIAGNOSIS — C719 Malignant neoplasm of brain, unspecified: Secondary | ICD-10-CM | POA: Insufficient documentation

## 2024-03-28 MED ORDER — GADOBUTROL 1 MMOL/ML IV SOLN
10.0000 mL | Freq: Once | INTRAVENOUS | Status: AC | PRN
  Administered 2024-03-28: 10 mL via INTRAVENOUS

## 2024-03-28 NOTE — Telephone Encounter (Signed)
 Called pt at [phone]. Relayed results per Dr. Lionell note. Pt verbalized understanding. He will keep f/u as planned 09/06/24 at 2:30pm. He will call before then if he develops any new or worsening sx.

## 2024-03-29 ENCOUNTER — Inpatient Hospital Stay

## 2024-03-29 ENCOUNTER — Ambulatory Visit: Admitting: Occupational Therapy

## 2024-03-29 ENCOUNTER — Encounter: Payer: Self-pay | Admitting: *Deleted

## 2024-03-29 ENCOUNTER — Inpatient Hospital Stay (HOSPITAL_BASED_OUTPATIENT_CLINIC_OR_DEPARTMENT_OTHER): Admitting: Internal Medicine

## 2024-03-29 VITALS — BP 120/78 | HR 63 | Temp 97.5°F | Resp 17 | Ht 71.0 in | Wt 208.0 lb

## 2024-03-29 DIAGNOSIS — R29898 Other symptoms and signs involving the musculoskeletal system: Secondary | ICD-10-CM

## 2024-03-29 DIAGNOSIS — C719 Malignant neoplasm of brain, unspecified: Secondary | ICD-10-CM

## 2024-03-29 DIAGNOSIS — C712 Malignant neoplasm of temporal lobe: Secondary | ICD-10-CM

## 2024-03-29 DIAGNOSIS — R29818 Other symptoms and signs involving the nervous system: Secondary | ICD-10-CM

## 2024-03-29 DIAGNOSIS — Z5112 Encounter for antineoplastic immunotherapy: Secondary | ICD-10-CM | POA: Diagnosis not present

## 2024-03-29 DIAGNOSIS — R278 Other lack of coordination: Secondary | ICD-10-CM

## 2024-03-29 DIAGNOSIS — R41842 Visuospatial deficit: Secondary | ICD-10-CM

## 2024-03-29 DIAGNOSIS — M6281 Muscle weakness (generalized): Secondary | ICD-10-CM | POA: Diagnosis not present

## 2024-03-29 LAB — CBC WITH DIFFERENTIAL (CANCER CENTER ONLY)
Abs Immature Granulocytes: 0.02 K/uL (ref 0.00–0.07)
Basophils Absolute: 0 K/uL (ref 0.0–0.1)
Basophils Relative: 0 %
Eosinophils Absolute: 0 K/uL (ref 0.0–0.5)
Eosinophils Relative: 0 %
HCT: 52.7 % — ABNORMAL HIGH (ref 39.0–52.0)
Hemoglobin: 17.8 g/dL — ABNORMAL HIGH (ref 13.0–17.0)
Immature Granulocytes: 0 %
Lymphocytes Relative: 19 %
Lymphs Abs: 1.5 K/uL (ref 0.7–4.0)
MCH: 26.4 pg (ref 26.0–34.0)
MCHC: 33.8 g/dL (ref 30.0–36.0)
MCV: 78.3 fL — ABNORMAL LOW (ref 80.0–100.0)
Monocytes Absolute: 0.7 K/uL (ref 0.1–1.0)
Monocytes Relative: 9 %
Neutro Abs: 5.9 K/uL (ref 1.7–7.7)
Neutrophils Relative %: 72 %
Platelet Count: 158 K/uL (ref 150–400)
RBC: 6.73 MIL/uL — ABNORMAL HIGH (ref 4.22–5.81)
RDW: 17.9 % — ABNORMAL HIGH (ref 11.5–15.5)
WBC Count: 8.1 K/uL (ref 4.0–10.5)
nRBC: 0 % (ref 0.0–0.2)

## 2024-03-29 LAB — CMP (CANCER CENTER ONLY)
ALT: 13 U/L (ref 0–44)
AST: 14 U/L — ABNORMAL LOW (ref 15–41)
Albumin: 4.6 g/dL (ref 3.5–5.0)
Alkaline Phosphatase: 53 U/L (ref 38–126)
Anion gap: 9 (ref 5–15)
BUN: 23 mg/dL (ref 8–23)
CO2: 29 mmol/L (ref 22–32)
Calcium: 10 mg/dL (ref 8.9–10.3)
Chloride: 101 mmol/L (ref 98–111)
Creatinine: 1.22 mg/dL (ref 0.61–1.24)
GFR, Estimated: >60 mL/min (ref >60)
Glucose, Bld: 73 mg/dL (ref 70–99)
Potassium: 3.8 mmol/L (ref 3.5–5.1)
Sodium: 139 mmol/L (ref 135–145)
Total Bilirubin: 0.8 mg/dL (ref 0.0–1.2)
Total Protein: 7.6 g/dL (ref 6.5–8.1)

## 2024-03-29 MED ORDER — SODIUM CHLORIDE 0.9 % IV SOLN
10.0000 mg/kg | Freq: Once | INTRAVENOUS | Status: AC
  Administered 2024-03-29: 1000 mg via INTRAVENOUS
  Filled 2024-03-29: qty 32

## 2024-03-29 MED ORDER — SODIUM CHLORIDE 0.9 % IV SOLN: INTRAVENOUS | Status: DC

## 2024-03-29 NOTE — Progress Notes (Signed)
 Requested that images from MRI brain on 03/28/24 be sent to [facility] through [facility].  Email to [employer].

## 2024-03-29 NOTE — Patient Instructions (Addendum)
        Access Code: [email or web address] URL: [email or web address] Date: 03/29/2024 Prepared by: [email or web address] Tristina Sahagian - Seated Single Arm Shoulder External Rotation with Self-Anchored Resistance  - 1-2 x daily - 10 reps - Seated Upper Extremity Diagonals with Resistance on Swiss Ball  - 1-2 x daily - 10 reps - Shoulder Horizontal Abduction with Resistance on Swiss Ball  - 1-2 x daily - 10 reps

## 2024-03-29 NOTE — Therapy (Addendum)
 OUTPATIENT OCCUPATIONAL THERAPY NEURO TREATMENT  Patient Name: Randall Lewis MRN: [id number] DOB:29-Dec-1955, 68 y.o., male Today's Date: 03/29/2024  PCP: Randall Toribio MATSU, MD  REFERRING PROVIDER: Buckley Arthea POUR, MD  END OF SESSION:  OT End of Session - 03/29/24 1023     Visit Number 9    Number of Visits 13    Date for Recertification  04/10/24    Authorization Type Medicare    OT Start Time 1019    OT Stop Time 1100    OT Time Calculation (min) 41 min    Activity Tolerance Patient tolerated treatment well    Behavior During Therapy WFL for tasks assessed/performed         Past Medical History:  Diagnosis Date   Anxiety    BPH (benign prostatic hyperplasia)    Cancer (HCC) 05/31/2004   Brain tumor, in remission after chemo radiation and surgery   Diabetes mellitus without complication (HCC)    ED (erectile dysfunction)    Heart murmur    History of heart murmur   Hyperlipidemia    Hypertension    Sleep apnea    USES C-PAP   UTI (urinary tract infection)    Past Surgical History:  Procedure Laterality Date   BRAIN SURGERY  2006   BRAIN TUMOR  - CANCER   COLONOSCOPY     CYSTOSCOPY N/A 07/29/2015   Procedure: FLEXIBLE CYSTOSCOPY;  Surgeon: Randall Seltzer, MD;  Location: [facility];  Service: Urology;  Laterality: N/A;   FOOT BONE EXCISION  1989   PENILE PROSTHESIS IMPLANT N/A 07/29/2015   Procedure: PENILE PROSTHESIS;  Surgeon: Randall Seltzer, MD;  Location: [facility];  Service: Urology;  Laterality: N/A;   right knee meniscus repair  2010   THYROID  LOBECTOMY  05/12/2012   Procedure: THYROID  LOBECTOMY;  Surgeon: Randall CHRISTELLA Spinner, MD;  Location: [facility];  Service: General;  Laterality: Left;  left thyroid  lobectomy   Patient Active Problem List   Diagnosis Date Noted   Anemia 03/06/2024   Bladder outlet obstruction 03/06/2024   Nontoxic single thyroid  nodule 03/06/2024   Peripheral vascular disease 03/06/2024   Seizures (HCC) 01/16/2024   Depression 01/16/2024    Essential hypertension 01/16/2024   Diabetes mellitus type 2 in nonobese (HCC) 01/16/2024   Acute metabolic acidosis 01/16/2024   Obesity, Class I, BMI 30-34.9 01/16/2024   Hx of colonic polyps 10/18/2023   Abdominal pain 10/18/2023   Incomplete emptying of bladder 05/03/2023   Glioblastoma of temporal lobe (HCC) 04/18/2023   Benign prostatic hyperplasia with urinary retention 04/11/2023   Mixed hyperlipidemia 04/11/2023   Persistent hypersomnia 02/08/2022   Parasomnia associated with another health condition 03/20/2021   Non-restorative sleep 03/20/2021   Nightmares REM-sleep type 02/05/2021   Excessive daytime sleepiness 02/05/2021   OSA on CPAP 02/05/2021   Glioblastoma, IDH-wildtype (HCC) 02/05/2021   Disequilibrium 04/06/2019   History of malignant neoplasm of brain 03/23/2019   Neoplasm of brain (HCC) 03/23/2019   Lipoprotein deficiency disorder 11/21/2018   Encounter for screening for other disorder 11/11/2017   Pain of right shoulder region 07/05/2017   Adjustment disorder with depressed mood 11/01/2016   Secondary polycythemia 10/30/2015   Encounter for general adult medical examination without abnormal findings 10/23/2015   Erectile dysfunction 07/29/2015   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/27/2015   Paresthesia 09/25/2014   Neoplasm of uncertain behavior of thyroid  gland, left lobe 04/26/2012   Retinal hemorrhage 12/29/2011   Encounter for other general examination 06/17/2010  Asymptomatic microscopic hematuria 06/23/2009   Right knee pain 03/24/2009   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 Also inquired about additional word searches to continue practice of visual scanning.   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 Buckley was brought to the ER after patient was found to have left-sided weakness following which patient had seizure-like activity on the left side at around 9 PM yesterday at home.  Patient has been having headache for the last 24 hours and he tried some Tylenol  and also some leftover oxycodone  the headache eased up little bit.  Later in the evening around 9 PM when he was walking to the bathroom when he had a fall but did not hit his head or lose consciousness.  Patient's wife states he was with him she helped him to the bathroom and while on the commode he started developing left-sided weakness with seizure and EMS was called.  Patient's wife states that over the last 3 weeks he has been having some poor vision in the left eye.  PRECAUTIONS: Fall and Other: seizures   WEIGHT BEARING RESTRICTIONS: No  PAIN:  Are you having pain? No  FALLS: Has patient fallen in last 6 months? Yes. Number of falls too many to count, had 3 on 8/17 when he had his seizure that led to ED visit   LIVING ENVIRONMENT: Lives with: lives with their spouse (but she  works during the day) Lives in: House/apartment Stairs: Yes: Internal: 12 steps; on right going up and External: 2-4 steps; on right going up Has following equipment at home: None  PLOF: Independent; not driving; retired from doctor, hospital, comptroller, and Lt. Colonel from [employer]; enjoys general repairs  PATIENT GOALS: return to driving; avoid bumping into items  OBJECTIVE:  Note: Objective measures were completed at Evaluation unless otherwise noted.  HAND DOMINANCE: Right  ADLs: Overall ADLs: mod I  IADLs:  Meal Prep: mod I though bumps into things in the kitchen Medication management: mod I  Typing: issues with knowing where the keys are and has issues identifying emails  MOBILITY STATUS: Independent and Hx of falls  ACTIVITY TOLERANCE: Activity tolerance: good to fair  FUNCTIONAL OUTCOME MEASURES: PSFS: 5.7 total score   Total score = sum of the activity scores/number of activities Minimum detectable change (90%CI) for average score = 2 points Minimum detectable change (90%CI) for single activity score = 3 points   UPPER EXTREMITY ROM and MMT:    BUE: WNL  HAND FUNCTION: Grip strength: Right: 98.1 lbs; Left:  65.6 lbs Grip strength: Right: 60.1, 65.0, 65.4 lbs; Left: 45.8, 48.5, 56.8  lbs Average: 63.5 lbs (R); 50.4 lbs (L)  COORDINATION: 9 Hole Peg test: Right: 34 sec; Left: 41 sec; with errors to L side  SENSATION: Paresthesias reported upon waking to UEs   EDEMA: none reported or observed  MUSCLE TONE: WFL  COGNITION: Overall cognitive status: Within functional limits for tasks assessed  VISION: Subjective report: Bumps into items on his L side Baseline vision: Wears glasses all the time Visual history: cataracts and brain injury  VISION ASSESSMENT: Impaired  Patient has difficulty with following activities due to following visual impairments: 30* of visual field on the left with inferior cut  PERCEPTION: Impaired: See  above  PRAXIS: WFL  OBSERVATIONS: Pt ambulates without use of AD. No loss of balance. The pt appears well kept and has glasses donned. At times demonstrated errors with manipulation of items in Left visual field.                                                                                                                           TREATMENT : - Therapeutic activities completed for duration as noted below including:  Patient expressed interest in completing additional word searches similar to those provided in previous sessions. OT provided a word search containing 13 words to encourage visual scanning and processing. Patient was able to locate all words independently, without cueing or visual anchors. A copy of the same word search was provided for practice at home.  - Therapeutic exercises completed for duration as noted below including:  OT initiated FM coordination HEP (handout provided, see pt instructions) - to improve L UE FM coordination, dexterity, proprioception. Pt returned demonstration of each exercise: Picking up objects and placing in a container with slot Stacking towers of coins  Finger-to-palm then palm-to-finger translation of small items Shuffling cards Turning cards over 1 at a time Hold deck of cards in palm of hand and push off 1 card at a time from the top of the deck using only thumb Tear piece of tissue and rolling into small balls with fingertips Meditation balls - clockwise then counterclockwise Rolling pen between fingers and thumb, pen twirl (rotation), pen shift (translation)     OT inititated strengthening exercises with green theraband to continue strengthening  B UE and and improve functional endurance for daily activities. Exercises included the following; - Seated Single Arm Shoulder External Rotation with Self-Anchored Resistance   - Seated Upper Extremity Diagonals with Resistance on Swiss Ball  - Shoulder Horizontal Abduction with Resistance on  Swiss Ball   PATIENT EDUCATION: Education details: manufacturing engineer, theraband for UE strengthening Person educated: Patient Education method: Explanation, Demonstration, Tactile cues, and Verbal cues Education comprehension: verbalized understanding, returned demonstration, verbal cues required, tactile cues required, and needs further education  HOME EXERCISE PROGRAM: 03/01/2024: Landy putty exercises( access code;[email or web address]), golf solitaire 03/08/2024: Visual compensatory strategies, neck stretches 03/13/2024: Neck stretches HEP 03/20/2024: Word search,  visual anchors (given red paper for home carryover)  03/29/2024: coordination activities, theraband exercises (same access code as listed above)  GOALS:  SHORT TERM GOALS: Target date: 03/27/2024   Patient will demonstrate initial L UE HEP with 25% verbal cues or less for proper execution. Baseline: Goal status: MET  2.  Patient will demonstrate at least 75 lbs L grip strength as needed to open jars and other containers. Baseline: Right: 98.1 lbs; Left: 65.6 lbs  Goal status: NOT MET- 03/27/24  3.  Patient will independently recall at least 2 compensatory strategies for visual impairment without cueing. Baseline:  Goal status: MET  LONG TERM GOALS: Target date: 04/10/2024   Patient will demonstrate updated L UE HEP with visual handouts only for proper execution. Baseline:  Goal status: IN PROGRESS  2.  Patient will report at least two-point increase in average PSFS score or at least three-point increase in a single activity score indicating functionally significant improvement given minimum detectable change.  Baseline: 5.7 total score (See above for individual activity scores) Goal status: IN PROGRESS  3.  Patient will demo improved FM coordination as evidenced by completing nine-hole peg with use of L in 36 seconds or less without errors in L visual field. Baseline: Right: 34 sec; Left: 41 sec Goal status: IN  PROGRESS  ASSESSMENT:  CLINICAL IMPRESSION: Patient presents with L sided weakness and Visuospatial deficits secondary to glioblastoma. He demonstrates fair rehab potential as evidence to verbalizing understanding of HEP and visual compensatory strategies. Pt will continue to benefit from skilled outpatient OT to improve L sided weakness and to address vision deficits for functional independence in ADLs and IADLs.   PERFORMANCE DEFICITS: in functional skills including ADLs, IADLs, coordination, sensation, strength, Fine motor control, endurance, decreased knowledge of precautions, decreased knowledge of use of DME, vision, and UE functional use.   IMPAIRMENTS: are limiting patient from ADLs, IADLs, rest and sleep, and leisure.   CO-MORBIDITIES: has co-morbidities such as glioblastoma, DM, seizures that affects occupational performance. Patient will benefit from skilled OT to address above impairments and improve overall function.  OT OCCUPATIONAL PROFILE AND HISTORY: Detailed assessment: Review of records and additional review of physical, cognitive, psychosocial history related to current functional performance.   REHAB POTENTIAL: Fair given medical history   PLAN:  OT FREQUENCY: 2x/week  OT DURATION: 6 weeks  PLANNED INTERVENTIONS: 97168 OT Re-evaluation, 97535 self care/ADL training, 02889 therapeutic exercise, 97530 therapeutic activity, 97112 neuromuscular re-education, 97018 paraffin, 02960 fluidotherapy, 97010 moist heat, 97750 Physical Performance Testing, functional mobility training, visual/perceptual remediation/compensation, coping strategies training, patient/family education, and DME and/or AE instructions  RECOMMENDED OTHER SERVICES: N/A for this visit  CONSULTED AND AGREED WITH PLAN OF CARE: Patient  PLAN FOR NEXT SESSION:  [employer] Visual scanning activities Strengthening activities - UB bike  Xela Oregel, Student-OT 03/29/2024, 1:10 PM

## 2024-03-29 NOTE — Patient Instructions (Signed)
 [facility] - A DEPT OF [facility]  Discharge Instructions: Thank you for choosing [facility] to provide your oncology and hematology care.   If you have a lab appointment with the Cancer Center, please go directly to the Cancer Center and check in at the registration area.   Wear comfortable clothing and clothing appropriate for easy access to any Portacath or PICC line.   We strive to give you quality time with your provider. You may need to reschedule your appointment if you arrive late (15 or more minutes).  Arriving late affects you and other patients whose appointments are after yours.  Also, if you miss three or more appointments without notifying the office, you may be dismissed from the clinic at the provider's discretion.      For prescription refill requests, have your pharmacy contact our office and allow 72 hours for refills to be completed.    Today you received the following chemotherapy and/or immunotherapy agents: bevacizumab      To help prevent nausea and vomiting after your treatment, we encourage you to take your nausea medication as directed.  BELOW ARE SYMPTOMS THAT SHOULD BE REPORTED IMMEDIATELY: *FEVER GREATER THAN 100.4 F (38 C) OR HIGHER *CHILLS OR SWEATING *NAUSEA AND VOMITING THAT IS NOT CONTROLLED WITH YOUR NAUSEA MEDICATION *UNUSUAL SHORTNESS OF BREATH *UNUSUAL BRUISING OR BLEEDING *URINARY PROBLEMS (pain or burning when urinating, or frequent urination) *BOWEL PROBLEMS (unusual diarrhea, constipation, pain near the anus) TENDERNESS IN MOUTH AND THROAT WITH OR WITHOUT PRESENCE OF ULCERS (sore throat, sores in mouth, or a toothache) UNUSUAL RASH, SWELLING OR PAIN  UNUSUAL VAGINAL DISCHARGE OR ITCHING   Items with * indicate a potential emergency and should be followed up as soon as possible or go to the Emergency Department if any problems should occur.  Please show the CHEMOTHERAPY ALERT CARD or  IMMUNOTHERAPY ALERT CARD at check-in to the Emergency Department and triage nurse.  Should you have questions after your visit or need to cancel or reschedule your appointment, please contact [facility] - A DEPT OF [facility]  Dept: [phone]  and follow the prompts.  Office hours are 8:00 a.m. to 4:30 p.m. Monday - Friday. Please note that voicemails left after 4:00 p.m. may not be returned until the following business day.  We are closed weekends and major holidays. You have access to a nurse at all times for urgent questions. Please call the main number to the clinic Dept: [phone] and follow the prompts.   For any non-urgent questions, you may also contact your provider using MyChart. We now offer e-Visits for anyone 81 and older to request care online for non-urgent symptoms. For details visit [email or web address].   Also download the MyChart app! Go to the app store, search "MyChart", open the app, select [facility], and log in with your MyChart username and password.

## 2024-03-29 NOTE — Progress Notes (Signed)
 [facility] at [facility] [address]  [place], [place] [address] [phone]   Interval Evaluation  Date of Service: 03/29/24 Patient Name: Randall Lewis Patient MRN: [id number] Patient DOB: August 03, 1955 Provider: Arthea MARLA Manns, MD  Identifying Statement:  Randall Lewis is a 68 y.o. male with right frontal glioblastoma    Oncologic History: Anaplastic oligodendroglioma of frontal lobe (CMS/HHS-HCC)  04/16/2005 Surgery  Craniotomy for resection of left frontal lesion. 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 Lost to follow up after 03/18/2010  05/02/2023 Presentation  Presentation to the [facility] at [facility]. Recommendations pending final pathology. Likely XRT/Temozolomide  as this is a high grade glioma. 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 Pathology reveals Infiltrating High Grade Glioma pending final molecular studies for integrated diagnosis.  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 (chromosomal microarray) Chromosome 10 loss with PTEN loss is detected (chromosomal microarray) 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  No further seizures since prior visit, he continues on Keppra  1000mg  twice per day.  No neurologic changes today.  He still reports recognizing faces in people that he doesn't know.  Otherwise his walking is independent.  No headaches or seizures.    H+P (05/19/23) Patient presents for follow up after craniotomy, resection of recurrent primary brain tumor at [facility] on 04/18/23.  He tolerated surgery well, no ill effects.  He is functionally intact, no further headaches, no seizures.  Recently increased blood pressure medication with his PCP.  Has visit with radiation oncology upcoming as well.  Medications: Current Outpatient Medications on File Prior to Visit  Medication Sig Dispense Refill   acetaminophen  (TYLENOL ) 325 MG tablet Take 975 mg by mouth every 6 (six) hours as needed.     amLODipine  (NORVASC ) 5 MG tablet Take 5 mg by mouth daily.     JARDIANCE 25 MG TABS tablet Take 25 mg by mouth daily.     levETIRAcetam  (KEPPRA ) 1000 MG tablet Take 1 tablet (1,000 mg total) by mouth 2 (two) times daily. 180 tablet 3   lomustine  (GLEOSTINE ) 100 MG capsule Take 2 capsules (200 mg total) by mouth once for 1 dose.  Take on an emtpy stomach 1 hour before or 2 hours after  meals. Caution:chemotherapy 2 capsule 0   lomustine  (GLEOSTINE ) 40 MG capsule Take 5 capsules (200 mg total) by mouth once for 1 dose. Take on an empty stomach 1 hour before or 2 hours after meals. (Patient not taking: Reported on 03/06/2024) 5 capsule 0   losartan -hydrochlorothiazide  (HYZAAR) 100-12.5 MG tablet Take 1 tablet by mouth daily.     metFORMIN (GLUCOPHAGE-XR) 500 MG 24 hr tablet Take 1,000 mg by mouth daily.     metoprolol  tartrate (LOPRESSOR ) 12.5 mg TABS tablet Take 12.5 mg by mouth daily.     oxyCODONE  (OXY IR/ROXICODONE ) 5 MG immediate release tablet Take 5 mg by mouth every 4 (four) hours as needed.     pioglitazone (ACTOS) 15 MG tablet Take 15 mg by mouth daily.     predniSONE  (DELTASONE ) 50 MG tablet Take 1 tablet at 13 hours, 7 hours, and 1 hour prior to IV contrast 9 tablet 0   simvastatin  (ZOCOR ) 20 MG tablet Take 20 mg by mouth daily.     No current facility-administered medications on file prior to visit.    Allergies:  Allergies  Allergen Reactions   Sildenafil Other (See Comments) and Nausea Only   Gadolinium Derivatives Nausea And Vomiting    Per pt , always get sick with gado and never offered anti nausea meds.   Past Medical History:  Past Medical History:  Diagnosis Date   Anxiety    BPH (benign prostatic hyperplasia)    Cancer (HCC) 05/31/2004   Brain tumor, in remission after chemo radiation and surgery   Diabetes mellitus without complication (HCC)    ED (erectile dysfunction)    Heart murmur    History of heart murmur   Hyperlipidemia    Hypertension    Sleep apnea    USES C-PAP   UTI (urinary tract infection)    Past Surgical History:  Past Surgical History:  Procedure Laterality Date   BRAIN SURGERY  2006   BRAIN TUMOR  - CANCER   COLONOSCOPY     CYSTOSCOPY N/A 07/29/2015   Procedure: FLEXIBLE CYSTOSCOPY;  Surgeon: Norleen Seltzer, MD;  Location: [facility];  Service: Urology;  Laterality: N/A;   FOOT BONE EXCISION  1989   PENILE PROSTHESIS  IMPLANT N/A 07/29/2015   Procedure: PENILE PROSTHESIS;  Surgeon: Norleen Seltzer, MD;  Location: [facility];  Service: Urology;  Laterality: N/A;   right knee meniscus repair  2010   THYROID  LOBECTOMY  05/12/2012   Procedure: THYROID  LOBECTOMY;  Surgeon: Krystal CHRISTELLA Spinner, MD;  Location: [facility];  Service: General;  Laterality: Left;  left thyroid  lobectomy   Social History:  Social History   Socioeconomic History   Marital status: Married    Spouse name: [name]   Number of children: 2   Years of education: Not on file   Highest education level: Master's degree (e.g., MA, MS, MEng, MEd, MSW, MBA)  Occupational History   Not on file  Tobacco Use   Smoking status: Former    Current packs/day: 0.00    Average packs/day: 1 pack/day for 23.0 years (23.0 ttl pk-yrs)    Types: Cigarettes    Start date: 04/26/1973    Quit date: 04/26/1996    Years since quitting: 27.9   Smokeless tobacco: Never  Vaping Use   Vaping status: Never Used  Substance and Sexual Activity   Alcohol use: No   Drug use: No   Sexual activity: Not  on file  Other Topics Concern   Not on file  Social History Narrative   Lives with wife   Right handed   Caffeine: 2 cups of coffee and 1 glass of tea/soda a day   Social Drivers of Health   Financial Resource Strain: Low Risk  (05/01/2023)   Received from [facility]   Overall Financial Resource Strain (CARDIA)    Difficulty of Paying Living Expenses: Not hard at all  Food Insecurity: Low Risk  (02/20/2024)   Received from [facility]   Hunger Vital Sign    Within the past 12 months, you worried that your food would run out before you got money to buy more: Never true    Within the past 12 months, the food you bought just didn't last and you didn't have money to get more. : Never true  Transportation Needs: No Transportation Needs (02/20/2024)   Received from [facility]    In the past 12 months, has lack of reliable transportation  kept you from medical appointments, meetings, work or from getting things needed for daily living? : No  Physical Activity: Not on file  Stress: Not on file  Social Connections: Unknown (01/16/2024)   Social Connection and Isolation Panel    Frequency of Communication with Friends and Family: More than three times a week    Frequency of Social Gatherings with Friends and Family: More than three times a week    Attends Religious Services: Not on file    Active Member of Clubs or Organizations: Yes    Attends Banker Meetings: More than 4 times per year    Marital Status: Married  Catering Manager Violence: Not At Risk (01/16/2024)   Humiliation, Afraid, Rape, and Kick questionnaire    Fear of Current or Ex-Partner: No    Emotionally Abused: No    Physically Abused: No    Sexually Abused: No   Family History:  Family History  Problem Relation Age of Onset   Breast cancer Mother    Cancer Brother 90   Heart attack Brother    Heart attack Brother    Birth defects Son    Colon cancer Neg Hx    Liver disease Neg Hx    Esophageal cancer Neg Hx     Review of Systems: Constitutional: Doesn't report fevers, chills or abnormal weight loss Eyes: Doesn't report blurriness of vision Ears, nose, mouth, throat, and face: Doesn't report sore throat Respiratory: Doesn't report cough, dyspnea or wheezes Cardiovascular: Doesn't report palpitation, chest discomfort  Gastrointestinal:  Doesn't report nausea, constipation, diarrhea GU: Doesn't report incontinence Skin: Doesn't report skin rashes Neurological: Per HPI Musculoskeletal: Doesn't report joint pain Behavioral/Psych: Doesn't report anxiety  Physical Exam: Vitals:   03/29/24 1225  BP: 120/78  Pulse: 63  Resp: 17  Temp: (!) 97.5 F (36.4 C)  SpO2: 99%    KPS: 70 General: Alert, cooperative, pleasant, in no acute distress Head: Normal EENT: No conjunctival injection or scleral icterus.  Lungs: Resp effort  normal Cardiac: Regular rate Abdomen: Non-distended abdomen Skin: No rashes cyanosis or petechiae. Extremities: No clubbing or edema  Neurologic Exam: Mental Status: Awake, alert, attentive to examiner. Oriented to self and environment. Language is fluent with intact comprehension.  Cranial Nerves: Visual acuity is grossly normal. Left hemianopia. Extra-ocular movements intact. No ptosis. Face is symmetric Motor: Tone and bulk are normal. Power is full in both arms and legs. Reflexes are symmetric, no pathologic  reflexes present.  Sensory: Intact to light touch Gait: Normal.   Labs: I have reviewed the data as listed    Component Value Date/Time   NA 137 03/15/2024 1017   K 3.8 03/15/2024 1017   CL 103 03/15/2024 1017   CO2 28 03/15/2024 1017   GLUCOSE 120 (H) 03/15/2024 1017   BUN 17 03/15/2024 1017   CREATININE 1.11 03/15/2024 1017   CREATININE 1.31 (H) 02/25/2012 1438   CALCIUM 9.8 03/15/2024 1017   PROT 7.1 03/15/2024 1017   ALBUMIN 4.2 03/15/2024 1017   AST 18 03/15/2024 1017   ALT 17 03/15/2024 1017   ALKPHOS 60 03/15/2024 1017   BILITOT 0.8 03/15/2024 1017   GFRNONAA >60 03/15/2024 1017   GFRNONAA >60 02/25/2012 1438   GFRAA >60 08/14/2019 1438   GFRAA >60 02/25/2012 1438   Lab Results  Component Value Date   WBC 8.1 03/29/2024   NEUTROABS 5.9 03/29/2024   HGB 17.8 (H) 03/29/2024   HCT 52.7 (H) 03/29/2024   MCV 78.3 (L) 03/29/2024   PLT 158 03/29/2024    Imaging:  [facility] Clinician Interpretation: I have personally reviewed the CNS images as listed.  My interpretation, in the context of the patient's clinical presentation, is stable disease  MR BRAIN W WO CONTRAST Result Date: 03/28/2024 EXAM: MRI Brain With and Without Contrast 03/28/2024 02:59:20 PM TECHNIQUE: Multiplanar multisequence MRI of the head/brain was performed with and without the administration of intravenous contrast. COMPARISON: MRI head 02/13/2024 CLINICAL HISTORY: Brain/CNS neoplasm, assess  treatment response, Glioblastoma, IDH-wildtype; 10 mL of gadavist  given; Pt confirmed prep taken for allergy. FINDINGS: BRAIN AND VENTRICLES: No substantial change in appearance of a necrotic lesion in the medial aspect of the right occipital and posterior temporal lobes. Similar involvement of the corpus callosum. Similar confluent restricted diffusion, ill-defined enhancement, and extent of T2/FLAIR signal abnormality. No new areas of enhancement or restricted diffusion. No progressive mass effect. Unchanged resection cavity with mild surrounding T2 hyperintensity in the anterior left frontal lobe without enhancement. No new infarct. No hydrocephalus. Normal flow voids. ORBITS: No acute abnormality. SINUSES: No acute abnormality. BONES AND SOFT TISSUES: Normal bone marrow signal and enhancement. No acute soft tissue abnormality. IMPRESSION: 1. No substantial change in infiltrating malignancy centered in the right occipital lobe, as detailed above. Electronically signed by: Gilmore Molt MD 03/28/2024 04:06 PM EDT RP Workstation: [id number]   EEG adult Result Date: 03/14/2024 Dohmeier, Dedra, MD     03/23/2024  4:16 PM [facility] EEG (ELECTROENCEPHALOGRAM) REPORT Lamar LITTIE Gosling ORDERING CLINICIAN: Dedra Gores, M.D. TECHNOLOGIST: , Burnard Plummer  REEGT TECHNIQUE:  This EEG study was done with scalp electrodes positioned according to the 10-20 International system of electrode placement. Electrical activity was reviewed with band pass filter of 1-70Hz , sensitivity of 7 uV/mm, display speed of 43mm/sec with a 60Hz  notched filter applied as appropriate. EEG data were recorded continuously and digitally stored.  Recoding duration : 26:48 Activation included:  Photic stimulation and Hyperventilation  . Description: The EEG's posterior dominant background rhythm of 8 hertz was seen . The amplitude was higher over the left hemisphere,displayed while the patient's eyes were closed  and  promptly attenuated with eye opening. At baseline, the recording showed a low amplitude asymmetric activity. Suspected  breech artefact over left fronto temporal area and  slow irregular activity with  phase reversal activity over the right central parietal  area. Photic stimulation was initiated at frequencies from 3- through 21 hertz, resulting in no additional changes ,  periodic or rhythmic discharges, or epileptiform activity. Hyperventilation maneuver was initiated,  leading to amplitude build-up and poorly formed activity, as well as further slowing over T 8/ P 8  . Sharp waves were noted at the same time over the left fronto temporal brain . Following hyperventilation the patient's EEG was reviewed for a period of 1 and 2 minutes post maneuver, and showed a dysrhythmia over the right brain, plus persistent  signs of  breech artefact over the left frontal and right parietal areas. The patient was recorded while awake, while drowsy. ECG: regular heart rate at  58-60 BPM. IMPRESSION:  This EEG is abnormal .  The right hemispheric discharges are potentially epileptiform. Medication for prevention of seizures is needed. Dr. Dedra Gores, M.D. Accredited by the ABPN, ABSM.    Assessment/Plan Glioblastoma of temporal lobe (HCC)  Lamar LITTIE Gosling is clinically stable today, now having completed cycle #2 CCNU/avastin .  MRI brain demonstrates stable findings, good response to therapy.  Patient elected to continue with cycle #3 oral CCNU 90mg /m2 q6 weeks and avastin  10mg /kg IV 2q weeks.  Avastin  will be helpful as concurrent therapy given burden of enhancing tumor and steroid requirement.  We reviewed side effects of CCNU, including fatigue, nausea vomiting, cytopenias, ILD.  We reviewed side effects of avastin , including hypertension, bleeding/clotting events, wound healing impairment.  The patient will have a complete blood count, a comprehensive metabolic panel, and urine protein performed prior to each  avastin  infusion. Labs may need to be performed more often. Zofran  will prescribed for home use for nausea/vomiting.    Will defer dosing of cycle #3 CCNU an additional two weeks per patient and physician preference.  Chemotherapy should be held for the following:  ANC less than 1,000  Platelets less than 100,000  LFT or creatinine greater than 2x ULN  If clinical concerns/contraindications develop   Avastin  should be held for the following:  ANC less than 500  Platelets less than 50,000  LFT or creatinine greater than 2x ULN  If clinical concerns/contraindications develop  Keppra  will con't 1000mg  BID.  We ask that Lamar LITTIE Gosling return to clinic in 2 weeks with labs prior to formal dosing of cycle #3 CCNU/avastin , or sooner as needed.    All questions were answered. The patient knows to call the clinic with any problems, questions or concerns. No barriers to learning were detected.  The total time spent in the encounter was 30 minutes and more than 50% was on counseling and review of test results   Arthea MARLA Manns, MD Medical Director of Neuro-Oncology [facility] 03/29/24 11:59 AM

## 2024-03-29 NOTE — Progress Notes (Signed)
 Per Dr Buckley, ok to treat with no urine protein today.

## 2024-03-30 ENCOUNTER — Inpatient Hospital Stay

## 2024-04-03 ENCOUNTER — Ambulatory Visit: Attending: Internal Medicine | Admitting: Occupational Therapy

## 2024-04-03 DIAGNOSIS — M6281 Muscle weakness (generalized): Secondary | ICD-10-CM | POA: Insufficient documentation

## 2024-04-03 DIAGNOSIS — R41842 Visuospatial deficit: Secondary | ICD-10-CM | POA: Diagnosis present

## 2024-04-03 DIAGNOSIS — R278 Other lack of coordination: Secondary | ICD-10-CM | POA: Diagnosis present

## 2024-04-03 DIAGNOSIS — R29898 Other symptoms and signs involving the musculoskeletal system: Secondary | ICD-10-CM | POA: Insufficient documentation

## 2024-04-03 DIAGNOSIS — R29818 Other symptoms and signs involving the nervous system: Secondary | ICD-10-CM | POA: Insufficient documentation

## 2024-04-03 NOTE — Therapy (Unsigned)
 OUTPATIENT OCCUPATIONAL THERAPY NEURO TREATMENT  Patient Name: Randall Lewis MRN: [id number] DOB:28-Oct-1955, 68 y.o., male Today's Date: 04/03/2024  PCP: Yolande Toribio MATSU, MD  REFERRING PROVIDER: Vaslow, Zachary K, MD  END OF SESSION:  OT End of Session - 04/03/24 1443     Visit Number 10    Number of Visits 13    Date for Recertification  04/10/24    Authorization Type Medicare A&B/[employer] 2025    Progress Note Due on Visit 10    OT Start Time 1444    OT Stop Time 1530    OT Time Calculation (min) 46 min    Equipment Utilized During Treatment Puzzles, Spot It    Activity Tolerance Patient tolerated treatment well    Behavior During Therapy WFL for tasks assessed/performed         Past Medical History:  Diagnosis Date   Anxiety    BPH (benign prostatic hyperplasia)    Cancer (HCC) 05/31/2004   Brain tumor, in remission after chemo radiation and surgery   Diabetes mellitus without complication (HCC)    ED (erectile dysfunction)    Heart murmur    History of heart murmur   Hyperlipidemia    Hypertension    Sleep apnea    USES C-PAP   UTI (urinary tract infection)    Past Surgical History:  Procedure Laterality Date   BRAIN SURGERY  2006   BRAIN TUMOR  - CANCER   COLONOSCOPY     CYSTOSCOPY N/A 07/29/2015   Procedure: FLEXIBLE CYSTOSCOPY;  Surgeon: Norleen Seltzer, MD;  Location: [facility];  Service: Urology;  Laterality: N/A;   FOOT BONE EXCISION  1989   PENILE PROSTHESIS IMPLANT N/A 07/29/2015   Procedure: PENILE PROSTHESIS;  Surgeon: Norleen Seltzer, MD;  Location: [facility];  Service: Urology;  Laterality: N/A;   right knee meniscus repair  2010   THYROID  LOBECTOMY  05/12/2012   Procedure: THYROID  LOBECTOMY;  Surgeon: Krystal CHRISTELLA Spinner, MD;  Location: [facility];  Service: General;  Laterality: Left;  left thyroid  lobectomy   Patient Active Problem List   Diagnosis Date Noted   Anemia 03/06/2024   Bladder outlet obstruction 03/06/2024   Nontoxic single thyroid  nodule  03/06/2024   Peripheral vascular disease 03/06/2024   Seizures (HCC) 01/16/2024   Depression 01/16/2024   Essential hypertension 01/16/2024   Diabetes mellitus type 2 in nonobese (HCC) 01/16/2024   Acute metabolic acidosis 01/16/2024   Obesity, Class I, BMI 30-34.9 01/16/2024   Hx of colonic polyps 10/18/2023   Abdominal pain 10/18/2023   Incomplete emptying of bladder 05/03/2023   Glioblastoma of temporal lobe (HCC) 04/18/2023   Benign prostatic hyperplasia with urinary retention 04/11/2023   Mixed hyperlipidemia 04/11/2023   Persistent hypersomnia 02/08/2022   Parasomnia associated with another health condition 03/20/2021   Non-restorative sleep 03/20/2021   Nightmares REM-sleep type 02/05/2021   Excessive daytime sleepiness 02/05/2021   OSA on CPAP 02/05/2021   Glioblastoma, IDH-wildtype (HCC) 02/05/2021   Disequilibrium 04/06/2019   History of malignant neoplasm of brain 03/23/2019   Neoplasm of brain (HCC) 03/23/2019   Lipoprotein deficiency disorder 11/21/2018   Encounter for screening for other disorder 11/11/2017   Pain of right shoulder region 07/05/2017   Adjustment disorder with depressed mood 11/01/2016   Secondary polycythemia 10/30/2015   Encounter for general adult medical examination without abnormal findings 10/23/2015   Erectile dysfunction 07/29/2015   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/27/2015   Paresthesia 09/25/2014   Neoplasm of uncertain behavior  of thyroid  gland, left lobe 04/26/2012   Retinal hemorrhage 12/29/2011   Encounter for other general examination 06/17/2010   Asymptomatic microscopic hematuria 06/23/2009   Right knee pain 03/24/2009   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   ONSET DATE: 02/02/2024 (Date of referral)  REFERRING DIAG: C71.9 (ICD-10-CM) - Glioblastoma, IDH-wildtype   THERAPY DIAG:  Other lack of coordination  Muscle weakness (generalized)  Visuospatial deficit  Other symptoms and signs involving the  nervous system  Rationale for Evaluation and Treatment: Rehabilitation  SUBJECTIVE:   SUBJECTIVE STATEMENT: Pt reports things have been going well.  He brought some hand grippers that he got for use at home.   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 Buckley was brought to the ER after patient was found to have left-sided weakness following which patient had seizure-like activity on the left side at around 9 PM yesterday at home.  Patient has been having headache for the last 24 hours and he tried some Tylenol  and also some leftover oxycodone  the headache eased up little bit.  Later in the evening around 9 PM when he was walking to the bathroom when he had a fall but did not hit his head or lose consciousness.  Patient's wife states he was with him she helped him to the bathroom and while on the commode he started developing left-sided weakness with seizure and EMS was called.  Patient's wife states that over the last 3 weeks he has been having some poor vision in the left eye.  PRECAUTIONS: Fall and Other: seizures   WEIGHT BEARING RESTRICTIONS: No  PAIN:  Are you having pain? No  FALLS: Has patient fallen in last 6 months? Yes. Number of falls too many to count, had 3 on 8/17 when he had his seizure that led to ED visit   LIVING ENVIRONMENT: Lives with: lives with their spouse (but she works during the  day) Lives in: House/apartment Stairs: Yes: Internal: 12 steps; on right going up and External: 2-4 steps; on right going up Has following equipment at home: None  PLOF: Independent; not driving; retired from doctor, hospital, comptroller, and Lt. Colonel from [employer]; enjoys general repairs  PATIENT GOALS: return to driving; avoid bumping into items  OBJECTIVE:  Note: Objective measures were completed at Evaluation unless otherwise noted.  HAND DOMINANCE: Right  ADLs: Overall ADLs: mod I  IADLs:  Meal Prep: mod I though bumps into things in the kitchen Medication management: mod I  Typing: issues with knowing where the keys are and has issues identifying emails  MOBILITY STATUS: Independent and Hx of falls  ACTIVITY TOLERANCE: Activity tolerance: good to fair  FUNCTIONAL OUTCOME MEASURES: PSFS: 5.7 total score   Total score = sum of the activity scores/number of activities Minimum detectable change (90%CI) for average score = 2 points Minimum detectable change (90%CI) for single activity score = 3 points   UPPER EXTREMITY ROM and MMT:    BUE: WNL  HAND FUNCTION: Grip  strength: Right: 98.1 lbs; Left: 65.6 lbs Grip strength: Right: 60.1, 65.0, 65.4 lbs; Left: 45.8, 48.5, 56.8  lbs Average: 63.5 lbs (R); 50.4 lbs (L)  COORDINATION: 9 Hole Peg test: Right: 34 sec; Left: 41 sec; with errors to L side  04/03/24; Right 32 sec Left 41  SENSATION: Paresthesias reported upon waking to UEs   EDEMA: none reported or observed  MUSCLE TONE: WFL  COGNITION: Overall cognitive status: Within functional limits for tasks assessed  VISION: Subjective report: Bumps into items on his L side Baseline vision: Wears glasses all the time Visual history: cataracts and brain injury  VISION ASSESSMENT: Impaired  Patient has difficulty with following activities due to following visual impairments: 30* of visual field on the left with inferior cut  PERCEPTION:  Impaired: See above  PRAXIS: WFL  OBSERVATIONS: Pt ambulates without use of AD. No loss of balance. The pt appears well kept and has glasses donned. At times demonstrated errors with manipulation of items in Left visual field.                                                                                                                           TODAY'S TREATMENT :  - Therapeutic activities completed for duration as noted below including:   Therapist reviewed goals with patient and updated patient progression with no significant improvement noted in 9 hole peg test score since eval ie) same time and still drops items or misses the bottom spot on the board at times without cues.  No additional functional limitations identified.  OTR help pt adjust his hand grippers with education on how to use the counters and instruction to space out exercised throughout the day.  Patient participated in therapeutic activities utilizing large and small puzzle tasks to address visual scanning, fine motor coordination, bimanual integration, and visual-perceptual reasoning skills. Activities were structured to promote improved attention and scanning toward the left visual field, as patient continues to demonstrate left-sided visual neglect/inattention.  During the activity, patient required frequent verbal and gestural cues to initiate scanning toward the left side of the table and puzzle area. Patient initially missed nearly all of the smaller puzzle pieces and several larger pieces located on the left side or lower edge of the workspace. Pt  encouraged to use visual cues ie) his hand, the puzzle box or something as a visual target progressively further into the left visual field to encourage compensatory head and eye movement.  Patient completed puzzle-related visual scanning tasks both in sitting and standing positions to challenge visual-motor integration, postural control, and environmental awareness across  different visual planes. While standing, patient demonstrated improved sustained attention and engagement, but continued to require cueing to locate and align pieces on the left side.  For fine motor and bimanual coordination, patient engaged in flipping over puzzle pieces, collecting multiple pieces in 1 hand and manipulating them for placement. Patient required moderate verbal and visual demonstration to facilitate simultaneous use of both  upper extremities, as he tended to alternate between right and left hands rather than coordinating them together. Noted difficulty in differentiating between edge and center pieces, frequently attempting to match non-edge pieces to the border despite visual cues and therapist modeling.  Patient was receptive to feedback and demonstrated improved awareness of left-sided space with repetition and guided cueing. Education provided regarding the therapeutic value of puzzles as a home carryover activity to improve visual scanning, fine motor skills, problem-solving, and visual attention. Patient expressed understanding and interest in incorporating this into his home routine.  PATIENT EDUCATION: Education details: Building Surveyor and FM tasks r/t puzzle Person educated: Patient Education method: Explanation, Demonstration, Tactile cues, and Verbal cues Education comprehension: verbalized understanding, returned demonstration, verbal cues required, tactile cues required, and needs further education  HOME EXERCISE PROGRAM: 03/01/2024: Randall Lewis putty exercises( access code;[email or web address]), golf solitaire 03/08/2024: Visual compensatory strategies, neck stretches 03/13/2024: Neck stretches HEP 03/20/2024: Word search, visual anchors (given red paper for home carryover)  03/29/2024: coordination activities, theraband exercises (same access code as listed above)  GOALS:  SHORT TERM GOALS: Target date: 03/27/2024   Patient will demonstrate initial L UE HEP with 25% verbal cues  or less for proper execution. Baseline: Goal status: MET  2.  Patient will demonstrate at least 75 lbs L grip strength as needed to open jars and other containers. Baseline: Right: 98.1 lbs; Left: 65.6 lbs Goal status: NOT MET- 03/27/24  3.  Patient will independently recall at least 2 compensatory strategies for visual impairment without cueing. Baseline:  Goal status: MET  LONG TERM GOALS: Target date: 04/10/2024   Patient will demonstrate updated L UE HEP with visual handouts only for proper execution. Baseline:  Goal status: IN PROGRESS  2.  Patient will report at least two-point increase in average PSFS score or at least three-point increase in a single activity score indicating functionally significant improvement given minimum detectable change.  Baseline: 5.7 total score (See above for individual activity scores) Goal status: IN PROGRESS  3.  Patient will demo improved FM coordination as evidenced by completing nine-hole peg with use of L in 36 seconds or less without errors in L visual field. Baseline: Right: 34 sec; Left: 41 sec Goal status: IN PROGRESS  04/03/24: No significant change: Right 32 sec Left 41 sec  ASSESSMENT:  CLINICAL IMPRESSION: Patient seen for occupational therapy s/p with L sided weakness and Visuospatial deficits secondary to glioblastoma. He demonstrates good engagement in HEP activities with ongoing cues for visual compensatory strategies. Pt will continue to benefit from skilled outpatient OT to improve L sided weakness and to address vision deficits for functional independence in ADLs and IADLs.   PERFORMANCE DEFICITS: in functional skills including ADLs, IADLs, coordination, sensation, strength, Fine motor control, endurance, decreased knowledge of precautions, decreased knowledge of use of DME, vision, and UE functional use.   IMPAIRMENTS: are limiting patient from ADLs, IADLs, rest and sleep, and leisure.   CO-MORBIDITIES: has co-morbidities  such as glioblastoma, DM, seizures that affects occupational performance. Patient will benefit from skilled OT to address above impairments and improve overall function.  OT OCCUPATIONAL PROFILE AND HISTORY: Detailed assessment: Review of records and additional review of physical, cognitive, psychosocial history related to current functional performance.   REHAB POTENTIAL: Fair given medical history   PLAN:  OT FREQUENCY: 2x/week  OT DURATION: 6 weeks  PLANNED INTERVENTIONS: 97168 OT Re-evaluation, 97535 self care/ADL training, 02889 therapeutic exercise, 97530 therapeutic activity, 97112 neuromuscular re-education, 97018 paraffin, 02960 fluidotherapy, 97010 moist  heat, [id number] Physical Performance Testing, functional mobility training, visual/perceptual remediation/compensation, coping strategies training, patient/family education, and DME and/or AE instructions  RECOMMENDED OTHER SERVICES: N/A for this visit  CONSULTED AND AGREED WITH PLAN OF CARE: Patient  PLAN FOR NEXT SESSION:  [employer] PSFS for DC Visual scanning activities Strengthening activities - UB bike  Clarita LITTIE Pride, OT 04/03/2024, 5:27 PM

## 2024-04-05 ENCOUNTER — Other Ambulatory Visit: Payer: Self-pay

## 2024-04-05 ENCOUNTER — Ambulatory Visit: Admitting: Occupational Therapy

## 2024-04-05 ENCOUNTER — Encounter: Payer: Self-pay | Admitting: Internal Medicine

## 2024-04-05 ENCOUNTER — Telehealth: Payer: Self-pay | Admitting: *Deleted

## 2024-04-05 ENCOUNTER — Other Ambulatory Visit: Payer: Self-pay | Admitting: Internal Medicine

## 2024-04-05 ENCOUNTER — Inpatient Hospital Stay

## 2024-04-05 ENCOUNTER — Inpatient Hospital Stay: Attending: Internal Medicine

## 2024-04-05 ENCOUNTER — Inpatient Hospital Stay: Admitting: Internal Medicine

## 2024-04-05 ENCOUNTER — Other Ambulatory Visit (HOSPITAL_COMMUNITY): Payer: Self-pay

## 2024-04-05 DIAGNOSIS — R41842 Visuospatial deficit: Secondary | ICD-10-CM

## 2024-04-05 DIAGNOSIS — R29818 Other symptoms and signs involving the nervous system: Secondary | ICD-10-CM

## 2024-04-05 DIAGNOSIS — C719 Malignant neoplasm of brain, unspecified: Secondary | ICD-10-CM

## 2024-04-05 DIAGNOSIS — R278 Other lack of coordination: Secondary | ICD-10-CM | POA: Diagnosis not present

## 2024-04-05 DIAGNOSIS — M6281 Muscle weakness (generalized): Secondary | ICD-10-CM

## 2024-04-05 DIAGNOSIS — R29898 Other symptoms and signs involving the musculoskeletal system: Secondary | ICD-10-CM

## 2024-04-05 MED ORDER — LOMUSTINE 100 MG PO CAPS
200.0000 mg | ORAL_CAPSULE | Freq: Once | ORAL | 0 refills | Status: DC
  Filled 2024-04-05: qty 2, 30d supply, fill #0

## 2024-04-05 NOTE — Telephone Encounter (Signed)
 Patient called to advise that he saw Dr Rosina today and they agreed to proceed with CCNU.  Per his last office note here Dr Buckley wanted to defer CCNU until next visit on 04/12/2024.  Patient also told him that he had some of the medication at home however, patient now reports that he does not but it was an empty bottle.  Wants to know if Dr Buckley wants to refill the CCNU now or wait until he is seen.

## 2024-04-05 NOTE — Telephone Encounter (Signed)
 Left message advising patient of this on his voicemail.

## 2024-04-05 NOTE — Therapy (Signed)
 OUTPATIENT OCCUPATIONAL THERAPY NEURO TREATMENT & DISCHARGE  Patient Name: Randall Lewis MRN: [id number] DOB:February 14, 1956, 68 y.o., male Today's Date: 04/05/2024  PCP: Randall Toribio MATSU, MD  REFERRING PROVIDER: Buckley Arthea POUR, MD  END OF SESSION:  OT End of Session - 04/05/24 1454     Visit Number 11    Number of Visits 13    Date for Recertification  04/10/24    Authorization Type Medicare A&B/[employer] 2025    Progress Note Due on Visit 10    OT Start Time 1453    OT Stop Time 1531    OT Time Calculation (min) 38 min    Equipment Utilized During Treatment --    Activity Tolerance Patient tolerated treatment well    Behavior During Therapy WFL for tasks assessed/performed         Past Medical History:  Diagnosis Date   Anxiety    BPH (benign prostatic hyperplasia)    Cancer (HCC) 05/31/2004   Brain tumor, in remission after chemo radiation and surgery   Diabetes mellitus without complication (HCC)    ED (erectile dysfunction)    Heart murmur    History of heart murmur   Hyperlipidemia    Hypertension    Sleep apnea    USES C-PAP   UTI (urinary tract infection)    Past Surgical History:  Procedure Laterality Date   BRAIN SURGERY  2006   BRAIN TUMOR  - CANCER   COLONOSCOPY     CYSTOSCOPY N/A 07/29/2015   Procedure: FLEXIBLE CYSTOSCOPY;  Surgeon: Randall Seltzer, MD;  Location: [facility];  Service: Urology;  Laterality: N/A;   FOOT BONE EXCISION  1989   PENILE PROSTHESIS IMPLANT N/A 07/29/2015   Procedure: PENILE PROSTHESIS;  Surgeon: Randall Seltzer, MD;  Location: [facility];  Service: Urology;  Laterality: N/A;   right knee meniscus repair  2010   THYROID  LOBECTOMY  05/12/2012   Procedure: THYROID  LOBECTOMY;  Surgeon: Randall CHRISTELLA Spinner, MD;  Location: [facility];  Service: General;  Laterality: Left;  left thyroid  lobectomy   Patient Active Problem List   Diagnosis Date Noted   Anemia 03/06/2024   Bladder outlet obstruction 03/06/2024   Nontoxic single thyroid  nodule  03/06/2024   Peripheral vascular disease 03/06/2024   Seizures (HCC) 01/16/2024   Depression 01/16/2024   Essential hypertension 01/16/2024   Diabetes mellitus type 2 in nonobese (HCC) 01/16/2024   Acute metabolic acidosis 01/16/2024   Obesity, Class I, BMI 30-34.9 01/16/2024   Hx of colonic polyps 10/18/2023   Abdominal pain 10/18/2023   Incomplete emptying of bladder 05/03/2023   Glioblastoma of temporal lobe (HCC) 04/18/2023   Benign prostatic hyperplasia with urinary retention 04/11/2023   Mixed hyperlipidemia 04/11/2023   Persistent hypersomnia 02/08/2022   Parasomnia associated with another health condition 03/20/2021   Non-restorative sleep 03/20/2021   Nightmares REM-sleep type 02/05/2021   Excessive daytime sleepiness 02/05/2021   OSA on CPAP 02/05/2021   Glioblastoma, IDH-wildtype (HCC) 02/05/2021   Disequilibrium 04/06/2019   History of malignant neoplasm of brain 03/23/2019   Neoplasm of brain (HCC) 03/23/2019   Lipoprotein deficiency disorder 11/21/2018   Encounter for screening for other disorder 11/11/2017   Pain of right shoulder region 07/05/2017   Adjustment disorder with depressed mood 11/01/2016   Secondary polycythemia 10/30/2015   Encounter for general adult medical examination without abnormal findings 10/23/2015   Erectile dysfunction 07/29/2015   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/27/2015   Paresthesia 09/25/2014   Neoplasm of uncertain behavior  of thyroid  gland, left lobe 04/26/2012   Retinal hemorrhage 12/29/2011   Encounter for other general examination 06/17/2010   Asymptomatic microscopic hematuria 06/23/2009   Right knee pain 03/24/2009   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   ONSET DATE: 02/02/2024 (Date of referral)  REFERRING DIAG: C71.9 (ICD-10-CM) - Glioblastoma, IDH-wildtype   THERAPY DIAG:  Other lack of coordination  Muscle weakness (generalized)  Visuospatial deficit  Other symptoms and signs involving the  nervous system  Other symptoms and signs involving the musculoskeletal system  Rationale for Evaluation and Treatment: Rehabilitation  SUBJECTIVE:   SUBJECTIVE STATEMENT: Pt reports he is to start another round of chemo next week. He is confident in completing HEP following OT d/c.  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 Buckley was brought to the ER after patient was found to have left-sided weakness following which patient had seizure-like activity on the left side at around 9 PM yesterday at home.  Patient has been having headache for the last 24 hours and he tried some Tylenol  and also some leftover oxycodone  the headache eased up little bit.  Later in the evening around 9 PM when he was walking to the bathroom when he had a fall but did not hit his head or lose consciousness.  Patient's wife states he was with him she helped him to the bathroom and while on the commode he started developing left-sided weakness with seizure and EMS was called.  Patient's wife states that over the last 3 weeks he has been having some poor vision in the left eye.  PRECAUTIONS: Fall and Other: seizures   WEIGHT BEARING RESTRICTIONS: No  PAIN:  Are you having pain? No  FALLS: Has patient fallen in last 6 months? Yes. 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  HAND DOMINANCE: Right  ADLs: Overall ADLs: mod I  IADLs:  Meal Prep: mod I though bumps into things in the kitchen Medication management: mod I  Typing: issues with knowing where the keys are and has issues identifying emails  MOBILITY STATUS: Independent and Hx of falls  ACTIVITY TOLERANCE: Activity tolerance: good to fair  FUNCTIONAL OUTCOME MEASURES: PSFS: 5.7 total score  04/05/2024: 8.7 total score   Total score = sum of the activity scores/number of activities Minimum detectable change (90%CI) for average score = 2 points Minimum detectable change (90%CI) for single activity score = 3  points   UPPER EXTREMITY ROM and MMT:    BUE: WNL  HAND FUNCTION: Grip strength: Right: 98.1 lbs; Left: 65.6 lbs Grip strength: Right: 60.1, 65.0, 65.4 lbs; Left: 45.8, 48.5, 56.8  lbs Average: 63.5 lbs (R); 50.4 lbs (L)  COORDINATION: 9 Hole Peg test: Right: 34 sec; Left: 41 sec; with errors to L side  04/03/24; Right 32 sec Left 41  SENSATION: Paresthesias reported upon waking to UEs   EDEMA: none reported or observed  MUSCLE TONE: WFL  COGNITION: Overall cognitive status: Within functional limits for tasks assessed  VISION: Subjective report: Bumps into items on his L side Baseline vision: Wears glasses all the time Visual history: cataracts and brain injury  VISION ASSESSMENT: Impaired  Patient has difficulty with  following activities due to following visual impairments: 30* of visual field on the left with inferior cut  PERCEPTION: Impaired: See above  PRAXIS: WFL  OBSERVATIONS: Pt ambulates without use of AD. No loss of balance. The pt appears well kept and has glasses donned. At times demonstrated errors with manipulation of items in Left visual field.                                                                                                                           TODAY'S TREATMENT :  - Therapeutic activities completed for duration as noted below including:   Therapist, daughter, and patient participated in 1 game of Skip-Bo requiring patient to shuffle, deal, hold cards, play cards onto table, and turn cards over with LUE, affected hand for improved fine motor coordination, gross motor coordination, upper extremity range of motion, reaction time, scanning and locating of items, processing, bimanual coordination/trunk control, sequencing of unfamiliar motor movements or tasks, motor planning, and item/pattern recognition  - Self-care/home management completed for duration as noted below including:  Objective measures assessed as noted in Goals section to determine progression towards goals.Therapist reviewed goals with patient and updated patient progression.  No additional functional limitations identified.  PATIENT EDUCATION: Education details: Skip-Bo; OT d/c Person educated: Patient and Child(ren) Education method: Explanation, Demonstration, Tactile cues, and Verbal cues Education comprehension: verbalized understanding, returned demonstration, verbal cues required, and tactile cues required  HOME EXERCISE PROGRAM: 03/01/2024: Landy putty exercises( access code;[email or web address]), golf solitaire 03/08/2024: Visual compensatory strategies, neck stretches 03/13/2024: Neck stretches HEP 03/20/2024: Word search, visual anchors (given red paper for home carryover)  03/29/2024: coordination  activities, theraband exercises (same access code as listed above)  GOALS:  SHORT TERM GOALS: Target date: 03/27/2024   Patient will demonstrate initial L UE HEP with 25% verbal cues or less for proper execution. Baseline: Goal status: MET  2.  Patient will demonstrate at least 75 lbs L grip strength as needed to open jars and other containers. Baseline: Right: 98.1 lbs; Left: 65.6 lbs Goal status: NOT MET- 03/27/24  3.  Patient will independently recall at least 2 compensatory strategies for visual impairment without cueing. Baseline:  Goal status: MET  LONG TERM GOALS: Target date: 04/10/2024   Patient will demonstrate updated L UE HEP with visual handouts only for proper execution. Baseline:  Goal status: NOT MET  2.  Patient will report at least two-point increase in average PSFS score or at least three-point increase in a single activity score indicating functionally significant improvement given minimum detectable change.  Baseline: 5.7 total score (See above for individual activity scores) 04/05/2024: 8.7 total score Goal status: MET  3.  Patient will demo improved FM coordination as evidenced by completing nine-hole peg with use of L in 36 seconds or less without errors in L visual field. Baseline: Right: 34 sec; Left: 41 sec Goal status: IN PROGRESS  04/03/24: No significant change: Right 32 sec Left 41 sec  ASSESSMENT:  CLINICAL IMPRESSION: Patient seen for occupational therapy s/p with L sided weakness and Visuospatial deficits secondary to glioblastoma. He has made progress towards all goals though has not fully met all goals at this time, this is likely due to the status of his condition. Patient is appropriate for discharge and no longer demonstrates medical necessity for continued skilled occupational therapy services at this time. Please re-consult OT as needed.   PLAN:  OT D/C Completed  CONSULTED AND AGREED WITH PLAN OF CARE: Patient  OCCUPATIONAL THERAPY  DISCHARGE SUMMARY  Visits from Start of Care: 11  Current functional level related to goals / functional outcomes: Patient has met 2/3 short-term goals and 1/3 long-term goals to date.   Remaining deficits: Pt has limitations associated with glioblastoma including processing, LUE strength, and LUE fine motor coordination. Despite this, pt has made improvements since evaluation. Will be undergoing another round of chemo starting next week.    Education / Equipment: Continue with current HEP following OT d/c. Obtain new order should functional status change.    Patient agrees to discharge. Patient goals were partially met. Patient is being discharged due to being pleased with the current functional level.SABRA Jocelyn CHRISTELLA Manny, OT 04/05/2024, 2:56 PM

## 2024-04-06 ENCOUNTER — Other Ambulatory Visit: Payer: Self-pay

## 2024-04-06 ENCOUNTER — Other Ambulatory Visit (HOSPITAL_COMMUNITY): Payer: Self-pay

## 2024-04-06 NOTE — Progress Notes (Signed)
 Specialty Pharmacy Refill Coordination Note  Randall Lewis is a 68 y.o. male contacted today regarding refills of specialty medication(s) Lomustine  (GLEOSTINE )   Patient requested [name] at [facility] at [place] date: 04/09/24   Medication will be filled on: 04/09/24

## 2024-04-09 ENCOUNTER — Other Ambulatory Visit: Payer: Self-pay

## 2024-04-10 ENCOUNTER — Other Ambulatory Visit: Payer: Self-pay

## 2024-04-11 ENCOUNTER — Other Ambulatory Visit (HOSPITAL_COMMUNITY): Payer: Self-pay

## 2024-04-11 ENCOUNTER — Other Ambulatory Visit: Payer: Self-pay

## 2024-04-11 DIAGNOSIS — C719 Malignant neoplasm of brain, unspecified: Secondary | ICD-10-CM

## 2024-04-12 ENCOUNTER — Encounter: Payer: Self-pay | Admitting: Internal Medicine

## 2024-04-12 ENCOUNTER — Inpatient Hospital Stay: Attending: Internal Medicine

## 2024-04-12 ENCOUNTER — Inpatient Hospital Stay

## 2024-04-12 ENCOUNTER — Inpatient Hospital Stay: Admitting: Internal Medicine

## 2024-04-12 VITALS — BP 133/77 | HR 55 | Temp 97.4°F | Resp 16 | Ht 71.0 in | Wt 214.0 lb

## 2024-04-12 DIAGNOSIS — C719 Malignant neoplasm of brain, unspecified: Secondary | ICD-10-CM | POA: Diagnosis not present

## 2024-04-12 DIAGNOSIS — Z7984 Long term (current) use of oral hypoglycemic drugs: Secondary | ICD-10-CM | POA: Insufficient documentation

## 2024-04-12 DIAGNOSIS — Z79899 Other long term (current) drug therapy: Secondary | ICD-10-CM | POA: Diagnosis not present

## 2024-04-12 DIAGNOSIS — R569 Unspecified convulsions: Secondary | ICD-10-CM | POA: Diagnosis not present

## 2024-04-12 DIAGNOSIS — Z87440 Personal history of urinary (tract) infections: Secondary | ICD-10-CM | POA: Insufficient documentation

## 2024-04-12 DIAGNOSIS — D709 Neutropenia, unspecified: Secondary | ICD-10-CM | POA: Diagnosis not present

## 2024-04-12 DIAGNOSIS — E119 Type 2 diabetes mellitus without complications: Secondary | ICD-10-CM | POA: Insufficient documentation

## 2024-04-12 DIAGNOSIS — Z803 Family history of malignant neoplasm of breast: Secondary | ICD-10-CM | POA: Insufficient documentation

## 2024-04-12 DIAGNOSIS — E785 Hyperlipidemia, unspecified: Secondary | ICD-10-CM | POA: Diagnosis not present

## 2024-04-12 DIAGNOSIS — Z809 Family history of malignant neoplasm, unspecified: Secondary | ICD-10-CM | POA: Insufficient documentation

## 2024-04-12 DIAGNOSIS — Z87891 Personal history of nicotine dependence: Secondary | ICD-10-CM | POA: Diagnosis not present

## 2024-04-12 DIAGNOSIS — I10 Essential (primary) hypertension: Secondary | ICD-10-CM | POA: Insufficient documentation

## 2024-04-12 DIAGNOSIS — G4730 Sleep apnea, unspecified: Secondary | ICD-10-CM | POA: Insufficient documentation

## 2024-04-12 DIAGNOSIS — Z5112 Encounter for antineoplastic immunotherapy: Secondary | ICD-10-CM | POA: Diagnosis present

## 2024-04-12 DIAGNOSIS — N400 Enlarged prostate without lower urinary tract symptoms: Secondary | ICD-10-CM | POA: Insufficient documentation

## 2024-04-12 DIAGNOSIS — Z923 Personal history of irradiation: Secondary | ICD-10-CM | POA: Insufficient documentation

## 2024-04-12 DIAGNOSIS — C711 Malignant neoplasm of frontal lobe: Secondary | ICD-10-CM | POA: Insufficient documentation

## 2024-04-12 LAB — CBC WITH DIFFERENTIAL (CANCER CENTER ONLY)
Abs Immature Granulocytes: 0 K/uL (ref 0.00–0.07)
Basophils Absolute: 0 K/uL (ref 0.0–0.1)
Basophils Relative: 0 %
Eosinophils Absolute: 0 K/uL (ref 0.0–0.5)
Eosinophils Relative: 0 %
HCT: 48.6 % (ref 39.0–52.0)
Hemoglobin: 16.3 g/dL (ref 13.0–17.0)
Immature Granulocytes: 0 %
Lymphocytes Relative: 32 %
Lymphs Abs: 0.8 K/uL (ref 0.7–4.0)
MCH: 26.9 pg (ref 26.0–34.0)
MCHC: 33.5 g/dL (ref 30.0–36.0)
MCV: 80.3 fL (ref 80.0–100.0)
Monocytes Absolute: 0.2 K/uL (ref 0.1–1.0)
Monocytes Relative: 9 %
Neutro Abs: 1.5 K/uL — ABNORMAL LOW (ref 1.7–7.7)
Neutrophils Relative %: 59 %
Platelet Count: 143 K/uL — ABNORMAL LOW (ref 150–400)
RBC: 6.05 MIL/uL — ABNORMAL HIGH (ref 4.22–5.81)
RDW: 16.9 % — ABNORMAL HIGH (ref 11.5–15.5)
WBC Count: 2.6 K/uL — ABNORMAL LOW (ref 4.0–10.5)
nRBC: 0 % (ref 0.0–0.2)

## 2024-04-12 LAB — TOTAL PROTEIN, URINE DIPSTICK: Protein, ur: NEGATIVE mg/dL

## 2024-04-12 LAB — CMP (CANCER CENTER ONLY)
ALT: 26 U/L (ref 0–44)
AST: 19 U/L (ref 15–41)
Albumin: 3.9 g/dL (ref 3.5–5.0)
Alkaline Phosphatase: 58 U/L (ref 38–126)
Anion gap: 5 (ref 5–15)
BUN: 12 mg/dL (ref 8–23)
CO2: 28 mmol/L (ref 22–32)
Calcium: 9.2 mg/dL (ref 8.9–10.3)
Chloride: 105 mmol/L (ref 98–111)
Creatinine: 1 mg/dL (ref 0.61–1.24)
GFR, Estimated: >60 mL/min (ref >60)
Glucose, Bld: 106 mg/dL — ABNORMAL HIGH (ref 70–99)
Potassium: 4.7 mmol/L (ref 3.5–5.1)
Sodium: 138 mmol/L (ref 135–145)
Total Bilirubin: 1 mg/dL (ref 0.0–1.2)
Total Protein: 6.7 g/dL (ref 6.5–8.1)

## 2024-04-12 MED ORDER — SODIUM CHLORIDE 0.9 % IV SOLN: INTRAVENOUS | Status: DC

## 2024-04-12 MED ORDER — SODIUM CHLORIDE 0.9 % IV SOLN
10.0000 mg/kg | Freq: Once | INTRAVENOUS | Status: AC
  Administered 2024-04-12: 1000 mg via INTRAVENOUS
  Filled 2024-04-12: qty 32

## 2024-04-12 NOTE — Patient Instructions (Signed)
 [facility] - A DEPT OF [facility]  Discharge Instructions: Thank you for choosing [facility] to provide your oncology and hematology care.   If you have a lab appointment with the Cancer Center, please go directly to the Cancer Center and check in at the registration area.   Wear comfortable clothing and clothing appropriate for easy access to any Portacath or PICC line.   We strive to give you quality time with your provider. You may need to reschedule your appointment if you arrive late (15 or more minutes).  Arriving late affects you and other patients whose appointments are after yours.  Also, if you miss three or more appointments without notifying the office, you may be dismissed from the clinic at the provider's discretion.      For prescription refill requests, have your pharmacy contact our office and allow 72 hours for refills to be completed.    Today you received the following chemotherapy and/or immunotherapy agents: bevacizumab -adcd (VEGZELMA )      To help prevent nausea and vomiting after your treatment, we encourage you to take your nausea medication as directed.  BELOW ARE SYMPTOMS THAT SHOULD BE REPORTED IMMEDIATELY: *FEVER GREATER THAN 100.4 F (38 C) OR HIGHER *CHILLS OR SWEATING *NAUSEA AND VOMITING THAT IS NOT CONTROLLED WITH YOUR NAUSEA MEDICATION *UNUSUAL SHORTNESS OF BREATH *UNUSUAL BRUISING OR BLEEDING *URINARY PROBLEMS (pain or burning when urinating, or frequent urination) *BOWEL PROBLEMS (unusual diarrhea, constipation, pain near the anus) TENDERNESS IN MOUTH AND THROAT WITH OR WITHOUT PRESENCE OF ULCERS (sore throat, sores in mouth, or a toothache) UNUSUAL RASH, SWELLING OR PAIN  UNUSUAL VAGINAL DISCHARGE OR ITCHING   Items with * indicate a potential emergency and should be followed up as soon as possible or go to the Emergency Department if any problems should occur.  Please show the CHEMOTHERAPY ALERT CARD  or IMMUNOTHERAPY ALERT CARD at check-in to the Emergency Department and triage nurse.  Should you have questions after your visit or need to cancel or reschedule your appointment, please contact [facility] - A DEPT OF [facility]  Dept: [phone]  and follow the prompts.  Office hours are 8:00 a.m. to 4:30 p.m. Monday - Friday. Please note that voicemails left after 4:00 p.m. may not be returned until the following business day.  We are closed weekends and major holidays. You have access to a nurse at all times for urgent questions. Please call the main number to the clinic Dept: [phone] and follow the prompts.   For any non-urgent questions, you may also contact your provider using MyChart. We now offer e-Visits for anyone 48 and older to request care online for non-urgent symptoms. For details visit [email or web address].   Also download the MyChart app! Go to the app store, search MyChart, open the app, select [facility], and log in with your MyChart username and password.

## 2024-04-12 NOTE — Progress Notes (Signed)
 [facility] [address]  [place], [place] [address] [phone]   Interval Evaluation  Date of Service: 04/12/24 Patient Name: Randall Lewis Patient MRN: [id number] Patient DOB: 05-05-1956 Provider: Arthea MARLA Manns, MD  Identifying Statement:  Randall Lewis is a 68 y.o. male with right frontal glioblastoma    Oncologic History: Anaplastic oligodendroglioma of frontal lobe (CMS/HHS-HCC)  04/16/2005 Surgery  Craniotomy for resection of left frontal lesion. 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 Lost to follow up after 03/18/2010  05/02/2023 Presentation  Presentation to the [facility]. Recommendations pending final pathology. Likely XRT/Temozolomide  as this is a high grade glioma. 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 Pathology reveals Infiltrating High Grade Glioma pending final molecular studies for integrated diagnosis.  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 (chromosomal microarray) Chromosome 10 loss with PTEN loss is detected (chromosomal microarray) 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  He had good visit at [facility] with Dr. Rosina.  No further seizures since prior visit, he continues on Keppra  1000mg  twice per day.  No neurologic changes today.  He still reports recognizing faces in people that he doesn't know.  Otherwise his walking is independent.  No headaches or seizures.    H+P (05/19/23) Patient presents for follow up after craniotomy, resection of recurrent primary brain tumor at [facility] on 04/18/23.  He tolerated surgery well, no ill effects.  He is functionally intact, no further headaches, no seizures.  Recently increased blood pressure medication with his PCP.  Has visit with radiation oncology upcoming as well.  Medications: Current Outpatient Medications on File Prior to Visit  Medication Sig Dispense Refill   acetaminophen  (TYLENOL ) 325 MG tablet Take 975 mg by mouth every 6 (six) hours as needed.     amLODipine  (NORVASC ) 5 MG tablet Take 5 mg by mouth daily.     JARDIANCE 25 MG TABS tablet Take 25 mg by mouth daily.     levETIRAcetam  (KEPPRA ) 1000 MG tablet Take 1 tablet (1,000 mg total) by mouth 2 (two) times daily. 180 tablet 3   lomustine  (GLEOSTINE ) 100 MG capsule Take 2 capsules (200 mg  total) by mouth once for 1 dose. Take on an emtpy stomach 1  hour before or 2 hours after meals. Caution:chemotherapy 2 capsule 0   lomustine  (GLEOSTINE ) 100 MG capsule Take 2 capsules (200 mg total) by mouth once for 1 dose. Take on an emtpy stomach 1 hour before or 2 hours after meals. Caution:chemotherapy 2 capsule 0   lomustine  (GLEOSTINE ) 40 MG capsule Take 5 capsules (200 mg total) by mouth once for 1 dose. Take on an empty stomach 1 hour before or 2 hours after meals. 5 capsule 0   losartan -hydrochlorothiazide  (HYZAAR) 100-12.5 MG tablet Take 1 tablet by mouth daily.     metFORMIN (GLUCOPHAGE-XR) 500 MG 24 hr tablet Take 1,000 mg by mouth daily.     metoprolol  tartrate (LOPRESSOR ) 12.5 mg TABS tablet Take 12.5 mg by mouth daily.     oxyCODONE  (OXY IR/ROXICODONE ) 5 MG immediate release tablet Take 5 mg by mouth every 4 (four) hours as needed.     pioglitazone (ACTOS) 15 MG tablet Take 15 mg by mouth daily.     predniSONE  (DELTASONE ) 50 MG tablet Take 1 tablet at 13 hours, 7 hours, and 1 hour prior to IV contrast 9 tablet 0   simvastatin  (ZOCOR ) 20 MG tablet Take 20 mg by mouth daily.     No current facility-administered medications on file prior to visit.    Allergies:  Allergies  Allergen Reactions   Sildenafil Other (See Comments) and Nausea Only   Gadolinium Derivatives Nausea And Vomiting    Per pt , always get sick with gado and never offered anti nausea meds.   Past Medical History:  Past Medical History:  Diagnosis Date   Anxiety    BPH (benign prostatic hyperplasia)    Cancer (HCC) 05/31/2004   Brain tumor, in remission after chemo radiation and surgery   Diabetes mellitus without complication (HCC)    ED (erectile dysfunction)    Heart murmur    History of heart murmur   Hyperlipidemia    Hypertension    Sleep apnea    USES C-PAP   UTI (urinary tract infection)    Past Surgical History:  Past Surgical History:  Procedure Laterality Date   BRAIN SURGERY  2006   BRAIN TUMOR  - CANCER   COLONOSCOPY     CYSTOSCOPY N/A  07/29/2015   Procedure: FLEXIBLE CYSTOSCOPY;  Surgeon: Norleen Seltzer, MD;  Location: [facility];  Service: Urology;  Laterality: N/A;   FOOT BONE EXCISION  1989   PENILE PROSTHESIS IMPLANT N/A 07/29/2015   Procedure: PENILE PROSTHESIS;  Surgeon: Norleen Seltzer, MD;  Location: [facility];  Service: Urology;  Laterality: N/A;   right knee meniscus repair  2010   THYROID  LOBECTOMY  05/12/2012   Procedure: THYROID  LOBECTOMY;  Surgeon: Krystal CHRISTELLA Spinner, MD;  Location: [facility];  Service: General;  Laterality: Left;  left thyroid  lobectomy   Social History:  Social History   Socioeconomic History   Marital status: Married    Spouse name: [name]   Number of children: 2   Years of education: Not on file   Highest education level: Master's degree (e.g., MA, MS, MEng, MEd, MSW, MBA)  Occupational History   Not on file  Tobacco Use   Smoking status: Former    Current packs/day: 0.00    Average packs/day: 1 pack/day for 23.0 years (23.0 ttl pk-yrs)    Types: Cigarettes    Start date: 04/26/1973    Quit date: 04/26/1996    Years  since quitting: 27.9   Smokeless tobacco: Never  Vaping Use   Vaping status: Never Used  Substance and Sexual Activity   Alcohol use: No   Drug use: No   Sexual activity: Not on file  Other Topics Concern   Not on file  Social History Narrative   Lives with wife   Right handed   Caffeine: 2 cups of coffee and 1 glass of tea/soda a day   Social Drivers of Corporate Investment Banker Strain: Low Risk  (05/01/2023)   Received from [facility]   Overall Financial Resource Strain (CARDIA)    Difficulty of Paying Living Expenses: Not hard at all  Food Insecurity: Low Risk  (02/20/2024)   Received from [facility]   Hunger Vital Sign    Within the past 12 months, you worried that your food would run out before you got money to buy more: Never true    Within the past 12 months, the food you bought just didn't last and you didn't have money to get more. : Never  true  Transportation Needs: No Transportation Needs (02/20/2024)   Received from [facility]    In the past 12 months, has lack of reliable transportation kept you from medical appointments, meetings, work or from getting things needed for daily living? : No  Physical Activity: Not on file  Stress: Not on file  Social Connections: Unknown (01/16/2024)   Social Connection and Isolation Panel    Frequency of Communication with Friends and Family: More than three times a week    Frequency of Social Gatherings with Friends and Family: More than three times a week    Attends Religious Services: Not on file    Active Member of Clubs or Organizations: Yes    Attends Banker Meetings: More than 4 times per year    Marital Status: Married  Catering Manager Violence: Not At Risk (01/16/2024)   Humiliation, Afraid, Rape, and Kick questionnaire    Fear of Current or Ex-Partner: No    Emotionally Abused: No    Physically Abused: No    Sexually Abused: No   Family History:  Family History  Problem Relation Age of Onset   Breast cancer Mother    Cancer Brother 75   Heart attack Brother    Heart attack Brother    Birth defects Son    Colon cancer Neg Hx    Liver disease Neg Hx    Esophageal cancer Neg Hx     Review of Systems: Constitutional: Doesn't report fevers, chills or abnormal weight loss Eyes: Doesn't report blurriness of vision Ears, nose, mouth, throat, and face: Doesn't report sore throat Respiratory: Doesn't report cough, dyspnea or wheezes Cardiovascular: Doesn't report palpitation, chest discomfort  Gastrointestinal:  Doesn't report nausea, constipation, diarrhea GU: Doesn't report incontinence Skin: Doesn't report skin rashes Neurological: Per HPI Musculoskeletal: Doesn't report joint pain Behavioral/Psych: Doesn't report anxiety  Physical Exam: Vitals:   04/12/24 1010 04/12/24 1016  BP: (!) 140/89 133/77  Pulse: (!) 55   Resp: 16    Temp: (!) 97.4 F (36.3 C)   SpO2: 99%      KPS: 70 General: Alert, cooperative, pleasant, in no acute distress Head: Normal EENT: No conjunctival injection or scleral icterus.  Lungs: Resp effort normal Cardiac: Regular rate Abdomen: Non-distended abdomen Skin: No rashes cyanosis or petechiae. Extremities: No clubbing or edema  Neurologic Exam: Mental Status: Awake, alert, attentive to examiner. Oriented  to self and environment. Language is fluent with intact comprehension.  Cranial Nerves: Visual acuity is grossly normal. Left hemianopia. Extra-ocular movements intact. No ptosis. Face is symmetric Motor: Tone and bulk are normal. Power is full in both arms and legs. Reflexes are symmetric, no pathologic reflexes present.  Sensory: Intact to light touch Gait: Normal.   Labs: I have reviewed the data as listed    Component Value Date/Time   NA 139 03/29/2024 1135   K 3.8 03/29/2024 1135   CL 101 03/29/2024 1135   CO2 29 03/29/2024 1135   GLUCOSE 73 03/29/2024 1135   BUN 23 03/29/2024 1135   CREATININE 1.22 03/29/2024 1135   CREATININE 1.31 (H) 02/25/2012 1438   CALCIUM 10.0 03/29/2024 1135   PROT 7.6 03/29/2024 1135   ALBUMIN 4.6 03/29/2024 1135   AST 14 (L) 03/29/2024 1135   ALT 13 03/29/2024 1135   ALKPHOS 53 03/29/2024 1135   BILITOT 0.8 03/29/2024 1135   GFRNONAA >60 03/29/2024 1135   GFRNONAA >60 02/25/2012 1438   GFRAA >60 08/14/2019 1438   GFRAA >60 02/25/2012 1438   Lab Results  Component Value Date   WBC 2.6 (L) 04/12/2024   NEUTROABS 1.5 (L) 04/12/2024   HGB 16.3 04/12/2024   HCT 48.6 04/12/2024   MCV 80.3 04/12/2024   PLT 143 (L) 04/12/2024    Assessment/Plan Glioblastoma, IDH-wildtype (HCC)  Seizures (HCC)  Randall Lewis is clinically stable today, now having dosed cycle #3 CCNU/avastin  on 04/09/24.  Labs today demonstrate modest neutropenia (1.5) and platelets decreased to 143.  Patient elected to continue with cycle #3 oral CCNU  90mg /m2 q6 weeks and avastin  10mg /kg IV 2q weeks.  Avastin  will be helpful as concurrent therapy given burden of enhancing tumor and steroid requirement.  We reviewed side effects of CCNU, including fatigue, nausea vomiting, cytopenias, ILD.  We reviewed side effects of avastin , including hypertension, bleeding/clotting events, wound healing impairment.  The patient will have a complete blood count, a comprehensive metabolic panel, and urine protein performed prior to each avastin  infusion. Labs may need to be performed more often. Zofran  will prescribed for home use for nausea/vomiting.    Chemotherapy should be held for the following:  ANC less than 1,000  Platelets less than 100,000  LFT or creatinine greater than 2x ULN  If clinical concerns/contraindications develop   Avastin  should be held for the following:  ANC less than 500  Platelets less than 50,000  LFT or creatinine greater than 2x ULN  If clinical concerns/contraindications develop  Keppra  will con't 1000mg  BID.  We ask that Randall Lewis return to clinic in 2 weeks with labs prior to cycle #3, day 18 CCNU/avastin , or sooner as needed.    All questions were answered. The patient knows to call the clinic with any problems, questions or concerns. No barriers to learning were detected.  The total time spent in the encounter was 30 minutes and more than 50% was on counseling and review of test results   Arthea MARLA Manns, MD Medical Director of Neuro-Oncology [facility] 04/12/24 10:08 AM

## 2024-04-13 ENCOUNTER — Inpatient Hospital Stay

## 2024-04-14 ENCOUNTER — Other Ambulatory Visit (HOSPITAL_COMMUNITY): Payer: Self-pay

## 2024-04-16 ENCOUNTER — Other Ambulatory Visit: Payer: Self-pay

## 2024-04-17 ENCOUNTER — Other Ambulatory Visit: Payer: Self-pay

## 2024-04-17 ENCOUNTER — Encounter: Payer: Self-pay | Admitting: Internal Medicine

## 2024-04-17 ENCOUNTER — Other Ambulatory Visit (HOSPITAL_COMMUNITY): Payer: Self-pay

## 2024-04-17 NOTE — Progress Notes (Signed)
 Specialty Pharmacy Ongoing Clinical Assessment Note  Randall Lewis is a 68 y.o. male who is being followed by the specialty pharmacy service for RxSp Oncology   Patient's specialty medication(s) reviewed today: Lomustine  (GLEOSTINE )   Missed doses in the last 4 weeks: 0   Patient/Caregiver did not have any additional questions or concerns.   Therapeutic benefit summary: Patient is achieving benefit   Adverse events/side effects summary: No adverse events/side effects   Patient's therapy is appropriate to: Continue    Goals Addressed             This Visit's Progress    Slow Disease Progression   On track    Patient is on track. Patient will maintain adherence. Per visit on 11/13, patient is clinically stable         Follow up: 3 months  [facility] Specialty Pharmacist

## 2024-04-19 ENCOUNTER — Inpatient Hospital Stay

## 2024-04-19 ENCOUNTER — Other Ambulatory Visit: Payer: Self-pay | Admitting: Internal Medicine

## 2024-04-19 ENCOUNTER — Encounter: Payer: Self-pay | Admitting: Internal Medicine

## 2024-04-19 ENCOUNTER — Inpatient Hospital Stay: Admitting: Internal Medicine

## 2024-05-03 ENCOUNTER — Inpatient Hospital Stay

## 2024-05-03 ENCOUNTER — Inpatient Hospital Stay: Attending: Internal Medicine

## 2024-05-03 ENCOUNTER — Inpatient Hospital Stay: Admitting: Internal Medicine

## 2024-05-03 ENCOUNTER — Inpatient Hospital Stay: Admitting: Dietician

## 2024-05-03 VITALS — BP 119/83 | HR 74 | Temp 97.9°F | Resp 18 | Ht 71.0 in | Wt 207.9 lb

## 2024-05-03 DIAGNOSIS — Z803 Family history of malignant neoplasm of breast: Secondary | ICD-10-CM | POA: Diagnosis not present

## 2024-05-03 DIAGNOSIS — I10 Essential (primary) hypertension: Secondary | ICD-10-CM | POA: Insufficient documentation

## 2024-05-03 DIAGNOSIS — Z79899 Other long term (current) drug therapy: Secondary | ICD-10-CM | POA: Diagnosis not present

## 2024-05-03 DIAGNOSIS — G4730 Sleep apnea, unspecified: Secondary | ICD-10-CM | POA: Insufficient documentation

## 2024-05-03 DIAGNOSIS — R569 Unspecified convulsions: Secondary | ICD-10-CM | POA: Diagnosis not present

## 2024-05-03 DIAGNOSIS — C711 Malignant neoplasm of frontal lobe: Secondary | ICD-10-CM | POA: Diagnosis not present

## 2024-05-03 DIAGNOSIS — E119 Type 2 diabetes mellitus without complications: Secondary | ICD-10-CM | POA: Diagnosis not present

## 2024-05-03 DIAGNOSIS — E785 Hyperlipidemia, unspecified: Secondary | ICD-10-CM | POA: Diagnosis not present

## 2024-05-03 DIAGNOSIS — Z87442 Personal history of urinary calculi: Secondary | ICD-10-CM | POA: Diagnosis not present

## 2024-05-03 DIAGNOSIS — Z923 Personal history of irradiation: Secondary | ICD-10-CM | POA: Diagnosis not present

## 2024-05-03 DIAGNOSIS — C719 Malignant neoplasm of brain, unspecified: Secondary | ICD-10-CM

## 2024-05-03 DIAGNOSIS — N400 Enlarged prostate without lower urinary tract symptoms: Secondary | ICD-10-CM | POA: Diagnosis not present

## 2024-05-03 DIAGNOSIS — D696 Thrombocytopenia, unspecified: Secondary | ICD-10-CM | POA: Diagnosis not present

## 2024-05-03 DIAGNOSIS — D709 Neutropenia, unspecified: Secondary | ICD-10-CM | POA: Diagnosis not present

## 2024-05-03 DIAGNOSIS — K5900 Constipation, unspecified: Secondary | ICD-10-CM | POA: Insufficient documentation

## 2024-05-03 DIAGNOSIS — Z87891 Personal history of nicotine dependence: Secondary | ICD-10-CM | POA: Insufficient documentation

## 2024-05-03 DIAGNOSIS — Z7984 Long term (current) use of oral hypoglycemic drugs: Secondary | ICD-10-CM | POA: Diagnosis not present

## 2024-05-03 DIAGNOSIS — Z5112 Encounter for antineoplastic immunotherapy: Secondary | ICD-10-CM | POA: Insufficient documentation

## 2024-05-03 LAB — CBC WITH DIFFERENTIAL (CANCER CENTER ONLY)
Abs Immature Granulocytes: 0.01 K/uL (ref 0.00–0.07)
Basophils Absolute: 0 K/uL (ref 0.0–0.1)
Basophils Relative: 0 %
Eosinophils Absolute: 0 K/uL (ref 0.0–0.5)
Eosinophils Relative: 1 %
HCT: 54.8 % — ABNORMAL HIGH (ref 39.0–52.0)
Hemoglobin: 18 g/dL — ABNORMAL HIGH (ref 13.0–17.0)
Immature Granulocytes: 0 %
Lymphocytes Relative: 26 %
Lymphs Abs: 1.2 K/uL (ref 0.7–4.0)
MCH: 26.8 pg (ref 26.0–34.0)
MCHC: 32.8 g/dL (ref 30.0–36.0)
MCV: 81.5 fL (ref 80.0–100.0)
Monocytes Absolute: 0.5 K/uL (ref 0.1–1.0)
Monocytes Relative: 10 %
Neutro Abs: 2.9 K/uL (ref 1.7–7.7)
Neutrophils Relative %: 63 %
Platelet Count: 145 K/uL — ABNORMAL LOW (ref 150–400)
RBC: 6.72 MIL/uL — ABNORMAL HIGH (ref 4.22–5.81)
RDW: 17.9 % — ABNORMAL HIGH (ref 11.5–15.5)
WBC Count: 4.6 K/uL (ref 4.0–10.5)
nRBC: 0 % (ref 0.0–0.2)

## 2024-05-03 LAB — CMP (CANCER CENTER ONLY)
ALT: 14 U/L (ref 0–44)
AST: 22 U/L (ref 15–41)
Albumin: 4.4 g/dL (ref 3.5–5.0)
Alkaline Phosphatase: 63 U/L (ref 38–126)
Anion gap: 10 (ref 5–15)
BUN: 17 mg/dL (ref 8–23)
CO2: 28 mmol/L (ref 22–32)
Calcium: 9.8 mg/dL (ref 8.9–10.3)
Chloride: 98 mmol/L (ref 98–111)
Creatinine: 1.05 mg/dL (ref 0.61–1.24)
GFR, Estimated: >60 mL/min (ref >60)
Glucose, Bld: 107 mg/dL — ABNORMAL HIGH (ref 70–99)
Potassium: 4.3 mmol/L (ref 3.5–5.1)
Sodium: 137 mmol/L (ref 135–145)
Total Bilirubin: 0.7 mg/dL (ref 0.0–1.2)
Total Protein: 7.7 g/dL (ref 6.5–8.1)

## 2024-05-03 LAB — TOTAL PROTEIN, URINE DIPSTICK: Protein, ur: NEGATIVE mg/dL

## 2024-05-03 MED ORDER — SODIUM CHLORIDE 0.9 % IV SOLN
10.0000 mg/kg | Freq: Once | INTRAVENOUS | Status: AC
  Administered 2024-05-03: 1000 mg via INTRAVENOUS
  Filled 2024-05-03: qty 32

## 2024-05-03 MED ORDER — SODIUM CHLORIDE 0.9 % IV SOLN: INTRAVENOUS | Status: DC

## 2024-05-03 NOTE — Progress Notes (Signed)
 [facility] [address]  [place], [place] [address] [phone]   Interval Evaluation  Date of Service: 05/03/24 Patient Name: Randall Lewis Patient MRN: [id number] Patient DOB: Dec 05, 1955 Provider: Arthea MARLA Manns, MD  Identifying Statement:  Randall Lewis is a 68 y.o. male with right frontal glioblastoma    Oncologic History: Anaplastic oligodendroglioma of frontal lobe (CMS/HHS-HCC)  04/16/2005 Surgery  Craniotomy for resection of left frontal lesion. 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 Lost to follow up after 03/18/2010  05/02/2023 Presentation  Present[facility] to [facility]. Recommendations pending final pathology. Likely XRT/Temozolomide  as this is a high grade glioma. 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 Pathology reveals Infiltrating High Grade Glioma pending final molecular studies for integrated diagnosis.  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 (chromosomal microarray) Chromosome 10 loss with PTEN loss is detected (chromosomal microarray) 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  Daughter reports modest worsening of short term memory, disorientation at times.  No further seizures since the event in August, he continues on Keppra  1000mg  twice per day.  No neurologic changes today.  He still reports recognizing faces in people that he doesn't know.  Otherwise his walking is independent.  No headaches or seizures.    H+P (05/19/23) Patient presents for follow up after craniotomy, resection of recurrent primary brain tumor at [facility] on 04/18/23.  He tolerated surgery well, no ill effects.  He is functionally intact, no further headaches, no seizures.  Recently increased blood pressure medication with his PCP.  Has visit with radiation oncology upcoming as well.  Medications: Current Outpatient Medications on File Prior to Visit  Medication Sig Dispense Refill   acetaminophen  (TYLENOL ) 325 MG tablet Take 975 mg by mouth every 6 (six) hours as needed.     amLODipine  (NORVASC ) 5 MG tablet Take 5 mg by mouth daily.     JARDIANCE 25 MG TABS tablet Take 25 mg by mouth daily.     levETIRAcetam  (KEPPRA ) 1000 MG tablet Take 1 tablet (1,000 mg total) by mouth 2 (two) times daily. 180 tablet 3   lomustine  (GLEOSTINE ) 100 MG capsule Take  2 capsules (200 mg total) by  mouth once for 1 dose. Take on an emtpy stomach 1 hour before or 2 hours after meals. Caution:chemotherapy 2 capsule 0   lomustine  (GLEOSTINE ) 100 MG capsule Take 2 capsules (200 mg total) by mouth once for 1 dose. Take on an emtpy stomach 1 hour before or 2 hours after meals. Caution:chemotherapy 2 capsule 0   lomustine  (GLEOSTINE ) 40 MG capsule Take 5 capsules (200 mg total) by mouth once for 1 dose. Take on an empty stomach 1 hour before or 2 hours after meals. 5 capsule 0   losartan -hydrochlorothiazide  (HYZAAR) 100-12.5 MG tablet Take 1 tablet by mouth daily.     metFORMIN (GLUCOPHAGE-XR) 500 MG 24 hr tablet Take 1,000 mg by mouth daily.     metoprolol  tartrate (LOPRESSOR ) 12.5 mg TABS tablet Take 12.5 mg by mouth daily.     oxyCODONE  (OXY IR/ROXICODONE ) 5 MG immediate release tablet Take 5 mg by mouth every 4 (four) hours as needed.     pioglitazone (ACTOS) 15 MG tablet Take 15 mg by mouth daily.     predniSONE  (DELTASONE ) 50 MG tablet Take 1 tablet at 13 hours, 7 hours, and 1 hour prior to IV contrast 9 tablet 0   simvastatin  (ZOCOR ) 20 MG tablet Take 20 mg by mouth daily.     No current facility-administered medications on file prior to visit.    Allergies:  Allergies  Allergen Reactions   Sildenafil Other (See Comments) and Nausea Only   Gadolinium Derivatives Nausea And Vomiting    Per pt , always get sick with gado and never offered anti nausea meds.   Past Medical History:  Past Medical History:  Diagnosis Date   Anxiety    BPH (benign prostatic hyperplasia)    Cancer (HCC) 05/31/2004   Brain tumor, in remission after chemo radiation and surgery   Diabetes mellitus without complication (HCC)    ED (erectile dysfunction)    Heart murmur    History of heart murmur   Hyperlipidemia    Hypertension    Sleep apnea    USES C-PAP   UTI (urinary tract infection)    Past Surgical History:  Past Surgical History:  Procedure Laterality Date   BRAIN SURGERY  2006   BRAIN  TUMOR  - CANCER   COLONOSCOPY     CYSTOSCOPY N/A 07/29/2015   Procedure: FLEXIBLE CYSTOSCOPY;  Surgeon: Norleen Seltzer, MD;  Location: [facility];  Service: Urology;  Laterality: N/A;   FOOT BONE EXCISION  1989   PENILE PROSTHESIS IMPLANT N/A 07/29/2015   Procedure: PENILE PROSTHESIS;  Surgeon: Norleen Seltzer, MD;  Location: [facility];  Service: Urology;  Laterality: N/A;   right knee meniscus repair  2010   THYROID  LOBECTOMY  05/12/2012   Procedure: THYROID  LOBECTOMY;  Surgeon: Krystal CHRISTELLA Spinner, MD;  Location: [facility];  Service: General;  Laterality: Left;  left thyroid  lobectomy   Social History:  Social History   Socioeconomic History   Marital status: Married    Spouse name: [name]   Number of children: 2   Years of education: Not on file   Highest education level: Master's degree (e.g., MA, MS, MEng, MEd, MSW, MBA)  Occupational History   Not on file  Tobacco Use   Smoking status: Former    Current packs/day: 0.00    Average packs/day: 1 pack/day for 23.0 years (23.0 ttl pk-yrs)    Types: Cigarettes    Start date: 04/26/1973    Quit date: 04/26/1996  Years since quitting: 28.0   Smokeless tobacco: Never  Vaping Use   Vaping status: Never Used  Substance and Sexual Activity   Alcohol use: No   Drug use: No   Sexual activity: Not on file  Other Topics Concern   Not on file  Social History Narrative   Lives with wife   Right handed   Caffeine: 2 cups of coffee and 1 glass of tea/soda a day   Social Drivers of Corporate Investment Banker Strain: Low Risk  (05/01/2023)   Received from [facility]   Overall Financial Resource Strain (CARDIA)    Difficulty of Paying Living Expenses: Not hard at all  Food Insecurity: Low Risk  (02/20/2024)   Received from [facility]   Hunger Vital Sign    Within the past 12 months, you worried that your food would run out before you got money to buy more: Never true    Within the past 12 months, the food you bought just didn't  last and you didn't have money to get more. : Never true  Transportation Needs: No Transportation Needs (02/20/2024)   Received from [facility]    In the past 12 months, has lack of reliable transportation kept you from medical appointments, meetings, work or from getting things needed for daily living? : No  Physical Activity: Not on file  Stress: Not on file  Social Connections: Unknown (01/16/2024)   Social Connection and Isolation Panel    Frequency of Communication with Friends and Family: More than three times a week    Frequency of Social Gatherings with Friends and Family: More than three times a week    Attends Religious Services: Not on file    Active Member of Clubs or Organizations: Yes    Attends Banker Meetings: More than 4 times per year    Marital Status: Married  Catering Manager Violence: Not At Risk (01/16/2024)   Humiliation, Afraid, Rape, and Kick questionnaire    Fear of Current or Ex-Partner: No    Emotionally Abused: No    Physically Abused: No    Sexually Abused: No   Family History:  Family History  Problem Relation Age of Onset   Breast cancer Mother    Cancer Brother 60   Heart attack Brother    Heart attack Brother    Birth defects Son    Colon cancer Neg Hx    Liver disease Neg Hx    Esophageal cancer Neg Hx     Review of Systems: Constitutional: Doesn't report fevers, chills or abnormal weight loss Eyes: Doesn't report blurriness of vision Ears, nose, mouth, throat, and face: Doesn't report sore throat Respiratory: Doesn't report cough, dyspnea or wheezes Cardiovascular: Doesn't report palpitation, chest discomfort  Gastrointestinal:  Doesn't report nausea, constipation, diarrhea GU: Doesn't report incontinence Skin: Doesn't report skin rashes Neurological: Per HPI Musculoskeletal: Doesn't report joint pain Behavioral/Psych: Doesn't report anxiety  Physical Exam: Vitals:   05/03/24 1149  BP: 119/83   Pulse: 74  Resp: 18  Temp: 97.9 F (36.6 C)  SpO2: 98%   KPS: 70 General: Alert, cooperative, pleasant, in no acute distress Head: Normal EENT: No conjunctival injection or scleral icterus.  Lungs: Resp effort normal Cardiac: Regular rate Abdomen: Non-distended abdomen Skin: No rashes cyanosis or petechiae. Extremities: No clubbing or edema  Neurologic Exam: Mental Status: Awake, alert, attentive to examiner. Oriented to self and environment. Language is fluent with intact comprehension.  Cranial Nerves: Visual acuity is grossly normal. Left hemianopia. Extra-ocular movements intact. No ptosis. Face is symmetric Motor: Tone and bulk are normal. Power is full in both arms and legs. Reflexes are symmetric, no pathologic reflexes present.  Sensory: Intact to light touch Gait: Normal.   Labs: I have reviewed the data as listed    Component Value Date/Time   NA 138 04/12/2024 0943   K 4.7 04/12/2024 0943   CL 105 04/12/2024 0943   CO2 28 04/12/2024 0943   GLUCOSE 106 (H) 04/12/2024 0943   BUN 12 04/12/2024 0943   CREATININE 1.00 04/12/2024 0943   CREATININE 1.31 (H) 02/25/2012 1438   CALCIUM 9.2 04/12/2024 0943   PROT 6.7 04/12/2024 0943   ALBUMIN 3.9 04/12/2024 0943   AST 19 04/12/2024 0943   ALT 26 04/12/2024 0943   ALKPHOS 58 04/12/2024 0943   BILITOT 1.0 04/12/2024 0943   GFRNONAA >60 04/12/2024 0943   GFRNONAA >60 02/25/2012 1438   GFRAA >60 08/14/2019 1438   GFRAA >60 02/25/2012 1438   Lab Results  Component Value Date   WBC 2.6 (L) 04/12/2024   NEUTROABS 1.5 (L) 04/12/2024   HGB 16.3 04/12/2024   HCT 48.6 04/12/2024   MCV 80.3 04/12/2024   PLT 143 (L) 04/12/2024    Assessment/Plan Seizures (HCC)  Randall Lewis is clinically stable today, now day 25/42 cycle #3 CCNU/avastin .  Labs today demonstrate improvement in neutropenia and thrombocytopenia.  Patient elected to continue with cycle #3 oral CCNU 90mg /m2 q6 weeks and avastin  10mg /kg IV 2q weeks.   Avastin  will be helpful as concurrent therapy given burden of enhancing tumor and steroid requirement.  We reviewed side effects of CCNU, including fatigue, nausea vomiting, cytopenias, ILD.  We reviewed side effects of avastin , including hypertension, bleeding/clotting events, wound healing impairment.  The patient will have a complete blood count, a comprehensive metabolic panel, and urine protein performed prior to each avastin  infusion. Labs may need to be performed more often. Zofran  will prescribed for home use for nausea/vomiting.    Chemotherapy should be held for the following:  ANC less than 1,000  Platelets less than 100,000  LFT or creatinine greater than 2x ULN  If clinical concerns/contraindications develop   Avastin  should be held for the following:  ANC less than 500  Platelets less than 50,000  LFT or creatinine greater than 2x ULN  If clinical concerns/contraindications develop  Keppra  will con't 1000mg  BID.  We ask that Randall Lewis return to clinic in 2 weeks with labs prior to cycle #3, day 39/42 CCNU/avastin , or sooner as needed.    All questions were answered. The patient knows to call the clinic with any problems, questions or concerns. No barriers to learning were detected.  The total time spent in the encounter was 30 minutes and more than 50% was on counseling and review of test results   Arthea MARLA Manns, MD Medical Director of Neuro-Oncology [facility] 05/03/24 11:37 AM

## 2024-05-03 NOTE — Patient Instructions (Signed)
 [facility] - A DEPT OF [facility]  Discharge Instructions: Thank you for choosing [facility] to provide your oncology and hematology care.   If you have a lab appointment with the Cancer Center, please go directly to the Cancer Center and check in at the registration area.   Wear comfortable clothing and clothing appropriate for easy access to any Portacath or PICC line.   We strive to give you quality time with your provider. You may need to reschedule your appointment if you arrive late (15 or more minutes).  Arriving late affects you and other patients whose appointments are after yours.  Also, if you miss three or more appointments without notifying the office, you may be dismissed from the clinic at the provider's discretion.      For prescription refill requests, have your pharmacy contact our office and allow 72 hours for refills to be completed.    Today you received the following chemotherapy and/or immunotherapy agents: Bevacizumab -adcd (Vegzelma )     To help prevent nausea and vomiting after your treatment, we encourage you to take your nausea medication as directed.  BELOW ARE SYMPTOMS THAT SHOULD BE REPORTED IMMEDIATELY: *FEVER GREATER THAN 100.4 F (38 C) OR HIGHER *CHILLS OR SWEATING *NAUSEA AND VOMITING THAT IS NOT CONTROLLED WITH YOUR NAUSEA MEDICATION *UNUSUAL SHORTNESS OF BREATH *UNUSUAL BRUISING OR BLEEDING *URINARY PROBLEMS (pain or burning when urinating, or frequent urination) *BOWEL PROBLEMS (unusual diarrhea, constipation, pain near the anus) TENDERNESS IN MOUTH AND THROAT WITH OR WITHOUT PRESENCE OF ULCERS (sore throat, sores in mouth, or a toothache) UNUSUAL RASH, SWELLING OR PAIN  UNUSUAL VAGINAL DISCHARGE OR ITCHING   Items with * indicate a potential emergency and should be followed up as soon as possible or go to the Emergency Department if any problems should occur.  Please show the CHEMOTHERAPY ALERT CARD  or IMMUNOTHERAPY ALERT CARD at check-in to the Emergency Department and triage nurse.  Should you have questions after your visit or need to cancel or reschedule your appointment, please contact [facility] - A DEPT OF [facility]  Dept: [phone]  and follow the prompts.  Office hours are 8:00 a.m. to 4:30 p.m. Monday - Friday. Please note that voicemails left after 4:00 p.m. may not be returned until the following business day.  We are closed weekends and major holidays. You have access to a nurse at all times for urgent questions. Please call the main number to the clinic Dept: [phone] and follow the prompts.   For any non-urgent questions, you may also contact your provider using MyChart. We now offer e-Visits for anyone 66 and older to request care online for non-urgent symptoms. For details visit [email or web address].   Also download the MyChart app! Go to the app store, search MyChart, open the app, select [place], and log in with your MyChart username and password.

## 2024-05-04 ENCOUNTER — Encounter: Payer: Self-pay | Admitting: Internal Medicine

## 2024-05-04 NOTE — Progress Notes (Signed)
 Nutrition Assessment   Reason for Assessment: +MST   ASSESSMENT: 68 year old male with glioblastoma, IDH-wildtype. He is currently receiving oral CCNU + avastin . Patient is under the care of Dr. Buckley.   Past medical history includes HTN, PVD, DM2, bladder outlet obstruction, seizures, anemia, disequlibrium, liproprotein blood disorder, retinal hemorrhage  Met with patient in infusion. Daughter, who lives in [place], present at visit. Patient reports eating pretty good. Daughter shaking her head with patient response. This morning he had eggs, sausage, toast. Recalls hamburger helper for dinner. He usually does not eat lunch, but has snacks sometimes. Daughter says patient normally has a small bowl of cereal for breakfast and eats a small amount at dinner. Patient has not tried ONS.    Nutrition Focused Physical Exam: deferred   Medications: amlodipine , jardiance, keppra , lomustine , hyzaar, metformin, lopressor , oxycodone , actos, prednisone , zocor    Labs: glucose 107 01/16/24 A1c 6.6 - well controlled   Anthropometrics:   Height: 5'11 Weight: 207 lb 14.4 oz  UBW: 230-235 lb  BMI: 29.00  NUTRITION DIAGNOSIS: Unintended wt loss related to cancer as evidenced by poor oral intake, 3.3% wt loss in 3 weeks (214 lb on 11/13), 10% decrease from usual weight in the last 4 months (230 lb 6.4 oz on 8/21) - severe for time frame   INTERVENTION:  Discussed strategies for increasing calories and protein with smaller more frequent meals/snacks - offered ideas for balanced snacks + handout with additional snack ideas Encourage soft moist foods for ease of intake Suggest use of ONS for added calories and protein - recommend BID if tolerated (samples + coupons provided) Consider d/c of Jardiance as side effects include decreased appetite/wt loss     MONITORING, EVALUATION, GOAL: wt trends, intake    Next Visit: Tuesday December 30 during infusion

## 2024-05-14 ENCOUNTER — Other Ambulatory Visit (HOSPITAL_COMMUNITY): Payer: Self-pay

## 2024-05-17 ENCOUNTER — Inpatient Hospital Stay

## 2024-05-17 ENCOUNTER — Inpatient Hospital Stay: Admitting: Internal Medicine

## 2024-05-17 ENCOUNTER — Other Ambulatory Visit: Payer: Self-pay

## 2024-05-17 VITALS — BP 133/95 | HR 109 | Temp 97.2°F | Resp 20 | Wt 195.7 lb

## 2024-05-17 DIAGNOSIS — Z5112 Encounter for antineoplastic immunotherapy: Secondary | ICD-10-CM | POA: Diagnosis not present

## 2024-05-17 DIAGNOSIS — R569 Unspecified convulsions: Secondary | ICD-10-CM

## 2024-05-17 DIAGNOSIS — C719 Malignant neoplasm of brain, unspecified: Secondary | ICD-10-CM | POA: Diagnosis not present

## 2024-05-17 LAB — CBC WITH DIFFERENTIAL (CANCER CENTER ONLY)
Abs Immature Granulocytes: 0.01 K/uL (ref 0.00–0.07)
Basophils Absolute: 0 K/uL (ref 0.0–0.1)
Basophils Relative: 0 %
Eosinophils Absolute: 0.1 K/uL (ref 0.0–0.5)
Eosinophils Relative: 1 %
HCT: 57.5 % — ABNORMAL HIGH (ref 39.0–52.0)
Hemoglobin: 19 g/dL — ABNORMAL HIGH (ref 13.0–17.0)
Immature Granulocytes: 0 %
Lymphocytes Relative: 51 %
Lymphs Abs: 3.2 K/uL (ref 0.7–4.0)
MCH: 27.1 pg (ref 26.0–34.0)
MCHC: 33 g/dL (ref 30.0–36.0)
MCV: 82.1 fL (ref 80.0–100.0)
Monocytes Absolute: 0.5 K/uL (ref 0.1–1.0)
Monocytes Relative: 9 %
Neutro Abs: 2.4 K/uL (ref 1.7–7.7)
Neutrophils Relative %: 39 %
Platelet Count: 145 K/uL — ABNORMAL LOW (ref 150–400)
RBC: 7 MIL/uL — ABNORMAL HIGH (ref 4.22–5.81)
RDW: 17.8 % — ABNORMAL HIGH (ref 11.5–15.5)
WBC Count: 6.1 K/uL (ref 4.0–10.5)
nRBC: 0 % (ref 0.0–0.2)

## 2024-05-17 LAB — CMP (CANCER CENTER ONLY)
ALT: 15 U/L (ref 0–44)
AST: 23 U/L (ref 15–41)
Albumin: 4.7 g/dL (ref 3.5–5.0)
Alkaline Phosphatase: 66 U/L (ref 38–126)
Anion gap: 16 — ABNORMAL HIGH (ref 5–15)
BUN: 22 mg/dL (ref 8–23)
CO2: 26 mmol/L (ref 22–32)
Calcium: 10.2 mg/dL (ref 8.9–10.3)
Chloride: 96 mmol/L — ABNORMAL LOW (ref 98–111)
Creatinine: 1.3 mg/dL — ABNORMAL HIGH (ref 0.61–1.24)
GFR, Estimated: >60 mL/min (ref >60)
Glucose, Bld: 159 mg/dL — ABNORMAL HIGH (ref 70–99)
Potassium: 3.8 mmol/L (ref 3.5–5.1)
Sodium: 137 mmol/L (ref 135–145)
Total Bilirubin: 1 mg/dL (ref 0.0–1.2)
Total Protein: 8.4 g/dL — ABNORMAL HIGH (ref 6.5–8.1)

## 2024-05-17 MED ORDER — LOMUSTINE 100 MG PO CAPS
200.0000 mg | ORAL_CAPSULE | Freq: Once | ORAL | 0 refills | Status: AC
  Filled 2024-05-17: qty 2, 1d supply, fill #0
  Filled 2024-05-18: qty 2, 30d supply, fill #0

## 2024-05-17 NOTE — Progress Notes (Signed)
 [facility] at [facility] [address]  [place], [place] [address] [phone]   Interval Evaluation  Date of Service: 05/17/2024 Patient Name: Randall Lewis Patient MRN: [id number] Patient DOB: 1956-01-13 Provider: Arthea MARLA Manns, MD  Identifying Statement:  GWYNN Lewis is a 68 y.o. male with right frontal glioblastoma    Oncologic History: Anaplastic oligodendroglioma of frontal lobe (CMS/HHS-HCC)  04/16/2005 Surgery  Craniotomy for resection of left frontal lesion. 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 Lost to follow up after 03/18/2010  05/02/2023 Presentation  Presentation to the [facility] at [facility]. Recommendations pending final pathology. Likely XRT/Temozolomide  as this is a high grade glioma. 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 Pathology reveals Infiltrating High Grade Glioma pending final molecular studies for integrated diagnosis.  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 (chromosomal microarray) Chromosome 10 loss with PTEN loss is detected (chromosomal microarray) 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  No new or progressive neurologic symptoms.  Today he describes severe constipation which is leading to discomfort and difficulty sitting still.  No further seizures since the event in August, he continues on Keppra  1000mg  twice per day.  He still reports recognizing faces in people that he doesn't know.  Otherwise his walking is independent.  No headaches or seizures.    H+P (05/19/23) Patient presents for follow up after craniotomy, resection of recurrent primary brain tumor at [facility] on 04/18/23.  He tolerated surgery well, no ill effects.  He is functionally intact, no further headaches, no seizures.  Recently increased blood pressure medication with his PCP.  Has visit with radiation oncology upcoming as well.  Medications: Current Outpatient Medications on File Prior to Visit  Medication Sig Dispense Refill   acetaminophen  (TYLENOL ) 325 MG tablet Take 975 mg by mouth every 6 (six) hours as needed.     amLODipine  (NORVASC ) 5 MG tablet Take 5 mg by mouth daily.     JARDIANCE 25 MG TABS tablet Take 25 mg by mouth daily.     levETIRAcetam  (KEPPRA ) 1000 MG tablet Take 1 tablet (1,000 mg total) by mouth 2 (two) times daily. 180 tablet 3  losartan -hydrochlorothiazide   (HYZAAR) 100-12.5 MG tablet Take 1 tablet by mouth daily.     metFORMIN (GLUCOPHAGE-XR) 500 MG 24 hr tablet Take 1,000 mg by mouth daily.     metoprolol  tartrate (LOPRESSOR ) 12.5 mg TABS tablet Take 12.5 mg by mouth daily.     oxyCODONE  (OXY IR/ROXICODONE ) 5 MG immediate release tablet Take 5 mg by mouth every 4 (four) hours as needed.     pioglitazone (ACTOS) 15 MG tablet Take 15 mg by mouth daily.     predniSONE  (DELTASONE ) 50 MG tablet Take 1 tablet at 13 hours, 7 hours, and 1 hour prior to IV contrast 9 tablet 0   simvastatin  (ZOCOR ) 20 MG tablet Take 20 mg by mouth daily.     No current facility-administered medications on file prior to visit.    Allergies:  Allergies  Allergen Reactions   Sildenafil Other (See Comments) and Nausea Only   Gadolinium Derivatives Nausea And Vomiting    Per pt , always get sick with gado and never offered anti nausea meds.   Past Medical History:  Past Medical History:  Diagnosis Date   Anxiety    BPH (benign prostatic hyperplasia)    Cancer (HCC) 05/31/2004   Brain tumor, in remission after chemo radiation and surgery   Diabetes mellitus without complication (HCC)    ED (erectile dysfunction)    Heart murmur    History of heart murmur   Hyperlipidemia    Hypertension    Sleep apnea    USES C-PAP   UTI (urinary tract infection)    Past Surgical History:  Past Surgical History:  Procedure Laterality Date   BRAIN SURGERY  2006   BRAIN TUMOR  - CANCER   COLONOSCOPY     CYSTOSCOPY N/A 07/29/2015   Procedure: FLEXIBLE CYSTOSCOPY;  Surgeon: Norleen Seltzer, MD;  Location: [facility];  Service: Urology;  Laterality: N/A;   FOOT BONE EXCISION  1989   PENILE PROSTHESIS IMPLANT N/A 07/29/2015   Procedure: PENILE PROSTHESIS;  Surgeon: Norleen Seltzer, MD;  Location: [facility];  Service: Urology;  Laterality: N/A;   right knee meniscus repair  2010   THYROID  LOBECTOMY  05/12/2012   Procedure: THYROID  LOBECTOMY;  Surgeon: Krystal CHRISTELLA Spinner, MD;  Location: [facility];   Service: General;  Laterality: Left;  left thyroid  lobectomy   Social History:  Social History   Socioeconomic History   Marital status: Married    Spouse name: [name]   Number of children: 2   Years of education: Not on file   Highest education level: Master's degree (e.g., MA, MS, MEng, MEd, MSW, MBA)  Occupational History   Not on file  Tobacco Use   Smoking status: Former    Current packs/day: 0.00    Average packs/day: 1 pack/day for 23.0 years (23.0 ttl pk-yrs)    Types: Cigarettes    Start date: 04/26/1973    Quit date: 04/26/1996    Years since quitting: 28.0   Smokeless tobacco: Never  Vaping Use   Vaping status: Never Used  Substance and Sexual Activity   Alcohol use: No   Drug use: No   Sexual activity: Not on file  Other Topics Concern   Not on file  Social History Narrative   Lives with wife   Right handed   Caffeine: 2 cups of coffee and 1 glass of tea/soda a day   Social Drivers of Health   Tobacco Use: Medium Risk (04/05/2024)   Received from [facility]  Patient History    Smoking Tobacco Use: Former    Smokeless Tobacco Use: Never    Passive Exposure: Not on file  Financial Resource Strain: Low Risk  (05/01/2023)   Received from [facility]   Overall Financial Resource Strain (CARDIA)    Difficulty of Paying Living Expenses: Not hard at all  Food Insecurity: Low Risk (02/20/2024)   Received from [facility]   Epic    Within the past 12 months, you worried that your food would run out before you got money to buy more: Never true    Within the past 12 months, the food you bought just didn't last and you didn't have money to get more. : Never true  Transportation Needs: No Transportation Needs (02/20/2024)   Received from [facility]    In the past 12 months, has lack of reliable transportation kept you from medical appointments, meetings, work or from getting things needed for daily living? :  No  Physical Activity: Not on file  Stress: Not on file  Social Connections: Unknown (01/16/2024)   Social Connection and Isolation Panel    Frequency of Communication with Friends and Family: More than three times a week    Frequency of Social Gatherings with Friends and Family: More than three times a week    Attends Religious Services: Not on file    Active Member of Clubs or Organizations: Yes    Attends Banker Meetings: More than 4 times per year    Marital Status: Married  Catering Manager Violence: Not At Risk (01/16/2024)   Epic    Fear of Current or Ex-Partner: No    Emotionally Abused: No    Physically Abused: No    Sexually Abused: No  Depression (PHQ2-9): Low Risk (05/03/2024)   Depression (PHQ2-9)    PHQ-2 Score: 0  Alcohol Screen: Not on file  Housing: Low Risk (02/20/2024)   Received from [facility]   Epic    What is your living situation today?: I have a steady place to live    Think about the place you live. Do you have problems with any of the following? Choose all that apply:: None/None on this list  Utilities: Low Risk (02/20/2024)   Received from [facility]   Utilities    In the past 12 months has the electric, gas, oil, or water  company threatened to shut off services in your home? : No  Health Literacy: Not on file   Family History:  Family History  Problem Relation Age of Onset   Breast cancer Mother    Cancer Brother 59   Heart attack Brother    Heart attack Brother    Birth defects Son    Colon cancer Neg Hx    Liver disease Neg Hx    Esophageal cancer Neg Hx     Review of Systems: Constitutional: Doesn't report fevers, chills or abnormal weight loss Eyes: Doesn't report blurriness of vision Ears, nose, mouth, throat, and face: Doesn't report sore throat Respiratory: Doesn't report cough, dyspnea or wheezes Cardiovascular: Doesn't report palpitation, chest discomfort  Gastrointestinal:  Doesn't report nausea, constipation,  diarrhea GU: Doesn't report incontinence Skin: Doesn't report skin rashes Neurological: Per HPI Musculoskeletal: Doesn't report joint pain Behavioral/Psych: Doesn't report anxiety  Physical Exam: Vitals:   05/17/24 1108 05/17/24 1119  BP: (!) 148/92 (!) 133/95  Pulse: (!) 109   Resp: 20   Temp: (!) 97.2 F (36.2 C)  SpO2: 97%    KPS: 70 General: Alert, cooperative, pleasant, in no acute distress Head: Normal EENT: No conjunctival injection or scleral icterus.  Lungs: Resp effort normal Cardiac: Regular rate Abdomen: Non-distended abdomen Skin: No rashes cyanosis or petechiae. Extremities: No clubbing or edema  Neurologic Exam: Mental Status: Awake, alert, attentive to examiner. Oriented to self and environment. Language is fluent with intact comprehension.  Cranial Nerves: Visual acuity is grossly normal. Left hemianopia. Extra-ocular movements intact. No ptosis. Face is symmetric Motor: Tone and bulk are normal. Power is full in both arms and legs. Reflexes are symmetric, no pathologic reflexes present.  Sensory: Intact to light touch Gait: Normal.   Labs: I have reviewed the data as listed    Component Value Date/Time   NA 137 05/03/2024 1108   K 4.3 05/03/2024 1108   CL 98 05/03/2024 1108   CO2 28 05/03/2024 1108   GLUCOSE 107 (H) 05/03/2024 1108   BUN 17 05/03/2024 1108   CREATININE 1.05 05/03/2024 1108   CREATININE 1.31 (H) 02/25/2012 1438   CALCIUM 9.8 05/03/2024 1108   PROT 7.7 05/03/2024 1108   ALBUMIN 4.4 05/03/2024 1108   AST 22 05/03/2024 1108   ALT 14 05/03/2024 1108   ALKPHOS 63 05/03/2024 1108   BILITOT 0.7 05/03/2024 1108   GFRNONAA >60 05/03/2024 1108   GFRNONAA >60 02/25/2012 1438   GFRAA >60 08/14/2019 1438   GFRAA >60 02/25/2012 1438   Lab Results  Component Value Date   WBC 6.1 05/17/2024   NEUTROABS 2.4 05/17/2024   HGB 19.0 (H) 05/17/2024   HCT 57.5 (H) 05/17/2024   MCV 82.1 05/17/2024   PLT 145 (L) 05/17/2024     Assessment/Plan Glioblastoma, IDH-wildtype (HCC)  Seizures (HCC)  Lamar LITTIE Gosling is clinically stable today, now day 39/42 cycle #3 CCNU/avastin .  Labs today are within normal limits.  Patient elected to continue with cycle #4 oral CCNU 90mg /m2 q6 weeks and avastin  10mg /kg IV 2q weeks.  Avastin  will be helpful as concurrent therapy given burden of enhancing tumor and steroid requirement.  We reviewed side effects of CCNU, including fatigue, nausea vomiting, cytopenias, ILD.  We reviewed side effects of avastin , including hypertension, bleeding/clotting events, wound healing impairment.  The patient will have a complete blood count, a comprehensive metabolic panel, and urine protein performed prior to each avastin  infusion. Labs may need to be performed more often. Zofran  will prescribed for home use for nausea/vomiting.    He did not consent to avastin  today due to symptoms from constipation.  We recommended obtaining and dosing magnesium citrate, half a bottle.  Will reschedule avastin  for 05/29/24 as discussed.  CCNU may be dosed as early as 05/20/24.  Chemotherapy should be held for the following:  ANC less than 1,000  Platelets less than 100,000  LFT or creatinine greater than 2x ULN  If clinical concerns/contraindications develop   Avastin  should be held for the following:  ANC less than 500  Platelets less than 50,000  LFT or creatinine greater than 2x ULN  If clinical concerns/contraindications develop  Keppra  will con't 1000mg  BID.  We ask that RAD GRAMLING return to clinic in 2 weeks with labs prior to cycle #4, day 9/42 CCNU/avastin , or sooner as needed.    All questions were answered. The patient knows to call the clinic with any problems, questions or concerns. No barriers to learning were detected.  The total time spent in the encounter was 30 minutes and more than 50% was on  counseling and review of test results   Arthea MARLA Manns, MD Medical Director  of Neuro-Oncology [facility] 05/17/2024 11:25 AM

## 2024-05-18 ENCOUNTER — Other Ambulatory Visit: Payer: Self-pay

## 2024-05-18 ENCOUNTER — Other Ambulatory Visit (HOSPITAL_COMMUNITY): Payer: Self-pay

## 2024-05-20 ENCOUNTER — Other Ambulatory Visit (HOSPITAL_COMMUNITY): Payer: Self-pay

## 2024-05-21 ENCOUNTER — Other Ambulatory Visit (HOSPITAL_COMMUNITY): Payer: Self-pay

## 2024-05-21 ENCOUNTER — Other Ambulatory Visit: Payer: Self-pay

## 2024-05-21 NOTE — Progress Notes (Signed)
 Specialty Pharmacy Refill Coordination Note  Randall Lewis is a 68 y.o. male contacted today regarding refills of specialty medication(s) Lomustine  (GLEOSTINE )   Patient requested [name] at [facility] [place] date: 05/21/24   Medication will be filled on: 05/21/24

## 2024-05-27 ENCOUNTER — Encounter: Payer: Self-pay | Admitting: Internal Medicine

## 2024-05-29 ENCOUNTER — Inpatient Hospital Stay: Admitting: Internal Medicine

## 2024-05-29 ENCOUNTER — Inpatient Hospital Stay

## 2024-05-29 ENCOUNTER — Inpatient Hospital Stay: Admitting: Dietician

## 2024-05-29 VITALS — BP 110/64 | HR 76 | Temp 97.8°F | Resp 17 | Wt 199.8 lb

## 2024-05-29 DIAGNOSIS — C719 Malignant neoplasm of brain, unspecified: Secondary | ICD-10-CM

## 2024-05-29 DIAGNOSIS — R569 Unspecified convulsions: Secondary | ICD-10-CM

## 2024-05-29 DIAGNOSIS — Z5112 Encounter for antineoplastic immunotherapy: Secondary | ICD-10-CM | POA: Diagnosis not present

## 2024-05-29 LAB — CBC WITH DIFFERENTIAL (CANCER CENTER ONLY)
Abs Immature Granulocytes: 0.01 K/uL (ref 0.00–0.07)
Basophils Absolute: 0 K/uL (ref 0.0–0.1)
Basophils Relative: 0 %
Eosinophils Absolute: 0 K/uL (ref 0.0–0.5)
Eosinophils Relative: 1 %
HCT: 51.8 % (ref 39.0–52.0)
Hemoglobin: 17.3 g/dL — ABNORMAL HIGH (ref 13.0–17.0)
Immature Granulocytes: 0 %
Lymphocytes Relative: 38 %
Lymphs Abs: 1.1 K/uL (ref 0.7–4.0)
MCH: 27.2 pg (ref 26.0–34.0)
MCHC: 33.4 g/dL (ref 30.0–36.0)
MCV: 81.3 fL (ref 80.0–100.0)
Monocytes Absolute: 0.2 K/uL (ref 0.1–1.0)
Monocytes Relative: 8 %
Neutro Abs: 1.5 K/uL — ABNORMAL LOW (ref 1.7–7.7)
Neutrophils Relative %: 53 %
Platelet Count: 126 K/uL — ABNORMAL LOW (ref 150–400)
RBC: 6.37 MIL/uL — ABNORMAL HIGH (ref 4.22–5.81)
RDW: 17.4 % — ABNORMAL HIGH (ref 11.5–15.5)
WBC Count: 2.9 K/uL — ABNORMAL LOW (ref 4.0–10.5)
nRBC: 0 % (ref 0.0–0.2)

## 2024-05-29 LAB — CMP (CANCER CENTER ONLY)
ALT: 14 U/L (ref 0–44)
AST: 21 U/L (ref 15–41)
Albumin: 4.4 g/dL (ref 3.5–5.0)
Alkaline Phosphatase: 56 U/L (ref 38–126)
Anion gap: 11 (ref 5–15)
BUN: 21 mg/dL (ref 8–23)
CO2: 27 mmol/L (ref 22–32)
Calcium: 10 mg/dL (ref 8.9–10.3)
Chloride: 99 mmol/L (ref 98–111)
Creatinine: 1 mg/dL (ref 0.61–1.24)
GFR, Estimated: >60 mL/min
Glucose, Bld: 138 mg/dL — ABNORMAL HIGH (ref 70–99)
Potassium: 4.5 mmol/L (ref 3.5–5.1)
Sodium: 137 mmol/L (ref 135–145)
Total Bilirubin: 1 mg/dL (ref 0.0–1.2)
Total Protein: 7.5 g/dL (ref 6.5–8.1)

## 2024-05-29 LAB — TOTAL PROTEIN, URINE DIPSTICK: Protein, ur: NEGATIVE mg/dL

## 2024-05-29 MED ORDER — LEVETIRACETAM 1000 MG PO TABS: 1000.0000 mg | ORAL_TABLET | Freq: Two times a day (BID) | ORAL | 2 refills | Status: AC

## 2024-05-29 MED ORDER — SODIUM CHLORIDE 0.9 % IV SOLN
900.0000 mg | Freq: Once | INTRAVENOUS | Status: AC
  Administered 2024-05-29: 900 mg via INTRAVENOUS
  Filled 2024-05-29: qty 32

## 2024-05-29 MED ORDER — SODIUM CHLORIDE 0.9 % IV SOLN: INTRAVENOUS | Status: DC

## 2024-05-29 NOTE — Patient Instructions (Signed)
 [facility] - A DEPT OF [facility]  Discharge Instructions: Thank you for choosing [facility] to provide your oncology and hematology care.   If you have a lab appointment with the Cancer Center, please go directly to the Cancer Center and check in at the registration area.   Wear comfortable clothing and clothing appropriate for easy access to any Portacath or PICC line.   We strive to give you quality time with your provider. You may need to reschedule your appointment if you arrive late (15 or more minutes).  Arriving late affects you and other patients whose appointments are after yours.  Also, if you miss three or more appointments without notifying the office, you may be dismissed from the clinic at the provider's discretion.      For prescription refill requests, have your pharmacy contact our office and allow 72 hours for refills to be completed.    Today you received the following chemotherapy and/or immunotherapy agents: bevacizumab -adcd (VEGZELMA )      To help prevent nausea and vomiting after your treatment, we encourage you to take your nausea medication as directed.  BELOW ARE SYMPTOMS THAT SHOULD BE REPORTED IMMEDIATELY: *FEVER GREATER THAN 100.4 F (38 C) OR HIGHER *CHILLS OR SWEATING *NAUSEA AND VOMITING THAT IS NOT CONTROLLED WITH YOUR NAUSEA MEDICATION *UNUSUAL SHORTNESS OF BREATH *UNUSUAL BRUISING OR BLEEDING *URINARY PROBLEMS (pain or burning when urinating, or frequent urination) *BOWEL PROBLEMS (unusual diarrhea, constipation, pain near the anus) TENDERNESS IN MOUTH AND THROAT WITH OR WITHOUT PRESENCE OF ULCERS (sore throat, sores in mouth, or a toothache) UNUSUAL RASH, SWELLING OR PAIN  UNUSUAL VAGINAL DISCHARGE OR ITCHING   Items with * indicate a potential emergency and should be followed up as soon as possible or go to the Emergency Department if any problems should occur.  Please show the CHEMOTHERAPY ALERT CARD  or IMMUNOTHERAPY ALERT CARD at check-in to the Emergency Department and triage nurse.  Should you have questions after your visit or need to cancel or reschedule your appointment, please contact [facility] - A DEPT OF [facility]  Dept: [phone]  and follow the prompts.  Office hours are 8:00 a.m. to 4:30 p.m. Monday - Friday. Please note that voicemails left after 4:00 p.m. may not be returned until the following business day.  We are closed weekends and major holidays. You have access to a nurse at all times for urgent questions. Please call the main number to the clinic Dept: [phone] and follow the prompts.   For any non-urgent questions, you may also contact your provider using MyChart. We now offer e-Visits for anyone 48 and older to request care online for non-urgent symptoms. For details visit [email or web address].   Also download the MyChart app! Go to the app store, search MyChart, open the app, select [facility], and log in with your MyChart username and password.

## 2024-05-29 NOTE — Progress Notes (Signed)
 "  [facility] [address]  [place], [place] [address] [phone]   Interval Evaluation  Date of Service: 05/29/2024 Patient Name: Randall Lewis Patient MRN: [id number] Patient DOB: June 09, 1955 Provider: Arthea MARLA Manns, MD  Identifying Statement:  Randall Lewis is a 68 y.o. male with right frontal glioblastoma    Oncologic History: Anaplastic oligodendroglioma of frontal lobe (CMS/HHS-HCC)  04/16/2005 Surgery  Craniotomy for resection of left frontal lesion. 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 Lost to follow up after 03/18/2010  05/02/2023 Presentation  Presentation to the [facility] at [facility]. Recommendations pending final pathology. Likely XRT/Temozolomide  as this is a high grade glioma. 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 Pathology reveals Infiltrating High Grade Glioma pending final molecular studies for integrated diagnosis.  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 (chromosomal microarray) Chromosome 10 loss with PTEN loss is detected (chromosomal microarray) 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  No new or progressive neurologic symptoms.  Constipation symptoms have resolved.  No further seizures since the event in August, he continues on Keppra  1000mg  twice per day.  He still reports recognizing faces in people that he doesn't know.  Otherwise his walking is independent.  No headaches or seizures.    H+P (05/19/23) Patient presents for follow up after craniotomy, resection of recurrent primary brain tumor at [facility] on 04/18/23.  He tolerated surgery well, no ill effects.  He is functionally intact, no further headaches, no seizures.  Recently increased blood pressure medication with his PCP.  Has visit with radiation oncology upcoming as well.  Medications: Current Outpatient Medications on File Prior to Visit  Medication Sig Dispense Refill   acetaminophen  (TYLENOL ) 325 MG tablet Take 975 mg by mouth every 6 (six) hours as needed.     amLODipine  (NORVASC ) 5 MG tablet Take 5 mg by mouth daily.     JARDIANCE 25 MG TABS tablet Take 25 mg by mouth daily.     levETIRAcetam  (KEPPRA ) 1000 MG tablet Take 1 tablet (1,000 mg total) by mouth 2 (two) times daily. 180 tablet 3   losartan -hydrochlorothiazide  (HYZAAR) 100-12.5 MG tablet Take 1 tablet  by mouth daily.      metFORMIN (GLUCOPHAGE-XR) 500 MG 24 hr tablet Take 1,000 mg by mouth daily.     metoprolol  tartrate (LOPRESSOR ) 12.5 mg TABS tablet Take 12.5 mg by mouth daily.     oxyCODONE  (OXY IR/ROXICODONE ) 5 MG immediate release tablet Take 5 mg by mouth every 4 (four) hours as needed.     pioglitazone (ACTOS) 15 MG tablet Take 15 mg by mouth daily.     predniSONE  (DELTASONE ) 50 MG tablet Take 1 tablet at 13 hours, 7 hours, and 1 hour prior to IV contrast 9 tablet 0   simvastatin  (ZOCOR ) 20 MG tablet Take 20 mg by mouth daily.     No current facility-administered medications on file prior to visit.    Allergies:  Allergies  Allergen Reactions   Sildenafil Other (See Comments) and Nausea Only   Gadolinium Derivatives Nausea And Vomiting    Per pt , always get sick with gado and never offered anti nausea meds.   Past Medical History:  Past Medical History:  Diagnosis Date   Anxiety    BPH (benign prostatic hyperplasia)    Cancer (HCC) 05/31/2004   Brain tumor, in remission after chemo radiation and surgery   Diabetes mellitus without complication (HCC)    ED (erectile dysfunction)    Heart murmur    History of heart murmur   Hyperlipidemia    Hypertension    Sleep apnea    USES C-PAP   UTI (urinary tract infection)    Past Surgical History:  Past Surgical History:  Procedure Laterality Date   BRAIN SURGERY  2006   BRAIN TUMOR  - CANCER   COLONOSCOPY     CYSTOSCOPY N/A 07/29/2015   Procedure: FLEXIBLE CYSTOSCOPY;  Surgeon: Norleen Seltzer, MD;  Location: [facility];  Service: Urology;  Laterality: N/A;   FOOT BONE EXCISION  1989   PENILE PROSTHESIS IMPLANT N/A 07/29/2015   Procedure: PENILE PROSTHESIS;  Surgeon: Norleen Seltzer, MD;  Location: [facility];  Service: Urology;  Laterality: N/A;   right knee meniscus repair  2010   THYROID  LOBECTOMY  05/12/2012   Procedure: THYROID  LOBECTOMY;  Surgeon: Krystal CHRISTELLA Spinner, MD;  Location: [facility];  Service: General;  Laterality: Left;  left thyroid  lobectomy    Social History:  Social History   Socioeconomic History   Marital status: Married    Spouse name: [name]   Number of children: 2   Years of education: Not on file   Highest education level: Master's degree (e.g., MA, MS, MEng, MEd, MSW, MBA)  Occupational History   Not on file  Tobacco Use   Smoking status: Former    Current packs/day: 0.00    Average packs/day: 1 pack/day for 23.0 years (23.0 ttl pk-yrs)    Types: Cigarettes    Start date: 04/26/1973    Quit date: 04/26/1996    Years since quitting: 28.1   Smokeless tobacco: Never  Vaping Use   Vaping status: Never Used  Substance and Sexual Activity   Alcohol use: No   Drug use: No   Sexual activity: Not on file  Other Topics Concern   Not on file  Social History Narrative   Lives with wife   Right handed   Caffeine: 2 cups of coffee and 1 glass of tea/soda a day   Social Drivers of Health   Tobacco Use: Medium Risk (04/05/2024)   Received from [facility]   Patient History    Smoking Tobacco  Use: Former    Smokeless Tobacco Use: Never    Passive Exposure: Not on Actuary Strain: Low Risk  (05/01/2023)   Received from [facility]   Overall Financial Resource Strain (CARDIA)    Difficulty of Paying Living Expenses: Not hard at all  Food Insecurity: Low Risk (02/20/2024)   Received from [facility]   Epic    Within the past 12 months, you worried that your food would run out before you got money to buy more: Never true    Within the past 12 months, the food you bought just didn't last and you didn't have money to get more. : Never true  Transportation Needs: No Transportation Needs (02/20/2024)   Received from [facility]    In the past 12 months, has lack of reliable transportation kept you from medical appointments, meetings, work or from getting things needed for daily living? : No  Physical Activity: Not on file  Stress: Not on file   Social Connections: Unknown (01/16/2024)   Social Connection and Isolation Panel    Frequency of Communication with Friends and Family: More than three times a week    Frequency of Social Gatherings with Friends and Family: More than three times a week    Attends Religious Services: Not on file    Active Member of Clubs or Organizations: Yes    Attends Banker Meetings: More than 4 times per year    Marital Status: Married  Catering Manager Violence: Not At Risk (01/16/2024)   Epic    Fear of Current or Ex-Partner: No    Emotionally Abused: No    Physically Abused: No    Sexually Abused: No  Depression (PHQ2-9): Low Risk (05/17/2024)   Depression (PHQ2-9)    PHQ-2 Score: 0  Alcohol Screen: Not on file  Housing: Low Risk (02/20/2024)   Received from [facility]   Epic    What is your living situation today?: I have a steady place to live    Think about the place you live. Do you have problems with any of the following? Choose all that apply:: None/None on this list  Utilities: Low Risk (02/20/2024)   Received from [facility]   Utilities    In the past 12 months has the electric, gas, oil, or water  company threatened to shut off services in your home? : No  Health Literacy: Not on file   Family History:  Family History  Problem Relation Age of Onset   Breast cancer Mother    Cancer Brother 55   Heart attack Brother    Heart attack Brother    Birth defects Son    Colon cancer Neg Hx    Liver disease Neg Hx    Esophageal cancer Neg Hx     Review of Systems: Constitutional: Doesn't report fevers, chills or abnormal weight loss Eyes: Doesn't report blurriness of vision Ears, nose, mouth, throat, and face: Doesn't report sore throat Respiratory: Doesn't report cough, dyspnea or wheezes Cardiovascular: Doesn't report palpitation, chest discomfort  Gastrointestinal:  Doesn't report nausea, constipation, diarrhea GU: Doesn't report incontinence Skin: Doesn't  report skin rashes Neurological: Per HPI Musculoskeletal: Doesn't report joint pain Behavioral/Psych: Doesn't report anxiety  Physical Exam: Vitals:   05/29/24 1053  BP: 110/64  Pulse: 76  Resp: 17  Temp: 97.8 F (36.6 C)  SpO2: 98%    KPS: 70 General: Alert, cooperative, pleasant, in no acute distress Head: Normal  EENT: No conjunctival injection or scleral icterus.  Lungs: Resp effort normal Cardiac: Regular rate Abdomen: Non-distended abdomen Skin: No rashes cyanosis or petechiae. Extremities: No clubbing or edema  Neurologic Exam: Mental Status: Awake, alert, attentive to examiner. Oriented to self and environment. Language is fluent with intact comprehension.  Cranial Nerves: Visual acuity is grossly normal. Left hemianopia. Extra-ocular movements intact. No ptosis. Face is symmetric Motor: Tone and bulk are normal. Power is full in both arms and legs. Reflexes are symmetric, no pathologic reflexes present.  Sensory: Intact to light touch Gait: Normal.   Labs: I have reviewed the data as listed    Component Value Date/Time   NA 137 05/17/2024 1035   K 3.8 05/17/2024 1035   CL 96 (L) 05/17/2024 1035   CO2 26 05/17/2024 1035   GLUCOSE 159 (H) 05/17/2024 1035   BUN 22 05/17/2024 1035   CREATININE 1.30 (H) 05/17/2024 1035   CREATININE 1.31 (H) 02/25/2012 1438   CALCIUM 10.2 05/17/2024 1035   PROT 8.4 (H) 05/17/2024 1035   ALBUMIN 4.7 05/17/2024 1035   AST 23 05/17/2024 1035   ALT 15 05/17/2024 1035   ALKPHOS 66 05/17/2024 1035   BILITOT 1.0 05/17/2024 1035   GFRNONAA >60 05/17/2024 1035   GFRNONAA >60 02/25/2012 1438   GFRAA >60 08/14/2019 1438   GFRAA >60 02/25/2012 1438   Lab Results  Component Value Date   WBC 6.1 05/17/2024   NEUTROABS 2.4 05/17/2024   HGB 19.0 (H) 05/17/2024   HCT 57.5 (H) 05/17/2024   MCV 82.1 05/17/2024   PLT 145 (L) 05/17/2024    Assessment/Plan Glioblastoma, IDH-wildtype (HCC)  Seizures (HCC)  Randall Lewis is  clinically stable today, now day 8/42 cycle #4 CCNU/avastin .  Labs today remain within normal limits, with noted drop in neutrophils and platelets.  Patient elected to continue with cycle #4 oral CCNU 90mg /m2 q6 weeks and avastin  10mg /kg IV 2q weeks.  Avastin  will be helpful as concurrent therapy given burden of enhancing tumor and steroid requirement.  We reviewed side effects of CCNU, including fatigue, nausea vomiting, cytopenias, ILD.  We reviewed side effects of avastin , including hypertension, bleeding/clotting events, wound healing impairment.  The patient will have a complete blood count, a comprehensive metabolic panel, and urine protein performed prior to each avastin  infusion. Labs may need to be performed more often. Zofran  will prescribed for home use for nausea/vomiting.   Chemotherapy should be held for the following:  ANC less than 1,000  Platelets less than 100,000  LFT or creatinine greater than 2x ULN  If clinical concerns/contraindications develop   Avastin  should be held for the following:  ANC less than 500  Platelets less than 50,000  LFT or creatinine greater than 2x ULN  If clinical concerns/contraindications develop  Keppra  will con't 1000mg  BID.  We ask that Randall Lewis return to clinic in 2 weeks with labs prior to cycle #4, day 22/42 CCNU/avastin , or sooner as needed.  MRI brain will be requested for 06/21/24.  All questions were answered. The patient knows to call the clinic with any problems, questions or concerns. No barriers to learning were detected.  The total time spent in the encounter was 30 minutes and more than 50% was on counseling and review of test results   Arthea MARLA Manns, MD Medical Director of Neuro-Oncology [facility] 05/29/2024 10:51 AM "

## 2024-05-29 NOTE — Progress Notes (Addendum)
 Reduce beva dose to 900mg  based on today's wt=90.6kg and update tx plan wt basis for subsequent cycles per Dr. Buckley.  Randall Lewis, PharmD, MBA

## 2024-05-29 NOTE — Progress Notes (Signed)
 Nutrition Follow-up:  Pt with glioblastoma, IDH-wildtype. He is currently receiving oral CCNU + avastin . Patient is under the care of Dr. Buckley.   Met with patient in infusion. Wife is present at visit. Patient reports doing very well. Enjoyed the holiday with family. Appetite has been good. Says he's eaten a lot of fruit cake and gained a few pounds. Wife reports switching to the fairlife milk as suggested. He is drinking 2 (16 oz) glasses/day along with 2-3 bottles of water  and coffee. Patient denies NIS at this time.    Medications: reviewed   Labs: glucose 138  Anthropometrics: Wt 199 lb 12.8 oz today - increased   12/18 - 195 lb 11.2 oz    NUTRITION DIAGNOSIS: Unintended wt loss - stable    INTERVENTION:  Encourage high calorie high protein snacks Continue drinking fairlife milk     MONITORING, EVALUATION, GOAL: wt trends, intake   NEXT VISIT: To be scheduled as needed

## 2024-06-05 ENCOUNTER — Encounter: Payer: Self-pay | Admitting: Internal Medicine

## 2024-06-11 ENCOUNTER — Other Ambulatory Visit: Payer: Self-pay | Admitting: Internal Medicine

## 2024-06-12 ENCOUNTER — Other Ambulatory Visit: Payer: Self-pay | Admitting: Internal Medicine

## 2024-06-13 ENCOUNTER — Other Ambulatory Visit: Payer: Self-pay | Admitting: *Deleted

## 2024-06-13 ENCOUNTER — Other Ambulatory Visit: Payer: Self-pay

## 2024-06-13 DIAGNOSIS — C719 Malignant neoplasm of brain, unspecified: Secondary | ICD-10-CM

## 2024-06-14 ENCOUNTER — Inpatient Hospital Stay: Attending: Internal Medicine

## 2024-06-14 ENCOUNTER — Inpatient Hospital Stay: Admitting: Internal Medicine

## 2024-06-14 ENCOUNTER — Inpatient Hospital Stay

## 2024-06-14 ENCOUNTER — Other Ambulatory Visit: Payer: Self-pay | Admitting: Internal Medicine

## 2024-06-14 VITALS — BP 119/73 | HR 70 | Temp 97.7°F | Resp 20 | Wt 198.6 lb

## 2024-06-14 DIAGNOSIS — R011 Cardiac murmur, unspecified: Secondary | ICD-10-CM | POA: Diagnosis not present

## 2024-06-14 DIAGNOSIS — C719 Malignant neoplasm of brain, unspecified: Secondary | ICD-10-CM

## 2024-06-14 DIAGNOSIS — E785 Hyperlipidemia, unspecified: Secondary | ICD-10-CM | POA: Diagnosis not present

## 2024-06-14 DIAGNOSIS — Z7984 Long term (current) use of oral hypoglycemic drugs: Secondary | ICD-10-CM | POA: Insufficient documentation

## 2024-06-14 DIAGNOSIS — I10 Essential (primary) hypertension: Secondary | ICD-10-CM | POA: Diagnosis not present

## 2024-06-14 DIAGNOSIS — N400 Enlarged prostate without lower urinary tract symptoms: Secondary | ICD-10-CM | POA: Diagnosis not present

## 2024-06-14 DIAGNOSIS — Z79620 Long term (current) use of immunosuppressive biologic: Secondary | ICD-10-CM | POA: Diagnosis not present

## 2024-06-14 DIAGNOSIS — N529 Male erectile dysfunction, unspecified: Secondary | ICD-10-CM | POA: Diagnosis not present

## 2024-06-14 DIAGNOSIS — Z803 Family history of malignant neoplasm of breast: Secondary | ICD-10-CM | POA: Diagnosis not present

## 2024-06-14 DIAGNOSIS — Z923 Personal history of irradiation: Secondary | ICD-10-CM | POA: Insufficient documentation

## 2024-06-14 DIAGNOSIS — Z87440 Personal history of urinary (tract) infections: Secondary | ICD-10-CM | POA: Diagnosis not present

## 2024-06-14 DIAGNOSIS — Z79899 Other long term (current) drug therapy: Secondary | ICD-10-CM | POA: Insufficient documentation

## 2024-06-14 DIAGNOSIS — G4730 Sleep apnea, unspecified: Secondary | ICD-10-CM | POA: Insufficient documentation

## 2024-06-14 DIAGNOSIS — R112 Nausea with vomiting, unspecified: Secondary | ICD-10-CM | POA: Diagnosis not present

## 2024-06-14 DIAGNOSIS — C711 Malignant neoplasm of frontal lobe: Secondary | ICD-10-CM | POA: Diagnosis not present

## 2024-06-14 DIAGNOSIS — Z87891 Personal history of nicotine dependence: Secondary | ICD-10-CM | POA: Insufficient documentation

## 2024-06-14 DIAGNOSIS — E119 Type 2 diabetes mellitus without complications: Secondary | ICD-10-CM | POA: Insufficient documentation

## 2024-06-14 DIAGNOSIS — Z5112 Encounter for antineoplastic immunotherapy: Secondary | ICD-10-CM | POA: Insufficient documentation

## 2024-06-14 LAB — CBC WITH DIFFERENTIAL (CANCER CENTER ONLY)
Abs Immature Granulocytes: 0 K/uL (ref 0.00–0.07)
Basophils Absolute: 0 K/uL (ref 0.0–0.1)
Basophils Relative: 0 %
Eosinophils Absolute: 0 K/uL (ref 0.0–0.5)
Eosinophils Relative: 1 %
HCT: 49.7 % (ref 39.0–52.0)
Hemoglobin: 16.7 g/dL (ref 13.0–17.0)
Immature Granulocytes: 0 %
Lymphocytes Relative: 29 %
Lymphs Abs: 0.9 K/uL (ref 0.7–4.0)
MCH: 27.6 pg (ref 26.0–34.0)
MCHC: 33.6 g/dL (ref 30.0–36.0)
MCV: 82.3 fL (ref 80.0–100.0)
Monocytes Absolute: 0.4 K/uL (ref 0.1–1.0)
Monocytes Relative: 13 %
Neutro Abs: 1.8 K/uL (ref 1.7–7.7)
Neutrophils Relative %: 57 %
Platelet Count: 131 K/uL — ABNORMAL LOW (ref 150–400)
RBC: 6.04 MIL/uL — ABNORMAL HIGH (ref 4.22–5.81)
RDW: 17.8 % — ABNORMAL HIGH (ref 11.5–15.5)
WBC Count: 3.2 K/uL — ABNORMAL LOW (ref 4.0–10.5)
nRBC: 0 % (ref 0.0–0.2)

## 2024-06-14 LAB — CMP (CANCER CENTER ONLY)
ALT: 15 U/L (ref 0–44)
AST: 21 U/L (ref 15–41)
Albumin: 4.4 g/dL (ref 3.5–5.0)
Alkaline Phosphatase: 60 U/L (ref 38–126)
Anion gap: 13 (ref 5–15)
BUN: 23 mg/dL (ref 8–23)
CO2: 27 mmol/L (ref 22–32)
Calcium: 10 mg/dL (ref 8.9–10.3)
Chloride: 100 mmol/L (ref 98–111)
Creatinine: 1.13 mg/dL (ref 0.61–1.24)
GFR, Estimated: >60 mL/min
Glucose, Bld: 87 mg/dL (ref 70–99)
Potassium: 4.3 mmol/L (ref 3.5–5.1)
Sodium: 140 mmol/L (ref 135–145)
Total Bilirubin: 0.6 mg/dL (ref 0.0–1.2)
Total Protein: 7.5 g/dL (ref 6.5–8.1)

## 2024-06-14 LAB — TOTAL PROTEIN, URINE DIPSTICK: Protein, ur: NEGATIVE mg/dL

## 2024-06-14 MED ORDER — SODIUM CHLORIDE 0.9 % IV SOLN: INTRAVENOUS | Status: DC

## 2024-06-14 MED ORDER — SODIUM CHLORIDE 0.9 % IV SOLN
10.0000 mg/kg | Freq: Once | INTRAVENOUS | Status: AC
  Administered 2024-06-14: 900 mg via INTRAVENOUS
  Filled 2024-06-14: qty 32

## 2024-06-14 NOTE — Patient Instructions (Signed)
 [facility] - A DEPT OF [facility]  Discharge Instructions: Thank you for choosing [facility] to provide your oncology and hematology care.   If you have a lab appointment with the Cancer Center, please go directly to the Cancer Center and check in at the registration area.   Wear comfortable clothing and clothing appropriate for easy access to any Portacath or PICC line.   We strive to give you quality time with your provider. You may need to reschedule your appointment if you arrive late (15 or more minutes).  Arriving late affects you and other patients whose appointments are after yours.  Also, if you miss three or more appointments without notifying the office, you may be dismissed from the clinic at the provider's discretion.      For prescription refill requests, have your pharmacy contact our office and allow 72 hours for refills to be completed.    Today you received the following chemotherapy and/or immunotherapy agents: Bevacizumab -adcd (Vegzelma )     To help prevent nausea and vomiting after your treatment, we encourage you to take your nausea medication as directed.  BELOW ARE SYMPTOMS THAT SHOULD BE REPORTED IMMEDIATELY: *FEVER GREATER THAN 100.4 F (38 C) OR HIGHER *CHILLS OR SWEATING *NAUSEA AND VOMITING THAT IS NOT CONTROLLED WITH YOUR NAUSEA MEDICATION *UNUSUAL SHORTNESS OF BREATH *UNUSUAL BRUISING OR BLEEDING *URINARY PROBLEMS (pain or burning when urinating, or frequent urination) *BOWEL PROBLEMS (unusual diarrhea, constipation, pain near the anus) TENDERNESS IN MOUTH AND THROAT WITH OR WITHOUT PRESENCE OF ULCERS (sore throat, sores in mouth, or a toothache) UNUSUAL RASH, SWELLING OR PAIN  UNUSUAL VAGINAL DISCHARGE OR ITCHING   Items with * indicate a potential emergency and should be followed up as soon as possible or go to the Emergency Department if any problems should occur.  Please show the CHEMOTHERAPY ALERT CARD  or IMMUNOTHERAPY ALERT CARD at check-in to the Emergency Department and triage nurse.  Should you have questions after your visit or need to cancel or reschedule your appointment, please contact [facility] - A DEPT OF [facility]  Dept: [phone]  and follow the prompts.  Office hours are 8:00 a.m. to 4:30 p.m. Monday - Friday. Please note that voicemails left after 4:00 p.m. may not be returned until the following business day.  We are closed weekends and major holidays. You have access to a nurse at all times for urgent questions. Please call the main number to the clinic Dept: [phone] and follow the prompts.   For any non-urgent questions, you may also contact your provider using MyChart. We now offer e-Visits for anyone 66 and older to request care online for non-urgent symptoms. For details visit [email or web address].   Also download the MyChart app! Go to the app store, search MyChart, open the app, select [place], and log in with your MyChart username and password.

## 2024-06-14 NOTE — Progress Notes (Signed)
 "  [facility] [address]  [place], [place] [address] [phone]   Interval Evaluation  Date of Service: 06/14/24 Patient Name: Randall Lewis Patient MRN: [id number] Patient DOB: 06-25-1955 Provider: Arthea MARLA Manns, MD  Identifying Statement:  Randall Lewis is a 69 y.o. male with right frontal glioblastoma    Oncologic History: Anaplastic oligodendroglioma of frontal lobe (CMS/HHS-HCC)  04/16/2005 Surgery  Craniotomy for resection of left frontal lesion. 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 Lost to follow up after 03/18/2010  05/02/2023 Presentation  Presentation to the [facility] at [facility]. Recommendations pending final pathology. Likely XRT/Temozolomide  as this is a high grade glioma. 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 Pathology reveals Infiltrating High Grade Glioma pending final molecular studies for integrated diagnosis.  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 (chromosomal microarray) Chromosome 10 loss with PTEN loss is detected (chromosomal microarray) 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  No new or progressive neurologic symptoms reported today.  Constipation symptoms remain resolved.  No further seizures since the event in August, he continues on Keppra  1000mg  twice per day.  He still reports recognizing faces in people that he doesn't know.  Otherwise his walking is independent.  No headaches or seizures.    H+P (05/19/23) Patient presents for follow up after craniotomy, resection of recurrent primary brain tumor at [facility] on 04/18/23.  He tolerated surgery well, no ill effects.  He is functionally intact, no further headaches, no seizures.  Recently increased blood pressure medication with his PCP.  Has visit with radiation oncology upcoming as well.  Medications: Current Outpatient Medications on File Prior to Visit  Medication Sig Dispense Refill   acetaminophen  (TYLENOL ) 325 MG tablet Take 975 mg by mouth every 6 (six) hours as needed.     amLODipine  (NORVASC ) 5 MG tablet Take 5 mg by mouth daily.     JARDIANCE 25 MG TABS tablet Take 25 mg by mouth daily.     levETIRAcetam  (KEPPRA ) 1000 MG tablet Take 1 tablet (1,000 mg total) by mouth 2 (two) times daily. 60 tablet 2   losartan -hydrochlorothiazide  (HYZAAR) 100-12.5 MG tablet  Take 1 tablet by  mouth daily.     metFORMIN (GLUCOPHAGE-XR) 500 MG 24 hr tablet Take 1,000 mg by mouth daily.     metoprolol  tartrate (LOPRESSOR ) 12.5 mg TABS tablet Take 12.5 mg by mouth daily.     oxyCODONE  (OXY IR/ROXICODONE ) 5 MG immediate release tablet Take 5 mg by mouth every 4 (four) hours as needed.     pioglitazone (ACTOS) 15 MG tablet Take 15 mg by mouth daily.     predniSONE  (DELTASONE ) 50 MG tablet Take 1 tablet at 13 hours, 7 hours, and 1 hour prior to IV contrast 9 tablet 0   simvastatin  (ZOCOR ) 20 MG tablet Take 20 mg by mouth daily.     No current facility-administered medications on file prior to visit.    Allergies:  Allergies  Allergen Reactions   Sildenafil Other (See Comments) and Nausea Only   Gadolinium Derivatives Nausea And Vomiting    Per pt , always get sick with gado and never offered anti nausea meds.   Past Medical History:  Past Medical History:  Diagnosis Date   Anxiety    BPH (benign prostatic hyperplasia)    Cancer (HCC) 05/31/2004   Brain tumor, in remission after chemo radiation and surgery   Diabetes mellitus without complication (HCC)    ED (erectile dysfunction)    Heart murmur    History of heart murmur   Hyperlipidemia    Hypertension    Sleep apnea    USES C-PAP   UTI (urinary tract infection)    Past Surgical History:  Past Surgical History:  Procedure Laterality Date   BRAIN SURGERY  2006   BRAIN TUMOR  - CANCER   COLONOSCOPY     CYSTOSCOPY N/A 07/29/2015   Procedure: FLEXIBLE CYSTOSCOPY;  Surgeon: Norleen Seltzer, MD;  Location: [facility];  Service: Urology;  Laterality: N/A;   FOOT BONE EXCISION  1989   PENILE PROSTHESIS IMPLANT N/A 07/29/2015   Procedure: PENILE PROSTHESIS;  Surgeon: Norleen Seltzer, MD;  Location: [facility];  Service: Urology;  Laterality: N/A;   right knee meniscus repair  2010   THYROID  LOBECTOMY  05/12/2012   Procedure: THYROID  LOBECTOMY;  Surgeon: Krystal CHRISTELLA Spinner, MD;  Location: [facility];  Service: General;  Laterality: Left;  left  thyroid  lobectomy   Social History:  Social History   Socioeconomic History   Marital status: Married    Spouse name: [name]   Number of children: 2   Years of education: Not on file   Highest education level: Master's degree (e.g., MA, MS, MEng, MEd, MSW, MBA)  Occupational History   Not on file  Tobacco Use   Smoking status: Former    Current packs/day: 0.00    Average packs/day: 1 pack/day for 23.0 years (23.0 ttl pk-yrs)    Types: Cigarettes    Start date: 04/26/1973    Quit date: 04/26/1996    Years since quitting: 28.1   Smokeless tobacco: Never  Vaping Use   Vaping status: Never Used  Substance and Sexual Activity   Alcohol use: No   Drug use: No   Sexual activity: Not on file  Other Topics Concern   Not on file  Social History Narrative   Lives with wife   Right handed   Caffeine: 2 cups of coffee and 1 glass of tea/soda a day   Social Drivers of Health   Tobacco Use: Medium Risk (04/05/2024)   Received from [facility]   Patient History  Smoking Tobacco Use: Former    Smokeless Tobacco Use: Never    Passive Exposure: Not on file  Financial Resource Strain: Low Risk  (05/01/2023)   Received from [facility]   Overall Financial Resource Strain (CARDIA)    Difficulty of Paying Living Expenses: Not hard at all  Food Insecurity: Low Risk (02/20/2024)   Received from [facility]   Epic    Within the past 12 months, you worried that your food would run out before you got money to buy more: Never true    Within the past 12 months, the food you bought just didn't last and you didn't have money to get more. : Never true  Transportation Needs: No Transportation Needs (02/20/2024)   Received from [facility]    In the past 12 months, has lack of reliable transportation kept you from medical appointments, meetings, work or from getting things needed for daily living? : No  Physical Activity: Not on file   Stress: Not on file  Social Connections: Unknown (01/16/2024)   Social Connection and Isolation Panel    Frequency of Communication with Friends and Family: More than three times a week    Frequency of Social Gatherings with Friends and Family: More than three times a week    Attends Religious Services: Not on file    Active Member of Clubs or Organizations: Yes    Attends Banker Meetings: More than 4 times per year    Marital Status: Married  Catering Manager Violence: Not At Risk (01/16/2024)   Epic    Fear of Current or Ex-Partner: No    Emotionally Abused: No    Physically Abused: No    Sexually Abused: No  Depression (PHQ2-9): Low Risk (05/29/2024)   Depression (PHQ2-9)    PHQ-2 Score: 0  Alcohol Screen: Not on file  Housing: Low Risk (02/20/2024)   Received from [facility]   Epic    What is your living situation today?: I have a steady place to live    Think about the place you live. Do you have problems with any of the following? Choose all that apply:: None/None on this list  Utilities: Low Risk (02/20/2024)   Received from [facility]   Utilities    In the past 12 months has the electric, gas, oil, or water  company threatened to shut off services in your home? : No  Health Literacy: Not on file   Family History:  Family History  Problem Relation Age of Onset   Breast cancer Mother    Cancer Brother 41   Heart attack Brother    Heart attack Brother    Birth defects Son    Colon cancer Neg Hx    Liver disease Neg Hx    Esophageal cancer Neg Hx     Review of Systems: Constitutional: Doesn't report fevers, chills or abnormal weight loss Eyes: Doesn't report blurriness of vision Ears, nose, mouth, throat, and face: Doesn't report sore throat Respiratory: Doesn't report cough, dyspnea or wheezes Cardiovascular: Doesn't report palpitation, chest discomfort  Gastrointestinal:  Doesn't report nausea, constipation, diarrhea GU: Doesn't report  incontinence Skin: Doesn't report skin rashes Neurological: Per HPI Musculoskeletal: Doesn't report joint pain Behavioral/Psych: Doesn't report anxiety  Physical Exam: Vitals:   06/14/24 1355  BP: 119/73  Pulse: 70  Resp: 20  Temp: 97.7 F (36.5 C)  SpO2: 96%    KPS: 70 General: Alert, cooperative, pleasant, in no acute distress  Head: Normal EENT: No conjunctival injection or scleral icterus.  Lungs: Resp effort normal Cardiac: Regular rate Abdomen: Non-distended abdomen Skin: No rashes cyanosis or petechiae. Extremities: No clubbing or edema  Neurologic Exam: Mental Status: Awake, alert, attentive to examiner. Oriented to self and environment. Language is fluent with intact comprehension.  Cranial Nerves: Visual acuity is grossly normal. Left hemianopia. Extra-ocular movements intact. No ptosis. Face is symmetric Motor: Tone and bulk are normal. Power is full in both arms and legs. Reflexes are symmetric, no pathologic reflexes present.  Sensory: Intact to light touch Gait: Normal.   Labs: I have reviewed the data as listed    Component Value Date/Time   NA 140 06/14/2024 1325   K 4.3 06/14/2024 1325   CL 100 06/14/2024 1325   CO2 27 06/14/2024 1325   GLUCOSE 87 06/14/2024 1325   BUN 23 06/14/2024 1325   CREATININE 1.13 06/14/2024 1325   CREATININE 1.31 (H) 02/25/2012 1438   CALCIUM 10.0 06/14/2024 1325   PROT 7.5 06/14/2024 1325   ALBUMIN 4.4 06/14/2024 1325   AST 21 06/14/2024 1325   ALT 15 06/14/2024 1325   ALKPHOS 60 06/14/2024 1325   BILITOT 0.6 06/14/2024 1325   GFRNONAA >60 06/14/2024 1325   GFRNONAA >60 02/25/2012 1438   GFRAA >60 08/14/2019 1438   GFRAA >60 02/25/2012 1438   Lab Results  Component Value Date   WBC 3.2 (L) 06/14/2024   NEUTROABS 1.8 06/14/2024   HGB 16.7 06/14/2024   HCT 49.7 06/14/2024   MCV 82.3 06/14/2024   PLT 131 (L) 06/14/2024    Assessment/Plan Glioblastoma, IDH-wildtype (HCC)  Lamar LITTIE Gosling is clinically  stable today, now day 25/42 cycle #4 CCNU/avastin .  Labs today remain within normal limits, with slight uptick in both neutrophils and platelets.  Patient elected to continue with cycle #4 oral CCNU 90mg /m2 q6 weeks and avastin  10mg /kg IV 2q weeks.  Avastin  will be helpful as concurrent therapy given burden of enhancing tumor and steroid requirement.  We reviewed side effects of CCNU, including fatigue, nausea vomiting, cytopenias, ILD.  We reviewed side effects of avastin , including hypertension, bleeding/clotting events, wound healing impairment.  The patient will have a complete blood count, a comprehensive metabolic panel, and urine protein performed prior to each avastin  infusion. Labs may need to be performed more often. Zofran  will prescribed for home use for nausea/vomiting.   Chemotherapy should be held for the following:  ANC less than 1,000  Platelets less than 100,000  LFT or creatinine greater than 2x ULN  If clinical concerns/contraindications develop   Avastin  should be held for the following:  ANC less than 500  Platelets less than 50,000  LFT or creatinine greater than 2x ULN  If clinical concerns/contraindications develop  Keppra  will con't 1000mg  BID.  We ask that Lamar LITTIE Gosling return to clinic in 2 weeks with MRI following cycle #4 CCNU/avastin , or sooner as needed.  MRI brain scheduled for 06/21/24.  Visit with Dr. Rosina also scheduled for morning of 06/28/24.  All questions were answered. The patient knows to call the clinic with any problems, questions or concerns. No barriers to learning were detected.  The total time spent in the encounter was 30 minutes and more than 50% was on counseling and review of test results   Arthea MARLA Manns, MD Medical Director of Neuro-Oncology [facility] 06/14/24 2:09 PM "

## 2024-06-15 ENCOUNTER — Other Ambulatory Visit (HOSPITAL_COMMUNITY): Payer: Self-pay

## 2024-06-15 ENCOUNTER — Other Ambulatory Visit: Payer: Self-pay

## 2024-06-20 ENCOUNTER — Encounter: Payer: Self-pay | Admitting: Internal Medicine

## 2024-06-21 ENCOUNTER — Ambulatory Visit (HOSPITAL_COMMUNITY)
Admission: RE | Admit: 2024-06-21 | Discharge: 2024-06-21 | Disposition: A | Discharge status: Home / Self Care | Source: Ambulatory Visit | Attending: Internal Medicine | Admitting: Internal Medicine

## 2024-06-21 DIAGNOSIS — C719 Malignant neoplasm of brain, unspecified: Secondary | ICD-10-CM | POA: Insufficient documentation

## 2024-06-21 MED ORDER — GADOBUTROL 1 MMOL/ML IV SOLN
9.0000 mL | Freq: Once | INTRAVENOUS | Status: AC | PRN
  Administered 2024-06-21: 9 mL via INTRAVENOUS

## 2024-06-28 ENCOUNTER — Inpatient Hospital Stay

## 2024-06-28 ENCOUNTER — Inpatient Hospital Stay: Admitting: Internal Medicine

## 2024-06-28 ENCOUNTER — Other Ambulatory Visit: Payer: Self-pay

## 2024-06-28 VITALS — BP 110/67 | HR 63 | Resp 18

## 2024-06-28 VITALS — BP 107/73 | HR 79 | Temp 97.5°F | Resp 20 | Wt 197.9 lb

## 2024-06-28 DIAGNOSIS — C719 Malignant neoplasm of brain, unspecified: Secondary | ICD-10-CM | POA: Diagnosis not present

## 2024-06-28 DIAGNOSIS — R569 Unspecified convulsions: Secondary | ICD-10-CM

## 2024-06-28 DIAGNOSIS — Z5112 Encounter for antineoplastic immunotherapy: Secondary | ICD-10-CM | POA: Diagnosis not present

## 2024-06-28 LAB — CBC WITH DIFFERENTIAL (CANCER CENTER ONLY)
Abs Immature Granulocytes: 0.01 10*3/uL (ref 0.00–0.07)
Basophils Absolute: 0 10*3/uL (ref 0.0–0.1)
Basophils Relative: 1 %
Eosinophils Absolute: 0.1 10*3/uL (ref 0.0–0.5)
Eosinophils Relative: 2 %
HCT: 50.3 % (ref 39.0–52.0)
Hemoglobin: 16.7 g/dL (ref 13.0–17.0)
Immature Granulocytes: 0 %
Lymphocytes Relative: 24 %
Lymphs Abs: 0.9 10*3/uL (ref 0.7–4.0)
MCH: 27.7 pg (ref 26.0–34.0)
MCHC: 33.2 g/dL (ref 30.0–36.0)
MCV: 83.6 fL (ref 80.0–100.0)
Monocytes Absolute: 0.4 10*3/uL (ref 0.1–1.0)
Monocytes Relative: 12 %
Neutro Abs: 2.3 10*3/uL (ref 1.7–7.7)
Neutrophils Relative %: 61 %
Platelet Count: 129 10*3/uL — ABNORMAL LOW (ref 150–400)
RBC: 6.02 MIL/uL — ABNORMAL HIGH (ref 4.22–5.81)
RDW: 17.6 % — ABNORMAL HIGH (ref 11.5–15.5)
WBC Count: 3.7 10*3/uL — ABNORMAL LOW (ref 4.0–10.5)
nRBC: 0 % (ref 0.0–0.2)

## 2024-06-28 LAB — CMP (CANCER CENTER ONLY)
ALT: 14 U/L (ref 0–44)
AST: 18 U/L (ref 15–41)
Albumin: 4.4 g/dL (ref 3.5–5.0)
Alkaline Phosphatase: 60 U/L (ref 38–126)
Anion gap: 13 (ref 5–15)
BUN: 21 mg/dL (ref 8–23)
CO2: 26 mmol/L (ref 22–32)
Calcium: 9.7 mg/dL (ref 8.9–10.3)
Chloride: 99 mmol/L (ref 98–111)
Creatinine: 1.06 mg/dL (ref 0.61–1.24)
GFR, Estimated: >60 mL/min
Glucose, Bld: 87 mg/dL (ref 70–99)
Potassium: 4.1 mmol/L (ref 3.5–5.1)
Sodium: 138 mmol/L (ref 135–145)
Total Bilirubin: 0.5 mg/dL (ref 0.0–1.2)
Total Protein: 7.4 g/dL (ref 6.5–8.1)

## 2024-06-28 LAB — TOTAL PROTEIN, URINE DIPSTICK: Protein, ur: NEGATIVE mg/dL

## 2024-06-28 MED ORDER — SODIUM CHLORIDE 0.9 % IV SOLN
10.0000 mg/kg | Freq: Once | INTRAVENOUS | Status: AC
  Administered 2024-06-28: 900 mg via INTRAVENOUS
  Filled 2024-06-28: qty 32

## 2024-06-28 MED ORDER — SODIUM CHLORIDE 0.9 % IV SOLN: INTRAVENOUS | Status: DC

## 2024-06-28 NOTE — Patient Instructions (Signed)
 [facility] - A DEPT OF [facility]  Discharge Instructions: Thank you for choosing [facility] to provide your oncology and hematology care.   If you have a lab appointment with the Cancer Center, please go directly to the Cancer Center and check in at the registration area.   Wear comfortable clothing and clothing appropriate for easy access to any Portacath or PICC line.   We strive to give you quality time with your provider. You may need to reschedule your appointment if you arrive late (15 or more minutes).  Arriving late affects you and other patients whose appointments are after yours.  Also, if you miss three or more appointments without notifying the office, you may be dismissed from the clinic at the provider's discretion.      For prescription refill requests, have your pharmacy contact our office and allow 72 hours for refills to be completed.    Today you received the following chemotherapy and/or immunotherapy agents: Bevacizumab -adcd (Vegzelma )     To help prevent nausea and vomiting after your treatment, we encourage you to take your nausea medication as directed.  BELOW ARE SYMPTOMS THAT SHOULD BE REPORTED IMMEDIATELY: *FEVER GREATER THAN 100.4 F (38 C) OR HIGHER *CHILLS OR SWEATING *NAUSEA AND VOMITING THAT IS NOT CONTROLLED WITH YOUR NAUSEA MEDICATION *UNUSUAL SHORTNESS OF BREATH *UNUSUAL BRUISING OR BLEEDING *URINARY PROBLEMS (pain or burning when urinating, or frequent urination) *BOWEL PROBLEMS (unusual diarrhea, constipation, pain near the anus) TENDERNESS IN MOUTH AND THROAT WITH OR WITHOUT PRESENCE OF ULCERS (sore throat, sores in mouth, or a toothache) UNUSUAL RASH, SWELLING OR PAIN  UNUSUAL VAGINAL DISCHARGE OR ITCHING   Items with * indicate a potential emergency and should be followed up as soon as possible or go to the Emergency Department if any problems should occur.  Please show the CHEMOTHERAPY ALERT CARD  or IMMUNOTHERAPY ALERT CARD at check-in to the Emergency Department and triage nurse.  Should you have questions after your visit or need to cancel or reschedule your appointment, please contact [facility] - A DEPT OF [facility]  Dept: [phone]  and follow the prompts.  Office hours are 8:00 a.m. to 4:30 p.m. Monday - Friday. Please note that voicemails left after 4:00 p.m. may not be returned until the following business day.  We are closed weekends and major holidays. You have access to a nurse at all times for urgent questions. Please call the main number to the clinic Dept: [phone] and follow the prompts.   For any non-urgent questions, you may also contact your provider using MyChart. We now offer e-Visits for anyone 66 and older to request care online for non-urgent symptoms. For details visit [email or web address].   Also download the MyChart app! Go to the app store, search MyChart, open the app, select [place], and log in with your MyChart username and password.

## 2024-06-28 NOTE — Progress Notes (Signed)
 "  [facility] [address]  [place], [place] [address] [phone]   Interval Evaluation  Date of Service: 06/28/24 Patient Name: Randall Lewis Patient MRN: [id number] Patient DOB: 22-Mar-1956 Provider: Arthea MARLA Manns, MD  Identifying Statement:  Randall Lewis is a 69 y.o. male with right frontal glioblastoma    Oncologic History: Anaplastic oligodendroglioma of frontal lobe (CMS/HHS-HCC)  04/16/2005 Surgery  Craniotomy for resection of left frontal lesion. 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 Lost to follow up after 03/18/2010  05/02/2023 Presentation  Presentation to the [facility] at [facility]. Recommendations pending final pathology. Likely XRT/Temozolomide  as this is a high grade glioma. 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 Pathology reveals Infiltrating High Grade Glioma pending final molecular studies for integrated diagnosis.  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 (chromosomal microarray) Chromosome 10 loss with PTEN loss is detected (chromosomal microarray) 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  No new or progressive neurologic symptoms reported today.  Constipation symptoms remain resolved.  No further seizures since the event in August, he continues on Keppra  1000mg  twice per day.  Had visit with Dr. Rosina and his [facility] team earlier today.  Otherwise his walking is independent.  No headaches or seizures.    H+P (05/19/23) Patient presents for follow up after craniotomy, resection of recurrent primary brain tumor at [facility] on 04/18/23.  He tolerated surgery well, no ill effects.  He is functionally intact, no further headaches, no seizures.  Recently increased blood pressure medication with his PCP.  Has visit with radiation oncology upcoming as well.  Medications: Current Outpatient Medications on File Prior to Visit  Medication Sig Dispense Refill   acetaminophen  (TYLENOL ) 325 MG tablet Take 975 mg by mouth every 6 (six) hours as needed.     amLODipine  (NORVASC ) 5 MG tablet Take 5 mg by mouth daily.     JARDIANCE 25 MG TABS tablet Take 25 mg by mouth daily.     levETIRAcetam  (KEPPRA ) 1000 MG tablet Take 1 tablet (1,000 mg total) by mouth 2 (two) times daily. 60 tablet 2   losartan -hydrochlorothiazide  (HYZAAR) 100-12.5  MG tablet Take 1  tablet by mouth daily.     metFORMIN (GLUCOPHAGE-XR) 500 MG 24 hr tablet Take 1,000 mg by mouth daily.     metoprolol  tartrate (LOPRESSOR ) 12.5 mg TABS tablet Take 12.5 mg by mouth daily.     oxyCODONE  (OXY IR/ROXICODONE ) 5 MG immediate release tablet Take 5 mg by mouth every 4 (four) hours as needed.     pioglitazone (ACTOS) 15 MG tablet Take 15 mg by mouth daily.     predniSONE  (DELTASONE ) 50 MG tablet Take 1 tablet at 13 hours, 7 hours, and 1 hour prior to IV contrast 9 tablet 0   simvastatin  (ZOCOR ) 20 MG tablet Take 20 mg by mouth daily.     No current facility-administered medications on file prior to visit.    Allergies:  Allergies  Allergen Reactions   Sildenafil Other (See Comments) and Nausea Only   Gadolinium Derivatives Nausea And Vomiting    Per pt , always get sick with gado and never offered anti nausea meds.   Past Medical History:  Past Medical History:  Diagnosis Date   Anxiety    BPH (benign prostatic hyperplasia)    Cancer (HCC) 05/31/2004   Brain tumor, in remission after chemo radiation and surgery   Diabetes mellitus without complication (HCC)    ED (erectile dysfunction)    Heart murmur    History of heart murmur   Hyperlipidemia    Hypertension    Sleep apnea    USES C-PAP   UTI (urinary tract infection)    Past Surgical History:  Past Surgical History:  Procedure Laterality Date   BRAIN SURGERY  2006   BRAIN TUMOR  - CANCER   COLONOSCOPY     CYSTOSCOPY N/A 07/29/2015   Procedure: FLEXIBLE CYSTOSCOPY;  Surgeon: Norleen Seltzer, MD;  Location: [facility];  Service: Urology;  Laterality: N/A;   FOOT BONE EXCISION  1989   PENILE PROSTHESIS IMPLANT N/A 07/29/2015   Procedure: PENILE PROSTHESIS;  Surgeon: Norleen Seltzer, MD;  Location: [facility];  Service: Urology;  Laterality: N/A;   right knee meniscus repair  2010   THYROID  LOBECTOMY  05/12/2012   Procedure: THYROID  LOBECTOMY;  Surgeon: Krystal CHRISTELLA Spinner, MD;  Location: [facility];  Service: General;  Laterality: Left;   left thyroid  lobectomy   Social History:  Social History   Socioeconomic History   Marital status: Married    Spouse name: [name]   Number of children: 2   Years of education: Not on file   Highest education level: Master's degree (e.g., MA, MS, MEng, MEd, MSW, MBA)  Occupational History   Not on file  Tobacco Use   Smoking status: Former    Current packs/day: 0.00    Average packs/day: 1 pack/day for 23.0 years (23.0 ttl pk-yrs)    Types: Cigarettes    Start date: 04/26/1973    Quit date: 04/26/1996    Years since quitting: 28.1   Smokeless tobacco: Never  Vaping Use   Vaping status: Never Used  Substance and Sexual Activity   Alcohol use: No   Drug use: No   Sexual activity: Not on file  Other Topics Concern   Not on file  Social History Narrative   Lives with wife   Right handed   Caffeine: 2 cups of coffee and 1 glass of tea/soda a day   Social Drivers of Health   Tobacco Use: Medium Risk (06/28/2024)   Received from [facility]   Patient History  Smoking Tobacco Use: Former    Smokeless Tobacco Use: Never    Passive Exposure: Not on file  Financial Resource Strain: Low Risk  (05/01/2023)   Received [facility]   Overall Financial Resource Strain (CARDIA)    Difficulty of Paying Living Expenses: Not hard at all  Food Insecurity: Low Risk (02/20/2024)   Received from [facility]   Epic    Within the past 12 months, you worried that your food would run out before you got money to buy more: Never true    Within the past 12 months, the food you bought just didn't last and you didn't have money to get more. : Never true  Transportation Needs: No Transportation Needs (02/20/2024)   Received from [facility]    In the past 12 months, has lack of reliable transportation kept you from medical appointments, meetings, work or from getting things needed for daily living? : No  Physical Activity: Not on file   Stress: Not on file  Social Connections: Unknown (01/16/2024)   Social Connection and Isolation Panel    Frequency of Communication with Friends and Family: More than three times a week    Frequency of Social Gatherings with Friends and Family: More than three times a week    Attends Religious Services: Not on file    Active Member of Clubs or Organizations: Yes    Attends Banker Meetings: More than 4 times per year    Marital Status: Married  Catering Manager Violence: Not At Risk (01/16/2024)   Epic    Fear of Current or Ex-Partner: No    Emotionally Abused: No    Physically Abused: No    Sexually Abused: No  Depression (PHQ2-9): Low Risk (06/14/2024)   Depression (PHQ2-9)    PHQ-2 Score: 0  Alcohol Screen: Not on file  Housing: Low Risk (02/20/2024)   Received from [facility]   Epic    What is your living situation today?: I have a steady place to live    Think about the place you live. Do you have problems with any of the following? Choose all that apply:: None/None on this list  Utilities: Low Risk (02/20/2024)   Received from [facility]   Utilities    In the past 12 months has the electric, gas, oil, or water  company threatened to shut off services in your home? : No  Health Literacy: Not on file   Family History:  Family History  Problem Relation Age of Onset   Breast cancer Mother    Cancer Brother 41   Heart attack Brother    Heart attack Brother    Birth defects Son    Colon cancer Neg Hx    Liver disease Neg Hx    Esophageal cancer Neg Hx     Review of Systems: Constitutional: Doesn't report fevers, chills or abnormal weight loss Eyes: Doesn't report blurriness of vision Ears, nose, mouth, throat, and face: Doesn't report sore throat Respiratory: Doesn't report cough, dyspnea or wheezes Cardiovascular: Doesn't report palpitation, chest discomfort  Gastrointestinal:  Doesn't report nausea, constipation, diarrhea GU: Doesn't report  incontinence Skin: Doesn't report skin rashes Neurological: Per HPI Musculoskeletal: Doesn't report joint pain Behavioral/Psych: Doesn't report anxiety  Physical Exam: There were no vitals filed for this visit. KPS: 70 General: Alert, cooperative, pleasant, in no acute distress Head: Normal EENT: No conjunctival injection or scleral icterus.  Lungs: Resp effort normal Cardiac: Regular rate Abdomen:  Non-distended abdomen Skin: No rashes cyanosis or petechiae. Extremities: No clubbing or edema  Neurologic Exam: Mental Status: Awake, alert, attentive to examiner. Oriented to self and environment. Language is fluent with intact comprehension.  Cranial Nerves: Visual acuity is grossly normal. Left hemianopia. Extra-ocular movements intact. No ptosis. Face is symmetric Motor: Tone and bulk are normal. Power is full in both arms and legs. Reflexes are symmetric, no pathologic reflexes present.  Sensory: Intact to light touch Gait: Normal.   Labs: I have reviewed the data as listed    Component Value Date/Time   NA 140 06/14/2024 1325   K 4.3 06/14/2024 1325   CL 100 06/14/2024 1325   CO2 27 06/14/2024 1325   GLUCOSE 87 06/14/2024 1325   BUN 23 06/14/2024 1325   CREATININE 1.13 06/14/2024 1325   CREATININE 1.31 (H) 02/25/2012 1438   CALCIUM 10.0 06/14/2024 1325   PROT 7.5 06/14/2024 1325   ALBUMIN 4.4 06/14/2024 1325   AST 21 06/14/2024 1325   ALT 15 06/14/2024 1325   ALKPHOS 60 06/14/2024 1325   BILITOT 0.6 06/14/2024 1325   GFRNONAA >60 06/14/2024 1325   GFRNONAA >60 02/25/2012 1438   GFRAA >60 08/14/2019 1438   GFRAA >60 02/25/2012 1438   Lab Results  Component Value Date   WBC 3.2 (L) 06/14/2024   NEUTROABS 1.8 06/14/2024   HGB 16.7 06/14/2024   HCT 49.7 06/14/2024   MCV 82.3 06/14/2024   PLT 131 (L) 06/14/2024   Imaging:  [facility] Clinician Interpretation: I have personally reviewed the CNS images as listed.  My interpretation, in the context of the patient's  clinical presentation, is progressive disease  MR BRAIN W WO CONTRAST Result Date: 06/21/2024 EXAM: MRI BRAIN WITH AND WITHOUT CONTRAST 06/21/2024 10:04:58 AM TECHNIQUE: Multiplanar multisequence MRI of the head/brain was performed with and without the administration of intravenous contrast. CONTRAST: 9 mL of Gadavist . COMPARISON: MR Head without then with IV contrast 03/28/2024. CLINICAL HISTORY: Brain/CNS neoplasm, assess treatment response. FINDINGS: BRAIN AND VENTRICLES: Redemonstrated necrotic lesion centered in the medial aspect of the right occipital lobe and posterior right temporal lobe. There is increased component of tumor involving the right temporal periventricular white matter (series 5, image 28). Additional increased involvement of the splenium of the corpus callosum now extending closer to the midline (series 5, image 33 of the current study as compared to series 5, image 29 of the prior study). The mass demonstrates similar areas of restricted diffusion and FLAIR hyperintensity. There is increased susceptibility along the margin of the mass. Surrounding T2 and FLAIR hyperintensity is slightly increased from prior, particularly in the right parietal lobe and posterior right frontal lobe (comparison of series 9, images 38 through 44 of the current study compared to series 9, image 34 through 40 of the prior study). Additional resection cavity in the left frontal lobe is similar in appearance with similar surrounding FLAIR hyperintensity. Similar scattered areas of susceptibility throughout the left frontal lobe as well as within the parasagittal right frontal lobe and anterior left temporal lobe. Similar supratentorial white matter signal abnormalities suggestive of chronic microvascular ischemic changes versus post treatment changes. Mild generalized parenchymal volume loss. No acute infarct. No acute intracranial hemorrhage. No hydrocephalus. Normal flow voids. ORBITS: No significant  abnormality. SINUSES: Mild mucosal thickening in the ethmoid sinuses and right greater than left maxillary sinuses. BONES AND SOFT TISSUES: Left frontal craniotomy. Additional right parietotemporal craniotomy. Normal bone marrow signal. Similar small left mastoid effusion. No soft tissue abnormality. IMPRESSION: 1.  Progression of tumor with increased involvement of the right temporal periventricular white matter and splenium of the corpus callosum. Increased surrounding T2/FLAIR hyperintensity particularly in the right parietal lobe and posterior right frontal lobe. 2. No midline shift. Electronically signed by: Donnice Mania MD 06/21/2024 01:10 PM EST RP Workstation: [id number]    Assessment/Plan Glioblastoma, IDH-wildtype (HCC)  Seizures (HCC)  Lamar LITTIE Gosling is clinically stable today, now having completed cycle #4 CCNU/avastin .  MRI brain demonstrates changes consistent with tumor progression.    Per [facility] team recommendations, CCNU will be stopped due to progressive disease.  We discussed and recommended he begin carboplatin AUC 4 IV q 4 weeks with Avastin  10mg /kg IV q2 weeks.   Patient will be pre-medicated for Carboplatin with Dexamethasone , anti-emetics for acute and delayed nausea. Zofran  will be provided home use. Will monitor with a CBC and CMP prior to chemotherapy and a CBC, CMP, UA and blood pressure prior to bevacizumab .    We recommend that chemotherapy be held for the following: ANC less than 1,000 Platelets less than 100,000 LFT or creatinine greater than 2x ULN If clinical concerns/contraindications develop   We recommend that bevacizumab  be held for the following: ANC less than 500 Platelets less than 50,000 LFT or creatinine greater than 2x ULN Urine protein level greater than or equal to 4, as Avastin  can cause nephrotic syndrome.  If there are clinical concerns/contraindications at time of infusion   Keppra  will con't 1000mg  BID.  We ask that Lamar LITTIE Gosling return  to clinic in 2 weeks with labs prior to initiation of cycle #1 carboplatin/avastin , or sooner as needed.    All questions were answered. The patient knows to call the clinic with any problems, questions or concerns. No barriers to learning were detected.  The total time spent in the encounter was 40 minutes and more than 50% was on counseling and review of test results   Arthea MARLA Manns, MD Medical Director of Neuro-Oncology [facility] 06/28/24 1:29 PM "

## 2024-06-29 ENCOUNTER — Other Ambulatory Visit: Payer: Self-pay

## 2024-07-02 ENCOUNTER — Other Ambulatory Visit (HOSPITAL_COMMUNITY): Payer: Self-pay

## 2024-07-03 ENCOUNTER — Other Ambulatory Visit: Payer: Self-pay | Admitting: Internal Medicine

## 2024-07-03 ENCOUNTER — Other Ambulatory Visit: Payer: Self-pay

## 2024-07-03 DIAGNOSIS — C719 Malignant neoplasm of brain, unspecified: Secondary | ICD-10-CM

## 2024-07-03 MED ORDER — PROCHLORPERAZINE MALEATE 10 MG PO TABS: 10.0000 mg | ORAL_TABLET | Freq: Four times a day (QID) | ORAL | 1 refills | Status: AC | PRN

## 2024-07-03 MED ORDER — ONDANSETRON HCL 8 MG PO TABS: 8.0000 mg | ORAL_TABLET | Freq: Three times a day (TID) | ORAL | 1 refills | Status: AC | PRN

## 2024-07-12 ENCOUNTER — Inpatient Hospital Stay

## 2024-07-12 ENCOUNTER — Inpatient Hospital Stay: Admitting: Internal Medicine

## 2024-09-06 ENCOUNTER — Ambulatory Visit: Admitting: Neurology
